# Patient Record
Sex: Female | Born: 1945 | Race: Black or African American | Hispanic: No | Marital: Married | Smoking: Never smoker
Health system: Southern US, Community
[De-identification: ages and names within clinical notes are randomized; demographics above are authoritative.]

## PROBLEM LIST (undated history)

## (undated) DIAGNOSIS — E119 Type 2 diabetes mellitus without complications: Secondary | ICD-10-CM

## (undated) DIAGNOSIS — E079 Disorder of thyroid, unspecified: Secondary | ICD-10-CM

## (undated) DIAGNOSIS — I10 Essential (primary) hypertension: Secondary | ICD-10-CM

## (undated) HISTORY — PX: CHOLECYSTECTOMY: SHX55

---

## 1999-08-18 ENCOUNTER — Encounter: Admission: RE | Admit: 1999-08-18 | Discharge: 1999-11-16 | Payer: Self-pay | Admitting: *Deleted

## 2001-02-07 ENCOUNTER — Other Ambulatory Visit: Admission: RE | Admit: 2001-02-07 | Discharge: 2001-02-07 | Payer: Self-pay | Admitting: Internal Medicine

## 2002-02-06 ENCOUNTER — Other Ambulatory Visit: Admission: RE | Admit: 2002-02-06 | Discharge: 2002-02-06 | Payer: Self-pay | Admitting: Internal Medicine

## 2003-06-13 ENCOUNTER — Ambulatory Visit (HOSPITAL_COMMUNITY): Admission: RE | Admit: 2003-06-13 | Discharge: 2003-06-13 | Payer: Self-pay | Admitting: Gastroenterology

## 2006-02-10 ENCOUNTER — Encounter: Admission: RE | Admit: 2006-02-10 | Discharge: 2006-02-10 | Payer: Self-pay | Admitting: Gastroenterology

## 2006-02-26 ENCOUNTER — Ambulatory Visit (HOSPITAL_COMMUNITY): Admission: RE | Admit: 2006-02-26 | Discharge: 2006-02-27 | Payer: Self-pay | Admitting: General Surgery

## 2006-02-26 ENCOUNTER — Encounter (INDEPENDENT_AMBULATORY_CARE_PROVIDER_SITE_OTHER): Payer: Self-pay | Admitting: *Deleted

## 2008-07-03 ENCOUNTER — Encounter (HOSPITAL_COMMUNITY): Admission: RE | Admit: 2008-07-03 | Discharge: 2008-08-02 | Payer: Self-pay | Admitting: Endocrinology

## 2008-07-10 ENCOUNTER — Ambulatory Visit (HOSPITAL_COMMUNITY): Admission: RE | Admit: 2008-07-10 | Discharge: 2008-07-10 | Payer: Self-pay | Admitting: Endocrinology

## 2011-04-03 NOTE — Op Note (Signed)
Catherine [name], [name]             ACCOUNT NO.:  [id number]   MEDICAL RECORD NO.:  [id number]          PATIENT TYPE:  OIB   LOCATION:  5731                         FACILITY:  [facility]   PHYSICIAN:  Ollen Gross. Vernell Morgans, M.D. DATE OF BIRTH:  Jun 13, 1946   DATE OF PROCEDURE:  02/26/2006  DATE OF DISCHARGE:  02/27/2006                                 OPERATIVE REPORT   PREOPERATIVE DIAGNOSIS:  Gallstones.   POSTOPERATIVE DIAGNOSIS:  Gallstones.   PROCEDURE:  Laparoscopic cholecystectomy with intraoperative cholangiogram.   SURGEON:  Ollen Gross. Carolynne Edouard, M.D.   ASSISTANT:  Leonie Man, M.D.   ANESTHESIA:  General endotracheal.   PROCEDURE:  After informed consent was obtained, the patient was brought to  the operating room and placed in a supine position on the operating table.  After adequate induction of general anesthesia, the patient's abdomen was  prepped with Betadine and draped in the usual sterile manner.  The area  below the umbilicus was infiltrated with 0.25% Marcaine with epinephrine.  A  small incision was made just below the umbilicus.  This incision was carried  down through the subcutaneous tissue bluntly with a hemostat and [employer]  retractors until the linea alba was identified.  The linea alba was incised  with a 15 blade knife and each side was grasped with Kocher clamps and  elevated anteriorly.  The preperitoneal space was then probed bluntly with a  hemostat until the peritoneum was opened and access was gained to the  abdominal cavity.  A 0 Vicryl pursestring stitch was placed in the fascia  around the opening.  The Hasson cannula was placed through the opening and  anchored in place with the previously placed Vicryl pursestring stitch.  The  abdomen was then insufflated with carbon dioxide without difficulty.  A  laparoscope was inserted through the Hasson cannula and the right upper  quadrant was inspected.  The dome of gallbladder and liver were readily  identified.  The patient was then placed in the head-up position and rotated  slightly with right side up.  Next, the epigastric region was infiltrated  with 0.25% Marcaine with epinephrine and a small incision was made with a 15  blade knife.  A 10-mm port was then placed bluntly through this incision  into the abdominal cavity under direct vision.  Sites were then chosen  lateral on the right side of the abdomen for replacement of 5-mm ports.  Each of these areas was infiltrated with 0.25% Marcaine.  Small stab  incisions were made with a 15 blade knife.  Five-millimeter ports were then  placed bluntly through these incisions into the abdominal cavity under  direct vision.  A blunt grasper was placed through the lateral-most 5-mm  port and used to grasp the dome of gallbladder and elevate it anteriorly and  superiorly.  Another blunt grasper was placed through the other 5-mm port  and used to retract on the body and neck of the gallbladder.  A dissector  was placed through the epigastric port, then using the electrocautery, the  peritoneal reflection was opened.  Blunt dissection was then  carried out in  this area until the gallbladder neck-cystic duct junction was readily  identified.  A good window was created.  A single clip was placed on the  gallbladder neck and a small ductotomy was made just below the clip.  A 14-  gauge Angiocath was then placed percutaneously through the anterior  abdominal wall under direct vision.  A Reddick cholangiogram catheter was  placed through the Angiocath and flushed.  The Reddick catheter was then  placed within the cystic duct and anchored in place with a clip.  A  cholangiogram was obtained that showed no filling defects, good emptying  into the duodenum and adequate length on the cystic duct.  The anchoring  clip and catheters were then removed from the patient.  Three clips placed  proximally on the cystic duct and the duct was divided between the  2 sets of  clips.  Posterior to this, the cystic artery was identified and again  dissected bluntly in a circumferential manner until a good window was  created.  Two clips were placed proximally and 1 distally on the artery and  the artery was divided between the 2.  Next, a laparoscopic hook cautery  device was used to separate the gallbladder from the liver bed.  Prior to  completely detaching the gallbladder from liver bed, the liver bed was  inspected and several small bleeding points were coagulated with the  electrocautery until the area was completely hemostatic.  The gallbladder  was then detached the rest of the from the liver bed without difficulty.  A  laparoscopic bag was then inserted through the epigastric port and the  gallbladder placed within the bag and bag was sealed.  The abdomen was then  irrigated with copious amounts of saline until the effluent was clear.  The  laparoscope was then moved to the epigastric port and a gallbladder grasper  was placed through the Hasson cannula and used to grasp the open end of the  bag.  The bag with the gallbladder was then removed with the Hasson cannula  through the infraumbilical port without difficulty.  The fascial defect was  closed with the previously placed Vicryl pursestring stitch as well as with  another interrupted figure-of-eight 0 Vicryl stitch.  The rest of ports were  then removed under direct vision and were found to be hemostatic.  Gas was  allowed to escape.  The skin incisions were all closed with interrupted 4-0  Monocryl subcuticular stitches.  Benzoin,  Steri-Strips and sterile  dressings were applied.  The patient tolerated the procedure well.  At the  end of the case, all needle, sponge and instrument counts were correct.  The  patient was then awakened  and taken to the recovery room in stable  condition.      Ollen Gross. Vernell Morgans, M.D.  Electronically Signed    PST/MEDQ  D:  02/28/2006  T:  03/01/2006   Job:  [id number]

## 2011-04-03 NOTE — Op Note (Signed)
Catherine [name], Catherine [name]                         ACCOUNT NO.:  [id number]   MEDICAL RECORD NO.:  [id number]                   PATIENT TYPE:  AMB   LOCATION:  ENDO                                 FACILITY:  [facility]   PHYSICIAN:  Anselmo Rod, M.D.               DATE OF BIRTH:  July 27, 1946   DATE OF PROCEDURE:  06/13/2003  DATE OF DISCHARGE:                                 OPERATIVE REPORT   PROCEDURE PERFORMED:  Screening colonoscopy.   ENDOSCOPIST:  Charna Elizabeth, M.D.   INSTRUMENT USED:  Olympus video colonoscope.   INDICATIONS FOR PROCEDURE:  The patient is a 65 year old African-American  female with guaiac positive stools, longstanding history of constipation.  Rule out colonic polyps, masses, etc.   PREPROCEDURE PREPARATION:  Informed consent was procured from the patient.  The patient was fasted for eight hours prior to the procedure and prepped  with a bottle of magnesium citrate and a gallon of GoLYTELY the night prior  to the procedure.   PREPROCEDURE PHYSICAL:  The patient had stable vital signs.  Neck supple.  Chest clear to auscultation.  S1 and S2 regular.  Abdomen soft with normal  bowel sounds.   DESCRIPTION OF PROCEDURE:  The patient was placed in left lateral decubitus  position and sedated with 50 mg of Demerol and 5 mg of Versed intravenously.  Once the patient was adequately sedated and maintained on low flow oxygen  and continuous cardiac monitoring, the Olympus video colonoscope was  advanced from the rectum to the cecum and terminal ileum.  At the patient  had some residual stool in the colon, small lesions could have been missed.  Prominent internal hemorrhoid was seen on retroflexion in the rectum. These  were not bleeding at the time of examination.  The rest of the colonic  mucosa up to the terminal ileum appeared normal and without lesions.  The  appendicular orifice and ileocecal valve were clearly visualized and  photographed.  No masses or polyps  were seen.  There was no evidence of  diverticulosis.  The patient tolerated the procedure well without  complications.   IMPRESSION:  Normal colonoscopy up to the terminal ileum except for  prominent nonbleeding internal hemorrhoids.   RECOMMENDATIONS:  1. A high fiber diet with liberal fluid intake has been recommended.  2. Outpatient follow-up with repeat guaiac testing in the next two weeks.  3. Repeat colorectal cancer screening in the next five years unless the     patient develops any abnormal symptoms in the interim.                                                    Anselmo Rod, M.D.    JNM/MEDQ  D:  06/13/2003  T:  06/13/2003  Job:  [id number]   cc:   Merlene Laughter. Renae Gloss, M.D.  [address]  [address]  [place]  [place] [address]  Fax: [phone]

## 2011-06-30 ENCOUNTER — Other Ambulatory Visit: Payer: Self-pay | Admitting: Radiology

## 2012-09-22 DIAGNOSIS — E890 Postprocedural hypothyroidism: Secondary | ICD-10-CM | POA: Diagnosis not present

## 2012-09-22 DIAGNOSIS — I10 Essential (primary) hypertension: Secondary | ICD-10-CM | POA: Diagnosis not present

## 2012-10-25 DIAGNOSIS — E875 Hyperkalemia: Secondary | ICD-10-CM | POA: Diagnosis not present

## 2012-10-25 DIAGNOSIS — E890 Postprocedural hypothyroidism: Secondary | ICD-10-CM | POA: Diagnosis not present

## 2012-10-25 DIAGNOSIS — I10 Essential (primary) hypertension: Secondary | ICD-10-CM | POA: Diagnosis not present

## 2012-12-15 DIAGNOSIS — Z0000 Encounter for general adult medical examination without abnormal findings: Secondary | ICD-10-CM | POA: Diagnosis not present

## 2012-12-15 DIAGNOSIS — Z79899 Other long term (current) drug therapy: Secondary | ICD-10-CM | POA: Diagnosis not present

## 2012-12-15 DIAGNOSIS — E039 Hypothyroidism, unspecified: Secondary | ICD-10-CM | POA: Diagnosis not present

## 2012-12-15 DIAGNOSIS — E119 Type 2 diabetes mellitus without complications: Secondary | ICD-10-CM | POA: Diagnosis not present

## 2012-12-15 DIAGNOSIS — I10 Essential (primary) hypertension: Secondary | ICD-10-CM | POA: Diagnosis not present

## 2012-12-15 DIAGNOSIS — E785 Hyperlipidemia, unspecified: Secondary | ICD-10-CM | POA: Diagnosis not present

## 2013-05-02 DIAGNOSIS — E890 Postprocedural hypothyroidism: Secondary | ICD-10-CM | POA: Diagnosis not present

## 2013-05-02 DIAGNOSIS — I10 Essential (primary) hypertension: Secondary | ICD-10-CM | POA: Diagnosis not present

## 2013-07-10 DIAGNOSIS — I10 Essential (primary) hypertension: Secondary | ICD-10-CM | POA: Diagnosis not present

## 2013-07-10 DIAGNOSIS — E119 Type 2 diabetes mellitus without complications: Secondary | ICD-10-CM | POA: Diagnosis not present

## 2013-07-10 DIAGNOSIS — E785 Hyperlipidemia, unspecified: Secondary | ICD-10-CM | POA: Diagnosis not present

## 2013-07-10 DIAGNOSIS — E890 Postprocedural hypothyroidism: Secondary | ICD-10-CM | POA: Insufficient documentation

## 2013-07-10 DIAGNOSIS — Z0000 Encounter for general adult medical examination without abnormal findings: Secondary | ICD-10-CM | POA: Diagnosis not present

## 2013-07-10 DIAGNOSIS — E559 Vitamin D deficiency, unspecified: Secondary | ICD-10-CM | POA: Diagnosis not present

## 2013-07-10 DIAGNOSIS — E039 Hypothyroidism, unspecified: Secondary | ICD-10-CM | POA: Diagnosis not present

## 2013-07-10 DIAGNOSIS — Z79899 Other long term (current) drug therapy: Secondary | ICD-10-CM | POA: Diagnosis not present

## 2013-07-10 DIAGNOSIS — Z1212 Encounter for screening for malignant neoplasm of rectum: Secondary | ICD-10-CM | POA: Diagnosis not present

## 2013-07-25 DIAGNOSIS — R928 Other abnormal and inconclusive findings on diagnostic imaging of breast: Secondary | ICD-10-CM | POA: Diagnosis not present

## 2013-09-25 DIAGNOSIS — I10 Essential (primary) hypertension: Secondary | ICD-10-CM | POA: Diagnosis not present

## 2013-09-25 DIAGNOSIS — E890 Postprocedural hypothyroidism: Secondary | ICD-10-CM | POA: Diagnosis not present

## 2014-01-04 DIAGNOSIS — E11329 Type 2 diabetes mellitus with mild nonproliferative diabetic retinopathy without macular edema: Secondary | ICD-10-CM | POA: Diagnosis not present

## 2014-01-04 DIAGNOSIS — IMO0001 Reserved for inherently not codable concepts without codable children: Secondary | ICD-10-CM | POA: Diagnosis not present

## 2014-03-05 DIAGNOSIS — E669 Obesity, unspecified: Secondary | ICD-10-CM

## 2014-03-05 DIAGNOSIS — E785 Hyperlipidemia, unspecified: Secondary | ICD-10-CM | POA: Diagnosis not present

## 2014-03-05 DIAGNOSIS — E890 Postprocedural hypothyroidism: Secondary | ICD-10-CM | POA: Diagnosis not present

## 2014-03-05 DIAGNOSIS — Z79899 Other long term (current) drug therapy: Secondary | ICD-10-CM | POA: Diagnosis not present

## 2014-03-05 DIAGNOSIS — I10 Essential (primary) hypertension: Secondary | ICD-10-CM | POA: Diagnosis not present

## 2014-03-05 DIAGNOSIS — E119 Type 2 diabetes mellitus without complications: Secondary | ICD-10-CM | POA: Diagnosis not present

## 2014-03-05 HISTORY — DX: Obesity, unspecified: E66.9

## 2014-05-29 DIAGNOSIS — H2510 Age-related nuclear cataract, unspecified eye: Secondary | ICD-10-CM | POA: Diagnosis not present

## 2014-07-26 DIAGNOSIS — Z1231 Encounter for screening mammogram for malignant neoplasm of breast: Secondary | ICD-10-CM | POA: Diagnosis not present

## 2014-08-06 DIAGNOSIS — E890 Postprocedural hypothyroidism: Secondary | ICD-10-CM | POA: Diagnosis not present

## 2014-08-06 DIAGNOSIS — I10 Essential (primary) hypertension: Secondary | ICD-10-CM | POA: Diagnosis not present

## 2014-08-06 DIAGNOSIS — E782 Mixed hyperlipidemia: Secondary | ICD-10-CM | POA: Diagnosis not present

## 2014-08-06 DIAGNOSIS — N183 Chronic kidney disease, stage 3 unspecified: Secondary | ICD-10-CM | POA: Diagnosis not present

## 2014-08-06 DIAGNOSIS — Z0000 Encounter for general adult medical examination without abnormal findings: Secondary | ICD-10-CM | POA: Diagnosis not present

## 2014-08-06 DIAGNOSIS — E8881 Metabolic syndrome: Secondary | ICD-10-CM | POA: Diagnosis not present

## 2014-08-06 DIAGNOSIS — R7309 Other abnormal glucose: Secondary | ICD-10-CM | POA: Diagnosis not present

## 2014-08-06 DIAGNOSIS — E039 Hypothyroidism, unspecified: Secondary | ICD-10-CM | POA: Diagnosis not present

## 2014-08-06 DIAGNOSIS — E559 Vitamin D deficiency, unspecified: Secondary | ICD-10-CM | POA: Diagnosis not present

## 2014-08-06 DIAGNOSIS — E119 Type 2 diabetes mellitus without complications: Secondary | ICD-10-CM | POA: Diagnosis not present

## 2014-08-13 ENCOUNTER — Emergency Department (HOSPITAL_COMMUNITY): Payer: Medicare Other

## 2014-08-13 ENCOUNTER — Inpatient Hospital Stay (HOSPITAL_COMMUNITY): Payer: Medicare Other

## 2014-08-13 ENCOUNTER — Inpatient Hospital Stay (HOSPITAL_COMMUNITY)
Admission: EM | Admit: 2014-08-13 | Discharge: 2014-08-16 | DRG: 193 | Disposition: A | Discharge status: Rehabilitation Facility | Payer: Medicare Other | Attending: Internal Medicine | Admitting: Internal Medicine

## 2014-08-13 ENCOUNTER — Encounter (HOSPITAL_COMMUNITY): Payer: Self-pay | Admitting: Emergency Medicine

## 2014-08-13 DIAGNOSIS — E1110 Type 2 diabetes mellitus with ketoacidosis without coma: Secondary | ICD-10-CM | POA: Diagnosis present

## 2014-08-13 DIAGNOSIS — E1129 Type 2 diabetes mellitus with other diabetic kidney complication: Secondary | ICD-10-CM | POA: Diagnosis present

## 2014-08-13 DIAGNOSIS — I10 Essential (primary) hypertension: Secondary | ICD-10-CM | POA: Diagnosis present

## 2014-08-13 DIAGNOSIS — N179 Acute kidney failure, unspecified: Secondary | ICD-10-CM | POA: Diagnosis not present

## 2014-08-13 DIAGNOSIS — G9340 Encephalopathy, unspecified: Secondary | ICD-10-CM | POA: Diagnosis not present

## 2014-08-13 DIAGNOSIS — E870 Hyperosmolality and hypernatremia: Secondary | ICD-10-CM | POA: Diagnosis present

## 2014-08-13 DIAGNOSIS — D62 Acute posthemorrhagic anemia: Secondary | ICD-10-CM | POA: Diagnosis not present

## 2014-08-13 DIAGNOSIS — R7989 Other specified abnormal findings of blood chemistry: Secondary | ICD-10-CM | POA: Diagnosis present

## 2014-08-13 DIAGNOSIS — R945 Abnormal results of liver function studies: Secondary | ICD-10-CM

## 2014-08-13 DIAGNOSIS — K7200 Acute and subacute hepatic failure without coma: Secondary | ICD-10-CM | POA: Diagnosis present

## 2014-08-13 DIAGNOSIS — E1165 Type 2 diabetes mellitus with hyperglycemia: Secondary | ICD-10-CM

## 2014-08-13 DIAGNOSIS — R0609 Other forms of dyspnea: Secondary | ICD-10-CM | POA: Diagnosis not present

## 2014-08-13 DIAGNOSIS — E1101 Type 2 diabetes mellitus with hyperosmolarity with coma: Secondary | ICD-10-CM | POA: Diagnosis not present

## 2014-08-13 DIAGNOSIS — R7301 Impaired fasting glucose: Secondary | ICD-10-CM | POA: Diagnosis not present

## 2014-08-13 DIAGNOSIS — R7309 Other abnormal glucose: Secondary | ICD-10-CM | POA: Diagnosis not present

## 2014-08-13 DIAGNOSIS — IMO0002 Reserved for concepts with insufficient information to code with codable children: Secondary | ICD-10-CM

## 2014-08-13 DIAGNOSIS — R5381 Other malaise: Secondary | ICD-10-CM | POA: Diagnosis not present

## 2014-08-13 DIAGNOSIS — E039 Hypothyroidism, unspecified: Secondary | ICD-10-CM | POA: Diagnosis present

## 2014-08-13 DIAGNOSIS — I959 Hypotension, unspecified: Secondary | ICD-10-CM | POA: Diagnosis not present

## 2014-08-13 DIAGNOSIS — M6282 Rhabdomyolysis: Secondary | ICD-10-CM | POA: Diagnosis present

## 2014-08-13 DIAGNOSIS — R059 Cough, unspecified: Secondary | ICD-10-CM | POA: Diagnosis not present

## 2014-08-13 DIAGNOSIS — R05 Cough: Secondary | ICD-10-CM | POA: Diagnosis not present

## 2014-08-13 DIAGNOSIS — E1100 Type 2 diabetes mellitus with hyperosmolarity without nonketotic hyperglycemic-hyperosmolar coma (NKHHC): Secondary | ICD-10-CM | POA: Diagnosis present

## 2014-08-13 DIAGNOSIS — E1310 Other specified diabetes mellitus with ketoacidosis without coma: Secondary | ICD-10-CM | POA: Diagnosis present

## 2014-08-13 DIAGNOSIS — I319 Disease of pericardium, unspecified: Secondary | ICD-10-CM | POA: Diagnosis not present

## 2014-08-13 DIAGNOSIS — J189 Pneumonia, unspecified organism: Principal | ICD-10-CM | POA: Diagnosis present

## 2014-08-13 DIAGNOSIS — IMO0001 Reserved for inherently not codable concepts without codable children: Secondary | ICD-10-CM

## 2014-08-13 DIAGNOSIS — R531 Weakness: Secondary | ICD-10-CM | POA: Diagnosis not present

## 2014-08-13 DIAGNOSIS — R0902 Hypoxemia: Secondary | ICD-10-CM | POA: Diagnosis not present

## 2014-08-13 DIAGNOSIS — R918 Other nonspecific abnormal finding of lung field: Secondary | ICD-10-CM | POA: Diagnosis not present

## 2014-08-13 DIAGNOSIS — R4182 Altered mental status, unspecified: Secondary | ICD-10-CM

## 2014-08-13 DIAGNOSIS — R4789 Other speech disturbances: Secondary | ICD-10-CM | POA: Diagnosis not present

## 2014-08-13 DIAGNOSIS — R0989 Other specified symptoms and signs involving the circulatory and respiratory systems: Secondary | ICD-10-CM | POA: Diagnosis not present

## 2014-08-13 HISTORY — DX: Essential (primary) hypertension: I10

## 2014-08-13 HISTORY — DX: Type 2 diabetes mellitus without complications: E11.9

## 2014-08-13 HISTORY — DX: Type 2 diabetes mellitus with ketoacidosis without coma: E11.10

## 2014-08-13 HISTORY — DX: Disorder of thyroid, unspecified: E07.9

## 2014-08-13 HISTORY — DX: Pneumonia, unspecified organism: J18.9

## 2014-08-13 LAB — CBC WITH DIFFERENTIAL/PLATELET
BASOS ABS: 0 10*3/uL (ref 0.0–0.1)
BASOS PCT: 0 % (ref 0–1)
EOS ABS: 0 10*3/uL (ref 0.0–0.7)
Eosinophils Relative: 0 % (ref 0–5)
HCT: 31.4 % — ABNORMAL LOW (ref 36.0–46.0)
HEMOGLOBIN: 10.5 g/dL — AB (ref 12.0–15.0)
LYMPHS ABS: 0.3 10*3/uL — AB (ref 0.7–4.0)
LYMPHS PCT: 3 % — AB (ref 12–46)
MCH: 29.6 pg (ref 26.0–34.0)
MCHC: 33.4 g/dL (ref 30.0–36.0)
MCV: 88.5 fL (ref 78.0–100.0)
Monocytes Absolute: 0.3 10*3/uL (ref 0.1–1.0)
Monocytes Relative: 3 % (ref 3–12)
Neutro Abs: 10.5 10*3/uL — ABNORMAL HIGH (ref 1.7–7.7)
Neutrophils Relative %: 94 % — ABNORMAL HIGH (ref 43–77)
Platelets: 310 10*3/uL (ref 150–400)
RBC: 3.55 MIL/uL — AB (ref 3.87–5.11)
RDW: 14.5 % (ref 11.5–15.5)
WBC: 11.1 10*3/uL — ABNORMAL HIGH (ref 4.0–10.5)

## 2014-08-13 LAB — CBG MONITORING, ED: GLUCOSE-CAPILLARY: 568 mg/dL — AB (ref 70–99)

## 2014-08-13 LAB — BLOOD GAS, ARTERIAL
Acid-base deficit: 5.3 mmol/L — ABNORMAL HIGH (ref 0.0–2.0)
BICARBONATE: 18.8 meq/L — AB (ref 20.0–24.0)
Drawn by: 418751
FIO2: 0.36 %
O2 SAT: 93 %
PO2 ART: 75.6 mmHg — AB (ref 80.0–100.0)
Patient temperature: 98.9
TCO2: 19.8 mmol/L (ref 0–100)
pCO2 arterial: 32.6 mmHg — ABNORMAL LOW (ref 35.0–45.0)
pH, Arterial: 7.38 (ref 7.350–7.450)

## 2014-08-13 LAB — TROPONIN I: Troponin I: <0.3 ng/mL (ref <0.30)

## 2014-08-13 LAB — COMPREHENSIVE METABOLIC PANEL
ALBUMIN: 2.7 g/dL — AB (ref 3.5–5.2)
ALK PHOS: 104 U/L (ref 39–117)
ALT: 83 U/L — AB (ref 0–35)
ANION GAP: 24 — AB (ref 5–15)
AST: 90 U/L — AB (ref 0–37)
BUN: 87 mg/dL — AB (ref 6–23)
CHLORIDE: 109 meq/L (ref 96–112)
CO2: 19 mEq/L (ref 19–32)
CREATININE: 2.68 mg/dL — AB (ref 0.50–1.10)
Calcium: 9.6 mg/dL (ref 8.4–10.5)
GFR calc Af Amer: 20 mL/min — ABNORMAL LOW (ref >90)
GFR calc non Af Amer: 17 mL/min — ABNORMAL LOW (ref >90)
GLUCOSE: 616 mg/dL — AB (ref 70–99)
Potassium: 4.5 mEq/L (ref 3.7–5.3)
Sodium: 152 mEq/L — ABNORMAL HIGH (ref 137–147)
TOTAL PROTEIN: 8.3 g/dL (ref 6.0–8.3)
Total Bilirubin: 0.3 mg/dL (ref 0.3–1.2)

## 2014-08-13 LAB — CBC
HCT: 23.4 % — ABNORMAL LOW (ref 36.0–46.0)
Hemoglobin: 8 g/dL — ABNORMAL LOW (ref 12.0–15.0)
MCH: 29.1 pg (ref 26.0–34.0)
MCHC: 34.2 g/dL (ref 30.0–36.0)
MCV: 85.1 fL (ref 78.0–100.0)
Platelets: 281 10*3/uL (ref 150–400)
RBC: 2.75 MIL/uL — ABNORMAL LOW (ref 3.87–5.11)
RDW: 14.3 % (ref 11.5–15.5)
WBC: 10.5 10*3/uL (ref 4.0–10.5)

## 2014-08-13 LAB — I-STAT TROPONIN, ED: Troponin i, poc: 0.06 ng/mL (ref 0.00–0.08)

## 2014-08-13 LAB — GLUCOSE, CAPILLARY: GLUCOSE-CAPILLARY: 537 mg/dL — AB (ref 70–99)

## 2014-08-13 LAB — I-STAT CG4 LACTIC ACID, ED: Lactic Acid, Venous: 2.11 mmol/L (ref 0.5–2.2)

## 2014-08-13 LAB — PRO B NATRIURETIC PEPTIDE: Pro B Natriuretic peptide (BNP): 320.4 pg/mL — ABNORMAL HIGH (ref 0–125)

## 2014-08-13 LAB — MRSA PCR SCREENING: MRSA BY PCR: POSITIVE — AB

## 2014-08-13 MED ORDER — POTASSIUM CHLORIDE 10 MEQ/100ML IV SOLN
10.0000 meq | INTRAVENOUS | Status: AC
  Administered 2014-08-14: 10 meq via INTRAVENOUS
  Filled 2014-08-13: qty 100

## 2014-08-13 MED ORDER — VANCOMYCIN HCL IN DEXTROSE 1-5 GM/200ML-% IV SOLN: 1000.0000 mg | INTRAVENOUS | Status: DC

## 2014-08-13 MED ORDER — DEXTROSE-NACL 5-0.45 % IV SOLN
INTRAVENOUS | Status: AC
  Administered 2014-08-14 – 2014-08-15 (×4): via INTRAVENOUS

## 2014-08-13 MED ORDER — VANCOMYCIN HCL IN DEXTROSE 1-5 GM/200ML-% IV SOLN
1000.0000 mg | Freq: Once | INTRAVENOUS | Status: AC
  Administered 2014-08-13: 1000 mg via INTRAVENOUS
  Filled 2014-08-13: qty 200

## 2014-08-13 MED ORDER — DEXTROSE 5 % IV SOLN: 1.0000 g | INTRAVENOUS | Status: DC

## 2014-08-13 MED ORDER — DEXTROSE 50 % IV SOLN: 25.0000 mL | INTRAVENOUS | Status: DC | PRN

## 2014-08-13 MED ORDER — SODIUM CHLORIDE 0.45 % IV SOLN: INTRAVENOUS | Status: DC

## 2014-08-13 MED ORDER — PIPERACILLIN-TAZOBACTAM 3.375 G IVPB 30 MIN
3.3750 g | Freq: Once | INTRAVENOUS | Status: AC
  Administered 2014-08-13: 3.375 g via INTRAVENOUS
  Filled 2014-08-13: qty 50

## 2014-08-13 MED ORDER — ACETAMINOPHEN 325 MG PO TABS
650.0000 mg | ORAL_TABLET | Freq: Once | ORAL | Status: AC
  Administered 2014-08-13: 650 mg via ORAL
  Filled 2014-08-13: qty 2

## 2014-08-13 MED ORDER — LEVOTHYROXINE SODIUM 50 MCG PO TABS
50.0000 ug | ORAL_TABLET | Freq: Every day | ORAL | Status: DC
  Administered 2014-08-14 – 2014-08-16 (×3): 50 ug via ORAL
  Filled 2014-08-13 (×4): qty 1

## 2014-08-13 MED ORDER — SODIUM CHLORIDE 0.9 % IV SOLN
INTRAVENOUS | Status: AC
  Administered 2014-08-14: 14.5 [IU]/h via INTRAVENOUS
  Filled 2014-08-13 (×2): qty 2.5

## 2014-08-13 MED ORDER — SODIUM CHLORIDE 0.9 % IV SOLN
1000.0000 mL | INTRAVENOUS | Status: DC
  Administered 2014-08-13: 500 mL via INTRAVENOUS

## 2014-08-13 MED ORDER — ENOXAPARIN SODIUM 30 MG/0.3ML ~~LOC~~ SOLN
30.0000 mg | SUBCUTANEOUS | Status: DC
  Administered 2014-08-13 – 2014-08-14 (×2): 30 mg via SUBCUTANEOUS
  Filled 2014-08-13 (×3): qty 0.3

## 2014-08-13 MED ORDER — PIPERACILLIN-TAZOBACTAM IN DEX 2-0.25 GM/50ML IV SOLN
2.2500 g | Freq: Four times a day (QID) | INTRAVENOUS | Status: DC
  Administered 2014-08-14 (×3): 2.25 g via INTRAVENOUS
  Filled 2014-08-13 (×6): qty 50

## 2014-08-13 MED ORDER — SODIUM CHLORIDE 0.9 % IV SOLN: 1000.0000 mL | INTRAVENOUS | Status: DC

## 2014-08-13 MED ORDER — SODIUM CHLORIDE 0.9 % IV SOLN: 1000.0000 mL | Freq: Once | INTRAVENOUS | Status: DC

## 2014-08-13 MED ORDER — SODIUM CHLORIDE 0.9 % IV SOLN: INTRAVENOUS | Status: DC

## 2014-08-13 MED ORDER — DEXTROSE 5 % IV SOLN: 500.0000 mg | INTRAVENOUS | Status: DC

## 2014-08-13 MED ORDER — ASPIRIN 300 MG RE SUPP
300.0000 mg | Freq: Every day | RECTAL | Status: DC
  Filled 2014-08-13 (×3): qty 1

## 2014-08-13 MED ORDER — DEXTROSE-NACL 5-0.45 % IV SOLN: INTRAVENOUS | Status: DC

## 2014-08-13 MED ORDER — SODIUM CHLORIDE 0.9 % IV BOLUS (SEPSIS)
30.0000 mL/kg | Freq: Once | INTRAVENOUS | Status: AC
  Administered 2014-08-13: 2313 mL via INTRAVENOUS

## 2014-08-13 MED ORDER — ATORVASTATIN CALCIUM 10 MG PO TABS
10.0000 mg | ORAL_TABLET | Freq: Every day | ORAL | Status: DC
  Administered 2014-08-13 – 2014-08-14 (×2): 10 mg via ORAL
  Filled 2014-08-13 (×3): qty 1

## 2014-08-13 MED ORDER — SODIUM CHLORIDE 0.9 % IV SOLN
INTRAVENOUS | Status: DC
  Administered 2014-08-13: 5.1 [IU]/h via INTRAVENOUS
  Filled 2014-08-13: qty 2.5

## 2014-08-13 MED ORDER — ENOXAPARIN SODIUM 40 MG/0.4ML ~~LOC~~ SOLN
40.0000 mg | SUBCUTANEOUS | Status: DC
Start: 2014-08-13 — End: 2014-08-13

## 2014-08-13 MED ORDER — ASPIRIN 325 MG PO TABS
325.0000 mg | ORAL_TABLET | Freq: Every day | ORAL | Status: DC
  Administered 2014-08-13 – 2014-08-15 (×3): 325 mg via ORAL
  Filled 2014-08-13 (×3): qty 1

## 2014-08-13 NOTE — ED Provider Notes (Signed)
CSN: [id number]     Arrival date & time 08/13/14  1721 History   First MD Initiated Contact with Patient 08/13/14 1746     Chief Complaint  Patient presents with  . Fatigue     (Consider location/radiation/quality/duration/timing/severity/associated sxs/prior Treatment) HPI Comments: Husband reports that 8 days of progressive generalized weakness, malaise, anorexia and dry cough. She has been intermittently confused. She was seen by her PCP 7 days ago although husband does not know if she mentioned these acute symptoms. He denies any falls or recent injuries, nausea, vomiting, diarrhea. Patient denies any urinary symptoms, headache, abdominal pain. She reports feeling fatigued, and short of breath. Patient is a 68 y.o. female presenting with weakness. The history is provided by the patient and a relative. The history is limited by the condition of the patient. No language interpreter was used.  Weakness This is a new problem. The current episode started more than 1 week ago. The problem occurs constantly. The problem has been gradually worsening. Associated symptoms include shortness of breath. Pertinent negatives include no chest pain, no abdominal pain and no headaches. Associated symptoms comments: Cough, SOB, anorexia, confusion. Nothing aggravates the symptoms. Nothing relieves the symptoms. She has tried nothing for the symptoms. The treatment provided no relief.    Past Medical History  Diagnosis Date  . Diabetes mellitus without complication   . Hypertension   . Thyroid disease    History reviewed. No pertinent past surgical history. No family history on file. History  Substance Use Topics  . Smoking status: Never Smoker   . Smokeless tobacco: Not on file  . Alcohol Use: No   OB History   Grav Para Term Preterm Abortions TAB SAB Ect Mult Living                 Review of Systems  Constitutional: Positive for chills, activity change, appetite change and fatigue. Negative for  fever and diaphoresis.  HENT: Negative for congestion, facial swelling, rhinorrhea and sore throat.   Eyes: Negative for photophobia and discharge.  Respiratory: Positive for cough and shortness of breath. Negative for chest tightness.   Cardiovascular: Negative for chest pain, palpitations and leg swelling.  Gastrointestinal: Negative for nausea, vomiting, abdominal pain and diarrhea.  Endocrine: Negative for polydipsia and polyuria.  Genitourinary: Negative for dysuria, frequency, difficulty urinating and pelvic pain.  Musculoskeletal: Negative for arthralgias, back pain, neck pain and neck stiffness.  Skin: Negative for color change and wound.  Allergic/Immunologic: Negative for immunocompromised state.  Neurological: Negative for facial asymmetry, weakness, numbness and headaches.  Hematological: Does not bruise/bleed easily.  Psychiatric/Behavioral: Positive for confusion. Negative for agitation.      Allergies  Review of patient's allergies indicates no known allergies.  Home Medications   Prior to Admission medications   Medication Sig Start Date End Date Taking? Authorizing Provider  aspirin 81 MG tablet Take 81 mg by mouth daily.   Yes Historical Provider, MD  atorvastatin (LIPITOR) 10 MG tablet Take 10 mg by mouth daily.   Yes Historical Provider, MD  beta carotene w/minerals (OCUVITE) tablet Take 1 tablet by mouth daily.   Yes Historical Provider, MD  Cyanocobalamin (VITAMIN B 12 PO) Take 1,000 mg by mouth daily.   Yes Historical Provider, MD  levothyroxine (SYNTHROID, LEVOTHROID) 50 MCG tablet Take 50 mcg by mouth daily before breakfast.   Yes Historical Provider, MD  lisinopril-hydrochlorothiazide (PRINZIDE,ZESTORETIC) 20-12.5 MG per tablet Take 1 tablet by mouth daily.   Yes Historical Provider, MD  Saxagliptin-Metformin (KOMBIGLYZE XR) 2.03-999 MG TB24 Take 1 tablet by mouth daily.   Yes Historical Provider, MD   BP 141/52  Pulse 93  Temp(Src) 101.3 F (38.5 C)  (Rectal)  Resp 18  Ht  (1.626 m)  Wt 170 lb (77.111 kg)  BMI 29.17 kg/m2  SpO2 96% Physical Exam  Constitutional: She appears well-developed and well-nourished. She appears listless. No distress.  HENT:  Head: Normocephalic and atraumatic.  Mouth/Throat: No oropharyngeal exudate.  Eyes: Pupils are equal, round, and reactive to light.  Neck: Normal range of motion. Neck supple.  Cardiovascular: Regular rhythm and normal heart sounds.  Tachycardia present.  Exam reveals no gallop and no friction rub.   No murmur heard. Pulmonary/Chest: Effort normal. No respiratory distress. She has no wheezes. She has rales in the right lower field and the left lower field.  Abdominal: Soft. Bowel sounds are normal. She exhibits no distension and no mass. There is no tenderness. There is no rebound and no guarding.  Musculoskeletal: Normal range of motion. She exhibits no edema and no tenderness.  Neurological: She has normal strength. She appears listless. She displays no tremor. No cranial nerve deficit or sensory deficit. She exhibits normal muscle tone. Coordination normal. GCS eye subscore is 4. GCS motor subscore is 6.  Skin: Skin is warm and dry.  Psychiatric: She has a normal mood and affect.    ED Course  Procedures (including critical care time) Labs Review Labs Reviewed  CBC WITH DIFFERENTIAL - Abnormal; Notable for the following:    WBC 11.1 (*)    RBC 3.55 (*)    Hemoglobin 10.5 (*)    HCT 31.4 (*)    All other components within normal limits  COMPREHENSIVE METABOLIC PANEL - Abnormal; Notable for the following:    Sodium 152 (*)    Glucose, Bld 616 (*)    BUN 87 (*)    Creatinine, Ser 2.68 (*)    Albumin 2.7 (*)    AST 90 (*)    ALT 83 (*)    GFR calc non Af Amer 17 (*)    GFR calc Af Amer 20 (*)    Anion gap 24 (*)    All other components within normal limits  PRO B NATRIURETIC PEPTIDE - Abnormal; Notable for the following:    Pro B Natriuretic peptide (BNP) 320.4 (*)     All other components within normal limits  CULTURE, BLOOD (ROUTINE X 2)  CULTURE, BLOOD (ROUTINE X 2)  URINE CULTURE  URINALYSIS, ROUTINE W REFLEX MICROSCOPIC  I-STAT CG4 LACTIC ACID, ED  Rosezena Sensor, ED    Imaging Review Dg Chest Port 1 View  08/13/2014   CLINICAL DATA:  Fever and cough ; hypoxia  EXAM: PORTABLE CHEST - 1 VIEW  COMPARISON:  None.  FINDINGS: There is patchy alveolar opacity in both mid and lower lung zones. Heart is upper normal in size with pulmonary vascularity within normal limits. No effusions. No adenopathy. No bone lesions.  IMPRESSION: Bilateral mid and lower lung zone opacity. Suspect bilateral pneumonia given the appearance, although a slightly atypical appearance of congestive heart failure could present in this manner. Both entities may exist concurrently. No adenopathy apparent.   Electronically Signed   By: Bretta Bang M.D.   On: 08/13/2014 18:30     EKG Interpretation None     CRITICAL CARE Performed by: Toy Cookey, E Total critical care time: 40 mins Critical care time was exclusive of separately billable procedures and  treating other patients. Critical care was necessary to treat or prevent imminent or life-threatening deterioration. Critical care was time spent personally by me on the following activities: development of treatment plan with patient and/or surrogate as well as nursing, discussions with consultants, evaluation of patient's response to treatment, examination of patient, obtaining history from patient or surrogate, ordering and performing treatments and interventions, ordering and review of laboratory studies, ordering and review of radiographic studies, pulse oximetry and re-evaluation of patient's condition.   MDM   Final diagnoses:  Community acquired pneumonia  Altered mental status, unspecified altered mental status type  Diabetic ketoacidosis without coma associated with type 2 diabetes mellitus  Acute renal  failure, unspecified acute renal failure type  Multiorgan failure  Elevated LFTs    Pt is a 68 y.o. female with Pmhx as above who presents as code sepsis with about 8 days of malaise, generalized weakness, anorexia, and dry cough. Husband also reports some mild confusion.. On arrival patient is tachycardic, hypoxic 85% on room air, and febrile to 101.3 rectal, she is ill-appearing. Mouth and lips are dry. Abdominal exam is benign. She has mild crackles at bilateral bases. She's GCS 14, has no focal neuro findings. Broad spectrum thank and Zosyn initiated along with code sepsis protocol.   CXR w/ BL pna, Labs with multiple abnormalities including acute acute renal failure with a creatinine of 6.28, LFT abnormalities. Evidence of DKA with a glucose of 616 and an anion gap of 24. Leukocytosis of 11.1, hemoglobin of 10.5. 2L NS bolus given and pt started on glucostabilizer protocol. Suspect DKA due to acute infection and ARF due to dehydration. Spoke w/ Dr. Toniann Fail of [facility] who will admit (approx 19:30), requested CT head be added.      Toy Cookey, MD 08/13/14 [phone]

## 2014-08-13 NOTE — H&P (Signed)
[facility] History and Physical  Catherine [name]:[id number] DOB: 09-24-1946 DOA: 08/13/2014  Referring physician: ER physician. PCP: No primary provider on file. Dr. Dorothyann Peng.  Chief Complaint: Weakness.  HPI: Catherine [name] is a 68 y.o. female with history of diabetes mellitus, hypertension, hypothyroidism has been feeling increasingly weak to the point patient has been feeling difficult to walk over the last one week. Patient also has been having some nonproductive cough over the last 2 days. Denies any nausea vomiting abdominal pain diarrhea fever chills. Patient's husband who provided most of the history states that patient also was having some slurred speech last 2 days. Patient did not lose consciousness but has been quite lethargic. In the ER patient is sleepy but answers questions appropriately. Has not taken any new medications or narcotics and has not had any recent travel or sick contact as per the husband. In the ER labs reveal elevated blood sugar with anion gap with chest x-ray showing possible bilateral pneumonia. On exam patient is nonfocal except the patient is lethargic. Patient is mildly febrile. Given the patient's elevated blood sugar and the anion gap patient has been started on insulin infusion for possible DKA and antibiotics for pneumonia. Labs also reveal increased creatinine and baseline is not known. Patient is yet to give a urine sample.  Review of Systems: As presented in the history of presenting illness, rest negative.  Past Medical History  Diagnosis Date  . Diabetes mellitus without complication   . Hypertension   . Thyroid disease    Past Surgical History  Procedure Laterality Date  . Cholecystectomy     Social History:  reports that she has never smoked. She does not have any smokeless tobacco history on file. She reports that she does not drink alcohol or use illicit drugs. Where does patient live home. Can patient participate in  ADLs? Yes.  No Known Allergies  Family History:  Family History  Problem Relation Age of Onset  . Diabetes Mellitus II Mother   . Stroke Mother   . Diabetes Mellitus II Sister   . Hypertension Sister       Prior to Admission medications   Medication Sig Start Date End Date Taking? Authorizing Provider  aspirin 81 MG tablet Take 81 mg by mouth daily.   Yes Historical Provider, MD  atorvastatin (LIPITOR) 10 MG tablet Take 10 mg by mouth daily.   Yes Historical Provider, MD  beta carotene w/minerals (OCUVITE) tablet Take 1 tablet by mouth daily.   Yes Historical Provider, MD  Cyanocobalamin (VITAMIN B 12 PO) Take 1,000 mg by mouth daily.   Yes Historical Provider, MD  levothyroxine (SYNTHROID, LEVOTHROID) 50 MCG tablet Take 50 mcg by mouth daily before breakfast.   Yes Historical Provider, MD  lisinopril-hydrochlorothiazide (PRINZIDE,ZESTORETIC) 20-12.5 MG per tablet Take 1 tablet by mouth daily.   Yes Historical Provider, MD  Saxagliptin-Metformin (KOMBIGLYZE XR) 2.03-999 MG TB24 Take 1 tablet by mouth daily.   Yes Historical Provider, MD    Physical Exam: Filed Vitals:   08/13/14 1930 08/13/14 1945 08/13/14 2000 08/13/14 2025  BP: 132/53 130/49 124/46   Pulse: 93 91 87   Temp:    98.5 F (36.9 C)  TempSrc:      Resp: Height:      Weight:      SpO2: 95% 95% 94%      General:  Well-developed well-nourished.  Eyes: Anicteric no pallor.  ENT: No discharge from the  ears eyes nose mouth.  Neck: No neck rigidity. No mass felt.  Cardiovascular: S1-S2 heard.  Respiratory: No rhonchi or crepitations.  Abdomen: Soft nontender bowel sounds present.  Skin: No rash.  Musculoskeletal: No edema.  Psychiatric: Patient is lethargic.  Neurologic: Patient is lethargic but answers questions appropriately and is oriented to name and place. Moves all extremities 5 x 5. Perla positive. Tongue is midline. No facial asymmetry.  Labs on Admission:  Basic Metabolic  Panel:  Recent Labs Lab 08/13/14 1802  NA 152*  K 4.5  CL 109  CO2 19  GLUCOSE 616*  BUN 87*  CREATININE 2.68*  CALCIUM 9.6   Liver Function Tests:  Recent Labs Lab 08/13/14 1802  AST 90*  ALT 83*  ALKPHOS 104  BILITOT 0.3  PROT 8.3  ALBUMIN 2.7*   No results found for this basename: LIPASE, AMYLASE,  in the last 168 hours No results found for this basename: AMMONIA,  in the last 168 hours CBC:  Recent Labs Lab 08/13/14 1802  WBC 11.1*  NEUTROABS 10.5*  HGB 10.5*  HCT 31.4*  MCV 88.5  PLT 310   Cardiac Enzymes: No results found for this basename: CKTOTAL, CKMB, CKMBINDEX, TROPONINI,  in the last 168 hours  BNP (last 3 results)  Recent Labs  08/13/14 1802  PROBNP 320.4*   CBG:  Recent Labs Lab 08/13/14 1947  GLUCAP 568*    Radiological Exams on Admission: Ct Head Wo Contrast  08/13/2014   CLINICAL DATA:  Hypotensive, general malaise, tachycardia, hyperglycemic  EXAM: CT HEAD WITHOUT CONTRAST  TECHNIQUE: Contiguous axial images were obtained from the base of the skull through the vertex without intravenous contrast.  COMPARISON:  None.  FINDINGS: No evidence of parenchymal hemorrhage or extra-axial fluid collection. No mass lesion, mass effect, or midline shift.  No CT evidence of acute infarction.  Cerebral volume is within normal limits.  No ventriculomegaly.  Partial opacification of the right frontal and ethmoid sinuses. The mastoid air cells are unopacified.  No evidence of calvarial fracture.  IMPRESSION: No evidence of acute intracranial abnormality.   Electronically Signed   By: Charline Bills M.D.   On: 08/13/2014 20:29   Dg Chest Port 1 View  08/13/2014   CLINICAL DATA:  Fever and cough ; hypoxia  EXAM: PORTABLE CHEST - 1 VIEW  COMPARISON:  None.  FINDINGS: There is patchy alveolar opacity in both mid and lower lung zones. Heart is upper normal in size with pulmonary vascularity within normal limits. No effusions. No adenopathy. No bone  lesions.  IMPRESSION: Bilateral mid and lower lung zone opacity. Suspect bilateral pneumonia given the appearance, although a slightly atypical appearance of congestive heart failure could present in this manner. Both entities may exist concurrently. No adenopathy apparent.   Electronically Signed   By: Bretta Bang M.D.   On: 08/13/2014 18:30    EKG: Independently reviewed. Normal sinus rhythm.  Assessment/Plan Active Problems:   Pneumonia   DKA, type 2   Acute renal failure   Hypernatremia   Elevated LFTs   Unspecified hypothyroidism   DKA (diabetic ketoacidoses)   1. Uncontrolled diabetes mellitus type 2 with possible diabetic ketoacidosis - patient has been placed on insulin infusion with IV fluids. Closely follow metabolic panel and once patient's anion gap is corrected change to long-acting insulin. Check hemoglobin A1c. 2. Acute encephalopathy - could be from possible infectious process versus metabolic reasons including uncontrolled diabetes and renal failure.. Patient does not have any signs of  meningismus. At this time patient has been placed on him take antibiotics for possible pneumonia. Check urine drug screen and ammonia levels. Since patient's husband was complaining of slurred speech CT head was done which was negative we'll get MRI of the brain to rule out stroke. Patient has been placed on neurochecks and swallow evaluation. Closely observe in step down. Check ABG for any carbon dioxide retention. 3. Possible pneumonia - patient was mildly febrile on admission. Blood cultures have been obtained. Chest x-ray shows bilateral opacity. There is also concern for possible CHF. But at this time patient looks dry clinically and we will continue with antibiotics for now and check urine strep antigen and Legionella antigen and procalcitonin levels. 4. Renal failure probably acute - check urinalysis and FeNa. Hold ACE inhibitors and diuretics and gently hydrate. 5. Hypernatremia -  probably from dehydration. Continue gentle hydration and closely follow metabolic panel.  6. Hypothyroidism - check TSH and continue Synthroid. 7. Hypertension - we will hold off lisinopril and diuretics due to renal failure. 8. Anemia - check anemia panel and stool for occult blood and follow CBC. 9. Elevated LFTs - be secondary to infectious process. Check acute hepatitis panel and Tylenol levels and closely follow LFTs.    Code Status: Full code.  Family Communication: Patient's husband at the bedside.  Disposition Plan: Admit to inpatient.    Kalli Greenfield N. [facility] Pager [phone].  If 7PM-7AM, please contact night-coverage [email or web address] Password [facility] 08/13/2014, 9:01 PM

## 2014-08-13 NOTE — ED Notes (Signed)
Per EMS pt from home called out for general malaise. Upon arrival pt. Hypotensive, tachycardic & Hyperglycemic. Pt denies pain. Pt endorses decreased appetite over the past week. Pt has been progressively symptomatic. Seen at PCP last week

## 2014-08-13 NOTE — Progress Notes (Signed)
ANTIBIOTIC CONSULT NOTE - INITIAL  Pharmacy Consult for vancomycin and zosyn Indication: rule out sepsis  No Known Allergies  Patient Measurements: Height:  (162.6 cm) Weight: 170 lb (77.111 kg) IBW/kg (Calculated) : 54.7   Vital Signs: Temp: 101.3 F (38.5 C) (09/28 1750) Temp src: Rectal (09/28 1750) BP: 113/46 mmHg (09/28 1730) Pulse Rate: 104 (09/28 1730) Intake/Output from previous day:   Intake/Output from this shift:    Labs: No results found for this basename: WBC, HGB, PLT, LABCREA, CREATININE,  in the last 72 hours CrCl is unknown because no creatinine reading has been taken. No results found for this basename: VANCOTROUGH, VANCOPEAK, VANCORANDOM, GENTTROUGH, GENTPEAK, GENTRANDOM, TOBRATROUGH, TOBRAPEAK, TOBRARND, AMIKACINPEAK, AMIKACINTROU, AMIKACIN,  in the last 72 hours   Microbiology: No results found for this or any previous visit (from the past 720 hour(s)).  Medical History: Past Medical History  Diagnosis Date  . Diabetes mellitus without complication   . Hypertension   . Thyroid disease     Medications:  See med rec   Assessment: Patient is a 68 y.o F presented to the ED with c/o malaise and now hypotensive. To start broad abx for r/o sepsis. Scr 0.68 (crcl~20). Patient received vancomycin 1gm IV x1 at 1825 and zosyn 3.375 gm IV at 1836 in the ED.     Goal of Therapy:  Vancomycin trough level 15-20 mcg/ml  Plan:  1) zosyn 2.25gm IV q6h 2) vancomycin 1gm IV q48h 3) f/u renal function and adjust abx if/when appropriate  Lekisha Mcghee P 08/13/2014,6:10 PM

## 2014-08-14 ENCOUNTER — Inpatient Hospital Stay (HOSPITAL_COMMUNITY): Payer: Medicare Other

## 2014-08-14 DIAGNOSIS — R0989 Other specified symptoms and signs involving the circulatory and respiratory systems: Secondary | ICD-10-CM

## 2014-08-14 DIAGNOSIS — I319 Disease of pericardium, unspecified: Secondary | ICD-10-CM

## 2014-08-14 DIAGNOSIS — E1101 Type 2 diabetes mellitus with hyperosmolarity with coma: Secondary | ICD-10-CM

## 2014-08-14 DIAGNOSIS — G9340 Encephalopathy, unspecified: Secondary | ICD-10-CM

## 2014-08-14 DIAGNOSIS — E039 Hypothyroidism, unspecified: Secondary | ICD-10-CM

## 2014-08-14 DIAGNOSIS — IMO0001 Reserved for inherently not codable concepts without codable children: Secondary | ICD-10-CM

## 2014-08-14 DIAGNOSIS — E1165 Type 2 diabetes mellitus with hyperglycemia: Secondary | ICD-10-CM

## 2014-08-14 DIAGNOSIS — R0609 Other forms of dyspnea: Secondary | ICD-10-CM

## 2014-08-14 LAB — CBC WITH DIFFERENTIAL/PLATELET
Basophils Absolute: 0 10*3/uL (ref 0.0–0.1)
Basophils Relative: 0 % (ref 0–1)
EOS PCT: 0 % (ref 0–5)
Eosinophils Absolute: 0 10*3/uL (ref 0.0–0.7)
HCT: 27.1 % — ABNORMAL LOW (ref 36.0–46.0)
Hemoglobin: 9.1 g/dL — ABNORMAL LOW (ref 12.0–15.0)
LYMPHS PCT: 5 % — AB (ref 12–46)
Lymphs Abs: 0.6 10*3/uL — ABNORMAL LOW (ref 0.7–4.0)
MCH: 29.3 pg (ref 26.0–34.0)
MCHC: 33.6 g/dL (ref 30.0–36.0)
MCV: 87.1 fL (ref 78.0–100.0)
Monocytes Absolute: 0.2 10*3/uL (ref 0.1–1.0)
Monocytes Relative: 2 % — ABNORMAL LOW (ref 3–12)
NEUTROS PCT: 93 % — AB (ref 43–77)
Neutro Abs: 10.9 10*3/uL — ABNORMAL HIGH (ref 1.7–7.7)
PLATELETS: 283 10*3/uL (ref 150–400)
RBC: 3.11 MIL/uL — AB (ref 3.87–5.11)
RDW: 14.4 % (ref 11.5–15.5)
WBC: 11.7 10*3/uL — AB (ref 4.0–10.5)

## 2014-08-14 LAB — BASIC METABOLIC PANEL
ANION GAP: 16 — AB (ref 5–15)
ANION GAP: 16 — AB (ref 5–15)
ANION GAP: 19 — AB (ref 5–15)
Anion gap: 14 (ref 5–15)
BUN: 40 mg/dL — ABNORMAL HIGH (ref 6–23)
BUN: 77 mg/dL — ABNORMAL HIGH (ref 6–23)
BUN: 82 mg/dL — ABNORMAL HIGH (ref 6–23)
BUN: 83 mg/dL — ABNORMAL HIGH (ref 6–23)
CALCIUM: 8.7 mg/dL (ref 8.4–10.5)
CALCIUM: 8.9 mg/dL (ref 8.4–10.5)
CHLORIDE: 115 meq/L — AB (ref 96–112)
CHLORIDE: 115 meq/L — AB (ref 96–112)
CO2: 17 mEq/L — ABNORMAL LOW (ref 19–32)
CO2: 19 mEq/L (ref 19–32)
CO2: 20 mEq/L (ref 19–32)
CO2: 20 mEq/L (ref 19–32)
CREATININE: 2.12 mg/dL — AB (ref 0.50–1.10)
CREATININE: 2.28 mg/dL — AB (ref 0.50–1.10)
CREATININE: 2.3 mg/dL — AB (ref 0.50–1.10)
Calcium: 8.6 mg/dL (ref 8.4–10.5)
Calcium: 8.4 mg/dL (ref 8.4–10.5)
Chloride: 116 mEq/L — ABNORMAL HIGH (ref 96–112)
Chloride: 117 mEq/L — ABNORMAL HIGH (ref 96–112)
Creatinine, Ser: 1.22 mg/dL — ABNORMAL HIGH (ref 0.50–1.10)
GFR calc Af Amer: 52 mL/min — ABNORMAL LOW (ref >90)
GFR calc non Af Amer: 45 mL/min — ABNORMAL LOW (ref >90)
GFR, EST AFRICAN AMERICAN: 24 mL/min — AB (ref >90)
GFR, EST AFRICAN AMERICAN: 24 mL/min — AB (ref >90)
GFR, EST AFRICAN AMERICAN: 27 mL/min — AB (ref >90)
GFR, EST NON AFRICAN AMERICAN: 21 mL/min — AB (ref >90)
GFR, EST NON AFRICAN AMERICAN: 21 mL/min — AB (ref >90)
GFR, EST NON AFRICAN AMERICAN: 23 mL/min — AB (ref >90)
Glucose, Bld: 231 mg/dL — ABNORMAL HIGH (ref 70–99)
Glucose, Bld: 271 mg/dL — ABNORMAL HIGH (ref 70–99)
Glucose, Bld: 347 mg/dL — ABNORMAL HIGH (ref 70–99)
Glucose, Bld: 514 mg/dL — ABNORMAL HIGH (ref 70–99)
Potassium: 3.2 mEq/L — ABNORMAL LOW (ref 3.7–5.3)
Potassium: 3.5 mEq/L — ABNORMAL LOW (ref 3.7–5.3)
Potassium: 3.7 mEq/L (ref 3.7–5.3)
Potassium: 3.7 mEq/L (ref 3.7–5.3)
Sodium: 150 mEq/L — ABNORMAL HIGH (ref 137–147)
Sodium: 151 mEq/L — ABNORMAL HIGH (ref 137–147)
Sodium: 151 mEq/L — ABNORMAL HIGH (ref 137–147)
Sodium: 152 mEq/L — ABNORMAL HIGH (ref 137–147)

## 2014-08-14 LAB — URINALYSIS, ROUTINE W REFLEX MICROSCOPIC
Bilirubin Urine: NEGATIVE
Glucose, UA: 500 mg/dL — AB
Ketones, ur: NEGATIVE mg/dL
Nitrite: NEGATIVE
PH: 5 (ref 5.0–8.0)
Protein, ur: NEGATIVE mg/dL
SPECIFIC GRAVITY, URINE: 1.017 (ref 1.005–1.030)
Urobilinogen, UA: 0.2 mg/dL (ref 0.0–1.0)

## 2014-08-14 LAB — COMPREHENSIVE METABOLIC PANEL
ALBUMIN: 2.2 g/dL — AB (ref 3.5–5.2)
ALT: 73 U/L — ABNORMAL HIGH (ref 0–35)
ANION GAP: 15 (ref 5–15)
AST: 95 U/L — ABNORMAL HIGH (ref 0–37)
Alkaline Phosphatase: 86 U/L (ref 39–117)
BILIRUBIN TOTAL: 0.3 mg/dL (ref 0.3–1.2)
BUN: 55 mg/dL — AB (ref 6–23)
CALCIUM: 9 mg/dL (ref 8.4–10.5)
CHLORIDE: 115 meq/L — AB (ref 96–112)
CO2: 22 mEq/L (ref 19–32)
CREATININE: 1.5 mg/dL — AB (ref 0.50–1.10)
GFR calc Af Amer: 40 mL/min — ABNORMAL LOW (ref >90)
GFR, EST NON AFRICAN AMERICAN: 35 mL/min — AB (ref >90)
Glucose, Bld: 147 mg/dL — ABNORMAL HIGH (ref 70–99)
Potassium: 3.5 mEq/L — ABNORMAL LOW (ref 3.7–5.3)
Sodium: 152 mEq/L — ABNORMAL HIGH (ref 137–147)
Total Protein: 7.2 g/dL (ref 6.0–8.3)

## 2014-08-14 LAB — INFLUENZA PANEL BY PCR (TYPE A & B)
H1N1FLUPCR: NOT DETECTED
Influenza A By PCR: NEGATIVE
Influenza B By PCR: NEGATIVE

## 2014-08-14 LAB — GLUCOSE, CAPILLARY
GLUCOSE-CAPILLARY: 426 mg/dL — AB (ref 70–99)
GLUCOSE-CAPILLARY: 370 mg/dL — AB (ref 70–99)
GLUCOSE-CAPILLARY: 144 mg/dL — AB (ref 70–99)
GLUCOSE-CAPILLARY: 177 mg/dL — AB (ref 70–99)
GLUCOSE-CAPILLARY: 213 mg/dL — AB (ref 70–99)
GLUCOSE-CAPILLARY: 217 mg/dL — AB (ref 70–99)
GLUCOSE-CAPILLARY: 184 mg/dL — AB (ref 70–99)
Glucose-Capillary: 280 mg/dL — ABNORMAL HIGH (ref 70–99)
Glucose-Capillary: 339 mg/dL — ABNORMAL HIGH (ref 70–99)
Glucose-Capillary: 192 mg/dL — ABNORMAL HIGH (ref 70–99)
Glucose-Capillary: 218 mg/dL — ABNORMAL HIGH (ref 70–99)
Glucose-Capillary: 128 mg/dL — ABNORMAL HIGH (ref 70–99)
Glucose-Capillary: 138 mg/dL — ABNORMAL HIGH (ref 70–99)
Glucose-Capillary: 154 mg/dL — ABNORMAL HIGH (ref 70–99)
Glucose-Capillary: 211 mg/dL — ABNORMAL HIGH (ref 70–99)
Glucose-Capillary: 156 mg/dL — ABNORMAL HIGH (ref 70–99)
Glucose-Capillary: 170 mg/dL — ABNORMAL HIGH (ref 70–99)
Glucose-Capillary: 214 mg/dL — ABNORMAL HIGH (ref 70–99)
Glucose-Capillary: 116 mg/dL — ABNORMAL HIGH (ref 70–99)
Glucose-Capillary: 117 mg/dL — ABNORMAL HIGH (ref 70–99)
Glucose-Capillary: 143 mg/dL — ABNORMAL HIGH (ref 70–99)

## 2014-08-14 LAB — URINE MICROSCOPIC-ADD ON

## 2014-08-14 LAB — RAPID URINE DRUG SCREEN, HOSP PERFORMED
AMPHETAMINES: NOT DETECTED
Barbiturates: NOT DETECTED
Benzodiazepines: NOT DETECTED
COCAINE: NOT DETECTED
OPIATES: NOT DETECTED
Tetrahydrocannabinol: NOT DETECTED

## 2014-08-14 LAB — KETONES, QUALITATIVE: Acetone, Bld: NEGATIVE

## 2014-08-14 LAB — PROCALCITONIN: PROCALCITONIN: 0.26 ng/mL

## 2014-08-14 LAB — MAGNESIUM: MAGNESIUM: 2.6 mg/dL — AB (ref 1.5–2.5)

## 2014-08-14 LAB — CK: Total CK: 4435 U/L — ABNORMAL HIGH (ref 7–177)

## 2014-08-14 LAB — FOLATE: FOLATE: 19.6 ng/mL

## 2014-08-14 LAB — TROPONIN I
Troponin I: <0.3 ng/mL (ref <0.30)
Troponin I: <0.3 ng/mL (ref <0.30)

## 2014-08-14 LAB — HEMOGLOBIN A1C
Hgb A1c MFr Bld: 7.5 % — ABNORMAL HIGH (ref <5.7)
Mean Plasma Glucose: 169 mg/dL — ABNORMAL HIGH (ref <117)

## 2014-08-14 LAB — TSH: TSH: 1.09 u[IU]/mL (ref 0.350–4.500)

## 2014-08-14 LAB — RETICULOCYTES
RBC.: 2.86 MIL/uL — ABNORMAL LOW (ref 3.87–5.11)
RETIC COUNT ABSOLUTE: 28.6 10*3/uL (ref 19.0–186.0)
Retic Ct Pct: 1 % (ref 0.4–3.1)

## 2014-08-14 LAB — IRON AND TIBC: UIBC: 149 ug/dL (ref 125–400)

## 2014-08-14 LAB — LIPID PANEL
Cholesterol: 99 mg/dL (ref 0–200)
HDL: 7 mg/dL — ABNORMAL LOW (ref >39)
LDL CALC: 48 mg/dL (ref 0–99)
Total CHOL/HDL Ratio: 14.1 RATIO
Triglycerides: 219 mg/dL — ABNORMAL HIGH (ref <150)
VLDL: 44 mg/dL — AB (ref 0–40)

## 2014-08-14 LAB — HEPATITIS PANEL, ACUTE
HCV Ab: NEGATIVE
HEP B S AG: NEGATIVE
Hep A IgM: NONREACTIVE
Hep B C IgM: NONREACTIVE

## 2014-08-14 LAB — CREATININE, URINE, RANDOM: CREATININE, URINE: 74.62 mg/dL

## 2014-08-14 LAB — SODIUM, URINE, RANDOM: Sodium, Ur: 21 mEq/L

## 2014-08-14 LAB — VITAMIN B12: Vitamin B-12: >2000 pg/mL — ABNORMAL HIGH (ref 211–911)

## 2014-08-14 LAB — STREP PNEUMONIAE URINARY ANTIGEN: Strep Pneumo Urinary Antigen: NEGATIVE

## 2014-08-14 LAB — AMMONIA: Ammonia: 89 umol/L — ABNORMAL HIGH (ref 11–60)

## 2014-08-14 LAB — FERRITIN: Ferritin: 1217 ng/mL — ABNORMAL HIGH (ref 10–291)

## 2014-08-14 MED ORDER — VANCOMYCIN HCL IN DEXTROSE 750-5 MG/150ML-% IV SOLN
750.0000 mg | INTRAVENOUS | Status: DC
Start: 2014-08-14 — End: 2014-08-15
  Administered 2014-08-14: 750 mg via INTRAVENOUS
  Filled 2014-08-14: qty 150

## 2014-08-14 MED ORDER — INFLUENZA VAC SPLIT QUAD 0.5 ML IM SUSY
0.5000 mL | PREFILLED_SYRINGE | INTRAMUSCULAR | Status: AC
  Administered 2014-08-16: 0.5 mL via INTRAMUSCULAR
  Filled 2014-08-14 (×2): qty 0.5

## 2014-08-14 MED ORDER — METHYLPREDNISOLONE SODIUM SUCC 125 MG IJ SOLR
60.0000 mg | INTRAMUSCULAR | Status: DC
  Administered 2014-08-14: 60 mg via INTRAVENOUS
  Filled 2014-08-14: qty 0.96
  Filled 2014-08-14: qty 2

## 2014-08-14 MED ORDER — IPRATROPIUM-ALBUTEROL 0.5-2.5 (3) MG/3ML IN SOLN
3.0000 mL | Freq: Four times a day (QID) | RESPIRATORY_TRACT | Status: DC
  Administered 2014-08-14 – 2014-08-15 (×3): 3 mL via RESPIRATORY_TRACT
  Filled 2014-08-14 (×3): qty 3

## 2014-08-14 MED ORDER — DM-GUAIFENESIN ER 30-600 MG PO TB12
1.0000 | ORAL_TABLET | Freq: Two times a day (BID) | ORAL | Status: DC
  Administered 2014-08-14 – 2014-08-16 (×4): 1 via ORAL
  Filled 2014-08-14 (×6): qty 1

## 2014-08-14 MED ORDER — MUPIROCIN 2 % EX OINT
1.0000 "application " | TOPICAL_OINTMENT | Freq: Two times a day (BID) | CUTANEOUS | Status: DC
  Administered 2014-08-14 – 2014-08-16 (×6): 1 via NASAL
  Filled 2014-08-14 (×2): qty 22

## 2014-08-14 MED ORDER — DEXTROSE 5 % IV SOLN: INTRAVENOUS | Status: DC

## 2014-08-14 MED ORDER — CHLORHEXIDINE GLUCONATE CLOTH 2 % EX PADS
6.0000 | MEDICATED_PAD | Freq: Every day | CUTANEOUS | Status: DC
  Administered 2014-08-14 – 2014-08-15 (×2): 6 via TOPICAL

## 2014-08-14 MED ORDER — PIPERACILLIN-TAZOBACTAM 3.375 G IVPB
3.3750 g | Freq: Three times a day (TID) | INTRAVENOUS | Status: DC
  Administered 2014-08-14 – 2014-08-15 (×3): 3.375 g via INTRAVENOUS
  Filled 2014-08-14 (×5): qty 50

## 2014-08-14 NOTE — Progress Notes (Addendum)
Inpatient Diabetes Program Recommendations  AACE/ADA: New Consensus Statement on Inpatient Glycemic Control (2013)  Target Ranges:  Prepandial:   less than 140 mg/dL      Peak postprandial:   less than 180 mg/dL (1-2 hours)      Critically ill patients:  140 - 180 mg/dL   Inpatient Diabetes Program Recommendations HgbA1C: order to assess prehospital glucose control  Already ordered. Will follow. Thank you  Piedad ClimesGina Bailen Geffre BSN, RN,CDE Inpatient Diabetes Coordinator [phone] (team pager)

## 2014-08-14 NOTE — Evaluation (Signed)
Physical Therapy Evaluation Patient Details Name: Catherine [name] MRN: [id number] DOB: [birth year] Today's Date: 08/14/2014   History of Present Illness  Pt is a 68 y.o. female with history of DM, HTN, hypothyroidism has been feeling increasingly weak to the point patient has been feeling difficult to walk over the last one week PTA. Patient also has been having some nonproductive cough over the last 2 days PTA. Patient's husband who provided most of the history in ED states that patient also was having some slurred speech last 2 days PTA.   In the ER labs reveal elevated blood sugar with anion gap with chest x-ray showing possible bilateral pneumonia. Pt admitted with DKA and pneumonia.   Clinical Impression  Pt admitted with the above. Pt currently with functional limitations due to the deficits listed below (see PT Problem List). At the time of PT eval pt was very delayed in responding verbally to therapist, as well as very slow and lethargic with mobility. Pt unsafe to attempt gait training at this time due to difficulty maintaining standing balance and demonstrating buckling of knees even with RW for UE support. Pt will benefit from skilled PT to increase their independence and safety with mobility to allow discharge to the venue listed below.       Follow Up Recommendations CIR;Supervision/Assistance - 24 hour    Equipment Recommendations  Rolling walker with 5" wheels;3in1 (PT)    Recommendations for Other Services Rehab consult     Precautions / Restrictions Precautions Precautions: Fall Restrictions Weight Bearing Restrictions: No      Mobility  Bed Mobility Overal bed mobility: Needs Assistance Bed Mobility: Supine to Sit     Supine to sit: Mod assist     General bed mobility comments: Frequent cueing for technique to transition to EOB. Pt was moving very slowly due to lethargy, and required assist both for trunk elevation to full sitting, and for elevation of LE's for  return to supine at end of session.   Transfers Overall transfer level: Needs assistance Equipment used: Rolling walker (2 wheeled) Transfers: Sit to/from Stand Sit to Stand: Min assist         General transfer comment: Steadying assist as pt powers-up to full standing. Pt requires increased time to achieve full standing. With pre-gait activity of marching in place, pt demonstrated buckling of bilateral knees. Appears that pt was voluntarily bending her knees in that manner at times, however first time appeared spontaneous.   Ambulation/Gait             General Gait Details: Deferred due to pt fatigue and unsteadiness.  Stairs            Wheelchair Mobility    Modified Rankin (Stroke Patients Only)       Balance Overall balance assessment: Needs assistance Sitting-balance support: Feet supported;Bilateral upper extremity supported Sitting balance-Leahy Scale: Poor Sitting balance - Comments: Required assist to maintain upright sitting posture at EOB.  Postural control: Posterior lean;Right lateral lean Standing balance support: Bilateral upper extremity supported Standing balance-Leahy Scale: Poor Standing balance comment: Required steadying assist at times to maintain balance with RW.                              Pertinent Vitals/Pain Pain Assessment: No/denies pain    Home Living Family/patient expects to be discharged to:: Private residence Living Arrangements: Spouse/significant other Available Help at Discharge: Family;Available 24 hours/day Type of Home: House  Home Access: Stairs to enter   Entrance Stairs-Number of Steps: 1 Home Layout: One level Home Equipment: None Additional Comments: Pt very lethargic when answering history questions. May want to clarify at another time with pt and/or family.     Prior Function Level of Independence: Independent         Comments: Per pt, states that she was independent, however does not drive  or do any of the shopping.      Hand Dominance   Dominant Hand: Right    Extremity/Trunk Assessment   Upper Extremity Assessment: Defer to OT evaluation           Lower Extremity Assessment: Generalized weakness      Cervical / Trunk Assessment: Normal  Communication   Communication: No difficulties  Cognition Arousal/Alertness: Lethargic Behavior During Therapy: Flat affect Overall Cognitive Status: No family/caregiver present to determine baseline cognitive functioning                      General Comments      Exercises        Assessment/Plan    PT Assessment Patient needs continued PT services  PT Diagnosis Difficulty walking;Abnormality of gait   PT Problem List Decreased strength;Decreased range of motion;Decreased activity tolerance;Decreased balance;Decreased mobility;Decreased knowledge of use of DME;Decreased safety awareness;Decreased knowledge of precautions  PT Treatment Interventions DME instruction;Gait training;Stair training;Functional mobility training;Therapeutic activities;Therapeutic exercise;Neuromuscular re-education;Patient/family education   PT Goals (Current goals can be found in the Care Plan section) Acute Rehab PT Goals Patient Stated Goal: Pt did not state goals during session.  PT Goal Formulation: With patient Time For Goal Achievement: 08/21/14 Potential to Achieve Goals: Good    Frequency Min 3X/week   Barriers to discharge        Co-evaluation               End of Session Equipment Utilized During Treatment: Gait belt;Oxygen Activity Tolerance: Patient limited by lethargy Patient left: in bed;with call bell/phone within reach Nurse Communication: Mobility status         Time: 4098-1191 PT Time Calculation (min): 29 min   Charges:   PT Evaluation $Initial PT Evaluation Tier I: 1 Procedure PT Treatments $Gait Training: 8-22 mins $Therapeutic Activity: 8-22 mins   PT G Codes:           Ruthann Cancer 08/14/2014, 12:15 PM  Ruthann Cancer, PT, DPT Acute Rehabilitation Services Pager: [phone]

## 2014-08-14 NOTE — Progress Notes (Signed)
ANTIBIOTIC CONSULT NOTE - FOLLOW UP  Pharmacy Consult for zosyn, vancomycin Indication: PNA  No Known Allergies  Patient Measurements: Height: 5\' 5"  (165.1 cm) Weight: 165 lb 2 oz (74.9 kg) IBW/kg (Calculated) : 57   Vital Signs: Temp: 98.2 F (36.8 C) (09/29 1200) Temp src: Oral (09/29 1200) BP: 111/63 mmHg (09/29 1200) Pulse Rate: 74 (09/29 1300) Intake/Output from previous day: 09/28 0701 - 09/29 0700 In: 984.4 [I.V.:984.4] Out: 700 [Urine:700] Intake/Output from this shift: Total I/O In: 740.1 [I.V.:640.1; IV Piggyback:100] Out: 700 [Urine:700]  Labs:  Recent Labs  08/13/14 1802 08/13/14 2207 08/14/14 0056 08/14/14 0220 08/14/14 0632  WBC 11.1* 10.5  --  11.7*  --   HGB 10.5* 8.0*  --  9.1*  --   PLT 310 281  --  283  --   LABCREA  --   --   --   --  74.62  CREATININE 2.68* 2.30* 2.28* 2.12*  --    Estimated Creatinine Clearance: 26.1 ml/min (by C-G formula based on Cr of 2.12). No results found for this basename: VANCOTROUGH, Leodis Binet, VANCORANDOM, GENTTROUGH, GENTPEAK, GENTRANDOM, TOBRATROUGH, TOBRAPEAK, TOBRARND, AMIKACINPEAK, AMIKACINTROU, AMIKACIN,  in the last 72 hours   Microbiology: Recent Results (from the past 720 hour(s))  CULTURE, BLOOD (ROUTINE X 2)     Status: None   Collection Time    08/13/14  6:15 PM      Result Value Ref Range Status   Specimen Description BLOOD ARM RIGHT   Final   Special Requests BOTTLES DRAWN AEROBIC AND ANAEROBIC 10CC   Final   Culture  Setup Time     Final   Value: 08/13/2014 22:09     Performed at [facility]   Culture     Final   Value:        BLOOD CULTURE RECEIVED NO GROWTH TO DATE CULTURE WILL BE HELD FOR 5 DAYS BEFORE ISSUING A FINAL NEGATIVE REPORT     Performed at [facility]   Report Status PENDING   Incomplete  MRSA PCR SCREENING     Status: Abnormal   Collection Time    08/13/14  8:52 PM      Result Value Ref Range Status   MRSA by PCR POSITIVE (*) NEGATIVE Final   Comment:             The GeneXpert MRSA Assay (FDA     approved for NASAL specimens     only), is one component of a     comprehensive MRSA colonization     surveillance program. It is not     intended to diagnose MRSA     infection nor to guide or     monitor treatment for     MRSA infections.     RESULT CALLED TO, READ BACK BY AND VERIFIED WITHLoleta Rose RN 2233 08/13/14 A BROWNING    Anti-infectives   Start     Dose/Rate Route Frequency Ordered Stop   08/15/14 1800  vancomycin (VANCOCIN) IVPB 1000 mg/200 mL premix     1,000 mg 200 mL/hr over 60 Minutes Intravenous Every 48 hours 08/13/14 1914     08/14/14 0200  piperacillin-tazobactam (ZOSYN) IVPB 2.25 g     2.25 g 100 mL/hr over 30 Minutes Intravenous Every 6 hours 08/13/14 1914     08/13/14 2115  cefTRIAXone (ROCEPHIN) 1 g in dextrose 5 % 50 mL IVPB  Status:  Discontinued     1 g  100 mL/hr over 30 Minutes Intravenous Every 24 hours 08/13/14 2100 08/13/14 2103   08/13/14 2115  azithromycin (ZITHROMAX) 500 mg in dextrose 5 % 250 mL IVPB  Status:  Discontinued     500 mg 250 mL/hr over 60 Minutes Intravenous Every 24 hours 08/13/14 2100 08/13/14 2103   08/13/14 1815  piperacillin-tazobactam (ZOSYN) IVPB 3.375 g     3.375 g 100 mL/hr over 30 Minutes Intravenous  Once 08/13/14 1803 08/13/14 1908   08/13/14 1815  vancomycin (VANCOCIN) IVPB 1000 mg/200 mL premix     1,000 mg 200 mL/hr over 60 Minutes Intravenous  Once 08/13/14 1803 08/13/14 1925      Assessment: 68 yo female with acute encephalopathy with possible PNA/sepsis on vancomycin and zosyn. WBC= 11.7, tmax= 101.3, SCr= 2.12 and CrCl ~ 2.12 (baseline renal function is not clear).  9/28 vanc>> 9/28 zosyn>>  9/28 blood x2 9/29 urine  Goal of Therapy:  Vancomycin trough level 15-20 mcg/ml  Plan:  -Change zosyn to 3.375gm IV q8h -Change vancomycin to 750mh IV q24h -Will follow renal function, cultures and clinical progress  Harland Germanndrew Kebrina Friend, Pharm D 08/14/2014 2:50  PM

## 2014-08-14 NOTE — Progress Notes (Signed)
CRITICAL VALUE ALERT  Critical value received:  Blood culture (+) gram cocci in clusters  Date of notification:  08/14/14  Time of notification:  2100  Critical value read back:Yes.    Nurse who received alert: S. Jacinto ReapStowe RN  MD notified (1st page):  2045  Time of first page:  2045  MD notified (2nd page):  Time of second page:  Responding MD:  Celso SickleK. Kurby, NP  Time MD responded:  2100

## 2014-08-14 NOTE — Progress Notes (Signed)
  Echocardiogram 2D Echocardiogram has been performed.  Arvil ChacoFoster, Bronte Kropf 08/14/2014, 1:12 PM

## 2014-08-14 NOTE — Evaluation (Signed)
Speech Language Pathology Evaluation Patient Details Name: Catherine [name] MRN: [id number] DOB: [birth year] Today's Date: 08/14/2014 Time: [id number] SLP Time Calculation (min): 10 min  Problem List:  Patient Active Problem List   Diagnosis Date Noted  . Pneumonia 08/13/2014  . DKA, type 2 08/13/2014  . Acute renal failure 08/13/2014  . Hypernatremia 08/13/2014  . Elevated LFTs 08/13/2014  . Unspecified hypothyroidism 08/13/2014  . DKA (diabetic ketoacidoses) 08/13/2014   Past Medical History:  Past Medical History  Diagnosis Date  . Diabetes mellitus without complication   . Hypertension   . Thyroid disease    Past Surgical History:  Past Surgical History  Procedure Laterality Date  . Cholecystectomy     HPI:  Pt is a 68 y.o. female with history of DM, HTN, hypothyroidism has been feeling increasingly weak to the point patient has been feeling difficult to walk over the last one week PTA. Patient also has been having some nonproductive cough over the last 2 days PTA. Patient's husband who provided most of the history in ED states that patient also was having some slurred speech last 2 days PTA. In the ER labs reveal elevated blood sugar with anion gap with chest x-ray showing possible bilateral pneumonia. Pt admitted with DKA and pneumonia. CT/MRI negative for acute infarct.   Assessment / Plan / Recommendation Clinical Impression  Pt demonstrates slowed processing time and overall falt affect. She has difficulty with recall of new information as well as following one-step commands, requiring increased wait time and Min-Mod cues from SLP. She does report that her speech has returned to baseline, and is intelligible with verbal output primarily at the phrase level. Pt will benefit from SLP skilled intervention to maximize cognitive skills and functional independence.    SLP Assessment  Patient needs continued Speech Lanaguage Pathology Services    Follow Up Recommendations  Inpatient Rehab;24 hour supervision/assistance    Frequency and Duration min 2x/week  2 weeks   Pertinent Vitals/Pain Pain Assessment: No/denies pain   SLP Goals  Patient/Family Stated Goal: none stated Potential to Achieve Goals: Good Potential Considerations: Ability to learn/carryover information;Other (comment) (unclear PLOF)  SLP Evaluation Prior Functioning  Cognitive/Linguistic Baseline: Information not available Type of Home: House Available Help at Discharge: Family;Available 24 hours/day   Cognition  Overall Cognitive Status: Impaired/Different from baseline Arousal/Alertness: Awake/alert Orientation Level: Oriented to person;Oriented to place;Oriented to time;Disoriented to situation Attention: Sustained Sustained Attention: Impaired Sustained Attention Impairment: Verbal basic;Functional basic Memory: Impaired Memory Impairment: Decreased recall of new information Awareness: Impaired Awareness Impairment: Intellectual impairment Problem Solving: Impaired Problem Solving Impairment: Functional basic Safety/Judgment: Impaired    Comprehension  Auditory Comprehension Overall Auditory Comprehension: Impaired Commands: Impaired One Step Basic Commands: 75-100% accurate (slow to respond) Conversation: Simple Interfering Components: Attention;Processing speed;Working Theatre managermemory Visual Recognition/Discrimination Discrimination: Not tested Reading Comprehension Reading Status: Not tested    Expression Expression Primary Mode of Expression: Verbal Verbal Expression Overall Verbal Expression: Impaired Initiation: No impairment Level of Generative/Spontaneous Verbalization: Phrase Pragmatics: Impairment Impairments: Abnormal affect;Eye contact;Monotone Non-Verbal Means of Communication: Not applicable Written Expression Dominant Hand: Right Written Expression: Not tested   Oral / Motor Oral Motor/Sensory Function Overall Oral Motor/Sensory Function: Appears  within functional limits for tasks assessed Motor Speech Overall Motor Speech: Appears within functional limits for tasks assessed   GO      Maxcine HamLaura Paiewonsky, M.A. CCC-SLP [phone]  Maxcine Hamaiewonsky, Zelene Barga 08/14/2014, 2:03 PM

## 2014-08-14 NOTE — Progress Notes (Signed)
Utilization Review Completed.Saskia Simerson T9/29/2015  

## 2014-08-14 NOTE — Progress Notes (Signed)
SLP Cancellation Note  Patient Details Name: Governor SpeckingBarbara P Brickley MRN: [id number] DOB: August 15, 1946   Cancelled treatment:       Reason Eval/Treat Not Completed: Patient at procedure or test/unavailable. Will return as able for bedside swallow evaluation and cognitive-linguistic evaluation.    Maxcine HamLaura Paiewonsky, M.A. CCC-SLP [phone]  Maxcine Hamaiewonsky, Natahlia Hoggard 08/14/2014, 10:44 AM

## 2014-08-14 NOTE — Progress Notes (Signed)
OT Cancellation Note:  Pt currently at MRI.  Will continue to follow. 08/14/2014 Martie RoundJulie Aamna Mallozzi, OTR/L Pager: [phone]

## 2014-08-14 NOTE — Evaluation (Signed)
Clinical/Bedside Swallow Evaluation Patient Details  Name: Catherine [name] MRN: [id number] Date of Birth: 27-Jun-1946  Today's Date: 08/14/2014 Time: [id number] SLP Time Calculation (min): 10 min  Past Medical History:  Past Medical History  Diagnosis Date  . Diabetes mellitus without complication   . Hypertension   . Thyroid disease    Past Surgical History:  Past Surgical History  Procedure Laterality Date  . Cholecystectomy     HPI:  Pt is a 68 y.o. female with history of DM, HTN, hypothyroidism has been feeling increasingly weak to the point patient has been feeling difficult to walk over the last one week PTA. Patient also has been having some nonproductive cough over the last 2 days PTA. Patient's husband who provided most of the history in ED states that patient also was having some slurred speech last 2 days PTA. In the ER labs reveal elevated blood sugar with anion gap with chest x-ray showing possible bilateral pneumonia. Pt admitted with DKA and pneumonia. CT/MRI negative for acute infarct.   Assessment / Plan / Recommendation Clinical Impression  Pt presents with mild oral holding with intermittent multiple swallows (question piecemeal swallows?) across consistencies tested, although with no overt signs of airway compromise identified. Vocal quality remained clear throughout trials. Recommend MD initiate Dys 3 textures and thin liquids when medically appropriate to start PO intake.    Aspiration Risk  Mild    Diet Recommendation Dysphagia 3 (Mechanical Soft);Thin liquid   Liquid Administration via: Cup;Straw Medication Administration: Whole meds with puree Supervision: Patient able to self feed;Intermittent supervision to cue for compensatory strategies Compensations: Slow rate;Small sips/bites Postural Changes and/or Swallow Maneuvers: Seated upright 90 degrees    Other  Recommendations Oral Care Recommendations: Oral care BID   Follow Up Recommendations  Inpatient  Rehab;24 hour supervision/assistance    Frequency and Duration min 2x/week  1 week   Pertinent Vitals/Pain n/a    SLP Swallow Goals     Swallow Study Prior Functional Status  Type of Home: House Available Help at Discharge: Family;Available 24 hours/day    General Date of Onset: 08/13/14 HPI: Pt is a 68 y.o. female with history of DM, HTN, hypothyroidism has been feeling increasingly weak to the point patient has been feeling difficult to walk over the last one week PTA. Patient also has been having some nonproductive cough over the last 2 days PTA. Patient's husband who provided most of the history in ED states that patient also was having some slurred speech last 2 days PTA. In the ER labs reveal elevated blood sugar with anion gap with chest x-ray showing possible bilateral pneumonia. Pt admitted with DKA and pneumonia. CT/MRI negative for acute infarct. Type of Study: Bedside swallow evaluation Previous Swallow Assessment: none in chart Diet Prior to this Study: NPO Temperature Spikes Noted: Yes Respiratory Status: Nasal cannula History of Recent Intubation: No Behavior/Cognition: Alert;Cooperative;Confused;Requires cueing;Distractible Oral Cavity - Dentition: Adequate natural dentition Self-Feeding Abilities: Able to feed self Patient Positioning: Upright in bed Baseline Vocal Quality: Clear Volitional Cough: Strong Volitional Swallow: Able to elicit    Oral/Motor/Sensory Function Overall Oral Motor/Sensory Function: Appears within functional limits for tasks assessed   Ice Chips Ice chips: Not tested   Thin Liquid Thin Liquid: Impaired Presentation: Cup;Self Fed;Straw Oral Phase Functional Implications: Oral holding Pharyngeal  Phase Impairments: Multiple swallows    Nectar Thick Nectar Thick Liquid: Not tested   Honey Thick Honey Thick Liquid: Not tested   Puree Puree: Impaired Presentation: Self Fed;Spoon Oral Phase  Functional Implications: Oral holding Pharyngeal  Phase Impairments: Multiple swallows   Solid   GO    Solid: Impaired Oral Phase Functional Implications: Oral holding        Maxcine Ham, M.A. CCC-SLP [phone]  Maxcine Ham 08/14/2014,1:50 PM

## 2014-08-14 NOTE — Progress Notes (Signed)
Rehab Admissions Coordinator Note:  Patient was screened by Trish MageLogue, Tajanay Hurley M for appropriateness for an Inpatient Acute Rehab Consult.  At this time, we are recommending Inpatient Rehab consult.  Trish MageLogue, Maxxwell Edgett M 08/14/2014, 12:30 PM  I can be reached at [phone].

## 2014-08-14 NOTE — Progress Notes (Signed)
[place] TEAM 1 - Stepdown/ICU TEAM Progress Note  Catherine [name] ZOX:[id number] DOB: May 03, 1946 DOA: 08/13/2014 PCP: No primary provider on file.  Admit HPI / Brief Narrative: Catherine [name] is a 68 y.o. BF PMHx noncompliance, diabetes mellitus type II, hypertension, hypothyroidism has been feeling increasingly weak to the point patient has been feeling difficult to walk over the last two week. Patient also has been having some nonproductive cough over the last 2 days. Denies any nausea vomiting abdominal pain diarrhea fever chills. Patient's husband who provided most of the history states that patient also was having some slurred speech last 2 days. Patient did not lose consciousness but has been quite lethargic. In the ER patient is sleepy but answers questions appropriately. Has not taken any new medications or narcotics and has not had any recent travel or sick contact as per the husband. In the ER labs reveal elevated blood sugar with anion gap with chest x-ray showing possible bilateral pneumonia. On exam patient is nonfocal except the patient is lethargic. Patient is mildly febrile. Given the patient's elevated blood sugar and the anion gap patient has been started on insulin infusion for possible DKA and antibiotics for pneumonia. Labs also reveal increased creatinine and baseline is not known. Patient is yet to give a urine sample.   HPI/Subjective: 9/29 A./O. x4, but very lethargic follows all commands  Assessment/Plan: HHNS/Uncontrolled diabetes mellitus type 2 -9/29 hemoglobin A1c= 7.5  -Continue glucose stabilizer -Increase fluids D5W-0.45% NS to 250 ml/hr -AG = 16 most likely a combination of DKA + acute failure.   Acute encephalopathy  - Most likely multifactorial to include DKA, acute renal failure patient, hypernatremia has been placed on insulin infusion with IV fluids -Patient with slurred speech on admission; CT/MRI head negative, see results below  HCAP -Continue  antibiotics -Solu-Medrol 60 mg daily -DuoNeb QID -Mucinex DM BID -Flutter valve -Blood culture pending  Renal failure probably acute  -Urine culture pending -Hold ACE inhibitors and diuretics and gently hydrate.  Hypernatremia  - D5W -0.45% NS at  250/hr.   Hypothyroidism  - check TSH and continue Synthroid.  Hypertension  - we will hold off lisinopril and diuretics due to renal failure.  Anemia  - check anemia panel and stool for occult blood and follow CBC.  Elevated LFTs  - be secondary to infectious process. Check acute hepatitis panel and Tylenol levels and closely follow LFTs.      Code Status: FULL Family Communication: no family present at time of exam Disposition Plan: Resolution of    Consultants: NA  Procedure/Significant Events: 9/28 PCXR;Bilateral mid and lower lung zone opacity. bilateral PNA vs CHF 9/28 CT head without contrast;No evidence of acute intracranial abnormality. 9/29 MRI brain without contrast;No acute intracranial abnormality.  9/29 echocardiogram;- Left ventricle: mild LVH. LVEF=  60% to 65%. - (grade 1 diastolic dysfunction).     Culture 9/29 strep pneumo negative 9/29 Legionella antigen pending   Antibiotics: Zosyn 9/29>> Vancomycin 9/29>>  DVT prophylaxis: Lovenox   Devices    LINES / TUBES:      Continuous Infusions: . sodium chloride Stopped (08/14/14 0400)  . dextrose 5 % and 0.45% NaCl 100 mL/hr at 08/14/14 0800  . insulin (NOVOLIN-R) infusion 5.9 Units/hr (08/14/14 1000)    Objective: VITAL SIGNS: Temp: 98.1 F (36.7 C) (09/29 0800) Temp src: Oral (09/29 0800) BP: 122/79 mmHg (09/29 0800) Pulse Rate: 72 (09/29 1000) SPO2; 97% on 3 L O2 via [place] FIO2:   Intake/Output Summary (  Last 24 hours) at 08/14/14 1050 Last data filed at 08/14/14 1000  Gross per 24 hour  Intake 1398.8 ml  Output    700 ml  Net  698.8 ml     Exam: General: Lethargic, A./O. x4, No acute respiratory distress Lungs:  Clear to auscultation bilaterally without wheezes or crackles Cardiovascular: Regular rate and rhythm without murmur gallop or rub normal S1 and S2 Abdomen: Nontender, nondistended, soft, bowel sounds positive, no rebound, no ascites, no appreciable mass Extremities: No significant cyanosis, clubbing, or edema bilateral lower extremities  Data Reviewed: Basic Metabolic Panel:  Recent Labs Lab 08/13/14 1802 08/13/14 2207 08/14/14 0056 08/14/14 0220  NA 152* 151* 152* 150*  K 4.5 3.7 3.5* 3.7  CL 109 115* 116* 115*  CO2 19 17* 20 19  GLUCOSE 616* 514* 347* 271*  BUN 87* 83* 82* 77*  CREATININE 2.68* 2.30* 2.28* 2.12*  CALCIUM 9.6 8.6 8.7 8.9  MG  --  2.6*  --   --    Liver Function Tests:  Recent Labs Lab 08/13/14 1802  AST 90*  ALT 83*  ALKPHOS 104  BILITOT 0.3  PROT 8.3  ALBUMIN 2.7*   No results found for this basename: LIPASE, AMYLASE,  in the last 168 hours  Recent Labs Lab 08/14/14 0056  AMMONIA 89*   CBC:  Recent Labs Lab 08/13/14 1802 08/13/14 2207 08/14/14 0220  WBC 11.1* 10.5 11.7*  NEUTROABS 10.5*  --  10.9*  HGB 10.5* 8.0* 9.1*  HCT 31.4* 23.4* 27.1*  MCV 88.5 85.1 87.1  PLT 310 281 283   Cardiac Enzymes:  Recent Labs Lab 08/13/14 2207 08/14/14 0220 08/14/14 0905  CKTOTAL 4435*  --   --   TROPONINI <0.30 <0.30 <0.30   BNP (last 3 results)  Recent Labs  08/13/14 1802  PROBNP 320.4*   CBG:  Recent Labs Lab 08/14/14 0349 08/14/14 0457 08/14/14 0600 08/14/14 0655 08/14/14 0801  GLUCAP 192* 138* 128* 144* 154*    Recent Results (from the past 240 hour(s))  CULTURE, BLOOD (ROUTINE X 2)     Status: None   Collection Time    08/13/14  6:15 PM      Result Value Ref Range Status   Specimen Description BLOOD ARM RIGHT   Final   Special Requests BOTTLES DRAWN AEROBIC AND ANAEROBIC 10CC   Final   Culture  Setup Time     Final   Value: 08/13/2014 22:09     Performed at [facility]   Culture     Final   Value:         BLOOD CULTURE RECEIVED NO GROWTH TO DATE CULTURE WILL BE HELD FOR 5 DAYS BEFORE ISSUING A FINAL NEGATIVE REPORT     Performed at [facility]   Report Status PENDING   Incomplete  MRSA PCR SCREENING     Status: Abnormal   Collection Time    08/13/14  8:52 PM      Result Value Ref Range Status   MRSA by PCR POSITIVE (*) NEGATIVE Final   Comment:            The GeneXpert MRSA Assay (FDA     approved for NASAL specimens     only), is one component of a     comprehensive MRSA colonization     surveillance program. It is not     intended to diagnose MRSA     infection nor to guide or     monitor  treatment for     MRSA infections.     RESULT CALLED TO, READ BACK BY AND VERIFIED WITHLoleta Rose RN 2233 08/13/14 A BROWNING     Studies:  Recent x-ray studies have been reviewed in detail by the Attending Physician  Scheduled Meds:  Scheduled Meds: . sodium chloride   Intravenous STAT  . aspirin  300 mg Rectal Daily   Or  . aspirin  325 mg Oral Daily  . atorvastatin  10 mg Oral QHS  . Chlorhexidine Gluconate Cloth  6 each Topical Q0600  . enoxaparin (LOVENOX) injection  30 mg Subcutaneous Q24H  . levothyroxine  50 mcg Oral QAC breakfast  . mupirocin ointment  1 application Nasal BID  . piperacillin-tazobactam (ZOSYN)  IV  2.25 g Intravenous Q6H  . [START ON 08/15/2014] vancomycin  1,000 mg Intravenous Q48H    Time spent on care of this patient: 40 mins   Drema Dallas , MD   [facility] Office  [phone] Pager - [phone]  On-Call/Text Page:      [email or web address]      password [email or web address]  If 7PM-7AM, please contact night-coverage [email or web address] Password [email or web address] 08/14/2014, 10:50 AM   LOS: 1 day

## 2014-08-14 NOTE — Evaluation (Signed)
Occupational Therapy Evaluation Patient Details Name: LADELL BEY MRN: [id number] DOB: 13-Aug-1946 Today's Date: 08/14/2014    History of Present Illness Pt is a 68 y.o. female with history of DM, HTN, hypothyroidism has been feeling increasingly weak to the point patient has been feeling difficult to walk over the last one week PTA. Patient also has been having some nonproductive cough over the last 2 days PTA. Patient's husband who provided most of the history in ED states that patient also was having some slurred speech last 2 days PTA.   In the ER labs reveal elevated blood sugar with anion gap with chest x-ray showing possible bilateral pneumonia. Pt admitted with DKA and pneumonia.    Clinical Impression   Pt was independent in ADL, IADL and driving prior to admission.  She presents with lethargy, generalized weakness, and impaired balance interfering with ability to perform ADL and ADL transfers.  Pt moves slowly and keeps her eyes closed frequently.  She allowed her husband to respond to questions for her.  Will follow acutely to address self care and transfer goal so pt may eventually return home with her husband.    Follow Up Recommendations  CIR;Supervision/Assistance - 24 hour    Equipment Recommendations  Other (comment) (to be determined)    Recommendations for Other Services       Precautions / Restrictions Precautions Precautions: Fall Precaution Comments: knees buckle Restrictions Weight Bearing Restrictions: No      Mobility Bed Mobility Overal bed mobility: Needs Assistance Bed Mobility: Supine to Sit;Sit to Supine     Supine to sit: Mod assist Sit to supine: Mod assist   General bed mobility comments: moves slowly due to lethargy, assist for LEs and to guide trunk  Transfers Overall transfer level: Needs assistance Equipment used: 1 person hand held assist Transfers: Sit to/from Stand Sit to Stand: Min assist         General transfer comment:  assist to steady, pt able to power up, extra time    Balance     Sitting balance-Leahy Scale: Fair Sitting balance - Comments: pt keeping her eyes closed and wavering in sitting, did not attempt to steady herself with her UEs Postural control: Posterior lean   Standing balance-Leahy Scale: Poor                              ADL Overall ADL's : Needs assistance/impaired Eating/Feeding: NPO   Grooming: Wash/dry face;Set up;Sitting           Upper Body Dressing : Minimal assistance;Sitting (supported sitting)   Lower Body Dressing: Sit to/from stand;Moderate assistance Lower Body Dressing Details (indicate cue type and reason): pt with minimal attempt to donn socks, appearing fatigued Toilet Transfer: Minimal assistance;Stand-pivot;BSC   Toileting- Clothing Manipulation and Hygiene: Sitting/lateral lean;Minimal assistance         General ADL Comments: Pt with generalized weakness and lethargy.     Vision                     Perception     Praxis      Pertinent Vitals/Pain Pain Assessment: No/denies pain     Hand Dominance Right   Extremity/Trunk Assessment Upper Extremity Assessment Upper Extremity Assessment: Generalized weakness (full AROM)   Lower Extremity Assessment Lower Extremity Assessment: Defer to PT evaluation   Cervical / Trunk Assessment Cervical / Trunk Assessment: Normal   Communication Communication Communication: No difficulties (  minimally communicative)   Cognition Arousal/Alertness: Lethargic Behavior During Therapy: Flat affect Overall Cognitive Status: Difficult to assess                     General Comments       Exercises       Shoulder Instructions      Home Living Family/patient expects to be discharged to:: Private residence Living Arrangements: Spouse/significant other Available Help at Discharge: Family;Available 24 hours/day Type of Home: House Home Access: Stairs to enter [employer] IndustriesEntrance  Stairs-Number of Steps: 1   Home Layout: One level     Bathroom Shower/Tub: Chief Strategy OfficerTub/shower unit   Bathroom Toilet: Standard     Home Equipment: None   Additional Comments: husband answering questions for pt, although pt capable      Prior Functioning/Environment Level of Independence: Independent        Comments: pt reports she drives and does IADL    OT Diagnosis: Generalized weakness   OT Problem List: Decreased strength;Decreased activity tolerance;Impaired balance (sitting and/or standing);Decreased cognition;Decreased knowledge of use of DME or AE   OT Treatment/Interventions: Self-care/ADL training;Therapeutic activities;Cognitive remediation/compensation;Patient/family education;Balance training;DME and/or AE instruction    OT Goals(Current goals can be found in the care plan section) Acute Rehab OT Goals Patient Stated Goal: Pt did not state goals during session.  OT Goal Formulation: With patient Time For Goal Achievement: 08/28/14 Potential to Achieve Goals: Good ADL Goals Pt Will Perform Grooming: with supervision;standing Pt Will Perform Upper Body Bathing: sitting;with supervision Pt Will Perform Lower Body Bathing: with supervision;sit to/from stand Pt Will Perform Upper Body Dressing: with supervision;sitting Pt Will Perform Lower Body Dressing: with supervision;sit to/from stand Pt Will Transfer to Toilet: with supervision;ambulating;regular height toilet Pt Will Perform Toileting - Clothing Manipulation and hygiene: with supervision;sit to/from stand Pt Will Perform Tub/Shower Transfer: with min guard assist;ambulating;Tub transfer (determine need for DME) Additional ADL Goal #1: Pt will perform bed mobility with supervision.  OT Frequency: Min 2X/week   Barriers to D/C:            Co-evaluation              End of Session    Activity Tolerance: Patient limited by fatigue;Patient limited by lethargy Patient left: in bed;with call bell/phone  within reach;with family/visitor present   Time: 1610-1636 OT Time Calculation (min): 26 min Charges:  OT General Charges $OT Visit: 1 Procedure OT Evaluation $Initial OT Evaluation Tier I: 1 Procedure OT Treatments $Self Care/Home Management : 8-22 mins G-Codes:    Evern BioMayberry, Kenzly Rogoff Lynn 08/14/2014, 4:39 PM [phone]

## 2014-08-15 LAB — COMPREHENSIVE METABOLIC PANEL
ALK PHOS: 71 U/L (ref 39–117)
ALT: 63 U/L — AB (ref 0–35)
AST: 77 U/L — AB (ref 0–37)
Albumin: 1.9 g/dL — ABNORMAL LOW (ref 3.5–5.2)
Anion gap: 13 (ref 5–15)
BUN: 36 mg/dL — ABNORMAL HIGH (ref 6–23)
CALCIUM: 8.5 mg/dL (ref 8.4–10.5)
CHLORIDE: 117 meq/L — AB (ref 96–112)
CO2: 20 meq/L (ref 19–32)
Creatinine, Ser: 1.18 mg/dL — ABNORMAL HIGH (ref 0.50–1.10)
GFR calc Af Amer: 54 mL/min — ABNORMAL LOW (ref >90)
GFR, EST NON AFRICAN AMERICAN: 47 mL/min — AB (ref >90)
GLUCOSE: 195 mg/dL — AB (ref 70–99)
POTASSIUM: 3.3 meq/L — AB (ref 3.7–5.3)
SODIUM: 150 meq/L — AB (ref 137–147)
Total Bilirubin: 0.2 mg/dL — ABNORMAL LOW (ref 0.3–1.2)
Total Protein: 6.2 g/dL (ref 6.0–8.3)

## 2014-08-15 LAB — BASIC METABOLIC PANEL
Anion gap: 12 (ref 5–15)
Anion gap: 13 (ref 5–15)
BUN: 36 mg/dL — ABNORMAL HIGH (ref 6–23)
BUN: 32 mg/dL — ABNORMAL HIGH (ref 6–23)
CHLORIDE: 117 meq/L — AB (ref 96–112)
CO2: 21 mEq/L (ref 19–32)
CO2: 20 mEq/L (ref 19–32)
CREATININE: 1.19 mg/dL — AB (ref 0.50–1.10)
Calcium: 8.5 mg/dL (ref 8.4–10.5)
Calcium: 8.4 mg/dL (ref 8.4–10.5)
Chloride: 118 mEq/L — ABNORMAL HIGH (ref 96–112)
Creatinine, Ser: 1.11 mg/dL — ABNORMAL HIGH (ref 0.50–1.10)
GFR calc Af Amer: 54 mL/min — ABNORMAL LOW (ref >90)
GFR calc Af Amer: 58 mL/min — ABNORMAL LOW (ref >90)
GFR calc non Af Amer: 46 mL/min — ABNORMAL LOW (ref >90)
GFR calc non Af Amer: 50 mL/min — ABNORMAL LOW (ref >90)
GLUCOSE: 180 mg/dL — AB (ref 70–99)
Glucose, Bld: 198 mg/dL — ABNORMAL HIGH (ref 70–99)
POTASSIUM: 3.6 meq/L — AB (ref 3.7–5.3)
Potassium: 3.3 mEq/L — ABNORMAL LOW (ref 3.7–5.3)
SODIUM: 151 meq/L — AB (ref 137–147)
SODIUM: 150 meq/L — AB (ref 137–147)

## 2014-08-15 LAB — CBC WITH DIFFERENTIAL/PLATELET
Basophils Absolute: 0 10*3/uL (ref 0.0–0.1)
Basophils Relative: 0 % (ref 0–1)
EOS PCT: 0 % (ref 0–5)
Eosinophils Absolute: 0 10*3/uL (ref 0.0–0.7)
HEMATOCRIT: 22.9 % — AB (ref 36.0–46.0)
Hemoglobin: 7.8 g/dL — ABNORMAL LOW (ref 12.0–15.0)
LYMPHS ABS: 0.3 10*3/uL — AB (ref 0.7–4.0)
Lymphocytes Relative: 4 % — ABNORMAL LOW (ref 12–46)
MCH: 29.4 pg (ref 26.0–34.0)
MCHC: 34.1 g/dL (ref 30.0–36.0)
MCV: 86.4 fL (ref 78.0–100.0)
Monocytes Absolute: 0.1 10*3/uL (ref 0.1–1.0)
Monocytes Relative: 1 % — ABNORMAL LOW (ref 3–12)
NEUTROS ABS: 7.9 10*3/uL — AB (ref 1.7–7.7)
Neutrophils Relative %: 95 % — ABNORMAL HIGH (ref 43–77)
Platelets: 256 10*3/uL (ref 150–400)
RBC: 2.65 MIL/uL — AB (ref 3.87–5.11)
RDW: 14.5 % (ref 11.5–15.5)
WBC: 8.3 10*3/uL (ref 4.0–10.5)

## 2014-08-15 LAB — CK: CK TOTAL: 1937 U/L — AB (ref 7–177)

## 2014-08-15 LAB — GLUCOSE, CAPILLARY
GLUCOSE-CAPILLARY: 194 mg/dL — AB (ref 70–99)
GLUCOSE-CAPILLARY: 180 mg/dL — AB (ref 70–99)
Glucose-Capillary: 160 mg/dL — ABNORMAL HIGH (ref 70–99)
Glucose-Capillary: 184 mg/dL — ABNORMAL HIGH (ref 70–99)
Glucose-Capillary: 202 mg/dL — ABNORMAL HIGH (ref 70–99)
Glucose-Capillary: 221 mg/dL — ABNORMAL HIGH (ref 70–99)
Glucose-Capillary: 222 mg/dL — ABNORMAL HIGH (ref 70–99)
Glucose-Capillary: 231 mg/dL — ABNORMAL HIGH (ref 70–99)
Glucose-Capillary: 170 mg/dL — ABNORMAL HIGH (ref 70–99)
Glucose-Capillary: 175 mg/dL — ABNORMAL HIGH (ref 70–99)
Glucose-Capillary: 187 mg/dL — ABNORMAL HIGH (ref 70–99)
Glucose-Capillary: 154 mg/dL — ABNORMAL HIGH (ref 70–99)
Glucose-Capillary: 160 mg/dL — ABNORMAL HIGH (ref 70–99)
Glucose-Capillary: 164 mg/dL — ABNORMAL HIGH (ref 70–99)
Glucose-Capillary: 180 mg/dL — ABNORMAL HIGH (ref 70–99)
Glucose-Capillary: 192 mg/dL — ABNORMAL HIGH (ref 70–99)
Glucose-Capillary: 302 mg/dL — ABNORMAL HIGH (ref 70–99)
Glucose-Capillary: 291 mg/dL — ABNORMAL HIGH (ref 70–99)

## 2014-08-15 LAB — URINE CULTURE
COLONY COUNT: NO GROWTH
CULTURE: NO GROWTH

## 2014-08-15 LAB — LACTIC ACID, PLASMA: Lactic Acid, Venous: 1.8 mmol/L (ref 0.5–2.2)

## 2014-08-15 LAB — CULTURE, BLOOD (ROUTINE X 2)

## 2014-08-15 LAB — LEGIONELLA ANTIGEN, URINE: Legionella Antigen, Urine: 3

## 2014-08-15 LAB — PROCALCITONIN: Procalcitonin: 0.19 ng/mL

## 2014-08-15 LAB — ACETAMINOPHEN LEVEL

## 2014-08-15 LAB — MAGNESIUM: MAGNESIUM: 2 mg/dL (ref 1.5–2.5)

## 2014-08-15 MED ORDER — ASPIRIN 81 MG PO CHEW
81.0000 mg | CHEWABLE_TABLET | Freq: Every day | ORAL | Status: DC
  Administered 2014-08-16: 81 mg via ORAL
  Filled 2014-08-15: qty 1

## 2014-08-15 MED ORDER — INSULIN GLARGINE 100 UNIT/ML ~~LOC~~ SOLN
12.0000 [IU] | Freq: Once | SUBCUTANEOUS | Status: AC
  Administered 2014-08-15: 12 [IU] via SUBCUTANEOUS
  Filled 2014-08-15: qty 0.12

## 2014-08-15 MED ORDER — INSULIN ASPART 100 UNIT/ML ~~LOC~~ SOLN
0.0000 [IU] | Freq: Three times a day (TID) | SUBCUTANEOUS | Status: DC
Start: 2014-08-15 — End: 2014-08-16
  Administered 2014-08-15: 11 [IU] via SUBCUTANEOUS
  Administered 2014-08-15 – 2014-08-16 (×2): 3 [IU] via SUBCUTANEOUS
  Administered 2014-08-16: 8 [IU] via SUBCUTANEOUS

## 2014-08-15 MED ORDER — SODIUM CHLORIDE 0.45 % IV SOLN
INTRAVENOUS | Status: DC
  Administered 2014-08-15 – 2014-08-16 (×3): via INTRAVENOUS

## 2014-08-15 MED ORDER — POTASSIUM CHLORIDE 10 MEQ/100ML IV SOLN
10.0000 meq | INTRAVENOUS | Status: AC
  Administered 2014-08-15 (×2): 10 meq via INTRAVENOUS
  Filled 2014-08-15 (×2): qty 100

## 2014-08-15 MED ORDER — LEVOFLOXACIN IN D5W 750 MG/150ML IV SOLN
750.0000 mg | INTRAVENOUS | Status: DC
  Administered 2014-08-15: 750 mg via INTRAVENOUS
  Filled 2014-08-15 (×2): qty 150

## 2014-08-15 MED ORDER — OCUVITE PO TABS
1.0000 | ORAL_TABLET | Freq: Every day | ORAL | Status: DC
  Administered 2014-08-15 – 2014-08-16 (×2): 1 via ORAL
  Filled 2014-08-15 (×2): qty 1

## 2014-08-15 MED ORDER — INSULIN ASPART 100 UNIT/ML ~~LOC~~ SOLN
0.0000 [IU] | Freq: Every day | SUBCUTANEOUS | Status: DC
  Administered 2014-08-15: 3 [IU] via SUBCUTANEOUS

## 2014-08-15 MED ORDER — INSULIN GLARGINE 100 UNIT/ML ~~LOC~~ SOLN
10.0000 [IU] | Freq: Every day | SUBCUTANEOUS | Status: DC
  Filled 2014-08-15: qty 0.1

## 2014-08-15 MED ORDER — IPRATROPIUM-ALBUTEROL 0.5-2.5 (3) MG/3ML IN SOLN: 3.0000 mL | RESPIRATORY_TRACT | Status: DC | PRN

## 2014-08-15 MED ORDER — VANCOMYCIN HCL IN DEXTROSE 750-5 MG/150ML-% IV SOLN
750.0000 mg | Freq: Two times a day (BID) | INTRAVENOUS | Status: DC
  Administered 2014-08-15: 750 mg via INTRAVENOUS
  Filled 2014-08-15 (×3): qty 150

## 2014-08-15 MED ORDER — ENOXAPARIN SODIUM 40 MG/0.4ML ~~LOC~~ SOLN
40.0000 mg | SUBCUTANEOUS | Status: DC
  Administered 2014-08-15: 40 mg via SUBCUTANEOUS
  Filled 2014-08-15 (×2): qty 0.4

## 2014-08-15 NOTE — Progress Notes (Addendum)
[place] TEAM 1 - Stepdown/ICU TEAM Progress Note  LEON GOODNOW ZOX:[id number] DOB: 07-15-1946 DOA: 08/13/2014 PCP: No primary provider on file.  Admit HPI / Brief Narrative: 68 yo F Hx noncompliance, diabetes mellitus type II, hypertension, and hypothyroidism who had been feeling increasingly weak to the point she found it difficult to walk. Patient also had some nonproductive cough for 2 days. Patient's husband stated she also had some slurred speech for 2 days. Patient had been quite lethargic.   In the ER patient was sleepy but answered questions appropriately. Labs revealed elevated blood sugar with anion gap with chest x-ray showing possible bilateral pneumonia. On exam patient was nonfocal. Patient was mildly febrile. Labs also reveal increased creatinine and baseline was not known.   HPI/Subjective: Pt is much more alert and interactive.  She denies specific complaints, but state she feels tired in general. She is hungry and anxious to be able to eat.  Denies cp, n/v, abdom pain, or ha.    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  A1c7.5 - on oral meds only at home - no evidence of true DKA w/ anion gap explained by acute renal failure - transition off insulin gtt - begin SSI - hold oral meds until intake established - ?if metformin was contributing to acidosis (though lactate was not high)  Acute encephalopathy  Most likely multifactorial to include HONK, acute renal failure, hypernatremia, hepatic encephalopathy - CT/MRI head negative - mental status improving - follow for now   1/2 Positive blood cx - c/w staph  Cont Vanc - await speciation/sensitivies - MRSA screen +  Acute renal failure   crt 2.7 at presentation - baseline unknown - hold ACE inhibitors and  diuretics and gently hydrate - rapidly improving   Normocytic anemia  baseline Hgb unknown - Fe <10 - B12 and folate normal - at very least will need screening colo (could be done as outpt) - Hgb steadily dropping w/ hydration - may actually prove to need transfusion while here - consider IV Fe load once clear if pt truly bacteremic   UTI UA not convincing - should be covered regardless by abx for PNA - f/u culture   Hypernatremia  Most c/w hypovolemia - cont w/ free water resuscitation   Hypothyroidism  TSH at goal of 1.0 - continue Synthroid  Hypertension  we will hold off lisinopril and diuretics due to renal failure - BP currently reasonably controlled  Elevated LFTs - Elevated ammonia - Suspected Shock Liver  Viral hepatitis panel negative - tylenol level pending - trial of lactulose if ammonia remains elevated in AM - image liver architecture if LFTs do not normalize - hold Lipitor until LFTs normalized   Moderate Rhabdomyolysis  CK at presentation 4435 - cont to hydrate and follow   MRSA screen +  Code Status: FULL Family Communication: spoke w/ niece at bedside  Disposition Plan: med bed   Consultants: none  Procedure/Significant Events: 9/28 PCXR Bilateral mid and lower lung zone opacity. bilateral PNA vs CHF 9/28 CT head without contrast No evidence of acute intracranial abnormality. 9/29 MRI brain without contrast No acute intracranial abnormality.  9/29 echocardiogram Left ventricle: mild LVH. LVEF=  60% to 65%.- (grade 1 diastolic dysfunction).   Antibiotics: Zosyn 9/28 > Vancomycin 9/28 >  DVT prophylaxis:  Lovenox  Objective: Blood pressure 133/57, pulse 65, temperature 97.7 F (36.5 C), temperature source Oral, resp. rate 18, height 5\' 5"  (1.651 m), weight 74.9 kg (165 lb 2 oz), SpO2 94.00%.  Intake/Output Summary (Last 24 hours) at 08/15/14 0905 Last data filed at 08/15/14 0800  Gross per 24 hour  Intake 5598.1 ml  Output   3100 ml  Net 2498.1 ml     Exam: General: alert and conversant - exam nonfocal - no acute resp distress  Lungs: bibasilar crackles - no wheeze  Cardiovascular: Regular rate and rhythm without murmur gallop or rub normal S1 and S2 Abdomen: Nontender, nondistended, soft, bowel sounds positive, no rebound, no ascites, no appreciable mass Extremities: No significant cyanosis, clubbing, or edema bilateral lower extremities  Data Reviewed: Basic Metabolic Panel:  Recent Labs Lab 08/13/14 1802 08/13/14 2207  08/14/14 0220 08/14/14 1630 08/14/14 2230 08/15/14 0137 08/15/14 0516  NA 152* 151*  < > 150* 152* 151* 150*  151* 150*  K 4.5 3.7  < > 3.7 3.5* 3.2* 3.3*  3.3* 3.6*  CL 109 115*  < > 115* 115* 117* 117*  118* 117*  CO2 19 17*  < > 19 22 20 20  21 20   GLUCOSE 616* 514*  < > 271* 147* 231* 195*  198* 180*  BUN 87* 83*  < > 77* 55* 40* 36*  36* 32*  CREATININE 2.68* 2.30*  < > 2.12* 1.50* 1.22* 1.18*  1.19* 1.11*  CALCIUM 9.6 8.6  < > 8.9 9.0 8.4 8.5  8.5 8.4  MG  --  2.6*  --   --   --   --  2.0  --   < > = values in this interval not displayed.  Liver Function Tests:  Recent Labs Lab 08/13/14 1802 08/14/14 1630 08/15/14 0137  AST 90* 95* 77*  ALT 83* 73* 63*  ALKPHOS 104 86 71  BILITOT 0.3 0.3 0.2*  PROT 8.3 7.2 6.2  ALBUMIN 2.7* 2.2* 1.9*    Recent Labs Lab 08/14/14 0056  AMMONIA 89*   CBC:  Recent Labs Lab 08/13/14 1802 08/13/14 2207 08/14/14 0220 08/15/14 0137  WBC 11.1* 10.5 11.7* 8.3  NEUTROABS 10.5*  --  10.9* 7.9*  HGB 10.5* 8.0* 9.1* 7.8*  HCT 31.4* 23.4* 27.1* 22.9*  MCV 88.5 85.1 87.1 86.4  PLT 310 281 283 256   Cardiac Enzymes:  Recent Labs Lab 08/13/14 2207 08/14/14 0220 08/14/14 0905  CKTOTAL 4435*  --   --   TROPONINI <0.30 <0.30 <0.30   CBG:  Recent Labs Lab 08/15/14 0305 08/15/14 0411 08/15/14 0503 08/15/14 0558 08/15/14 0754  GLUCAP 175* 180* 170* 187* 154*    Recent Results (from the past 240 hour(s))  CULTURE, BLOOD (ROUTINE X  2)     Status: None   Collection Time    08/13/14  6:15 PM      Result Value Ref Range Status   Specimen Description BLOOD ARM RIGHT   Final   Special Requests BOTTLES DRAWN AEROBIC AND ANAEROBIC 10CC   Final   Culture  Setup Time     Final   Value: 08/13/2014 22:09     Performed at [facility]   Culture     Final   Value: GRAM POSITIVE COCCI IN CLUSTERS     Note: Gram Stain Report Called to,Read Back By and Verified With: SHANNA STOWE ON 08/14/2014 AT 7:45P BY [employer]     Performed at [facility]  Report Status PENDING   Incomplete  MRSA PCR SCREENING     Status: Abnormal   Collection Time    08/13/14  8:52 PM      Result Value Ref Range Status   MRSA by PCR POSITIVE (*) NEGATIVE Final   Comment:            The GeneXpert MRSA Assay (FDA     approved for NASAL specimens     only), is one component of a     comprehensive MRSA colonization     surveillance program. It is not     intended to diagnose MRSA     infection nor to guide or     monitor treatment for     MRSA infections.     RESULT CALLED TO, READ BACK BY AND VERIFIED WITH:     Loleta Rose RN 2233 08/13/14 A BROWNING  CULTURE, BLOOD (ROUTINE X 2)     Status: None   Collection Time    08/13/14 10:07 PM      Result Value Ref Range Status   Specimen Description BLOOD RIGHT HAND   Final   Special Requests BOTTLES DRAWN AEROBIC ONLY 5CC   Final   Culture  Setup Time     Final   Value: 08/14/2014 03:54     Performed at [facility]   Culture     Final   Value:        BLOOD CULTURE RECEIVED NO GROWTH TO DATE CULTURE WILL BE HELD FOR 5 DAYS BEFORE ISSUING A FINAL NEGATIVE REPORT     Performed at [facility]   Report Status PENDING   Incomplete     Studies:  Recent x-ray studies have been reviewed in detail by the Attending Physician  Scheduled Meds:  Scheduled Meds: . aspirin  300 mg Rectal Daily   Or  . aspirin  325 mg Oral Daily  . atorvastatin  10 mg Oral QHS  . Chlorhexidine  Gluconate Cloth  6 each Topical Q0600  . dextromethorphan-guaiFENesin  1 tablet Oral BID  . enoxaparin (LOVENOX) injection  30 mg Subcutaneous Q24H  . Influenza vac split quadrivalent PF  0.5 mL Intramuscular Tomorrow-1000  . ipratropium-albuterol  3 mL Nebulization Q6H  . levothyroxine  50 mcg Oral QAC breakfast  . methylPREDNISolone (SOLU-MEDROL) injection  60 mg Intravenous Q24H  . mupirocin ointment  1 application Nasal BID  . piperacillin-tazobactam (ZOSYN)  IV  3.375 g Intravenous 3 times per day  . vancomycin  750 mg Intravenous Q12H    Time spent on care of this patient: 35 mins  Lonia Blood, MD [facility] For Consults/Admissions - Flow Manager - [phone] Office  [phone] Pager [phone]  On-Call/Text Page:      [email or web address]      password [facility]  08/15/2014, 9:05 AM   LOS: 2 days

## 2014-08-15 NOTE — Progress Notes (Signed)
ANTIBIOTIC CONSULT NOTE - FOLLOW UP  Pharmacy Consult for zosyn, vancomycin Indication: PNA  No Known Allergies  Patient Measurements: Height: 5\' 5"  (165.1 cm) Weight: 165 lb 2 oz (74.9 kg) IBW/kg (Calculated) : 57   Vital Signs: Temp: 98.3 F (36.8 C) (09/30 0342) Temp src: Oral (09/30 0342) BP: 120/48 mmHg (09/30 0342) Pulse Rate: 70 (09/30 0342) Intake/Output from previous day: 09/29 0701 - 09/30 0700 In: 5369.9 [P.O.:60; I.V.:4509.9; IV Piggyback:800] Out: 2300 [Urine:2300] Intake/Output from this shift:    Labs:  Recent Labs  08/13/14 2207  08/14/14 0220 08/14/14 0632  08/14/14 2230 08/15/14 0137 08/15/14 0516  WBC 10.5  --  11.7*  --   --   --  8.3  --   HGB 8.0*  --  9.1*  --   --   --  7.8*  --   PLT 281  --  283  --   --   --  256  --   LABCREA  --   --   --  74.62  --   --   --   --   CREATININE 2.30*  < > 2.12*  --   < > 1.22* 1.18*  1.19* 1.11*  < > = values in this interval not displayed. Estimated Creatinine Clearance: 49.8 ml/min (by C-G formula based on Cr of 1.11). No results found for this basename: VANCOTROUGH, Leodis BinetVANCOPEAK, VANCORANDOM, GENTTROUGH, GENTPEAK, GENTRANDOM, TOBRATROUGH, TOBRAPEAK, TOBRARND, AMIKACINPEAK, AMIKACINTROU, AMIKACIN,  in the last 72 hours   Microbiology: Recent Results (from the past 720 hour(s))  CULTURE, BLOOD (ROUTINE X 2)     Status: None   Collection Time    08/13/14  6:15 PM      Result Value Ref Range Status   Specimen Description BLOOD ARM RIGHT   Final   Special Requests BOTTLES DRAWN AEROBIC AND ANAEROBIC 10CC   Final   Culture  Setup Time     Final   Value: 08/13/2014 22:09     Performed at [facility]   Culture     Final   Value: GRAM POSITIVE COCCI IN CLUSTERS     Note: Gram Stain Report Called to,Read Back By and Verified With: SHANNA STOWE ON 08/14/2014 AT 7:45P BY WILEJ     Performed at [facility]   Report Status PENDING   Incomplete  MRSA PCR SCREENING     Status: Abnormal   Collection Time    08/13/14  8:52 PM      Result Value Ref Range Status   MRSA by PCR POSITIVE (*) NEGATIVE Final   Comment:            The GeneXpert MRSA Assay (FDA     approved for NASAL specimens     only), is one component of a     comprehensive MRSA colonization     surveillance program. It is not     intended to diagnose MRSA     infection nor to guide or     monitor treatment for     MRSA infections.     RESULT CALLED TO, READ BACK BY AND VERIFIED WITHLoleta Rose:     R ZARSONA RN 2233 08/13/14 A BROWNING    Anti-infectives   Start     Dose/Rate Route Frequency Ordered Stop   08/15/14 1800  vancomycin (VANCOCIN) IVPB 1000 mg/200 mL premix  Status:  Discontinued     1,000 mg 200 mL/hr over 60 Minutes Intravenous Every 48 hours 08/13/14 1914 08/14/14  1451   08/15/14 0810  vancomycin (VANCOCIN) IVPB 750 mg/150 ml premix     750 mg 150 mL/hr over 60 Minutes Intravenous Every 12 hours 08/15/14 0719     08/14/14 2200  piperacillin-tazobactam (ZOSYN) IVPB 3.375 g     3.375 g 12.5 mL/hr over 240 Minutes Intravenous 3 times per day 08/14/14 1451     08/14/14 1900  vancomycin (VANCOCIN) IVPB 750 mg/150 ml premix  Status:  Discontinued     750 mg 150 mL/hr over 60 Minutes Intravenous Every 24 hours 08/14/14 1451 08/15/14 0719   08/14/14 0200  piperacillin-tazobactam (ZOSYN) IVPB 2.25 g  Status:  Discontinued     2.25 g 100 mL/hr over 30 Minutes Intravenous Every 6 hours 08/13/14 1914 08/14/14 1451   08/13/14 2115  cefTRIAXone (ROCEPHIN) 1 g in dextrose 5 % 50 mL IVPB  Status:  Discontinued     1 g 100 mL/hr over 30 Minutes Intravenous Every 24 hours 08/13/14 2100 08/13/14 2103   08/13/14 2115  azithromycin (ZITHROMAX) 500 mg in dextrose 5 % 250 mL IVPB  Status:  Discontinued     500 mg 250 mL/hr over 60 Minutes Intravenous Every 24 hours 08/13/14 2100 08/13/14 2103   08/13/14 1815  piperacillin-tazobactam (ZOSYN) IVPB 3.375 g     3.375 g 100 mL/hr over 30 Minutes Intravenous  Once 08/13/14  1803 08/13/14 1908   08/13/14 1815  vancomycin (VANCOCIN) IVPB 1000 mg/200 mL premix     1,000 mg 200 mL/hr over 60 Minutes Intravenous  Once 08/13/14 1803 08/13/14 1925      Assessment: 68 yo female with acute encephalopathy with possible PNA/sepsis on vancomycin and zosyn. WBC= 8.3, tmax= 100.8, SCr= 1.11 (trend down) and CrCl ~ 50 (baseline renal function is not clear).  9/28 vanc>> 9/28 zosyn>>  9/28 blood x2: GPC clusters in 1/2 9/29 urine 9/29 blood x2  Goal of Therapy:  Vancomycin trough level 15-20 mcg/ml  Plan:  -Change zosyn to 3.375gm IV q8h -Change vancomycin to IV q12hh -Will follow renal function, cultures and clinical progress  Harland German, Pharm D 08/15/2014 7:19 AM

## 2014-08-15 NOTE — Progress Notes (Signed)
[facility] 5w21

## 2014-08-15 NOTE — Progress Notes (Signed)
Speech Language Pathology Treatment: Dysphagia;Cognitive-Linquistic  Patient Details Name: Catherine [name] MRN: [id number] DOB: 10-07-1946 Today's Date: 08/15/2014 Time: [id number] SLP Time Calculation (min): 14 min  Assessment / Plan / Recommendation Clinical Impression  Skilled observation with regular texture (possible upgrade) and thin liquid yielded functional and safe consumption without evidence of aspiration.  Minimal verbal cues for small sips provided.  SLP recommends diet texture upgrade to regular and continue thin liquids; no further intervention needed for swallowing.  Interventions for cognition included min-mod prompts for working and anticipatory memory; pt. denies memory difficulty but unable to recall relevant specifics from rehab PA-C visit 5 minutes earlier or what she ordered for lunch; did accurately state she did not receive breakfast (information may not have been important to pt., however suspected she would have recalled more than demonstrated).  Educated and discussed memory strategies.  She followed 2 step commands with accuracy, no cues needed.  Sustained attention to SLP without difficulty; keeps eyes closed at times while she speaks/listens.  Will briefly follow for cognitive abilities.   HPI HPI: Pt is a [age] y.o. female with history of DM, HTN, hypothyroidism has been feeling increasingly weak to the point patient has been feeling difficult to walk over the last one week PTA. Patient also has been having some nonproductive cough over the last 2 days PTA. Patient's husband who provided most of the history in ED states that patient also was having some slurred speech last 2 days PTA. In the ER labs reveal elevated blood sugar with anion gap with chest x-ray showing possible bilateral pneumonia. Pt admitted with DKA and pneumonia. CT/MRI negative for acute infarct.   Pertinent Vitals Pain Assessment: No/denies pain  SLP Plan  Continue with current plan of care     Recommendations Diet recommendations: Regular;Thin liquid Liquids provided via: Cup;Straw Medication Administration: Whole meds with puree Supervision: Patient able to self feed;Intermittent supervision to cue for compensatory strategies Compensations: Slow rate;Small sips/bites Postural Changes and/or Swallow Maneuvers: Seated upright 90 degrees              Oral Care Recommendations: Oral care BID Follow up Recommendations: None Plan: Continue with current plan of care    GO     Catherine MacadamiaLitaker, Catherine [name] 08/15/2014, 11:53 AM  Catherine CoonsLisa [name] Catherine FaceLitaker M.Ed [employer]SLP Pager [phone]

## 2014-08-15 NOTE — Consult Note (Signed)
Physical Medicine and Rehabilitation Consult Reason for Consult: Debilitation/CAP/DKA Referring Physician: [facility]   HPI: Catherine [name] is a 68 y.o. right-handed female with history of diabetes mellitus as well as hypertension. Admitted 08/13/2014 with increasing weakness over the past week as well as nonproductive cough. Denied any chills or fever. Husband had noted some slurred speech over 2 days as well as increasing lethargy. Chest x-ray showed bilateral pneumonia. Elevated blood sugars in the ER 568. Placed on broad-spectrum antibiotics. Strep pneumo urine screen negative. MRI of the brain completed due to confusion showing  no acute abnormalities. Subcutaneous Lovenox for DVT prophylaxis. Hemoglobin A1c 7.5 with insulin therapy as directed. Occupational therapy evaluation completed noting confusion/ debilitation with request for physical medicine rehabilitation consult   Review of Systems  Respiratory: Positive for cough.   Neurological: Positive for weakness.  Psychiatric/Behavioral:       Bouts of confusion  All other systems reviewed and are negative.  Past Medical History  Diagnosis Date  . Diabetes mellitus without complication   . Hypertension   . Thyroid disease    Past Surgical History  Procedure Laterality Date  . Cholecystectomy     Family History  Problem Relation Age of Onset  . Diabetes Mellitus II Mother   . Stroke Mother   . Diabetes Mellitus II Sister   . Hypertension Sister    Social History:  reports that she has never smoked. She does not have any smokeless tobacco history on file. She reports that she does not drink alcohol or use illicit drugs. Allergies: No Known Allergies Medications Prior to Admission  Medication Sig Dispense Refill  . aspirin 81 MG tablet Take 81 mg by mouth daily.      [name] Kitchen atorvastatin (LIPITOR) 10 MG tablet Take 10 mg by mouth daily.      . beta carotene w/minerals (OCUVITE) tablet Take 1 tablet by mouth daily.      .  Cyanocobalamin (VITAMIN B 12 PO) Take 1,000 mg by mouth daily.      [name] Kitchen levothyroxine (SYNTHROID, LEVOTHROID) 50 MCG tablet Take 50 mcg by mouth daily before breakfast.      . lisinopril-hydrochlorothiazide (PRINZIDE,ZESTORETIC) 20-12.5 MG per tablet Take 1 tablet by mouth daily.      . Saxagliptin-Metformin (KOMBIGLYZE XR) 2.03-999 MG TB24 Take 1 tablet by mouth daily.        Home: Home Living Family/patient expects to be discharged to:: Private residence Living Arrangements: Spouse/significant other Available Help at Discharge: Family;Available 24 hours/day Type of Home: House Home Access: Stairs to enter [employer] of Steps: 1 Home Layout: One level Home Equipment: None Additional Comments: husband answering questions for pt, although pt capable  Functional History: Prior Function Level of Independence: Independent Comments: pt reports she drives and does IADL Functional Status:  Mobility: Bed Mobility Overal bed mobility: Needs Assistance Bed Mobility: Supine to Sit;Sit to Supine Supine to sit: Mod assist Sit to supine: Mod assist General bed mobility comments: moves slowly due to lethargy, assist for LEs and to guide trunk Transfers Overall transfer level: Needs assistance Equipment used: 1 person hand held assist Transfers: Sit to/from Stand Sit to Stand: Min assist General transfer comment: assist to steady, pt able to power up, extra time Ambulation/Gait General Gait Details: Deferred due to pt fatigue and unsteadiness.    ADL: ADL Overall ADL's : Needs assistance/impaired Eating/Feeding: NPO Grooming: Wash/dry face;Set up;Sitting Upper Body Dressing : Minimal assistance;Sitting (supported sitting) Lower Body Dressing: Sit to/from stand;Moderate  assistance Lower Body Dressing Details (indicate cue type and reason): pt with minimal attempt to donn socks, appearing fatigued Toilet Transfer: Minimal assistance;Stand-pivot;BSC Toileting- Clothing  Manipulation and Hygiene: Sitting/lateral lean;Minimal assistance General ADL Comments: Pt with generalized weakness and lethargy.  Cognition: Cognition Overall Cognitive Status: Difficult to assess Arousal/Alertness: Awake/alert Orientation Level: Oriented X4 Attention: Sustained Sustained Attention: Impaired Sustained Attention Impairment: Verbal basic;Functional basic Memory: Impaired Memory Impairment: Decreased recall of new information Awareness: Impaired Awareness Impairment: Intellectual impairment Problem Solving: Impaired Problem Solving Impairment: Functional basic Safety/Judgment: Impaired Cognition Arousal/Alertness: Lethargic Behavior During Therapy: Flat affect Overall Cognitive Status: Difficult to assess Difficult to assess due to: Level of arousal  Blood pressure 133/57, pulse 65, temperature 97.7 F (36.5 C), temperature source Oral, resp. rate 18, height 5\' 5"  (1.651 m), weight 74.9 kg (165 lb 2 oz), SpO2 96.00%. Physical Exam  Constitutional: She appears well-developed.  HENT:  Head: Normocephalic.  Eyes: EOM are normal.  Neck: Normal range of motion. Neck supple. No thyromegaly present.  Cardiovascular: Normal rate and regular rhythm.   Respiratory:  Decreased breath sounds at the bases but clear to auscultation  GI: Soft. Bowel sounds are normal. She exhibits no distension.  Neurological:  Mood is flat but appropriate. She mostly Her eyes closed during exam. She was able to provide her name, age and date of birth. Follows simple commands.  Skin: Skin is warm and dry.    Results for orders placed during the hospital encounter of 08/13/14 (from the past 24 hour(s))  GLUCOSE, CAPILLARY     Status: Abnormal   Collection Time    08/14/14 11:26 AM      Result Value Ref Range   Glucose-Capillary 213 (*) 70 - 99 mg/dL   Comment 1 Capillary Sample    GLUCOSE, CAPILLARY     Status: Abnormal   Collection Time    08/14/14 12:10 PM      Result Value Ref  Range   Glucose-Capillary 211 (*) 70 - 99 mg/dL  GLUCOSE, CAPILLARY     Status: Abnormal   Collection Time    08/14/14  1:13 PM      Result Value Ref Range   Glucose-Capillary 217 (*) 70 - 99 mg/dL   Comment 1 Capillary Sample    GLUCOSE, CAPILLARY     Status: Abnormal   Collection Time    08/14/14  2:03 PM      Result Value Ref Range   Glucose-Capillary 214 (*) 70 - 99 mg/dL   Comment 1 Capillary Sample    GLUCOSE, CAPILLARY     Status: Abnormal   Collection Time    08/14/14  2:59 PM      Result Value Ref Range   Glucose-Capillary 184 (*) 70 - 99 mg/dL   Comment 1 Capillary Sample    GLUCOSE, CAPILLARY     Status: Abnormal   Collection Time    08/14/14  3:49 PM      Result Value Ref Range   Glucose-Capillary 156 (*) 70 - 99 mg/dL   Comment 1 Capillary Sample    COMPREHENSIVE METABOLIC PANEL     Status: Abnormal   Collection Time    08/14/14  4:30 PM      Result Value Ref Range   Sodium 152 (*) 137 - 147 mEq/L   Potassium 3.5 (*) 3.7 - 5.3 mEq/L   Chloride 115 (*) 96 - 112 mEq/L   CO2 22  19 - 32 mEq/L   Glucose, Bld 147 (*) 70 -  99 mg/dL   BUN 55 (*) 6 - 23 mg/dL   Creatinine, Ser 4.09 (*) 0.50 - 1.10 mg/dL   Calcium 9.0  8.4 - 81.1 mg/dL   Total Protein 7.2  6.0 - 8.3 g/dL   Albumin 2.2 (*) 3.5 - 5.2 g/dL   AST 95 (*) 0 - 37 U/L   ALT 73 (*) 0 - 35 U/L   Alkaline Phosphatase 86  39 - 117 U/L   Total Bilirubin 0.3  0.3 - 1.2 mg/dL   GFR calc non Af Amer 35 (*) >90 mL/min   GFR calc Af Amer 40 (*) >90 mL/min   Anion gap 15  5 - 15  GLUCOSE, CAPILLARY     Status: Abnormal   Collection Time    08/14/14  4:46 PM      Result Value Ref Range   Glucose-Capillary 143 (*) 70 - 99 mg/dL  GLUCOSE, CAPILLARY     Status: Abnormal   Collection Time    08/14/14  5:42 PM      Result Value Ref Range   Glucose-Capillary 116 (*) 70 - 99 mg/dL  URINE RAPID DRUG SCREEN (HOSP PERFORMED)     Status: None   Collection Time    08/14/14  5:44 PM      Result Value Ref Range    Opiates NONE DETECTED  NONE DETECTED   Cocaine NONE DETECTED  NONE DETECTED   Benzodiazepines NONE DETECTED  NONE DETECTED   Amphetamines NONE DETECTED  NONE DETECTED   Tetrahydrocannabinol NONE DETECTED  NONE DETECTED   Barbiturates NONE DETECTED  NONE DETECTED  GLUCOSE, CAPILLARY     Status: Abnormal   Collection Time    08/14/14  6:39 PM      Result Value Ref Range   Glucose-Capillary 117 (*) 70 - 99 mg/dL  GLUCOSE, CAPILLARY     Status: Abnormal   Collection Time    08/14/14  8:04 PM      Result Value Ref Range   Glucose-Capillary 160 (*) 70 - 99 mg/dL  GLUCOSE, CAPILLARY     Status: Abnormal   Collection Time    08/14/14  9:02 PM      Result Value Ref Range   Glucose-Capillary 221 (*) 70 - 99 mg/dL  GLUCOSE, CAPILLARY     Status: Abnormal   Collection Time    08/14/14 10:03 PM      Result Value Ref Range   Glucose-Capillary 231 (*) 70 - 99 mg/dL  BASIC METABOLIC PANEL     Status: Abnormal   Collection Time    08/14/14 10:30 PM      Result Value Ref Range   Sodium 151 (*) 137 - 147 mEq/L   Potassium 3.2 (*) 3.7 - 5.3 mEq/L   Chloride 117 (*) 96 - 112 mEq/L   CO2 20  19 - 32 mEq/L   Glucose, Bld 231 (*) 70 - 99 mg/dL   BUN 40 (*) 6 - 23 mg/dL   Creatinine, Ser 9.14 (*) 0.50 - 1.10 mg/dL   Calcium 8.4  8.4 - 78.2 mg/dL   GFR calc non Af Amer 45 (*) >90 mL/min   GFR calc Af Amer 52 (*) >90 mL/min   Anion gap 14  5 - 15  GLUCOSE, CAPILLARY     Status: Abnormal   Collection Time    08/14/14 11:07 PM      Result Value Ref Range   Glucose-Capillary 222 (*) 70 - 99 mg/dL  GLUCOSE, CAPILLARY  Status: Abnormal   Collection Time    08/15/14 12:10 AM      Result Value Ref Range   Glucose-Capillary 202 (*) 70 - 99 mg/dL  GLUCOSE, CAPILLARY     Status: Abnormal   Collection Time    08/15/14  1:01 AM      Result Value Ref Range   Glucose-Capillary 184 (*) 70 - 99 mg/dL  CBC WITH DIFFERENTIAL     Status: Abnormal   Collection Time    08/15/14  1:37 AM      Result  Value Ref Range   WBC 8.3  4.0 - 10.5 K/uL   RBC 2.65 (*) 3.87 - 5.11 MIL/uL   Hemoglobin 7.8 (*) 12.0 - 15.0 g/dL   HCT 16.1 (*) 09.6 - 04.5 %   MCV 86.4  78.0 - 100.0 fL   MCH 29.4  26.0 - 34.0 pg   MCHC 34.1  30.0 - 36.0 g/dL   RDW 40.9  81.1 - 91.4 %   Platelets 256  150 - 400 K/uL   Neutrophils Relative % 95 (*) 43 - 77 %   Lymphocytes Relative 4 (*) 12 - 46 %   Monocytes Relative 1 (*) 3 - 12 %   Eosinophils Relative 0  0 - 5 %   Basophils Relative 0  0 - 1 %   Neutro Abs 7.9 (*) 1.7 - 7.7 K/uL   Lymphs Abs 0.3 (*) 0.7 - 4.0 K/uL   Monocytes Absolute 0.1  0.1 - 1.0 K/uL   Eosinophils Absolute 0.0  0.0 - 0.7 K/uL   Basophils Absolute 0.0  0.0 - 0.1 K/uL   WBC Morphology TOXIC GRANULATION    COMPREHENSIVE METABOLIC PANEL     Status: Abnormal   Collection Time    08/15/14  1:37 AM      Result Value Ref Range   Sodium 150 (*) 137 - 147 mEq/L   Potassium 3.3 (*) 3.7 - 5.3 mEq/L   Chloride 117 (*) 96 - 112 mEq/L   CO2 20  19 - 32 mEq/L   Glucose, Bld 195 (*) 70 - 99 mg/dL   BUN 36 (*) 6 - 23 mg/dL   Creatinine, Ser 7.82 (*) 0.50 - 1.10 mg/dL   Calcium 8.5  8.4 - 95.6 mg/dL   Total Protein 6.2  6.0 - 8.3 g/dL   Albumin 1.9 (*) 3.5 - 5.2 g/dL   AST 77 (*) 0 - 37 U/L   ALT 63 (*) 0 - 35 U/L   Alkaline Phosphatase 71  39 - 117 U/L   Total Bilirubin 0.2 (*) 0.3 - 1.2 mg/dL   GFR calc non Af Amer 47 (*) >90 mL/min   GFR calc Af Amer 54 (*) >90 mL/min   Anion gap 13  5 - 15  LACTIC ACID, PLASMA     Status: None   Collection Time    08/15/14  1:37 AM      Result Value Ref Range   Lactic Acid, Venous 1.8  0.5 - 2.2 mmol/L  PROCALCITONIN     Status: None   Collection Time    08/15/14  1:37 AM      Result Value Ref Range   Procalcitonin 0.19    MAGNESIUM     Status: None   Collection Time    08/15/14  1:37 AM      Result Value Ref Range   Magnesium 2.0  1.5 - 2.5 mg/dL  BASIC METABOLIC PANEL  Status: Abnormal   Collection Time    08/15/14  1:37 AM      Result Value  Ref Range   Sodium 151 (*) 137 - 147 mEq/L   Potassium 3.3 (*) 3.7 - 5.3 mEq/L   Chloride 118 (*) 96 - 112 mEq/L   CO2 21  19 - 32 mEq/L   Glucose, Bld 198 (*) 70 - 99 mg/dL   BUN 36 (*) 6 - 23 mg/dL   Creatinine, Ser 9.56 (*) 0.50 - 1.10 mg/dL   Calcium 8.5  8.4 - 21.3 mg/dL   GFR calc non Af Amer 46 (*) >90 mL/min   GFR calc Af Amer 54 (*) >90 mL/min   Anion gap 12  5 - 15  GLUCOSE, CAPILLARY     Status: Abnormal   Collection Time    08/15/14  2:00 AM      Result Value Ref Range   Glucose-Capillary 194 (*) 70 - 99 mg/dL   Comment 1 Capillary Sample    GLUCOSE, CAPILLARY     Status: Abnormal   Collection Time    08/15/14  3:05 AM      Result Value Ref Range   Glucose-Capillary 175 (*) 70 - 99 mg/dL  GLUCOSE, CAPILLARY     Status: Abnormal   Collection Time    08/15/14  4:11 AM      Result Value Ref Range   Glucose-Capillary 180 (*) 70 - 99 mg/dL  GLUCOSE, CAPILLARY     Status: Abnormal   Collection Time    08/15/14  5:03 AM      Result Value Ref Range   Glucose-Capillary 170 (*) 70 - 99 mg/dL   Comment 1 Capillary Sample    BASIC METABOLIC PANEL     Status: Abnormal   Collection Time    08/15/14  5:16 AM      Result Value Ref Range   Sodium 150 (*) 137 - 147 mEq/L   Potassium 3.6 (*) 3.7 - 5.3 mEq/L   Chloride 117 (*) 96 - 112 mEq/L   CO2 20  19 - 32 mEq/L   Glucose, Bld 180 (*) 70 - 99 mg/dL   BUN 32 (*) 6 - 23 mg/dL   Creatinine, Ser 0.86 (*) 0.50 - 1.10 mg/dL   Calcium 8.4  8.4 - 57.8 mg/dL   GFR calc non Af Amer 50 (*) >90 mL/min   GFR calc Af Amer 58 (*) >90 mL/min   Anion gap 13  5 - 15  GLUCOSE, CAPILLARY     Status: Abnormal   Collection Time    08/15/14  5:58 AM      Result Value Ref Range   Glucose-Capillary 187 (*) 70 - 99 mg/dL   Comment 1 Capillary Sample    GLUCOSE, CAPILLARY     Status: Abnormal   Collection Time    08/15/14  7:54 AM      Result Value Ref Range   Glucose-Capillary 154 (*) 70 - 99 mg/dL   Comment 1 Capillary Sample     Ct  Head Wo Contrast  08/13/2014   CLINICAL DATA:  Hypotensive, general malaise, tachycardia, hyperglycemic  EXAM: CT HEAD WITHOUT CONTRAST  TECHNIQUE: Contiguous axial images were obtained from the base of the skull through the vertex without intravenous contrast.  COMPARISON:  None.  FINDINGS: No evidence of parenchymal hemorrhage or extra-axial fluid collection. No mass lesion, mass effect, or midline shift.  No CT evidence of acute infarction.  Cerebral volume is within  normal limits.  No ventriculomegaly.  Partial opacification of the right frontal and ethmoid sinuses. The mastoid air cells are unopacified.  No evidence of calvarial fracture.  IMPRESSION: No evidence of acute intracranial abnormality.   Electronically Signed   By: Charline Bills M.D.   On: 08/13/2014 20:29   Mr Brain Wo Contrast  08/14/2014   CLINICAL DATA:  Slurred speech and weakness.  EXAM: MRI HEAD WITHOUT CONTRAST  TECHNIQUE: Multiplanar, multiecho pulse sequences of the brain and surrounding structures were obtained without intravenous contrast.  COMPARISON:  Head CT 08/13/2014  FINDINGS: There is no evidence of acute infarct, intracranial hemorrhage, mass, midline shift, or extra-axial fluid collection. Scattered, small foci of T2 hyperintensity in the subcortical and deep cerebral white matter and brainstem are nonspecific but compatible with mild chronic small vessel ischemic disease. There is mild generalized cerebral atrophy, within normal limits for age.  Orbits are unremarkable. Partial right sphenoid sinus opacification is noted as well as mild right inferior frontal sinus and right anterior ethmoid air cell mucosal thickening. Trace right mastoid fluid. Major intracranial vascular flow voids are preserved.  IMPRESSION: 1. No acute intracranial abnormality. 2. Mild chronic small vessel ischemic disease.   Electronically Signed   By: Sebastian Ache   On: 08/14/2014 12:12   Dg Chest Port 1 View  08/13/2014   CLINICAL DATA:   Fever and cough ; hypoxia  EXAM: PORTABLE CHEST - 1 VIEW  COMPARISON:  None.  FINDINGS: There is patchy alveolar opacity in both mid and lower lung zones. Heart is upper normal in size with pulmonary vascularity within normal limits. No effusions. No adenopathy. No bone lesions.  IMPRESSION: Bilateral mid and lower lung zone opacity. Suspect bilateral pneumonia given the appearance, although a slightly atypical appearance of congestive heart failure could present in this manner. Both entities may exist concurrently. No adenopathy apparent.   Electronically Signed   By: Bretta Bang M.D.   On: 08/13/2014 18:30    Assessment/Plan: Diagnosis: deconditioning related to pneumonia/dka 1. Does the need for close, 24 hr/day medical supervision in concert with the patient's rehab needs make it unreasonable for this patient to be served in a less intensive setting? Yes 2. Co-Morbidities requiring supervision/potential complications: acute renal failure 3. Due to bladder management, bowel management, safety, skin/wound care, disease management, medication administration, pain management and patient education, does the patient require 24 hr/day rehab nursing? Yes 4. Does the patient require coordinated care of a physician, rehab nurse, PT (1-2 hrs/day, 5 days/week), OT (1-2 hrs/day, 5 days/week) and SLP (1-2 hrs/day, 5 days/week) to address physical and functional deficits in the context of the above medical diagnosis(es)? Yes Addressing deficits in the following areas: balance, endurance, locomotion, strength, transferring, bowel/bladder control, bathing, dressing, feeding, grooming and toileting 5. Can the patient actively participate in an intensive therapy program of at least 3 hrs of therapy per day at least 5 days per week? Yes 6. The potential for patient to make measurable gains while on inpatient rehab is excellent 7. Anticipated functional outcomes upon discharge from inpatient rehab are modified  independent  with PT, modified independent and supervision with OT, n/a with SLP. 8. Estimated rehab length of stay to reach the above functional goals is: 10-14 days 9. Does the patient have adequate social supports to accommodate these discharge functional goals? Yes 10. Anticipated D/C setting: Home 11. Anticipated post D/C treatments: HH therapy 12. Overall Rehab/Functional Prognosis: excellent  RECOMMENDATIONS: This patient's condition is appropriate for continued rehabilitative  care in the following setting: CIR Patient has agreed to participate in recommended program. Yes Note that insurance prior authorization may be required for reimbursement for recommended care.  Comment: Rehab Admissions Coordinator to follow up.  Thanks,  Ranelle OysterZachary T. Herby Amick, MD, [place] DomFAAPMR     08/15/2014

## 2014-08-15 NOTE — Progress Notes (Signed)
NURSING PROGRESS NOTE  Catherine SpeckingBarbara P [name] [id number] Transfer Data: 08/15/2014 5:37 PM Attending Provider: Lonia BloodJeffrey T McClung, MD PCP:No primary provider on file. Code Status: FULL  Catherine SpeckingBarbara P [name] is a 68 y.o. female patient transferred from 2H -No acute distress noted.  -No complaints of shortness of breath.  -No complaints of chest pain.   Blood pressure 139/54, pulse 66, temperature 97.9 F (36.6 C), temperature source Oral, resp. rate 16, height 5\' 5"  (1.651 m), weight 74.9 kg (165 lb 2 oz), SpO2 97.00%.   IV Fluids:  Right Antecubital and left wrist peripheral IV intact with no phlebitis or infiltration noted.   Allergies:  Review of patient's allergies indicates no known allergies.  Past Medical History:   has a past medical history of Diabetes mellitus without complication; Hypertension; and Thyroid disease.  Past Surgical History:   has past surgical history that includes Cholecystectomy.  Skin: intact  Patient/Family orientated to room. Information packet given to patient/family. Admission inpatient armband information verified with patient/family to include name and date of birth and placed on patient arm. Side rails up x 2, fall assessment and education completed with patient/family. Patient/family able to verbalize understanding of risk associated with falls and verbalized understanding to call for assistance before getting out of bed. Call light within reach. Patient/family able to voice and demonstrate understanding of unit orientation instructions.    Will continue to evaluate and treat per MD orders.  Catherine Parsonsattha Jacquan Savas, RN

## 2014-08-16 ENCOUNTER — Inpatient Hospital Stay (HOSPITAL_COMMUNITY)
Admission: RE | Admit: 2014-08-16 | Discharge: 2014-08-23 | DRG: 947 | Disposition: A | Discharge status: Home / Self Care | Payer: Medicare Other | Source: Intra-hospital | Attending: Physical Medicine & Rehabilitation | Admitting: Physical Medicine & Rehabilitation

## 2014-08-16 ENCOUNTER — Inpatient Hospital Stay (HOSPITAL_COMMUNITY): Payer: Medicare Other

## 2014-08-16 DIAGNOSIS — R454 Irritability and anger: Secondary | ICD-10-CM | POA: Diagnosis present

## 2014-08-16 DIAGNOSIS — R5381 Other malaise: Secondary | ICD-10-CM | POA: Diagnosis not present

## 2014-08-16 DIAGNOSIS — J189 Pneumonia, unspecified organism: Secondary | ICD-10-CM | POA: Diagnosis not present

## 2014-08-16 DIAGNOSIS — E039 Hypothyroidism, unspecified: Secondary | ICD-10-CM | POA: Diagnosis present

## 2014-08-16 DIAGNOSIS — D62 Acute posthemorrhagic anemia: Secondary | ICD-10-CM

## 2014-08-16 DIAGNOSIS — K5900 Constipation, unspecified: Secondary | ICD-10-CM | POA: Diagnosis present

## 2014-08-16 DIAGNOSIS — E1101 Type 2 diabetes mellitus with hyperosmolarity with coma: Secondary | ICD-10-CM

## 2014-08-16 DIAGNOSIS — E870 Hyperosmolality and hypernatremia: Secondary | ICD-10-CM

## 2014-08-16 DIAGNOSIS — D649 Anemia, unspecified: Secondary | ICD-10-CM | POA: Diagnosis present

## 2014-08-16 DIAGNOSIS — K921 Melena: Secondary | ICD-10-CM | POA: Diagnosis present

## 2014-08-16 DIAGNOSIS — G9340 Encephalopathy, unspecified: Secondary | ICD-10-CM

## 2014-08-16 DIAGNOSIS — E1310 Other specified diabetes mellitus with ketoacidosis without coma: Secondary | ICD-10-CM

## 2014-08-16 DIAGNOSIS — Z7982 Long term (current) use of aspirin: Secondary | ICD-10-CM

## 2014-08-16 DIAGNOSIS — E876 Hypokalemia: Secondary | ICD-10-CM | POA: Diagnosis present

## 2014-08-16 DIAGNOSIS — R1310 Dysphagia, unspecified: Secondary | ICD-10-CM | POA: Diagnosis present

## 2014-08-16 DIAGNOSIS — N179 Acute kidney failure, unspecified: Secondary | ICD-10-CM | POA: Diagnosis present

## 2014-08-16 DIAGNOSIS — I10 Essential (primary) hypertension: Secondary | ICD-10-CM | POA: Diagnosis present

## 2014-08-16 DIAGNOSIS — N1831 Chronic kidney disease, stage 3a: Secondary | ICD-10-CM

## 2014-08-16 DIAGNOSIS — Z79899 Other long term (current) drug therapy: Secondary | ICD-10-CM | POA: Diagnosis not present

## 2014-08-16 DIAGNOSIS — E1110 Type 2 diabetes mellitus with ketoacidosis without coma: Secondary | ICD-10-CM

## 2014-08-16 DIAGNOSIS — E119 Type 2 diabetes mellitus without complications: Secondary | ICD-10-CM | POA: Diagnosis not present

## 2014-08-16 DIAGNOSIS — R7989 Other specified abnormal findings of blood chemistry: Secondary | ICD-10-CM

## 2014-08-16 LAB — COMPREHENSIVE METABOLIC PANEL
ALT: 66 U/L — ABNORMAL HIGH (ref 0–35)
AST: 63 U/L — ABNORMAL HIGH (ref 0–37)
Albumin: 2 g/dL — ABNORMAL LOW (ref 3.5–5.2)
Alkaline Phosphatase: 73 U/L (ref 39–117)
Anion gap: 13 (ref 5–15)
BILIRUBIN TOTAL: 0.5 mg/dL (ref 0.3–1.2)
BUN: 19 mg/dL (ref 6–23)
CALCIUM: 8.2 mg/dL — AB (ref 8.4–10.5)
CHLORIDE: 108 meq/L (ref 96–112)
CO2: 22 meq/L (ref 19–32)
Creatinine, Ser: 0.96 mg/dL (ref 0.50–1.10)
GFR calc Af Amer: 69 mL/min — ABNORMAL LOW (ref >90)
GFR, EST NON AFRICAN AMERICAN: 60 mL/min — AB (ref >90)
GLUCOSE: 261 mg/dL — AB (ref 70–99)
Potassium: 3.5 mEq/L — ABNORMAL LOW (ref 3.7–5.3)
Sodium: 143 mEq/L (ref 137–147)
Total Protein: 6.4 g/dL (ref 6.0–8.3)

## 2014-08-16 LAB — CBC
HCT: 26.1 % — ABNORMAL LOW (ref 36.0–46.0)
Hemoglobin: 9 g/dL — ABNORMAL LOW (ref 12.0–15.0)
MCH: 29.2 pg (ref 26.0–34.0)
MCHC: 34.5 g/dL (ref 30.0–36.0)
MCV: 84.7 fL (ref 78.0–100.0)
PLATELETS: 257 10*3/uL (ref 150–400)
RBC: 3.08 MIL/uL — AB (ref 3.87–5.11)
RDW: 14.1 % (ref 11.5–15.5)
WBC: 6.2 10*3/uL (ref 4.0–10.5)

## 2014-08-16 LAB — GLUCOSE, CAPILLARY
Glucose-Capillary: 255 mg/dL — ABNORMAL HIGH (ref 70–99)
Glucose-Capillary: 199 mg/dL — ABNORMAL HIGH (ref 70–99)
Glucose-Capillary: 244 mg/dL — ABNORMAL HIGH (ref 70–99)
Glucose-Capillary: 271 mg/dL — ABNORMAL HIGH (ref 70–99)

## 2014-08-16 LAB — AMMONIA: Ammonia: 19 umol/L (ref 11–60)

## 2014-08-16 LAB — CK: Total CK: 1245 U/L — ABNORMAL HIGH (ref 7–177)

## 2014-08-16 MED ORDER — LINAGLIPTIN 5 MG PO TABS
5.0000 mg | ORAL_TABLET | Freq: Every day | ORAL | Status: DC
  Administered 2014-08-16 – 2014-08-23 (×8): 5 mg via ORAL
  Filled 2014-08-16 (×9): qty 1

## 2014-08-16 MED ORDER — INSULIN GLARGINE 100 UNIT/ML ~~LOC~~ SOLN
10.0000 [IU] | Freq: Every day | SUBCUTANEOUS | Status: DC
  Filled 2014-08-16: qty 0.1

## 2014-08-16 MED ORDER — LEVOTHYROXINE SODIUM 50 MCG PO TABS
50.0000 ug | ORAL_TABLET | Freq: Every day | ORAL | Status: DC
  Administered 2014-08-17 – 2014-08-23 (×7): 50 ug via ORAL
  Filled 2014-08-16 (×8): qty 1

## 2014-08-16 MED ORDER — LEVOFLOXACIN 750 MG PO TABS
750.0000 mg | ORAL_TABLET | Freq: Every day | ORAL | Status: AC
  Administered 2014-08-16 – 2014-08-20 (×5): 750 mg via ORAL
  Filled 2014-08-16 (×5): qty 1

## 2014-08-16 MED ORDER — LEVOFLOXACIN 750 MG PO TABS
750.0000 mg | ORAL_TABLET | Freq: Every evening | ORAL | Status: DC
  Filled 2014-08-16: qty 1

## 2014-08-16 MED ORDER — LISINOPRIL 10 MG PO TABS: 10.0000 mg | ORAL_TABLET | Freq: Every day | ORAL | Status: DC

## 2014-08-16 MED ORDER — METFORMIN HCL 500 MG PO TABS: 500.0000 mg | ORAL_TABLET | Freq: Two times a day (BID) | ORAL | Status: DC

## 2014-08-16 MED ORDER — ALUM & MAG HYDROXIDE-SIMETH 200-200-20 MG/5ML PO SUSP: 30.0000 mL | ORAL | Status: DC | PRN

## 2014-08-16 MED ORDER — SODIUM CHLORIDE 0.45 % IV SOLN: INTRAVENOUS | Status: DC

## 2014-08-16 MED ORDER — LEVOFLOXACIN 750 MG PO TABS: 750.0000 mg | ORAL_TABLET | Freq: Every day | ORAL | Status: DC

## 2014-08-16 MED ORDER — MUPIROCIN 2 % EX OINT
1.0000 "application " | TOPICAL_OINTMENT | Freq: Two times a day (BID) | CUTANEOUS | Status: AC
  Administered 2014-08-16 – 2014-08-21 (×10): 1 via NASAL
  Filled 2014-08-16: qty 22

## 2014-08-16 MED ORDER — BISACODYL 10 MG RE SUPP: 10.0000 mg | Freq: Every day | RECTAL | Status: DC | PRN

## 2014-08-16 MED ORDER — DIPHENHYDRAMINE HCL 12.5 MG/5ML PO ELIX: 12.5000 mg | ORAL_SOLUTION | Freq: Four times a day (QID) | ORAL | Status: DC | PRN

## 2014-08-16 MED ORDER — GLIMEPIRIDE 1 MG PO TABS
1.0000 mg | ORAL_TABLET | Freq: Every day | ORAL | Status: DC
  Filled 2014-08-16 (×2): qty 1

## 2014-08-16 MED ORDER — IPRATROPIUM-ALBUTEROL 0.5-2.5 (3) MG/3ML IN SOLN: 3.0000 mL | RESPIRATORY_TRACT | Status: DC | PRN

## 2014-08-16 MED ORDER — FREE WATER
300.0000 mL | Freq: Four times a day (QID) | Status: DC
  Administered 2014-08-16 – 2014-08-23 (×23): 300 mL via ORAL

## 2014-08-16 MED ORDER — INSULIN GLARGINE 100 UNIT/ML ~~LOC~~ SOLN: 20.0000 [IU] | Freq: Every day | SUBCUTANEOUS | Status: DC

## 2014-08-16 MED ORDER — TRAZODONE HCL 50 MG PO TABS: 25.0000 mg | ORAL_TABLET | Freq: Every evening | ORAL | Status: DC | PRN

## 2014-08-16 MED ORDER — INSULIN ASPART 100 UNIT/ML ~~LOC~~ SOLN
0.0000 [IU] | Freq: Three times a day (TID) | SUBCUTANEOUS | Status: DC
  Administered 2014-08-16: 5 [IU] via SUBCUTANEOUS
  Administered 2014-08-17: 8 [IU] via SUBCUTANEOUS
  Administered 2014-08-17 (×2): 3 [IU] via SUBCUTANEOUS
  Administered 2014-08-18 (×2): 5 [IU] via SUBCUTANEOUS
  Administered 2014-08-18: 3 [IU] via SUBCUTANEOUS
  Administered 2014-08-19: 5 [IU] via SUBCUTANEOUS
  Administered 2014-08-19 – 2014-08-20 (×3): 3 [IU] via SUBCUTANEOUS
  Administered 2014-08-20: 2 [IU] via SUBCUTANEOUS
  Administered 2014-08-20: 3 [IU] via SUBCUTANEOUS
  Administered 2014-08-21 (×2): 2 [IU] via SUBCUTANEOUS
  Administered 2014-08-21 – 2014-08-23 (×4): 3 [IU] via SUBCUTANEOUS

## 2014-08-16 MED ORDER — INSULIN ASPART 100 UNIT/ML ~~LOC~~ SOLN
0.0000 [IU] | Freq: Every day | SUBCUTANEOUS | Status: DC
  Administered 2014-08-16: 22:00:00 via SUBCUTANEOUS
  Administered 2014-08-19: 2 [IU] via SUBCUTANEOUS

## 2014-08-16 MED ORDER — FREE WATER
300.0000 mL | Freq: Four times a day (QID) | Status: DC
  Administered 2014-08-16 (×2): 300 mL via ORAL

## 2014-08-16 MED ORDER — OCUVITE PO TABS
1.0000 | ORAL_TABLET | Freq: Every day | ORAL | Status: DC
  Administered 2014-08-17 – 2014-08-23 (×7): 1 via ORAL
  Filled 2014-08-16 (×8): qty 1

## 2014-08-16 MED ORDER — ASPIRIN 81 MG PO CHEW
81.0000 mg | CHEWABLE_TABLET | Freq: Every day | ORAL | Status: DC
  Administered 2014-08-17 – 2014-08-23 (×7): 81 mg via ORAL
  Filled 2014-08-16 (×8): qty 1

## 2014-08-16 MED ORDER — POLYETHYLENE GLYCOL 3350 17 G PO PACK
17.0000 g | PACK | Freq: Every day | ORAL | Status: DC | PRN
  Filled 2014-08-16: qty 1

## 2014-08-16 MED ORDER — ACETAMINOPHEN 325 MG PO TABS: 325.0000 mg | ORAL_TABLET | ORAL | Status: DC | PRN

## 2014-08-16 MED ORDER — PROCHLORPERAZINE 25 MG RE SUPP
12.5000 mg | Freq: Four times a day (QID) | RECTAL | Status: DC | PRN
  Filled 2014-08-16: qty 1

## 2014-08-16 MED ORDER — GUAIFENESIN-DM 100-10 MG/5ML PO SYRP: 5.0000 mL | ORAL_SOLUTION | Freq: Four times a day (QID) | ORAL | Status: DC | PRN

## 2014-08-16 MED ORDER — FLEET ENEMA 7-19 GM/118ML RE ENEM: 1.0000 | ENEMA | Freq: Once | RECTAL | Status: AC | PRN

## 2014-08-16 MED ORDER — POTASSIUM CHLORIDE CRYS ER 20 MEQ PO TBCR
20.0000 meq | EXTENDED_RELEASE_TABLET | Freq: Two times a day (BID) | ORAL | Status: DC
  Administered 2014-08-16 – 2014-08-23 (×14): 20 meq via ORAL
  Filled 2014-08-16 (×17): qty 1

## 2014-08-16 MED ORDER — DEXTROSE 50 % IV SOLN: 25.0000 mL | INTRAVENOUS | Status: DC | PRN

## 2014-08-16 MED ORDER — POLYETHYLENE GLYCOL 3350 17 G PO PACK
17.0000 g | PACK | Freq: Every day | ORAL | Status: DC
  Administered 2014-08-17 – 2014-08-23 (×7): 17 g via ORAL
  Filled 2014-08-16 (×10): qty 1

## 2014-08-16 MED ORDER — HYDROCORTISONE ACETATE 25 MG RE SUPP
25.0000 mg | Freq: Two times a day (BID) | RECTAL | Status: AC
  Administered 2014-08-16 – 2014-08-19 (×6): 25 mg via RECTAL
  Filled 2014-08-16 (×6): qty 1

## 2014-08-16 MED ORDER — PROCHLORPERAZINE EDISYLATE 5 MG/ML IJ SOLN
5.0000 mg | Freq: Four times a day (QID) | INTRAMUSCULAR | Status: DC | PRN
  Filled 2014-08-16: qty 2

## 2014-08-16 MED ORDER — PROCHLORPERAZINE MALEATE 5 MG PO TABS
5.0000 mg | ORAL_TABLET | Freq: Four times a day (QID) | ORAL | Status: DC | PRN
  Filled 2014-08-16: qty 2

## 2014-08-16 MED ORDER — DM-GUAIFENESIN ER 30-600 MG PO TB12
1.0000 | ORAL_TABLET | Freq: Two times a day (BID) | ORAL | Status: DC
  Administered 2014-08-16 – 2014-08-23 (×14): 1 via ORAL
  Filled 2014-08-16 (×16): qty 1

## 2014-08-16 MED ORDER — INSULIN GLARGINE 100 UNIT/ML ~~LOC~~ SOLN
10.0000 [IU] | Freq: Every day | SUBCUTANEOUS | Status: AC
  Administered 2014-08-16 – 2014-08-18 (×3): 10 [IU] via SUBCUTANEOUS
  Filled 2014-08-16 (×3): qty 0.1

## 2014-08-16 MED ORDER — ENOXAPARIN SODIUM 40 MG/0.4ML ~~LOC~~ SOLN
40.0000 mg | SUBCUTANEOUS | Status: DC
  Administered 2014-08-16 – 2014-08-22 (×7): 40 mg via SUBCUTANEOUS
  Filled 2014-08-16 (×8): qty 0.4

## 2014-08-16 NOTE — Progress Notes (Signed)
Rehab admissions - Evaluated for possible admission.  I met with patient.  She is agreeable to inpatient rehab admission.  Bed available and will admit to acute inpatient rehab today.  Call me for questions.  #[phone]

## 2014-08-16 NOTE — Progress Notes (Signed)
08/16/14 patient going to in house rehab when ready, waiting on bed to transfer to 4000.

## 2014-08-16 NOTE — Progress Notes (Signed)
PT Cancellation Note  Patient Details Name: Catherine [name] MRN: [id number] DOB: 11-16-1946   Cancelled Treatment:    Reason Eval/Treat Not Completed: Medical issues which prohibited therapy; Pt with decr K+ (3.5). Will check back as schedule permits.   [facility] HospitalWILLIAMS,Terrye Dombrosky 08/16/2014, 1:30 PM

## 2014-08-16 NOTE — Care Management Note (Signed)
    Page 1 of 1   08/16/2014     2:13:33 PM CARE MANAGEMENT NOTE 08/16/2014  Patient:  Catherine [name],Catherine [name]   Account Number:  [id number]  Date Initiated:  08/16/2014  Documentation initiated by:  Letha CapeAYLOR,Carisha Kantor  Subjective/Objective Assessment:   dx hypotenstion  admit- lives with spouse.     Action/Plan:   pt eval- rec CIR.   Anticipated DC Date:  08/16/2014   Anticipated DC Plan:  IP REHAB FACILITY  In-house referral  Clinical Social Worker      DC Planning Services  CM consult      Choice offered to / List presented to:             Status of service:  Completed, signed off Medicare Important Message given?  YES (If response is "NO", the following Medicare IM given date fields will be blank) Date Medicare IM given:  08/16/2014 Medicare IM given by:  Letha CapeAYLOR,Diar Berkel Date Additional Medicare IM given:   Additional Medicare IM given by:    Discharge Disposition:  IP REHAB FACILITY  Per UR Regulation:  Reviewed for med. necessity/level of care/duration of stay  If discussed at Long Length of Stay Meetings, dates discussed:    Comments:  08/16/14 1412 Letha CapeDeborah Kaylana Fenstermacher RN,BSN [phone] patient is for dc to CIR today.

## 2014-08-16 NOTE — H&P (Signed)
Physical Medicine and Rehabilitation Admission H&P  Chief Complaint   Patient presents with   .  Debilitated in setting of bilateral pneumonia complicated by HONK with encephalopathy   HPI: Ms. Catherine [name] is a 68 year old female with history of DM type 2, HTN, hypothyroid who was admitted on 08/13/14 with one week history of weakness with difficulty walking as well as 2 day history of productive cough with lethargy and slurred speech. She was found to have bilateral CAP with acute renal failure with BUN/Cr- 83/2.0, BS - 514 and CK 4435. She was started on IV antibiotics, gentle hydration as well as as IV insulin. Lisinopril and Diuretics d/c. UDS negative. Lactate normal. Patient with abnormal LFTs and Ammonia levels elevated at 89. TSH-1.090. MRI brain without acute abnormality and mild chronic small vessel disease. Lethargy has resolved but patient continue to have delayed processing with difficulty following simple commands. Swallow evaluation with mild oral holding and patient placed on dysphagia 3, thin liquids. She continues ot have cough and follow up CXR revealed worsening aeration RUL with multifocal PNA. The patient was screened by the inpatient rehab team, and ultimately she was admitted to CIR.   Review of Systems  HENT: Negative for hearing loss.  Eyes: Negative for blurred vision and double vision.  Respiratory: Positive for cough (improving). Negative for shortness of breath and wheezing.  Cardiovascular: Negative for chest pain and palpitations.  Gastrointestinal: Negative for heartburn, nausea and constipation.  Genitourinary: Negative for urgency and frequency.  Musculoskeletal: Negative for joint pain and myalgias.  Neurological: Positive for weakness. Negative for headaches.  Psychiatric/Behavioral: Negative for depression. The patient is not nervous/anxious.   Past Medical History   Diagnosis  Date   .  Diabetes mellitus without complication    .  Hypertension    .   Thyroid disease     Past Surgical History   Procedure  Laterality  Date   .  Cholecystectomy      Family History   Problem  Relation  Age of Onset   .  Diabetes Mellitus II  Mother    .  Stroke  Mother    .  Diabetes Mellitus II  Sister    .  Hypertension  Sister     Social History: reports that she has never smoked. She does not have any smokeless tobacco history on file. She reports that she does not drink alcohol or use illicit drugs.  Allergies: No Known Allergies  Medications Prior to Admission   Medication  Sig  Dispense  Refill   .  aspirin 81 MG tablet  Take 81 mg by mouth daily.     [name] Kitchen  atorvastatin (LIPITOR) 10 MG tablet  Take 10 mg by mouth daily.     .  beta carotene w/minerals (OCUVITE) tablet  Take 1 tablet by mouth daily.     .  Cyanocobalamin (VITAMIN B 12 PO)  Take 1,000 mg by mouth daily.     [name] Kitchen  levothyroxine (SYNTHROID, LEVOTHROID) 50 MCG tablet  Take 50 mcg by mouth daily before breakfast.     .  lisinopril-hydrochlorothiazide (PRINZIDE,ZESTORETIC) 20-12.5 MG per tablet  Take 1 tablet by mouth daily.     .  Saxagliptin-Metformin (KOMBIGLYZE XR) 2.03-999 MG TB24  Take 1 tablet by mouth daily.      Home:  Home Living  Family/patient expects to be discharged to:: Private residence  Living Arrangements: Spouse/significant other  Available Help at Discharge: Family;Available 24 hours/day  Type  of Home: House  Home Access: Stairs to enter  [employer] of Steps: 1  Home Layout: One level  Home Equipment: None  Additional Comments: husband answering questions for pt, although pt capable  Functional History:  Prior Function  Level of Independence: Independent  Comments: pt reports she drives and does IADL  Functional Status:  Mobility:  Bed Mobility  Overal bed mobility: Needs Assistance  Bed Mobility: Supine to Sit;Sit to Supine  Supine to sit: Mod assist  Sit to supine: Mod assist  General bed mobility comments: moves slowly due to lethargy,  assist for LEs and to guide trunk  Transfers  Overall transfer level: Needs assistance  Equipment used: 1 person hand held assist  Transfers: Sit to/from Stand  Sit to Stand: Min assist  General transfer comment: assist to steady, pt able to power up, extra time  Ambulation/Gait  General Gait Details: Deferred due to pt fatigue and unsteadiness.   ADL:  ADL  Overall ADL's : Needs assistance/impaired  Eating/Feeding: NPO  Grooming: Wash/dry face;Set up;Sitting  Upper Body Dressing : Minimal assistance;Sitting (supported sitting)  Lower Body Dressing: Sit to/from stand;Moderate assistance  Lower Body Dressing Details (indicate cue type and reason): pt with minimal attempt to donn socks, appearing fatigued  Toilet Transfer: Minimal assistance;Stand-pivot;BSC  Toileting- Clothing Manipulation and Hygiene: Sitting/lateral lean;Minimal assistance  General ADL Comments: Pt with generalized weakness and lethargy.  Cognition:  Cognition  Overall Cognitive Status: Difficult to assess  Arousal/Alertness: Awake/alert  Orientation Level: Oriented X4  Attention: Sustained  Sustained Attention: Impaired  Sustained Attention Impairment: Verbal basic;Functional basic  Memory: Impaired  Memory Impairment: Decreased recall of new information  Awareness: Impaired  Awareness Impairment: Intellectual impairment  Problem Solving: Impaired  Problem Solving Impairment: Functional basic  Safety/Judgment: Impaired  Cognition  Arousal/Alertness: Lethargic  Behavior During Therapy: Flat affect  Overall Cognitive Status: Difficult to assess  Difficult to assess due to: Level of arousal   Physical Exam:    Physical Exam  Constitutional: She appears well-developed.  HENT: oral mucosa moist, pink Head: Normocephalic.  Eyes: EOM are normal.  Neck: Normal range of motion. Neck supple. No thyromegaly present.  Cardiovascular: Normal rate and regular rhythm. No murmurs, rubs, gallops Respiratory:    Decreased breath sounds at the bases but clear to auscultation. No wheezes, rales, rhonchi GI: Soft. Bowel sounds are normal. She exhibits no distension.  Neurological:  Pt is alert/oriented to name,hospital, month. Follows simple commands. A little irritable. Comprehends on very basic level. Strength 4/5 UE's prox to distal. LE's: 3+HF, 4/5 ke, adf/apf. No sensory loss. Good sitting balance, dtr's 1+.  Skin: Skin is warm and dry.  Psych: a little irritable, cooperative   Results for orders placed during the hospital encounter of 08/13/14 (from the past 48 hour(s))   GLUCOSE, CAPILLARY Status: Abnormal    Collection Time    08/15/14 12:10 AM   Result  Value  Ref Range    Glucose-Capillary  202 (*)  70 - 99 mg/dL   GLUCOSE, CAPILLARY Status: Abnormal    Collection Time    08/15/14 1:01 AM   Result  Value  Ref Range    Glucose-Capillary  184 (*)  70 - 99 mg/dL   CBC WITH DIFFERENTIAL Status: Abnormal    Collection Time    08/15/14 1:37 AM   Result  Value  Ref Range    WBC  8.3  4.0 - 10.5 K/uL    RBC  2.65 (*)  3.87 - 5.11  MIL/uL    Hemoglobin  7.8 (*)  12.0 - 15.0 g/dL    HCT  22.9 (*)  36.0 - 46.0 %    MCV  86.4  78.0 - 100.0 fL    MCH  29.4  26.0 - 34.0 pg    MCHC  34.1  30.0 - 36.0 g/dL    RDW  14.5  11.5 - 15.5 %    Platelets  256  150 - 400 K/uL    Neutrophils Relative %  95 (*)  43 - 77 %    Lymphocytes Relative  4 (*)  12 - 46 %    Monocytes Relative  1 (*)  3 - 12 %    Eosinophils Relative  0  0 - 5 %    Basophils Relative  0  0 - 1 %    Neutro Abs  7.9 (*)  1.7 - 7.7 K/uL    Lymphs Abs  0.3 (*)  0.7 - 4.0 K/uL    Monocytes Absolute  0.1  0.1 - 1.0 K/uL    Eosinophils Absolute  0.0  0.0 - 0.7 K/uL    Basophils Absolute  0.0  0.0 - 0.1 K/uL    WBC Morphology  TOXIC GRANULATION    COMPREHENSIVE METABOLIC PANEL Status: Abnormal    Collection Time    08/15/14 1:37 AM   Result  Value  Ref Range    Sodium  150 (*)  137 - 147 mEq/L    Potassium  3.3 (*)  3.7 - 5.3  mEq/L    Chloride  117 (*)  96 - 112 mEq/L    CO2  20  19 - 32 mEq/L    Glucose, Bld  195 (*)  70 - 99 mg/dL    BUN  36 (*)  6 - 23 mg/dL    Creatinine, Ser  1.18 (*)  0.50 - 1.10 mg/dL    Calcium  8.5  8.4 - 10.5 mg/dL    Total Protein  6.2  6.0 - 8.3 g/dL    Albumin  1.9 (*)  3.5 - 5.2 g/dL    AST  77 (*)  0 - 37 U/L    ALT  63 (*)  0 - 35 U/L    Alkaline Phosphatase  71  39 - 117 U/L    Total Bilirubin  0.2 (*)  0.3 - 1.2 mg/dL    GFR calc non Af Amer  47 (*)  >90 mL/min    GFR calc Af Amer  54 (*)  >90 mL/min    Comment:  (NOTE)     The eGFR has been calculated using the CKD EPI equation.     This calculation has not been validated in all clinical situations.     eGFR's persistently <90 mL/min signify possible Chronic Kidney     Disease.    Anion gap  13  5 - 15   LACTIC ACID, PLASMA Status: None    Collection Time    08/15/14 1:37 AM   Result  Value  Ref Range    Lactic Acid, Venous  1.8  0.5 - 2.2 mmol/L   PROCALCITONIN Status: None    Collection Time    08/15/14 1:37 AM   Result  Value  Ref Range    Procalcitonin  0.19     Comment:      Interpretation:     PCT (Procalcitonin) <= 0.5 ng/mL:     Systemic infection (sepsis) is not likely.  Local bacterial infection is possible.     (NOTE)     ICU PCT Algorithm Non ICU PCT Algorithm     ---------------------------- ------------------------------     PCT < 0.25 ng/mL PCT < 0.1 ng/mL     Stopping of antibiotics Stopping of antibiotics     strongly encouraged. strongly encouraged.     ---------------------------- ------------------------------     PCT level decrease by PCT < 0.25 ng/mL     >= 80% from peak PCT     OR PCT 0.25 - 0.5 ng/mL Stopping of antibiotics     encouraged.     Stopping of antibiotics     encouraged.     ---------------------------- ------------------------------     PCT level decrease by PCT >= 0.25 ng/mL     < 80% from peak PCT     AND PCT >= 0.5 ng/mL Continuing antibiotics      encouraged.     Continuing antibiotics     encouraged.     ---------------------------- ------------------------------     PCT level increase compared PCT > 0.5 ng/mL     with peak PCT AND     PCT >= 0.5 ng/mL Escalation of antibiotics     strongly encouraged.     Escalation of antibiotics     strongly encouraged.   MAGNESIUM Status: None    Collection Time    08/15/14 1:37 AM   Result  Value  Ref Range    Magnesium  2.0  1.5 - 2.5 mg/dL   BASIC METABOLIC PANEL Status: Abnormal    Collection Time    08/15/14 1:37 AM   Result  Value  Ref Range    Sodium  151 (*)  137 - 147 mEq/L    Potassium  3.3 (*)  3.7 - 5.3 mEq/L    Chloride  118 (*)  96 - 112 mEq/L    CO2  21  19 - 32 mEq/L    Glucose, Bld  198 (*)  70 - 99 mg/dL    BUN  36 (*)  6 - 23 mg/dL    Creatinine, Ser  1.19 (*)  0.50 - 1.10 mg/dL    Calcium  8.5  8.4 - 10.5 mg/dL    GFR calc non Af Amer  46 (*)  >90 mL/min    GFR calc Af Amer  54 (*)  >90 mL/min    Comment:  (NOTE)     The eGFR has been calculated using the CKD EPI equation.     This calculation has not been validated in all clinical situations.     eGFR's persistently <90 mL/min signify possible Chronic Kidney     Disease.    Anion gap  12  5 - 15   GLUCOSE, CAPILLARY Status: Abnormal    Collection Time    08/15/14 2:00 AM   Result  Value  Ref Range    Glucose-Capillary  194 (*)  70 - 99 mg/dL    Comment 1  Capillary Sample    GLUCOSE, CAPILLARY Status: Abnormal    Collection Time    08/15/14 3:05 AM   Result  Value  Ref Range    Glucose-Capillary  175 (*)  70 - 99 mg/dL   GLUCOSE, CAPILLARY Status: Abnormal    Collection Time    08/15/14 4:11 AM   Result  Value  Ref Range    Glucose-Capillary  180 (*)  70 - 99 mg/dL   GLUCOSE, CAPILLARY Status: Abnormal  Collection Time    08/15/14 5:03 AM   Result  Value  Ref Range    Glucose-Capillary  170 (*)  70 - 99 mg/dL    Comment 1  Capillary Sample    BASIC METABOLIC PANEL Status: Abnormal     Collection Time    08/15/14 5:16 AM   Result  Value  Ref Range    Sodium  150 (*)  137 - 147 mEq/L    Potassium  3.6 (*)  3.7 - 5.3 mEq/L    Chloride  117 (*)  96 - 112 mEq/L    CO2  20  19 - 32 mEq/L    Glucose, Bld  180 (*)  70 - 99 mg/dL    BUN  32 (*)  6 - 23 mg/dL    Creatinine, Ser  1.11 (*)  0.50 - 1.10 mg/dL    Calcium  8.4  8.4 - 10.5 mg/dL    GFR calc non Af Amer  50 (*)  >90 mL/min    GFR calc Af Amer  58 (*)  >90 mL/min    Comment:  (NOTE)     The eGFR has been calculated using the CKD EPI equation.     This calculation has not been validated in all clinical situations.     eGFR's persistently <90 mL/min signify possible Chronic Kidney     Disease.    Anion gap  13  5 - 15   GLUCOSE, CAPILLARY Status: Abnormal    Collection Time    08/15/14 5:58 AM   Result  Value  Ref Range    Glucose-Capillary  187 (*)  70 - 99 mg/dL    Comment 1  Capillary Sample    GLUCOSE, CAPILLARY Status: Abnormal    Collection Time    08/15/14 7:05 AM   Result  Value  Ref Range    Glucose-Capillary  180 (*)  70 - 99 mg/dL   GLUCOSE, CAPILLARY Status: Abnormal    Collection Time    08/15/14 7:54 AM   Result  Value  Ref Range    Glucose-Capillary  154 (*)  70 - 99 mg/dL    Comment 1  Capillary Sample    GLUCOSE, CAPILLARY Status: Abnormal    Collection Time    08/15/14 9:08 AM   Result  Value  Ref Range    Glucose-Capillary  164 (*)  70 - 99 mg/dL   GLUCOSE, CAPILLARY Status: Abnormal    Collection Time    08/15/14 9:43 AM   Result  Value  Ref Range    Glucose-Capillary  160 (*)  70 - 99 mg/dL   ACETAMINOPHEN LEVEL Status: None    Collection Time    08/15/14 10:55 AM   Result  Value  Ref Range    Acetaminophen (Tylenol), Serum  <15.0  10 - 30 ug/mL    Comment:      THERAPEUTIC CONCENTRATIONS VARY     SIGNIFICANTLY. A RANGE OF 10-30     ug/mL MAY BE AN EFFECTIVE     CONCENTRATION FOR MANY PATIENTS.     HOWEVER, SOME ARE BEST TREATED     AT CONCENTRATIONS OUTSIDE THIS      RANGE.     ACETAMINOPHEN CONCENTRATIONS     >150 ug/mL AT 4 HOURS AFTER     INGESTION AND >50 ug/mL AT 12     HOURS AFTER INGESTION ARE     OFTEN ASSOCIATED WITH TOXIC     REACTIONS.  CK Status: Abnormal    Collection Time    08/15/14 10:55 AM   Result  Value  Ref Range    Total CK  1937 (*)  7 - 177 U/L   GLUCOSE, CAPILLARY Status: Abnormal    Collection Time    08/15/14 11:38 AM   Result  Value  Ref Range    Glucose-Capillary  192 (*)  70 - 99 mg/dL   GLUCOSE, CAPILLARY Status: Abnormal    Collection Time    08/15/14 6:37 PM   Result  Value  Ref Range    Glucose-Capillary  302 (*)  70 - 99 mg/dL   GLUCOSE, CAPILLARY Status: Abnormal    Collection Time    08/15/14 9:29 PM   Result  Value  Ref Range    Glucose-Capillary  291 (*)  70 - 99 mg/dL   CBC Status: Abnormal    Collection Time    08/16/14 7:35 AM   Result  Value  Ref Range    WBC  6.2  4.0 - 10.5 K/uL    RBC  3.08 (*)  3.87 - 5.11 MIL/uL    Hemoglobin  9.0 (*)  12.0 - 15.0 g/dL    HCT  26.1 (*)  36.0 - 46.0 %    MCV  84.7  78.0 - 100.0 fL    MCH  29.2  26.0 - 34.0 pg    MCHC  34.5  30.0 - 36.0 g/dL    RDW  14.1  11.5 - 15.5 %    Platelets  257  150 - 400 K/uL   COMPREHENSIVE METABOLIC PANEL Status: Abnormal    Collection Time    08/16/14 7:35 AM   Result  Value  Ref Range    Sodium  143  137 - 147 mEq/L    Potassium  3.5 (*)  3.7 - 5.3 mEq/L    Chloride  108  96 - 112 mEq/L    CO2  22  19 - 32 mEq/L    Glucose, Bld  261 (*)  70 - 99 mg/dL    BUN  19  6 - 23 mg/dL    Creatinine, Ser  0.96  0.50 - 1.10 mg/dL    Calcium  8.2 (*)  8.4 - 10.5 mg/dL    Total Protein  6.4  6.0 - 8.3 g/dL    Albumin  2.0 (*)  3.5 - 5.2 g/dL    AST  63 (*)  0 - 37 U/L    ALT  66 (*)  0 - 35 U/L    Alkaline Phosphatase  73  39 - 117 U/L    Total Bilirubin  0.5  0.3 - 1.2 mg/dL    GFR calc non Af Amer  60 (*)  >90 mL/min    GFR calc Af Amer  69 (*)  >90 mL/min    Comment:  (NOTE)     The eGFR has been calculated using the  CKD EPI equation.     This calculation has not been validated in all clinical situations.     eGFR's persistently <90 mL/min signify possible Chronic Kidney     Disease.    Anion gap  13  5 - 15   CK Status: Abnormal    Collection Time    08/16/14 7:35 AM   Result  Value  Ref Range    Total CK  1245 (*)  7 - 177 U/L   AMMONIA Status: None    Collection Time  08/16/14 7:35 AM   Result  Value  Ref Range    Ammonia  19  11 - 60 umol/L   GLUCOSE, CAPILLARY Status: Abnormal    Collection Time    08/16/14 7:48 AM   Result  Value  Ref Range    Glucose-Capillary  255 (*)  70 - 99 mg/dL   GLUCOSE, CAPILLARY Status: Abnormal    Collection Time    08/16/14 11:54 AM   Result  Value  Ref Range    Glucose-Capillary  199 (*)  70 - 99 mg/dL   Dg Chest 2 View  08/16/2014 CLINICAL DATA: Increasing lethargy and cough. EXAM: CHEST 2 VIEW COMPARISON: 08/13/2014. FINDINGS: Heart size is mildly enlarged. No pleural effusion identified. Bilateral airspace opacities are identified. When compared with the previous exam there is worsening aeration to the right upper lobe. IMPRESSION: 1. Bilateral airspace opacities compatible with multifocal pneumonia. 2. Worsening aeration to the right upper lobe. Electronically Signed By: Kerby Moors M.D. On: 08/16/2014 10:42   Medical Problem List and Plan:  1. Functional deficits secondary to Deconditioning complicated by bilateral PNA and hyperosmotic non-ketotic coma 2. DVT Prophylaxis/Anticoagulation: Pharmaceutical: Lovenox  3. Pain Management: N/A  4. Mood: LCSW to follow for evaluation and support.  5. Neuropsych: This patient is capable of making decisions on her own behalf.  6. Skin/Wound Care: Routine pressure relief measures.  7. Fluids/Electrolytes/Nutrition:Monitor I/O. Encourage fluid intake. Follow up labs in am.  9. DM type 2: HGB A1c-7.5. Was on Kombiglyze XR at home. Was Started on Lantus with SSI for elevated BS. Will resume sitagliptin and amaryl  and wean off lantus. Avoid metformin  10. Anemia: Will recheck in am. Has low iron stores with elevated ferritin. Likely needs IV to help with absorption. Recheck in am. Reporting rectal bleeding due to hems. Has history of heme positive stools with long standing constipation--negative colonoscopy by Dr. Collene Mares 05/2003 and likely needs follow up.  11. Acute renal failure: Has resolved with IV fluids as well as medication adjustment. Continue to monitor.  12. Hypokalemia: Dilutional due to IVF. Will supplement today and recheck in am.  13. B-CAP: IV antibiotics d/c. Continue Levaquin D# 1/5 to complete antibiotic regimen.  14. HTN: will monitor every 8 hours. Continue to hold HCTZ. Monitor renal status with resumption of lisinopril 10 mg daily.  Post Admission Physician Evaluation:  1. Functional deficits secondary to Deconditioning complicated by pneumonia and HONK.  2. Patient is admitted to receive collaborative, interdisciplinary care between the physiatrist, rehab nursing staff, and therapy team. 3. Patient's level of medical complexity and substantial therapy needs in context of that medical necessity cannot be provided at a lesser intensity of care such as a SNF. 4. Patient has experienced substantial functional loss from his/her baseline which was documented above under the "Functional History" and "Functional Status" headings. Judging by the patient's diagnosis, physical exam, and functional history, the patient has potential for functional progress which will result in measurable gains while on inpatient rehab. These gains will be of substantial and practical use upon discharge in facilitating mobility and self-care at the household level. 5. Physiatrist will provide 24 hour management of medical needs as well as oversight of the therapy plan/treatment and provide guidance as appropriate regarding the interaction of the two. 6. 24 hour rehab nursing will assist with bladder management, bowel  management, safety, skin/wound care, disease management, medication administration, pain management and patient education and help integrate therapy concepts, techniques,education, etc. 7. PT will assess and  treat for/with: Lower extremity strength, range of motion, stamina, balance, functional mobility, safety, adaptive techniques and equipment, NMR, cognitive perceptual awareness, leisure awareness. Goals are: mod I to supervision. 8. OT will assess and treat for/with: ADL's, functional mobility, safety, upper extremity strength, adaptive techniques and equipment, NMR, cognitive perceptual awareness, community reintegration. Goals are: mod I to supervision. Therapy may proceed with showering this patient. 9. SLP will assess and treat for/with: cognition, communication. Goals are: mod I to supervision. 10. Case Management and Social Worker will assess and treat for psychological issues and discharge planning. 11. Team conference will be held weekly to assess progress toward goals and to determine barriers to discharge. 12. Patient will receive at least 3 hours of therapy per day at least 5 days per week. 13. ELOS: 10-13 days  14. Prognosis: excellent  Meredith Staggers, MD, [facility] 08/16/2014

## 2014-08-16 NOTE — Discharge Summary (Signed)
Physician Discharge Summary  Catherine [name]:[id number] DOB: 19-Apr-1946 DOA: 08/13/2014  PCP: Gwynneth Aliment, MD  Admit date: 08/13/2014 Discharge date: 08/16/2014  Time spent: 40 minutes  Recommendations for Outpatient Follow-up:  1. Followup with primary care physician in one week. Continue levofloxacin 750 mg for 5 more days. 2. Followup on CBGs and adjust Lantus insulin as appropriate. 3. Check CMP within one week, followup on elevated LFTs  Discharge Diagnoses:  Active Problems:   Pneumonia   DKA, type 2   Acute renal failure   Hypernatremia   Elevated LFTs   Unspecified hypothyroidism   DKA (diabetic ketoacidoses)   Discharge Condition: Stable  Diet recommendation: carbohydrate modified diet  Filed Weights   08/13/14 1730 08/13/14 2050 08/16/14 8119  Weight: 77.111 kg (170 lb) 74.9 kg (165 lb 2 oz) 75 kg (165 lb 5.5 oz)    History of present illness:  Catherine [name] is a 68 y.o. female with history of diabetes mellitus, hypertension, hypothyroidism has been feeling increasingly weak to the point patient has been feeling difficult to walk over the last one week. Patient also has been having some nonproductive cough over the last 2 days. Denies any nausea vomiting abdominal pain diarrhea fever chills. Patient's husband who provided most of the history states that patient also was having some slurred speech last 2 days. Patient did not lose consciousness but has been quite lethargic. In the ER patient is sleepy but answers questions appropriately. Has not taken any new medications or narcotics and has not had any recent travel or sick contact as per the husband. In the ER labs reveal elevated blood sugar with anion gap with chest x-ray showing possible bilateral pneumonia. On exam patient is nonfocal except the patient is lethargic. Patient is mildly febrile. Given the patient's elevated blood sugar and the anion gap patient has been started on insulin infusion for possible  DKA and antibiotics for pneumonia. Labs also reveal increased creatinine and baseline is not known. Patient is yet to give a urine sample.   Hospital Course:   Bibasilar CAP  Patient given to the hospital with some cough, and feeling weak, chest x-ray showed bibasilar pneumonia. Patient started on IV antibiotics, supportive management with oxygen, bronchodilators and mucolytics. Vital screen including pneumonia negative, Streptococcus and Legionella antigens negative in the urine. Initially started on steroids, discontinued because of no wheezing. At the time of discharge Levaquin 750 for 5 more days prescribed.  Sepsis At the time of the presentation patient has fever of 101.3, heart rate of 104 and respiratory of 20. With the presence of pneumonia disqualifies patient for sepsis. Sputum and blood cultures obtained at the time of admission, broad-spectrum antibiotics started.  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 No DKA, patient does have hyperosmolar hyperglycemic state. Hemoglobin A1c of 7.5 which correlate with mean plasma glucose of 169 indicates uncontrolled DM 2. Patient started on Lantus insulin, started on 12 units at daytime and increase to 20 units on discharge. Please follow up on CBGs and adjust Lantus insulin accordingly.  Acute encephalopathy  Most likely multifactorial to include HONK, acute renal failure, hypernatremia, hepatic encephalopathy - CT/MRI head negative - mental status improved  1/2 Positive blood cx - c/w staph  1/4 positive blood cultures for coagulase-negative staph, probably contaminant.  Acute renal failure  Crt 2.7 at presentation - baseline unknown - hold ACE inhibitors and diuretics  and gently hydrate. At the time of discharge creatinine is 0.96. Lisinopril decreased from 20 to10 mg and discontinued  hydrochlorothiazide.  Normocytic anemia  baseline Hgb unknown - Fe <10 - B12 and folate normal - at very least will need screening colo (could be done as outpt) - Hgb steadily dropping w/ hydration - may actually prove to need transfusion while here.  UTI  UA not convincing - should be covered regardless by abx for PNA - f/u culture   Hypernatremia  Most c/w hypovolemia - cont w/ free water resuscitation Presented with sodium of 152, patient was getting free water and sodium improved to 143 at the time of discharge.  Hypothyroidism  TSH at goal of 1.0 - continue Synthroid   Hypertension  Lisinopril and diuretics held due to renal failure - BP currently reasonably controlled. At the time of discharge lisinopril restarted at lower dose of 10 mg.  Elevated LFTs - Elevated ammonia - Suspected Shock Liver  Unclear etiology, suspect shocked liver. Hold Lipitor, viral hepatitis panel is negative, no history of excessive drinking. Follow as outpatient, recheck CMP within one week.   Moderate Rhabdomyolysis  CK at presentation 4435 - cont to hydrate and follow.     Procedures:  None  Consultations:  None  Discharge Exam: Filed Vitals:   08/16/14 1302  BP: 147/64  Pulse: 69  Temp: 98.8 F (37.1 C)  Resp: 16   General: Alert and awake, oriented x3, not in any acute distress. HEENT: anicteric sclera, pupils reactive to light and accommodation, EOMI CVS: S1-S2 clear, no murmur rubs or gallops Chest: clear to auscultation bilaterally, no wheezing, rales or rhonchi Abdomen: soft nontender, nondistended, normal bowel sounds, no organomegaly Extremities: no cyanosis, clubbing or edema noted bilaterally Neuro: Cranial nerves II-XII intact, no focal neurological deficits  Discharge Instructions You were cared for by a hospitalist during your hospital stay. If you have any questions about your discharge medications or the care you received while you were in the hospital after you  are discharged, you can call the unit and asked to speak with the hospitalist on call if the hospitalist that took care of you is not available. Once you are discharged, your primary care physician will handle any further medical issues. Please note that NO REFILLS for any discharge medications will be authorized once you are discharged, as it is imperative that you return to your primary care physician (or establish a relationship with a primary care physician if you do not have one) for your aftercare needs so that they can reassess your need for medications and monitor your lab values.  Discharge Instructions   Diet Carb Modified    Complete by:  As directed      Increase activity slowly    Complete by:  As directed           Current Discharge Medication List    START taking these medications   Details  insulin glargine (LANTUS) 100 UNIT/ML injection Inject 0.2 mLs (20 Units total) into the skin daily. Qty: 10 mL, Refills: 11    levofloxacin (LEVAQUIN) 750 MG tablet Take 1 tablet (750 mg total) by mouth daily. Qty: 5 tablet, Refills: 0    lisinopril (PRINIVIL) 10 MG tablet Take 1 tablet (10 mg total) by mouth daily.    metFORMIN (GLUCOPHAGE) 500 MG tablet Take 1 tablet (500 mg total) by mouth 2 (two) times daily with a meal.      CONTINUE these medications which have NOT CHANGED  Details  aspirin 81 MG tablet Take 81 mg by mouth daily.    beta carotene w/minerals (OCUVITE) tablet Take 1 tablet by mouth daily.    Cyanocobalamin (VITAMIN B 12 PO) Take 1,000 mg by mouth daily.    levothyroxine (SYNTHROID, LEVOTHROID) 50 MCG tablet Take 50 mcg by mouth daily before breakfast.      STOP taking these medications     atorvastatin (LIPITOR) 10 MG tablet      lisinopril-hydrochlorothiazide (PRINZIDE,ZESTORETIC) 20-12.5 MG per tablet      Saxagliptin-Metformin (KOMBIGLYZE XR) 2.03-999 MG TB24        No Known Allergies    The results of significant diagnostics from this  hospitalization (including imaging, microbiology, ancillary and laboratory) are listed below for reference.    Significant Diagnostic Studies: Dg Chest 2 View  08/16/2014   CLINICAL DATA:  Increasing lethargy and cough.  EXAM: CHEST  2 VIEW  COMPARISON:  08/13/2014.  FINDINGS: Heart size is mildly enlarged. No pleural effusion identified. Bilateral airspace opacities are identified. When compared with the previous exam there is worsening aeration to the right upper lobe.  IMPRESSION: 1. Bilateral airspace opacities compatible with multifocal pneumonia. 2. Worsening aeration to the right upper lobe.   Electronically Signed   By: Signa Kellaylor  Stroud M.D.   On: 08/16/2014 10:42   Ct Head Wo Contrast  08/13/2014   CLINICAL DATA:  Hypotensive, general malaise, tachycardia, hyperglycemic  EXAM: CT HEAD WITHOUT CONTRAST  TECHNIQUE: Contiguous axial images were obtained from the base of the skull through the vertex without intravenous contrast.  COMPARISON:  None.  FINDINGS: No evidence of parenchymal hemorrhage or extra-axial fluid collection. No mass lesion, mass effect, or midline shift.  No CT evidence of acute infarction.  Cerebral volume is within normal limits.  No ventriculomegaly.  Partial opacification of the right frontal and ethmoid sinuses. The mastoid air cells are unopacified.  No evidence of calvarial fracture.  IMPRESSION: No evidence of acute intracranial abnormality.   Electronically Signed   By: Charline BillsSriyesh  Krishnan M.D.   On: 08/13/2014 20:29   Mr Brain Wo Contrast  08/14/2014   CLINICAL DATA:  Slurred speech and weakness.  EXAM: MRI HEAD WITHOUT CONTRAST  TECHNIQUE: Multiplanar, multiecho pulse sequences of the brain and surrounding structures were obtained without intravenous contrast.  COMPARISON:  Head CT 08/13/2014  FINDINGS: There is no evidence of acute infarct, intracranial hemorrhage, mass, midline shift, or extra-axial fluid collection. Scattered, small foci of T2 hyperintensity in the  subcortical and deep cerebral white matter and brainstem are nonspecific but compatible with mild chronic small vessel ischemic disease. There is mild generalized cerebral atrophy, within normal limits for age.  Orbits are unremarkable. Partial right sphenoid sinus opacification is noted as well as mild right inferior frontal sinus and right anterior ethmoid air cell mucosal thickening. Trace right mastoid fluid. Major intracranial vascular flow voids are preserved.  IMPRESSION: 1. No acute intracranial abnormality. 2. Mild chronic small vessel ischemic disease.   Electronically Signed   By: Sebastian AcheAllen  Grady   On: 08/14/2014 12:12   Dg Chest Port 1 View  08/13/2014   CLINICAL DATA:  Fever and cough ; hypoxia  EXAM: PORTABLE CHEST - 1 VIEW  COMPARISON:  None.  FINDINGS: There is patchy alveolar opacity in both mid and lower lung zones. Heart is upper normal in size with pulmonary vascularity within normal limits. No effusions. No adenopathy. No bone lesions.  IMPRESSION: Bilateral mid and lower lung zone opacity. Suspect bilateral pneumonia  given the appearance, although a slightly atypical appearance of congestive heart failure could present in this manner. Both entities may exist concurrently. No adenopathy apparent.   Electronically Signed   By: Bretta Bang M.D.   On: 08/13/2014 18:30    Microbiology: Recent Results (from the past 240 hour(s))  CULTURE, BLOOD (ROUTINE X 2)     Status: None   Collection Time    08/13/14  6:15 PM      Result Value Ref Range Status   Specimen Description BLOOD ARM RIGHT   Final   Special Requests BOTTLES DRAWN AEROBIC AND ANAEROBIC 10CC   Final   Culture  Setup Time     Final   Value: 08/13/2014 22:09     Performed at [facility]   Culture     Final   Value: STAPHYLOCOCCUS SPECIES (COAGULASE NEGATIVE)     Note: THE SIGNIFICANCE OF ISOLATING THIS ORGANISM FROM A SINGLE SET OF BLOOD CULTURES WHEN MULTIPLE SETS ARE DRAWN IS UNCERTAIN. PLEASE NOTIFY THE  MICROBIOLOGY DEPARTMENT WITHIN ONE WEEK IF SPECIATION AND SENSITIVITIES ARE REQUIRED.     Note: Gram Stain Report Called to,Read Back By and Verified With: SHANNA STOWE ON 08/14/2014 AT 7:45P BY WILEJ     Performed at [facility]   Report Status 08/15/2014 FINAL   Final  MRSA PCR SCREENING     Status: Abnormal   Collection Time    08/13/14  8:52 PM      Result Value Ref Range Status   MRSA by PCR POSITIVE (*) NEGATIVE Final   Comment:            The GeneXpert MRSA Assay (FDA     approved for NASAL specimens     only), is one component of a     comprehensive MRSA colonization     surveillance program. It is not     intended to diagnose MRSA     infection nor to guide or     monitor treatment for     MRSA infections.     RESULT CALLED TO, READ BACK BY AND VERIFIED WITH:     Loleta Rose RN [id number] 08/13/14 A BROWNING  CULTURE, BLOOD (ROUTINE X 2)     Status: None   Collection Time    08/13/14 10:07 PM      Result Value Ref Range Status   Specimen Description BLOOD RIGHT HAND   Final   Special Requests BOTTLES DRAWN AEROBIC ONLY 5CC   Final   Culture  Setup Time     Final   Value: 08/14/2014 03:54     Performed at [facility]   Culture     Final   Value:        BLOOD CULTURE RECEIVED NO GROWTH TO DATE CULTURE WILL BE HELD FOR 5 DAYS BEFORE ISSUING A FINAL NEGATIVE REPORT     Performed at [facility]   Report Status PENDING   Incomplete  URINE CULTURE     Status: None   Collection Time    08/14/14  6:31 AM      Result Value Ref Range Status   Specimen Description URINE, CLEAN CATCH   Final   Special Requests NONE   Final   Culture  Setup Time     Final   Value: 08/14/2014 15:24     Performed at [facility] Count     Final   Value: NO GROWTH  Performed at [facility]     Final   Value: NO GROWTH     Performed at [facility]   Report Status 08/15/2014 FINAL   Final  CULTURE, BLOOD (ROUTINE X 2)     Status:  None   Collection Time    08/14/14  7:40 PM      Result Value Ref Range Status   Specimen Description BLOOD LEFT HAND   Final   Special Requests BOTTLES DRAWN AEROBIC ONLY 3CC   Final   Culture  Setup Time     Final   Value: 08/15/2014 03:29     Performed at [facility]   Culture     Final   Value:        BLOOD CULTURE RECEIVED NO GROWTH TO DATE CULTURE WILL BE HELD FOR 5 DAYS BEFORE ISSUING A FINAL NEGATIVE REPORT     Performed at [facility]   Report Status PENDING   Incomplete  CULTURE, BLOOD (ROUTINE X 2)     Status: None   Collection Time    08/14/14  7:50 PM      Result Value Ref Range Status   Specimen Description BLOOD LEFT ARM   Final   Special Requests BOTTLES DRAWN AEROBIC ONLY 3CC   Final   Culture  Setup Time     Final   Value: 08/15/2014 03:29     Performed at [facility]   Culture     Final   Value:        BLOOD CULTURE RECEIVED NO GROWTH TO DATE CULTURE WILL BE HELD FOR 5 DAYS BEFORE ISSUING A FINAL NEGATIVE REPORT     Performed at [facility]   Report Status PENDING   Incomplete     Labs: Basic Metabolic Panel:  Recent Labs Lab 08/13/14 1802 08/13/14 2207  08/14/14 1630 08/14/14 2230 08/15/14 0137 08/15/14 0516 08/16/14 0735  NA 152* 151*  < > 152* 151* 150*  151* 150* 143  K 4.5 3.7  < > 3.5* 3.2* 3.3*  3.3* 3.6* 3.5*  CL 109 115*  < > 115* 117* 117*  118* 117* 108  CO2 19 17*  < > 22 20 20  21 20 22   GLUCOSE 616* 514*  < > 147* 231* 195*  198* 180* 261*  BUN 87* 83*  < > 55* 40* 36*  36* 32* 19  CREATININE 2.68* 2.30*  < > 1.50* 1.22* 1.18*  1.19* 1.11* 0.96  CALCIUM 9.6 8.6  < > 9.0 8.4 8.5  8.5 8.4 8.2*  MG  --  2.6*  --   --   --  2.0  --   --   < > = values in this interval not displayed. Liver Function Tests:  Recent Labs Lab 08/13/14 1802 08/14/14 1630 08/15/14 0137 08/16/14 0735  AST 90* 95* 77* 63*  ALT 83* 73* 63* 66*  ALKPHOS 104 86 71 73  BILITOT 0.3 0.3 0.2* 0.5  PROT 8.3 7.2 6.2  6.4  ALBUMIN 2.7* 2.2* 1.9* 2.0*   No results found for this basename: LIPASE, AMYLASE,  in the last 168 hours  Recent Labs Lab 08/14/14 0056 08/16/14 0735  AMMONIA 89* 19   CBC:  Recent Labs Lab 08/13/14 1802 08/13/14 2207 08/14/14 0220 08/15/14 0137 08/16/14 0735  WBC 11.1* 10.5 11.7* 8.3 6.2  NEUTROABS 10.5*  --  10.9* 7.9*  --   HGB 10.5* 8.0* 9.1* 7.8* 9.0*  HCT 31.4* 23.4* 27.1* 22.9* 26.1*  MCV 88.5 85.1 87.1 86.4 84.7  PLT 310 281 283 256 257   Cardiac Enzymes:  Recent Labs Lab 08/13/14 2207 08/14/14 0220 08/14/14 0905 08/15/14 1055 08/16/14 0735  CKTOTAL 4435*  --   --  1937* 1245*  TROPONINI <0.30 <0.30 <0.30  --   --    BNP: BNP (last 3 results)  Recent Labs  08/13/14 1802  PROBNP 320.4*   CBG:  Recent Labs Lab 08/15/14 1138 08/15/14 1837 08/15/14 2129 08/16/14 0748 08/16/14 1154  GLUCAP 192* 302* 291* 255* 199*       Signed:  Seven Dollens A  [facility] 08/16/2014, 2:22 PM

## 2014-08-16 NOTE — PMR Pre-admission (Signed)
PMR Admission Coordinator Pre-Admission Assessment  Patient: Catherine [name] is an 68 y.o., female MRN: [id number] DOB: 09/25/1946 Height: 5\' 5"  (165.1 cm) Weight: 75 kg (165 lb 5.5 oz)              Insurance Information HMO:  No    PPO:       PCP:       IPA:       80/20:       OTHER:   PRIMARY: Medicare A/B      Policy#: [id number]      Subscriber: Burnard HawthorneBarbara Dawe CM Name:        Phone#:       Fax#:   Pre-Cert#:        Employer: Worked PT Benefits:  Phone #:       Name: Checked in [place] Date: 06/16/12     Deduct: $1260      Out of Pocket Max: none      Life Max: unlimited CIR: 100%      SNF: 100 days Outpatient: 80%     Co-Pay: 20% Home Health: 100%      Co-Pay: none DME: 80%     Co-Pay: 20% Providers: patient's choice  SECONDARY: ? BCBS Fed plan      Policy#:        Subscriber:   CM Name:        Phone#:       Fax#:   Pre-Cert#:        Employer:   Benefits:  Phone #:       Name:   Eff. Date:       Deduct:        Out of Pocket Max:        Life Max:   CIR:        SNF:   Outpatient:       Co-Pay:   Home Health:        Co-Pay:   DME:       Co-Pay:    Emergency Contact Information Contact Information   Name Relation Home Work Mobile   Baylor SurgicareMAYNARD,ISAAC  [phone]       Current Medical History  Patient Admitting Diagnosis:  Deconditioned after PNA and DKA  History of Present Illness: A 68 y.o. right-handed female with history of diabetes mellitus as well as hypertension. Admitted 08/13/2014 with increasing weakness over the past week as well as nonproductive cough. Denied any chills or fever. Husband had noted some slurred speech over 2 days as well as increasing lethargy. Chest x-ray showed bilateral pneumonia. Elevated blood sugars in the ER 568. Placed on broad-spectrum antibiotics. Strep pneumo urine screen negative. MRI of the brain completed due to confusion showing no acute abnormalities. Subcutaneous Lovenox for DVT prophylaxis. Hemoglobin A1c 7.5 with insulin therapy  as directed. Occupational therapy evaluation completed noting confusion/ debilitation with request for physical medicine rehabilitation consult.    Past Medical History  Past Medical History  Diagnosis Date  . Diabetes mellitus without complication   . Hypertension   . Thyroid disease     Family History  family history includes Diabetes Mellitus II in her mother and sister; Hypertension in her sister; Stroke in her mother.  Prior Rehab/Hospitalizations:  Had outpatient therapy after gallbladder surgery years agol   Current Medications  Current facility-administered medications:0.45 % sodium chloride infusion, , Intravenous, Continuous, Clydia LlanoMutaz Elmahi, MD, Last Rate: 75 mL/hr at 08/16/14 0743;  aspirin chewable tablet 81 mg, 81 mg, Oral,  Daily, Lonia Blood, MD, 81 mg at 08/16/14 1007;  beta carotene w/minerals (OCUVITE) tablet 1 tablet, 1 tablet, Oral, Daily, Lonia Blood, MD, 1 tablet at 08/16/14 1007 Chlorhexidine Gluconate Cloth 2 % PADS 6 each, 6 each, Topical, Q0600, Eduard Clos, MD, 6 each at 08/15/14 0600;  dextromethorphan-guaiFENesin (MUCINEX DM) 30-600 MG per 12 hr tablet 1 tablet, 1 tablet, Oral, BID, Drema Dallas, MD, 1 tablet at 08/16/14 1007;  dextrose 50 % solution 25 mL, 25 mL, Intravenous, PRN, Eduard Clos, MD enoxaparin (LOVENOX) injection 40 mg, 40 mg, Subcutaneous, Q24H, Lonia Blood, MD, 40 mg at 08/15/14 2159;  free water 300 mL, 300 mL, Oral, Q6H WA, Clydia Llano, MD, 300 mL at 08/16/14 0810;  insulin aspart (novoLOG) injection 0-15 Units, 0-15 Units, Subcutaneous, TID WC, Lonia Blood, MD, 3 Units at 08/16/14 1234;  insulin aspart (novoLOG) injection 0-5 Units, 0-5 Units, Subcutaneous, QHS, Lonia Blood, MD, 3 Units at 08/15/14 2159 insulin glargine (LANTUS) injection 10 Units, 10 Units, Subcutaneous, QHS, Lonia Blood, MD;  ipratropium-albuterol (DUONEB) 0.5-2.5 (3) MG/3ML nebulizer solution 3 mL, 3 mL, Nebulization, Q2H PRN,  Lonia Blood, MD;  levofloxacin [facility]) tablet 750 mg, 750 mg, Oral, QPM, [address] [place], RPH;  levothyroxine (SYNTHROID, LEVOTHROID) tablet 50 mcg, 50 mcg, Oral, QAC breakfast, Eduard Clos, MD, 50 mcg at 08/16/14 0757 mupirocin ointment (BACTROBAN) 2 % 1 application, 1 application, Nasal, BID, Eduard Clos, MD, 1 application at 08/16/14 1020  Patients Current Diet: Carb Control  Precautions / Restrictions Precautions Precautions: Fall Precaution Comments: knees buckle Restrictions Weight Bearing Restrictions: No   Prior Activity Level Community (5-7x/wk): Worked PT 5 days a week.  Went out daily.  Home Assistive Devices / Equipment Home Assistive Devices/Equipment: CBG Meter Home Equipment: None  Prior Functional Level Prior Function Level of Independence: Independent Comments: pt reports she drives and does IADL  Current Functional Level Cognition  Arousal/Alertness: Awake/alert Overall Cognitive Status: Difficult to assess Difficult to assess due to: Level of arousal Orientation Level: Oriented X4 Attention: Sustained Sustained Attention: Impaired Sustained Attention Impairment: Verbal basic;Functional basic Memory: Impaired Memory Impairment: Decreased recall of new information Awareness: Impaired Awareness Impairment: Intellectual impairment Problem Solving: Impaired Problem Solving Impairment: Functional basic Safety/Judgment: Impaired    Extremity Assessment (includes Sensation/Coordination)  Upper Extremity Assessment: Defer to OT evaluation  Lower Extremity Assessment: Generalized weakness  Cervical / Trunk Assessment: Normal    ADLs  Overall ADL's : Needs assistance/impaired Eating/Feeding: NPO Grooming: Wash/dry face;Set up;Sitting Upper Body Dressing : Minimal assistance;Sitting (supported sitting) Lower Body Dressing: Sit to/from stand;Moderate assistance Lower Body Dressing Details (indicate cue type and reason): pt  with minimal attempt to donn socks, appearing fatigued Toilet Transfer: Minimal assistance;Stand-pivot;BSC Toileting- Clothing Manipulation and Hygiene: Sitting/lateral lean;Minimal assistance General ADL Comments: Pt with generalized weakness and lethargy.    Mobility  Overal bed mobility: Needs Assistance Bed Mobility: Supine to Sit;Sit to Supine Supine to sit: Mod assist Sit to supine: Mod assist General bed mobility comments: moves slowly due to lethargy, assist for LEs and to guide trunk    Transfers  Overall transfer level: Needs assistance Equipment used: 1 person hand held assist Transfers: Sit to/from Stand Sit to Stand: Min assist General transfer comment: assist to steady, pt able to power up, extra time    Ambulation / Gait / Stairs / Wheelchair Mobility  Ambulation/Gait General Gait Details: Deferred due to pt fatigue and unsteadiness.    Posture / Balance  Dynamic Sitting Balance Sitting balance - Comments: pt keeping her eyes closed and wavering in sitting, did not attempt to steady herself with her UEs    Special needs/care consideration BiPAP/CPAP No CPM No Continuous Drip IV 0.45% NS 75 ml/hr Dialysis No        Life Vest No Oxygen No Special Bed No Trach Size No Wound Vac (area) No      Skin Has "inflamed hemorrhoids" per patient                             Bowel mgmt: Last BM 08/16/14 Bladder mgmt: Voiding in the bathroom with assistance Diabetic mgmt Yes, on oral medications. Orange contact isolation    Previous Home Environment Living Arrangements: Spouse/significant other Available Help at Discharge: Family;Available 24 hours/day Type of Home: House Home Layout: One level Home Access: Stairs to enter [employer] of Steps: 1 Bathroom Shower/Tub: Engineer, manufacturing systems: Standard Home Care Services: No Additional Comments: husband answering questions for pt, although pt capable  Discharge Living Setting Plans for Discharge  Living Setting: Patient's home;House;Lives with (comment) (Lives with husband and 78 yo son.) Type of Home at Discharge: House Discharge Home Layout: One level Discharge Home Access: Stairs to enter Entrance Stairs-Number of Steps: 1 step down and then 1 more step up at back entrance Does the patient have any problems obtaining your medications?: No  Social/Family/Support Systems Patient Roles: Spouse;Parent (Has a husband and a 31 yo son.) Contact Information: Arelia Longest, Sr - spouse Anticipated Caregiver: husband Anticipated Caregiver's Contact Information: Marcello Moores - husband [phone] Ability/Limitations of Caregiver: Husband is retired and can assist.  Son in home and works flexible hours Caregiver Availability: 24/7 Discharge Plan Discussed with Primary Caregiver: Yes Is Caregiver In Agreement with Plan?: Yes Does Caregiver/Family have Issues with Lodging/Transportation while Pt is in Rehab?: No  Goals/Additional Needs Patient/Family Goal for Rehab: PT mod I, OT mod I and supervision goals Expected length of stay: 10-14 days Cultural Considerations: [employer] Dietary Needs: Carb mod med cal, thin liquids Equipment Needs: TBD Pt/Family Agrees to Admission and willing to participate: Yes Program Orientation Provided & Reviewed with Pt/Caregiver Including Roles  & Responsibilities: Yes  Decrease burden of Care through IP rehab admission: N/A  Possible need for SNF placement upon discharge: Not planned  Patient Condition: This patient's condition remains as documented in the consult dated 08/15/14, in which the Rehabilitation Physician determined and documented that the patient's condition is appropriate for intensive rehabilitative care in an inpatient rehabilitation facility. Will admit to inpatient rehab today.  Preadmission Screen Completed By:  Trish Mage, 08/16/2014 2:02 PM ______________________________________________________________________   Discussed status with  Dr. Riley Kill on 08/16/14 at 1402 and received telephone approval for admission today.  Admission Coordinator:  Trish Mage, time1402/Date10/01/15

## 2014-08-16 NOTE — Progress Notes (Signed)
Results for Governor SpeckingMAYNARD, Paitynn P (MRN [id number]) as of 08/16/2014 11:04  Ref. Range 08/15/2014 09:43 08/15/2014 11:38 08/15/2014 18:37 08/15/2014 21:29 08/16/2014 07:48  Glucose-Capillary Latest Range: 70-99 mg/dL 409160 (H) 811192 (H) 914302 (H) 291 (H) 255 (H)   CBGs greater than 180 mg/dl.  Recommend increasing Lantus to 15-20 units daily, continuing Novolog correction scale, and adding Novolog 3 units TID as meal coverage if patient eats at least 50% of meals if CBGs continue to be greater tha 180 mg/dl.  Will follow while in hospital.  Smith MinceKendra Juvon Teater RN BSN CDE

## 2014-08-17 ENCOUNTER — Inpatient Hospital Stay (HOSPITAL_COMMUNITY): Payer: Medicare Other

## 2014-08-17 ENCOUNTER — Inpatient Hospital Stay (HOSPITAL_COMMUNITY): Payer: Medicare Other | Admitting: Physical Therapy

## 2014-08-17 DIAGNOSIS — D62 Acute posthemorrhagic anemia: Secondary | ICD-10-CM

## 2014-08-17 DIAGNOSIS — E119 Type 2 diabetes mellitus without complications: Secondary | ICD-10-CM

## 2014-08-17 DIAGNOSIS — E1310 Other specified diabetes mellitus with ketoacidosis without coma: Secondary | ICD-10-CM

## 2014-08-17 DIAGNOSIS — J189 Pneumonia, unspecified organism: Secondary | ICD-10-CM

## 2014-08-17 DIAGNOSIS — R5381 Other malaise: Principal | ICD-10-CM

## 2014-08-17 LAB — CBC WITH DIFFERENTIAL/PLATELET
BASOS PCT: 0 % (ref 0–1)
Basophils Absolute: 0 10*3/uL (ref 0.0–0.1)
Eosinophils Absolute: 0.1 10*3/uL (ref 0.0–0.7)
Eosinophils Relative: 1 % (ref 0–5)
HEMATOCRIT: 25.4 % — AB (ref 36.0–46.0)
Hemoglobin: 8.7 g/dL — ABNORMAL LOW (ref 12.0–15.0)
LYMPHS ABS: 0.5 10*3/uL — AB (ref 0.7–4.0)
Lymphocytes Relative: 8 % — ABNORMAL LOW (ref 12–46)
MCH: 29.5 pg (ref 26.0–34.0)
MCHC: 34.3 g/dL (ref 30.0–36.0)
MCV: 86.1 fL (ref 78.0–100.0)
MONO ABS: 0.2 10*3/uL (ref 0.1–1.0)
Monocytes Relative: 3 % (ref 3–12)
Neutro Abs: 5 10*3/uL (ref 1.7–7.7)
Neutrophils Relative %: 88 % — ABNORMAL HIGH (ref 43–77)
Platelets: 277 10*3/uL (ref 150–400)
RBC: 2.95 MIL/uL — AB (ref 3.87–5.11)
RDW: 13.9 % (ref 11.5–15.5)
WBC: 5.7 10*3/uL (ref 4.0–10.5)

## 2014-08-17 LAB — COMPREHENSIVE METABOLIC PANEL
ALBUMIN: 2 g/dL — AB (ref 3.5–5.2)
ALK PHOS: 70 U/L (ref 39–117)
ALT: 52 U/L — ABNORMAL HIGH (ref 0–35)
ANION GAP: 12 (ref 5–15)
AST: 40 U/L — ABNORMAL HIGH (ref 0–37)
BUN: 15 mg/dL (ref 6–23)
CHLORIDE: 106 meq/L (ref 96–112)
CO2: 22 mEq/L (ref 19–32)
CREATININE: 0.97 mg/dL (ref 0.50–1.10)
Calcium: 8.6 mg/dL (ref 8.4–10.5)
GFR calc Af Amer: 69 mL/min — ABNORMAL LOW (ref >90)
GFR, EST NON AFRICAN AMERICAN: 59 mL/min — AB (ref >90)
GLUCOSE: 238 mg/dL — AB (ref 70–99)
Potassium: 3.4 mEq/L — ABNORMAL LOW (ref 3.7–5.3)
Sodium: 140 mEq/L (ref 137–147)
Total Bilirubin: 0.4 mg/dL (ref 0.3–1.2)
Total Protein: 6.2 g/dL (ref 6.0–8.3)

## 2014-08-17 LAB — GLUCOSE, CAPILLARY
GLUCOSE-CAPILLARY: 194 mg/dL — AB (ref 70–99)
Glucose-Capillary: 258 mg/dL — ABNORMAL HIGH (ref 70–99)
Glucose-Capillary: 199 mg/dL — ABNORMAL HIGH (ref 70–99)
Glucose-Capillary: 196 mg/dL — ABNORMAL HIGH (ref 70–99)

## 2014-08-17 MED ORDER — FERROUS SULFATE 325 (65 FE) MG PO TABS
325.0000 mg | ORAL_TABLET | Freq: Two times a day (BID) | ORAL | Status: DC
  Administered 2014-08-17 – 2014-08-23 (×12): 325 mg via ORAL
  Filled 2014-08-17 (×17): qty 1

## 2014-08-17 MED ORDER — GLIMEPIRIDE 2 MG PO TABS
2.0000 mg | ORAL_TABLET | Freq: Every day | ORAL | Status: DC
  Administered 2014-08-17 – 2014-08-19 (×3): 2 mg via ORAL
  Filled 2014-08-17 (×6): qty 1

## 2014-08-17 NOTE — Progress Notes (Addendum)
Admission note: Receive report on patient from 1st shift nurse,Patient in bed resting, vitals within normal limits, call bell at bedside, not complaints of pain

## 2014-08-17 NOTE — Evaluation (Signed)
Physical Therapy Assessment and Plan  Patient Details  Name: Catherine [name] MRN: [id number] Date of Birth: [birth year]  PT Diagnosis: Abnormality of gait, Edema and Impaired cognition Rehab Potential: Good ELOS: 7-10 days   Today's Date: 08/17/2014 PT Individual Time: 8341-9622 PT Individual Time Calculation (min): 60 min    Problem List:  Patient Active Problem List   Diagnosis Date Noted  . Physical debility 08/16/2014  . Pneumonia 08/13/2014  . DKA, type 2 08/13/2014  . Acute renal failure 08/13/2014  . Hypernatremia 08/13/2014  . Elevated LFTs 08/13/2014  . Unspecified hypothyroidism 08/13/2014  . DKA (diabetic ketoacidoses) 08/13/2014    Past Medical History:  Past Medical History  Diagnosis Date  . Diabetes mellitus without complication   . Hypertension   . Thyroid disease    Past Surgical History:  Past Surgical History  Procedure Laterality Date  . Cholecystectomy      Assessment & Plan Clinical Impression: Ms. Catherine [name] is a 68 year old female with history of DM type 2, HTN, hypothyroid who was admitted on 08/13/14 with one week history of weakness with difficulty walking as well as 2 day history of productive cough with lethargy and slurred speech. She was found to have bilateral CAP with acute renal failure with BUN/Cr- 83/2.0, BS - 514 and CK 4435. She was started on IV antibiotics, gentle hydration as well as as IV insulin. Lisinopril and Diuretics d/c. UDS negative. Lactate normal. Patient with abnormal LFTs and Ammonia levels elevated at 89. TSH-1.090. MRI brain without acute abnormality and mild chronic small vessel disease. Lethargy has resolved but patient continue to have delayed processing with difficulty following simple commands. Swallow evaluation with mild oral holding and patient placed on dysphagia 3, thin liquids. She continues ot have cough and follow up CXR revealed worsening aeration RUL with multifocal PNA.Patient transferred to CIR on  08/16/2014 .   Patient currently requires min with mobility secondary to impaired timing and sequencing, unbalanced muscle activation, decreased coordination and decreased motor planning, decreased attention, decreased awareness, decreased problem solving and decreased memory and decreased standing balance, decreased postural control and decreased balance strategies.  Prior to hospitalization, patient was independent  with mobility and lived with Spouse in a House home.  Home access is one step then landing, then another stepStairs to enter.  Patient will benefit from skilled PT intervention to maximize safe functional mobility and minimize fall risk for planned discharge home with intermittent supervision.  Anticipate patient will benefit from follow up Solomon at discharge.  PT - End of Session Activity Tolerance: Tolerates 30+ min activity with multiple rests Endurance Deficit: Yes PT Assessment Rehab Potential: Good Barriers to Discharge: Other (comment) (none) PT Patient demonstrates impairments in the following area(s): Balance;Edema;Endurance;Pain;Safety;Sensory PT Transfers Functional Problem(s): Bed Mobility;Bed to Chair;Car;Furniture;Floor PT Locomotion Functional Problem(s): Ambulation;Stairs;Wheelchair Mobility PT Plan PT Intensity: Minimum of 1-2 x/day ,45 to 90 minutes PT Frequency: 5 out of 7 days PT Duration Estimated Length of Stay: 7-10 days PT Treatment/Interventions: Ambulation/gait training;Balance/vestibular training;Cognitive remediation/compensation;Discharge planning;Functional mobility training;DME/adaptive equipment instruction;Neuromuscular re-education;Patient/family education;Stair training;Therapeutic Activities;Therapeutic Exercise; UE/LE Strength taining/ROM;UE/LE Coordination activities;Wheelchair propulsion/positioning PT Transfers Anticipated Outcome(s): Supervision PT Locomotion Anticipated Outcome(s): Supervision PT Recommendation Recommendations for Other  Services: Speech consult Follow Up Recommendations: 24 hour supervision/assistance;Home health PT Patient destination: Home Equipment Recommended: To be determined  Skilled Therapeutic Intervention PT evaluation completed; see below for detailed findings. Treatment initiated; session focused on functional standing balance. Completed Berg Balance Scale with score of 32/56, suggesting significant fall  risk. Educated pt on evaluation findings, goals, and plan of care. Pt verbalized understanding and was in full agreement with plan of care. Session ended in pt room, where pt was left seated in w/c with all needs within reach.  PT Evaluation Precautions/Restrictions Precautions Precautions: Fall Restrictions Weight Bearing Restrictions: No General   Vital SignsTherapy Vitals Temp: 97.8 F (36.6 C) Temp Source: Oral Pulse Rate: 75 Resp: 20 BP: 121/57 mmHg Patient Position (if appropriate): Lying Oxygen Therapy SpO2: 99 % O2 Device: None (Room air) Pain  Denies pain. Home Living/Prior Functioning Home Living Available Help at Discharge: Family;Available 24 hours/day Type of Home: House Home Access: Stairs to enter [employer] of Steps: one step then landing, then another step Entrance Stairs-Rails: None Home Layout: One level  Lives With: Spouse Prior Function Level of Independence: Independent with basic ADLs;Independent with homemaking with ambulation;Independent with transfers  Able to Take Stairs?: Yes Driving: Yes Vocation: Full time employment Vocation Requirements: building maintenance  Cognition Overall Cognitive Status: Within Functional Limits for tasks assessed Arousal/Alertness: Awake/alert Orientation Level: Oriented X4 Attention: Selective Sustained Attention: Appears intact Memory: Impaired Memory Impairment: Decreased recall of new information Awareness: Appears intact Problem Solving: Impaired Problem Solving Impairment: Functional  basic Safety/Judgment: Impaired Sensation Sensation Light Touch: Appears Intact Hot/Cold: Appears Intact Proprioception: Impaired by gross assessment (bilat LE's) Coordination Gross Motor Movements are Fluid and Coordinated: Yes Fine Motor Movements are Fluid and Coordinated: Yes Heel Shin Test: Smoothness of movement limited on LLE as compared with RLE. Motor  Motor Motor: Within Functional Limits  Mobility Bed Mobility Bed Mobility: Supine to Sit;Sit to Supine Supine to Sit: 5: Supervision;HOB flat Sit to Supine: HOB flat;5: Supervision Transfers Transfers: Yes Sit to Stand: 4: Min assist Sit to Stand Details: Verbal cues for technique Stand to Sit: 4: Min assist Stand to Sit Details (indicate cue type and reason): Verbal cues for technique;Verbal cues for precautions/safety Stand Pivot Transfers: 4: Min assist;Other (comment) (HHA) Stand Pivot Transfer Details: Verbal cues for precautions/safety Locomotion  Ambulation Ambulation: Yes Ambulation/Gait Assistance: 4: Min assist Ambulation Distance (Feet): 80 Feet Assistive device: 1 person hand held assist Ambulation/Gait Assistance Details: Verbal cues for technique Ambulation/Gait Assistance Details: Pt ambulated x80', x130' in controlled environment with min A, R HHA. Gait Gait: Yes Gait Pattern: Impaired Gait Pattern: Step-through pattern;Wide base of support;Decreased trunk rotation (minimal arm swing bilaterally) Stairs / Additional Locomotion Stairs: Yes Stairs Assistance: 5: Supervision Stairs Assistance Details: Verbal cues for technique;Verbal cues for sequencing Stair Management Technique: Two rails;Step to pattern;Forwards Number of Stairs: 5 Architect: Yes Environmental health practitioner: Both upper extremities Wheelchair Parts Management: Needs assistance Distance: 75  Trunk/Postural Assessment  Cervical Assessment Cervical Assessment: Within Functional Limits Thoracic  Assessment Thoracic Assessment: Within Functional Limits Lumbar Assessment Lumbar Assessment: Within Functional Limits Cervical Assessment Cervical Assessment: Within Functional Limits Thoracic Assessment Thoracic Assessment: Within Functional Limits Lumbar Assessment Lumbar Assessment: Within Functional Limits Postural Control Postural Control: Deficits on evaluation Righting Reactions: Ankle and stepping strategy present. No hip strategy present with posterior LOB. Balance Balance Balance Assessed: Yes Static Standing Balance Static Standing - Balance Support: Right upper extremity supported (min assist) Dynamic Standing Balance Dynamic Standing - Balance Support: Right upper extremity supported (min assist) Balance Balance Assessed: Yes Standardized Balance Assessment Standardized Balance Assessment: Berg Balance Test Berg Balance Test Sit to Stand: Able to stand without using hands and stabilize independently Standing Unsupported: Able to stand safely 2 minutes Sitting with Back Unsupported but Feet Supported on  Floor or Stool: Able to sit safely and securely 2 minutes Stand to Sit: Sits safely with minimal use of hands Transfers: Able to transfer with verbal cueing and /or supervision Standing Unsupported with Eyes Closed: Able to stand 10 seconds with supervision Standing Ubsupported with Feet Together: Needs help to attain position but able to stand for 30 seconds with feet together From Standing, Reach Forward with Outstretched Arm: Can reach forward >12 cm safely (5") From Standing Position, Pick up Object from Floor: Unable to try/needs assist to keep balance From Standing Position, Turn to Look Behind Over each Shoulder: Turn sideways only but maintains balance Turn 360 Degrees: Needs close supervision or verbal cueing Standing Unsupported, Alternately Place Feet on Step/Stool: Able to complete >2 steps/needs minimal assist Standing Unsupported, One Foot in Front: Able  to take small step independently and hold 30 seconds Standing on One Leg: Tries to lift leg/unable to hold 3 seconds but remains standing independently Total Score: 32 Extremity Assessment  RUE Assessment RUE Assessment: Within Functional Limits (grossly 4/5 strength) LUE Assessment LUE Assessment: Within Functional Limits (grossly 4/5 strength) RLE Assessment RLE Assessment: Exceptions to [facility] RLE Strength RLE Overall Strength: Deficits RLE Overall Strength Comments: Grossly 4-/5 to 4/5 LLE Assessment LLE Assessment: Exceptions to [facility] LLE Strength LLE Overall Strength: Deficits LLE Overall Strength Comments: Grossly 4-/5 to 4/5  FIM:  FIM - Control and instrumentation engineer Devices:  (HHA) Bed/Chair Transfer: 5: Supine > Sit: Supervision (verbal cues/safety issues);5: Sit > Supine: Supervision (verbal cues/safety issues);4: Bed > Chair or W/C: Min A (steadying Pt. > 75%);4: Chair or W/C > Bed: Min A (steadying Pt. > 75%) FIM - Locomotion: Wheelchair Distance: 75 Locomotion: Wheelchair: 2: Travels 50 - 149 ft with supervision, cueing or coaxing FIM - Locomotion: Ambulation Locomotion: Ambulation Assistive Devices: Other (comment) (HHA) Ambulation/Gait Assistance: 4: Min assist Locomotion: Ambulation: 2: Travels 50 - 149 ft with minimal assistance (Pt.>75%) FIM - Locomotion: Stairs Locomotion: Scientist, physiological: Hand rail - 2 Locomotion: Stairs: 2: Up and Down 4 - 11 stairs with supervision/safety concerns/verbal cues   Refer to Care Plan for Long Term Goals  Recommendations for other services: Other: speech therapy evaluation  Discharge Criteria: Patient will be discharged from PT if patient refuses treatment 3 consecutive times without medical reason, if treatment goals not met, if there is a change in medical status, if patient makes no progress towards goals or if patient is discharged from hospital.  The above assessment, treatment plan, treatment  alternatives and goals were discussed and mutually agreed upon: by patient  Stefano Gaul 08/17/2014, 5:40 PM

## 2014-08-17 NOTE — Progress Notes (Signed)
Patient information reviewed and entered into eRehab system by Alorah Mcree, RN, CRRN, PPS Coordinator.  Information including medical coding and functional independence measure will be reviewed and updated through discharge.     Per nursing patient was given "Data Collection Information Summary for Patients in Inpatient Rehabilitation Facilities with attached "Privacy Act Statement-Health Care Records" upon admission.  

## 2014-08-17 NOTE — Evaluation (Signed)
Occupational Therapy Assessment and Plan and Treatment Notes  Patient Details  Name: Catherine [name] MRN: [id number] Date of Birth: 02-04-1946  OT Diagnosis: altered mental status, cognitive deficits and muscle weakness (generalized) Rehab Potential: Rehab Potential: Excellent ELOS: 7-10 days   Today's Date: 08/17/2014 OT Individual Time: 5188-4166 and 1100-1200 OT Individual Time Calculation (min): 60 min and 60 min      Problem List:  Patient Active Problem List   Diagnosis Date Noted  . Physical debility 08/16/2014  . Pneumonia 08/13/2014  . DKA, type 2 08/13/2014  . Acute renal failure 08/13/2014  . Hypernatremia 08/13/2014  . Elevated LFTs 08/13/2014  . Unspecified hypothyroidism 08/13/2014  . DKA (diabetic ketoacidoses) 08/13/2014    Past Medical History:  Past Medical History  Diagnosis Date  . Diabetes mellitus without complication   . Hypertension   . Thyroid disease    Past Surgical History:  Past Surgical History  Procedure Laterality Date  . Cholecystectomy      Assessment & Plan Clinical Impression: Catherine [name] is a 68 year old female with history of DM type 2, HTN, hypothyroid who was admitted on 08/13/14 with one week history of weakness with difficulty walking as well as 2 day history of productive cough with lethargy and slurred speech. She was found to have bilateral CAP with acute renal failure with BUN/Cr- 83/2.0, BS - 514 and CK 4435. She was started on IV antibiotics, gentle hydration as well as as IV insulin. Lisinopril and Diuretics d/c. UDS negative. Lactate normal. Patient with abnormal LFTs and Ammonia levels elevated at 89. TSH-1.090. MRI brain without acute abnormality and mild chronic small vessel disease. Lethargy has resolved but patient continue to have delayed processing with difficulty following simple commands. Swallow evaluation with mild oral holding and patient placed on dysphagia 3, thin liquids. She continues ot have cough and  follow up CXR revealed worsening aeration RUL with multifocal PNA. The patient was screened by the inpatient rehab team, and ultimately she was admitted to CIR. Patient transferred to CIR on 08/16/2014 .    Patient currently requires min-supervision with basic self-care skills and functional transfers secondary to muscle weakness, decreased cardiorespiratoy endurance and decreased standing balance, decreased postural control and decreased balance strategies.  Prior to hospitalization, patient could complete BADLs and IADLs with independent .  Patient will benefit from skilled intervention to increase independence with basic self-care skills prior to discharge home with care partner.  Anticipate patient will require intermittent supervision for ADLs and follow-up OT to be determined.  OT - End of Session Activity Tolerance: Decreased this session Endurance Deficit: Yes Endurance Deficit Description: rest breaks OT Assessment Rehab Potential: Excellent OT Patient demonstrates impairments in the following area(s): Balance;Cognition;Safety;Motor;Endurance OT Basic ADL's Functional Problem(s): Grooming;Bathing;Dressing;Toileting OT Advanced ADL's Functional Problem(s): Simple Meal Preparation OT Transfers Functional Problem(s): Toilet;Tub/Shower OT Additional Impairment(s): None OT Plan OT Treatment/Interventions: Cognitive remediation/compensation;Balance/vestibular training;Community reintegration;Discharge planning;DME/adaptive equipment instruction;Functional mobility training;Psychosocial support;Patient/family education;Self Care/advanced ADL retraining;Therapeutic Activities;Therapeutic Exercise;UE/LE Strength taining/ROM;UE/LE Coordination activities OT Self Feeding Anticipated Outcome(s): n/a OT Basic Self-Care Anticipated Outcome(s): Mod I  OT Toileting Anticipated Outcome(s): Mod I  OT Bathroom Transfers Anticipated Outcome(s): Mod I OT Recommendation Patient destination: Home Follow Up  Recommendations: None Equipment Recommended: To be determined   Skilled Therapeutic Intervention Session 1: OT eval completed. Discussed role of OT, goals of therapy, ELOS, follow-up, and safety plan. Pt completed ADLs this AM ambulating to/from bathroom with min HHA. Pt required cues during functional mobility for safety as she  was furniture walking and attempting to hold onto moveable objects. Pt completed sit<>stand during self-care tasks with min-SBA for balance. Pt following simple commands throughout session, with slight delayed response at times and mod cues for problem solving. Pt required rest breaks during session d/t fatigue. Pt left supine in bed with all needs in reach.   Session 2: Pt seen for 1:1 OT session with focus on functional transfers, dynamic standing balance, activity tolerance, and problem solving. Pt received supine in bed. Ambulated to bathroom with CGA and completed toilet task at supervision level. Completed tub transfer with TTB (pt reporting having one at home) with min cues and CGA assist. Engaged in plant watering task with pt ambulating to family room to fill bucket then water multiple plants while reaching out of BOS with CGA-SBA for balance. Pt required mod verbal and demonstration cues for problem solving with managing water bucket. Engaged in dynamic standing balance task of playing Wii bowling with SBA-CGA for sit<>stand standing balance. Pt required cues for basic problem solving during game to position ball correctly as she positioned self at gutter 2x. Engaged in horseshoe task with pt reaching out of BOS to retrieve and toss horseshoes at [facility] for balance. Pt fatiguing quickly throughout session, requiring multiple rest breaks. Pt left sitting in w/c with all needs in reach.   OT Evaluation Precautions/Restrictions  Precautions Precautions: Fall Restrictions Weight Bearing Restrictions: No General   Vital Signs Therapy Vitals Temp: 98.3 F (36.8 C) Temp  Source: Oral Pulse Rate: 68 Resp: 18 BP: 132/49 mmHg Patient Position (if appropriate): Lying Oxygen Therapy SpO2: 94 % O2 Device: None (Room air) Pain Pain Assessment Pain Assessment: No/denies pain Home Living/Prior Functioning Home Living Family/patient expects to be discharged to:: Private residence Living Arrangements: Spouse/significant other Available Help at Discharge: Family;Available 24 hours/day Type of Home: House Home Access: Stairs to enter [employer] of Steps: 1 Home Layout: One level  Lives With: Spouse IADL History Homemaking Responsibilities: Yes Homemaking Comments: husband plans to assist with IADLs Current License: Yes Prior Function Level of Independence: Independent with basic ADLs;Independent with homemaking with ambulation;Independent with transfers Driving: Yes ADL   Vision/Perception  Vision- History Baseline Vision/History: Wears glasses Wears Glasses: Reading only;Distance only (reading and driving) Patient Visual Report: No change from baseline Vision- Assessment Vision Assessment?: No apparent visual deficits  Cognition Overall Cognitive Status: Within Functional Limits for tasks assessed Arousal/Alertness: Awake/alert Orientation Level: Oriented X4 Attention: Selective Sustained Attention: Appears intact Memory: Impaired Memory Impairment: Decreased recall of new information Awareness: Appears intact Problem Solving: Impaired Problem Solving Impairment: Functional basic Safety/Judgment: Impaired Sensation Sensation Light Touch: Appears Intact Hot/Cold: Appears Intact Proprioception: Appears Intact Coordination Gross Motor Movements are Fluid and Coordinated: Yes Fine Motor Movements are Fluid and Coordinated: Yes Motor  Motor Motor: Within Functional Limits Mobility  Transfers Transfers: Sit to Stand;Stand to Sit Sit to Stand: 4: Min assist Sit to Stand Details: Verbal cues for technique Stand to Sit: 4: Min  assist Stand to Sit Details (indicate cue type and reason): Verbal cues for technique  Trunk/Postural Assessment  Cervical Assessment Cervical Assessment: Within Functional Limits Thoracic Assessment Thoracic Assessment: Within Functional Limits Lumbar Assessment Lumbar Assessment: Within Functional Limits Postural Control Postural Control: Within Functional Limits  Balance Balance Balance Assessed: Yes Static Standing Balance Static Standing - Balance Support: Right upper extremity supported (min assist) Dynamic Standing Balance Dynamic Standing - Balance Support: Right upper extremity supported (min assist) Extremity/Trunk Assessment RUE Assessment RUE Assessment: Within Functional Limits (  grossly 4/5 strength) LUE Assessment LUE Assessment: Within Functional Limits (grossly 4/5 strength)  FIM:  FIM - Grooming Grooming Steps: Wash, rinse, dry face;Wash, rinse, dry hands;Oral care, brush teeth, clean dentures Grooming: 5: Set-up assist to obtain items FIM - Bathing Bathing Steps Patient Completed: Chest;Right Arm;Left Arm;Abdomen;Left upper leg;Right upper leg;Buttocks;Front perineal area;Right lower leg (including foot);Left lower leg (including foot) Bathing: 5: Supervision: Safety issues/verbal cues FIM - Upper Body Dressing/Undressing Upper body dressing/undressing steps patient completed: Thread/unthread right sleeve of pullover shirt/dresss;Thread/unthread left sleeve of pullover shirt/dress;Put head through opening of pull over shirt/dress Upper body dressing/undressing: 4: Min-Patient completed 75 plus % of tasks FIM - Lower Body Dressing/Undressing Lower body dressing/undressing steps patient completed: Thread/unthread right underwear leg;Thread/unthread left underwear leg;Pull underwear up/down;Thread/unthread right pants leg;Thread/unthread left pants leg;Pull pants up/down;Don/Doff right sock;Don/Doff left sock Lower body dressing/undressing: 4: Steadying Assist FIM  - Toileting Toileting steps completed by patient: Adjust clothing prior to toileting;Performs perineal hygiene;Adjust clothing after toileting Toileting Assistive Devices: Grab bar or rail for support Toileting: 4: Steadying assist FIM - IT sales professional Transfer: 5: Supine > Sit: Supervision (verbal cues/safety issues);5: Sit > Supine: Supervision (verbal cues/safety issues);4: Bed > Chair or W/C: Min A (steadying Pt. > 75%);4: Chair or W/C > Bed: Min A (steadying Pt. > 75%) FIM - Toilet Transfers Toilet Transfers: 4-To toilet/BSC: Min A (steadying Pt. > 75%);4-From toilet/BSC: Min A (steadying Pt. > 75%) FIM - Tub/Shower Transfers Tub/Shower Assistive Devices: Walk in shower;Tub transfer bench;Grab bars Tub/shower Transfers: 4-Into Tub/Shower: Min A (steadying Pt. > 75%/lift 1 leg);4-Out of Tub/Shower: Min A (steadying Pt. > 75%/lift 1 leg)   Refer to Care Plan for Long Term Goals  Recommendations for other services: None  Discharge Criteria: Patient will be discharged from OT if patient refuses treatment 3 consecutive times without medical reason, if treatment goals not met, if there is a change in medical status, if patient makes no progress towards goals or if patient is discharged from hospital.  The above assessment, treatment plan, treatment alternatives and goals were discussed and mutually agreed upon: by patient  Duayne Cal 08/17/2014, 9:49 AM

## 2014-08-17 NOTE — Progress Notes (Signed)
[facility] PHYSICAL MEDICINE & REHABILITATION     PROGRESS NOTE    Subjective/Complaints: No specific new issues. Remains a little irritable. Slept ok  Objective: Vital Signs: Blood pressure 132/49, pulse 68, temperature 98.3 F (36.8 C), temperature source Oral, resp. rate 18, height 5\' 6"  (1.676 m), weight 79.8 kg (175 lb 14.8 oz), SpO2 94.00%. Dg Chest 2 View  08/16/2014   CLINICAL DATA:  Increasing lethargy and cough.  EXAM: CHEST  2 VIEW  COMPARISON:  08/13/2014.  FINDINGS: Heart size is mildly enlarged. No pleural effusion identified. Bilateral airspace opacities are identified. When compared with the previous exam there is worsening aeration to the right upper lobe.  IMPRESSION: 1. Bilateral airspace opacities compatible with multifocal pneumonia. 2. Worsening aeration to the right upper lobe.   Electronically Signed   By: Signa Kellaylor  Stroud M.D.   On: 08/16/2014 10:42    Recent Labs  08/16/14 0735 08/17/14 0615  WBC 6.2 5.7  HGB 9.0* 8.7*  HCT 26.1* 25.4*  PLT 257 277    Recent Labs  08/16/14 0735 08/17/14 0615  NA 143 140  K 3.5* 3.4*  CL 108 106  GLUCOSE 261* 238*  BUN 19 15  CREATININE 0.96 0.97  CALCIUM 8.2* 8.6   CBG (last 3)   Recent Labs  08/16/14 1154 08/16/14 1634 08/16/14 2131  GLUCAP 199* 244* 271*    Wt Readings from Last 3 Encounters:  08/17/14 79.8 kg (175 lb 14.8 oz)  08/16/14 75 kg (165 lb 5.5 oz)    Physical Exam:  Constitutional: She appears well-developed.  HENT: oral mucosa moist, pink  Head: Normocephalic.  Eyes: EOM are normal.  Neck: Normal range of motion. Neck supple. No thyromegaly present.  Cardiovascular: Normal rate and regular rhythm. No murmurs, rubs, gallops  Respiratory:  Decreased breath sounds at the bases but clear to auscultation. No wheezes, rales, rhonchi  GI: Soft. Bowel sounds are normal. She exhibits no distension.  Neurological:  Pt is alert/oriented to name,hospital. Follows simple commands. slightly  irritable. Comprehends  basic info. Strength 4/5 UE's prox to distal. LE's: 3+HF, 4/5 ke, adf/apf. No sensory loss. Good sitting balance, dtr's 1+.  Skin: Skin is warm and dry.  Psych: a little irritable, cooperative   Assessment/Plan: 1. Functional deficits secondary to deconditioning after bilateral pneumonia---complicated by HONK which require 3+ hours per day of interdisciplinary therapy in a comprehensive inpatient rehab setting. Physiatrist is providing close team supervision and 24 hour management of active medical problems listed below. Physiatrist and rehab team continue to assess barriers to discharge/monitor patient progress toward functional and medical goals. FIM:                   Comprehension Comprehension Mode: Auditory Comprehension: 5-Follows basic conversation/direction: With extra time/assistive device  Expression Expression: 5-Expresses basic needs/ideas: With no assist  Social Interaction Social Interaction: 6-Interacts appropriately with others with medication or extra time (anti-anxiety, antidepressant).       Medical Problem List and Plan:  1. Functional deficits secondary to Deconditioning complicated by bilateral PNA and hyperosmotic non-ketotic coma  2. DVT Prophylaxis/Anticoagulation: Pharmaceutical: Lovenox  3. Pain Management: N/A  4. Mood: LCSW to follow for evaluation and support.  5. Neuropsych: This patient is capable of making decisions on her own behalf.  6. Skin/Wound Care: Routine pressure relief measures.  7. Fluids/Electrolytes/Nutrition:Monitor I/O. Encourage fluid intake.   9. DM type 2: HGB A1c-7.5. Was on Kombiglyze XR at home. Was Started on Lantus with SSI for elevated  BS.  resumed sitagliptin and amaryl.  wean off lantus. Avoid metformin   -observe for pattern today 10. Anemia: likely of chronic disease/stress state/with blood loss: Has low iron stores with elevated ferritin. Reporting rectal bleeding due to hems. Has  history of heme positive stools with long standing constipation--negative colonoscopy by Dr. Loreta Ave 05/2003   -check stools for ob  -monitor hgb daily  -consider GI follow up 11. Acute renal failure: Has resolved with IV fluids as well as medication adjustment. Continue to monitor.  12. Hypokalemia:continue to supplement  13. B-CAP: IV antibiotics d/c. Continue Levaquin D# 1/5 to complete antibiotic regimen.  14. HTN: Continue to hold HCTZ for now. lisinopriil resumed. Renal status improving   LOS (Days) 1 A FACE TO FACE EVALUATION WAS PERFORMED  Ivie Savitt T 08/17/2014 8:14 AM

## 2014-08-17 NOTE — Progress Notes (Signed)
Physical Medicine and Rehabilitation Consult  Reason for Consult: Debilitation/CAP/DKA  Referring Physician: [facility]  HPI: Catherine [name] is a 68 y.o. right-handed female with history of diabetes mellitus as well as hypertension. Admitted 08/13/2014 with increasing weakness over the past week as well as nonproductive cough. Denied any chills or fever. Husband had noted some slurred speech over 2 days as well as increasing lethargy. Chest x-ray showed bilateral pneumonia. Elevated blood sugars in the ER 568. Placed on broad-spectrum antibiotics. Strep pneumo urine screen negative. MRI of the brain completed due to confusion showing no acute abnormalities. Subcutaneous Lovenox for DVT prophylaxis. Hemoglobin A1c 7.5 with insulin therapy as directed. Occupational therapy evaluation completed noting confusion/ debilitation with request for physical medicine rehabilitation consult  Review of Systems  Respiratory: Positive for cough.  Neurological: Positive for weakness.  Psychiatric/Behavioral:  Bouts of confusion  All other systems reviewed and are negative.   Past Medical History   Diagnosis  Date   .  Diabetes mellitus without complication    .  Hypertension    .  Thyroid disease     Past Surgical History   Procedure  Laterality  Date   .  Cholecystectomy      Family History   Problem  Relation  Age of Onset   .  Diabetes Mellitus II  Mother    .  Stroke  Mother    .  Diabetes Mellitus II  Sister    .  Hypertension  Sister     Social History: reports that she has never smoked. She does not have any smokeless tobacco history on file. She reports that she does not drink alcohol or use illicit drugs.  Allergies: No Known Allergies  Medications Prior to Admission   Medication  Sig  Dispense  Refill   .  aspirin 81 MG tablet  Take 81 mg by mouth daily.     [name] Kitchen.  atorvastatin (LIPITOR) 10 MG tablet  Take 10 mg by mouth daily.     .  beta carotene w/minerals (OCUVITE) tablet  Take 1 tablet by  mouth daily.     .  Cyanocobalamin (VITAMIN B 12 PO)  Take 1,000 mg by mouth daily.     [name] Kitchen.  levothyroxine (SYNTHROID, LEVOTHROID) 50 MCG tablet  Take 50 mcg by mouth daily before breakfast.     .  lisinopril-hydrochlorothiazide (PRINZIDE,ZESTORETIC) 20-12.5 MG per tablet  Take 1 tablet by mouth daily.     .  Saxagliptin-Metformin (KOMBIGLYZE XR) 2.03-999 MG TB24  Take 1 tablet by mouth daily.      Home:  Home Living  Family/patient expects to be discharged to:: Private residence  Living Arrangements: Spouse/significant other  Available Help at Discharge: Family;Available 24 hours/day  Type of Home: House  Home Access: Stairs to enter  Entergy CorporationEntrance Stairs-Number of Steps: 1  Home Layout: One level  Home Equipment: None  Additional Comments: husband answering questions for pt, although pt capable  Functional History:  Prior Function  Level of Independence: Independent  Comments: pt reports she drives and does IADL  Functional Status:  Mobility:  Bed Mobility  Overal bed mobility: Needs Assistance  Bed Mobility: Supine to Sit;Sit to Supine  Supine to sit: Mod assist  Sit to supine: Mod assist  General bed mobility comments: moves slowly due to lethargy, assist for LEs and to guide trunk  Transfers  Overall transfer level: Needs assistance  Equipment used: 1 person hand held assist  Transfers: Sit to/from Stand  Sit  to Stand: Min assist  General transfer comment: assist to steady, pt able to power up, extra time  Ambulation/Gait  General Gait Details: Deferred due to pt fatigue and unsteadiness.   ADL:  ADL  Overall ADL's : Needs assistance/impaired  Eating/Feeding: NPO  Grooming: Wash/dry face;Set up;Sitting  Upper Body Dressing : Minimal assistance;Sitting (supported sitting)  Lower Body Dressing: Sit to/from stand;Moderate assistance  Lower Body Dressing Details (indicate cue type and reason): pt with minimal attempt to donn socks, appearing fatigued  Toilet Transfer: Minimal  assistance;Stand-pivot;BSC  Toileting- Clothing Manipulation and Hygiene: Sitting/lateral lean;Minimal assistance  General ADL Comments: Pt with generalized weakness and lethargy.  Cognition:  Cognition  Overall Cognitive Status: Difficult to assess  Arousal/Alertness: Awake/alert  Orientation Level: Oriented X4  Attention: Sustained  Sustained Attention: Impaired  Sustained Attention Impairment: Verbal basic;Functional basic  Memory: Impaired  Memory Impairment: Decreased recall of new information  Awareness: Impaired  Awareness Impairment: Intellectual impairment  Problem Solving: Impaired  Problem Solving Impairment: Functional basic  Safety/Judgment: Impaired  Cognition  Arousal/Alertness: Lethargic  Behavior During Therapy: Flat affect  Overall Cognitive Status: Difficult to assess  Difficult to assess due to: Level of arousal  Blood pressure 133/57, pulse 65, temperature 97.7 F (36.5 C), temperature source Oral, resp. rate 18, height 5\' 5"  (1.651 m), weight 74.9 kg (165 lb 2 oz), SpO2 96.00%.  Physical Exam  Constitutional: She appears well-developed.  HENT:  Head: Normocephalic.  Eyes: EOM are normal.  Neck: Normal range of motion. Neck supple. No thyromegaly present.  Cardiovascular: Normal rate and regular rhythm.  Respiratory:  Decreased breath sounds at the bases but clear to auscultation  GI: Soft. Bowel sounds are normal. She exhibits no distension.  Neurological:  Mood is flat but appropriate. She mostly Her eyes closed during exam. She was able to provide her name, age and date of birth. Follows simple commands.  Skin: Skin is warm and dry.   Results for orders placed during the hospital encounter of 08/13/14 (from the past 24 hour(s))   GLUCOSE, CAPILLARY Status: Abnormal    Collection Time    08/14/14 11:26 AM   Result  Value  Ref Range    Glucose-Capillary  213 (*)  70 - 99 mg/dL    Comment 1  Capillary Sample    GLUCOSE, CAPILLARY Status: Abnormal     Collection Time    08/14/14 12:10 PM   Result  Value  Ref Range    Glucose-Capillary  211 (*)  70 - 99 mg/dL   GLUCOSE, CAPILLARY Status: Abnormal    Collection Time    08/14/14 1:13 PM   Result  Value  Ref Range    Glucose-Capillary  217 (*)  70 - 99 mg/dL    Comment 1  Capillary Sample    GLUCOSE, CAPILLARY Status: Abnormal    Collection Time    08/14/14 2:03 PM   Result  Value  Ref Range    Glucose-Capillary  214 (*)  70 - 99 mg/dL    Comment 1  Capillary Sample    GLUCOSE, CAPILLARY Status: Abnormal    Collection Time    08/14/14 2:59 PM   Result  Value  Ref Range    Glucose-Capillary  184 (*)  70 - 99 mg/dL    Comment 1  Capillary Sample    GLUCOSE, CAPILLARY Status: Abnormal    Collection Time    08/14/14 3:49 PM   Result  Value  Ref Range    Glucose-Capillary  156 (*)  70 - 99 mg/dL    Comment 1  Capillary Sample    COMPREHENSIVE METABOLIC PANEL Status: Abnormal    Collection Time    08/14/14 4:30 PM   Result  Value  Ref Range    Sodium  152 (*)  137 - 147 mEq/L    Potassium  3.5 (*)  3.7 - 5.3 mEq/L    Chloride  115 (*)  96 - 112 mEq/L    CO2  22  19 - 32 mEq/L    Glucose, Bld  147 (*)  70 - 99 mg/dL    BUN  55 (*)  6 - 23 mg/dL    Creatinine, Ser  6.29 (*)  0.50 - 1.10 mg/dL    Calcium  9.0  8.4 - 10.5 mg/dL    Total Protein  7.2  6.0 - 8.3 g/dL    Albumin  2.2 (*)  3.5 - 5.2 g/dL    AST  95 (*)  0 - 37 U/L    ALT  73 (*)  0 - 35 U/L    Alkaline Phosphatase  86  39 - 117 U/L    Total Bilirubin  0.3  0.3 - 1.2 mg/dL    GFR calc non Af Amer  35 (*)  >90 mL/min    GFR calc Af Amer  40 (*)  >90 mL/min    Anion gap  15  5 - 15   GLUCOSE, CAPILLARY Status: Abnormal    Collection Time    08/14/14 4:46 PM   Result  Value  Ref Range    Glucose-Capillary  143 (*)  70 - 99 mg/dL   GLUCOSE, CAPILLARY Status: Abnormal    Collection Time    08/14/14 5:42 PM   Result  Value  Ref Range    Glucose-Capillary  116 (*)  70 - 99 mg/dL   URINE RAPID DRUG SCREEN (HOSP  PERFORMED) Status: None    Collection Time    08/14/14 5:44 PM   Result  Value  Ref Range    Opiates  NONE DETECTED  NONE DETECTED    Cocaine  NONE DETECTED  NONE DETECTED    Benzodiazepines  NONE DETECTED  NONE DETECTED    Amphetamines  NONE DETECTED  NONE DETECTED    Tetrahydrocannabinol  NONE DETECTED  NONE DETECTED    Barbiturates  NONE DETECTED  NONE DETECTED   GLUCOSE, CAPILLARY Status: Abnormal    Collection Time    08/14/14 6:39 PM   Result  Value  Ref Range    Glucose-Capillary  117 (*)  70 - 99 mg/dL   GLUCOSE, CAPILLARY Status: Abnormal    Collection Time    08/14/14 8:04 PM   Result  Value  Ref Range    Glucose-Capillary  160 (*)  70 - 99 mg/dL   GLUCOSE, CAPILLARY Status: Abnormal    Collection Time    08/14/14 9:02 PM   Result  Value  Ref Range    Glucose-Capillary  221 (*)  70 - 99 mg/dL   GLUCOSE, CAPILLARY Status: Abnormal    Collection Time    08/14/14 10:03 PM   Result  Value  Ref Range    Glucose-Capillary  231 (*)  70 - 99 mg/dL   BASIC METABOLIC PANEL Status: Abnormal    Collection Time    08/14/14 10:30 PM   Result  Value  Ref Range    Sodium  151 (*)  137 - 147 mEq/L  Potassium  3.2 (*)  3.7 - 5.3 mEq/L    Chloride  117 (*)  96 - 112 mEq/L    CO2  20  19 - 32 mEq/L    Glucose, Bld  231 (*)  70 - 99 mg/dL    BUN  40 (*)  6 - 23 mg/dL    Creatinine, Ser  1.61 (*)  0.50 - 1.10 mg/dL    Calcium  8.4  8.4 - 10.5 mg/dL    GFR calc non Af Amer  45 (*)  >90 mL/min    GFR calc Af Amer  52 (*)  >90 mL/min    Anion gap  14  5 - 15   GLUCOSE, CAPILLARY Status: Abnormal    Collection Time    08/14/14 11:07 PM   Result  Value  Ref Range    Glucose-Capillary  222 (*)  70 - 99 mg/dL   GLUCOSE, CAPILLARY Status: Abnormal    Collection Time    08/15/14 12:10 AM   Result  Value  Ref Range    Glucose-Capillary  202 (*)  70 - 99 mg/dL   GLUCOSE, CAPILLARY Status: Abnormal    Collection Time    08/15/14 1:01 AM   Result  Value  Ref Range     Glucose-Capillary  184 (*)  70 - 99 mg/dL   CBC WITH DIFFERENTIAL Status: Abnormal    Collection Time    08/15/14 1:37 AM   Result  Value  Ref Range    WBC  8.3  4.0 - 10.5 K/uL    RBC  2.65 (*)  3.87 - 5.11 MIL/uL    Hemoglobin  7.8 (*)  12.0 - 15.0 g/dL    HCT  09.6 (*)  04.5 - 46.0 %    MCV  86.4  78.0 - 100.0 fL    MCH  29.4  26.0 - 34.0 pg    MCHC  34.1  30.0 - 36.0 g/dL    RDW  40.9  81.1 - 91.4 %    Platelets  256  150 - 400 K/uL    Neutrophils Relative %  95 (*)  43 - 77 %    Lymphocytes Relative  4 (*)  12 - 46 %    Monocytes Relative  1 (*)  3 - 12 %    Eosinophils Relative  0  0 - 5 %    Basophils Relative  0  0 - 1 %    Neutro Abs  7.9 (*)  1.7 - 7.7 K/uL    Lymphs Abs  0.3 (*)  0.7 - 4.0 K/uL    Monocytes Absolute  0.1  0.1 - 1.0 K/uL    Eosinophils Absolute  0.0  0.0 - 0.7 K/uL    Basophils Absolute  0.0  0.0 - 0.1 K/uL    WBC Morphology  TOXIC GRANULATION    COMPREHENSIVE METABOLIC PANEL Status: Abnormal    Collection Time    08/15/14 1:37 AM   Result  Value  Ref Range    Sodium  150 (*)  137 - 147 mEq/L    Potassium  3.3 (*)  3.7 - 5.3 mEq/L    Chloride  117 (*)  96 - 112 mEq/L    CO2  20  19 - 32 mEq/L    Glucose, Bld  195 (*)  70 - 99 mg/dL    BUN  36 (*)  6 - 23 mg/dL    Creatinine, Ser  7.82 (*)  0.50 - 1.10 mg/dL    Calcium  8.5  8.4 - 10.5 mg/dL    Total Protein  6.2  6.0 - 8.3 g/dL    Albumin  1.9 (*)  3.5 - 5.2 g/dL    AST  77 (*)  0 - 37 U/L    ALT  63 (*)  0 - 35 U/L    Alkaline Phosphatase  71  39 - 117 U/L    Total Bilirubin  0.2 (*)  0.3 - 1.2 mg/dL    GFR calc non Af Amer  47 (*)  >90 mL/min    GFR calc Af Amer  54 (*)  >90 mL/min    Anion gap  13  5 - 15   LACTIC ACID, PLASMA Status: None    Collection Time    08/15/14 1:37 AM   Result  Value  Ref Range    Lactic Acid, Venous  1.8  0.5 - 2.2 mmol/L   PROCALCITONIN Status: None    Collection Time    08/15/14 1:37 AM   Result  Value  Ref Range    Procalcitonin  0.19    MAGNESIUM  Status: None    Collection Time    08/15/14 1:37 AM   Result  Value  Ref Range    Magnesium  2.0  1.5 - 2.5 mg/dL   BASIC METABOLIC PANEL Status: Abnormal    Collection Time    08/15/14 1:37 AM   Result  Value  Ref Range    Sodium  151 (*)  137 - 147 mEq/L    Potassium  3.3 (*)  3.7 - 5.3 mEq/L    Chloride  118 (*)  96 - 112 mEq/L    CO2  21  19 - 32 mEq/L    Glucose, Bld  198 (*)  70 - 99 mg/dL    BUN  36 (*)  6 - 23 mg/dL    Creatinine, Ser  1.61 (*)  0.50 - 1.10 mg/dL    Calcium  8.5  8.4 - 10.5 mg/dL    GFR calc non Af Amer  46 (*)  >90 mL/min    GFR calc Af Amer  54 (*)  >90 mL/min    Anion gap  12  5 - 15   GLUCOSE, CAPILLARY Status: Abnormal    Collection Time    08/15/14 2:00 AM   Result  Value  Ref Range    Glucose-Capillary  194 (*)  70 - 99 mg/dL    Comment 1  Capillary Sample    GLUCOSE, CAPILLARY Status: Abnormal    Collection Time    08/15/14 3:05 AM   Result  Value  Ref Range    Glucose-Capillary  175 (*)  70 - 99 mg/dL   GLUCOSE, CAPILLARY Status: Abnormal    Collection Time    08/15/14 4:11 AM   Result  Value  Ref Range    Glucose-Capillary  180 (*)  70 - 99 mg/dL   GLUCOSE, CAPILLARY Status: Abnormal    Collection Time    08/15/14 5:03 AM   Result  Value  Ref Range    Glucose-Capillary  170 (*)  70 - 99 mg/dL    Comment 1  Capillary Sample    BASIC METABOLIC PANEL Status: Abnormal    Collection Time    08/15/14 5:16 AM   Result  Value  Ref Range    Sodium  150 (*)  137 - 147 mEq/L    Potassium  3.6 (*)  3.7 - 5.3 mEq/L    Chloride  117 (*)  96 - 112 mEq/L    CO2  20  19 - 32 mEq/L    Glucose, Bld  180 (*)  70 - 99 mg/dL    BUN  32 (*)  6 - 23 mg/dL    Creatinine, Ser  1.61 (*)  0.50 - 1.10 mg/dL    Calcium  8.4  8.4 - 10.5 mg/dL    GFR calc non Af Amer  50 (*)  >90 mL/min    GFR calc Af Amer  58 (*)  >90 mL/min    Anion gap  13  5 - 15   GLUCOSE, CAPILLARY Status: Abnormal    Collection Time    08/15/14 5:58 AM   Result  Value  Ref Range     Glucose-Capillary  187 (*)  70 - 99 mg/dL    Comment 1  Capillary Sample    GLUCOSE, CAPILLARY Status: Abnormal    Collection Time    08/15/14 7:54 AM   Result  Value  Ref Range    Glucose-Capillary  154 (*)  70 - 99 mg/dL    Comment 1  Capillary Sample     Ct Head Wo Contrast  08/13/2014 CLINICAL DATA: Hypotensive, general malaise, tachycardia, hyperglycemic EXAM: CT HEAD WITHOUT CONTRAST TECHNIQUE: Contiguous axial images were obtained from the base of the skull through the vertex without intravenous contrast. COMPARISON: None. FINDINGS: No evidence of parenchymal hemorrhage or extra-axial fluid collection. No mass lesion, mass effect, or midline shift. No CT evidence of acute infarction. Cerebral volume is within normal limits. No ventriculomegaly. Partial opacification of the right frontal and ethmoid sinuses. The mastoid air cells are unopacified. No evidence of calvarial fracture. IMPRESSION: No evidence of acute intracranial abnormality. Electronically Signed By: Charline Bills M.D. On: 08/13/2014 20:29  Mr Brain Wo Contrast  08/14/2014 CLINICAL DATA: Slurred speech and weakness. EXAM: MRI HEAD WITHOUT CONTRAST TECHNIQUE: Multiplanar, multiecho pulse sequences of the brain and surrounding structures were obtained without intravenous contrast. COMPARISON: Head CT 08/13/2014 FINDINGS: There is no evidence of acute infarct, intracranial hemorrhage, mass, midline shift, or extra-axial fluid collection. Scattered, small foci of T2 hyperintensity in the subcortical and deep cerebral white matter and brainstem are nonspecific but compatible with mild chronic small vessel ischemic disease. There is mild generalized cerebral atrophy, within normal limits for age. Orbits are unremarkable. Partial right sphenoid sinus opacification is noted as well as mild right inferior frontal sinus and right anterior ethmoid air cell mucosal thickening. Trace right mastoid fluid. Major intracranial vascular flow voids  are preserved. IMPRESSION: 1. No acute intracranial abnormality. 2. Mild chronic small vessel ischemic disease. Electronically Signed By: Sebastian Ache On: 08/14/2014 12:12  Dg Chest Port 1 View  08/13/2014 CLINICAL DATA: Fever and cough ; hypoxia EXAM: PORTABLE CHEST - 1 VIEW COMPARISON: None. FINDINGS: There is patchy alveolar opacity in both mid and lower lung zones. Heart is upper normal in size with pulmonary vascularity within normal limits. No effusions. No adenopathy. No bone lesions. IMPRESSION: Bilateral mid and lower lung zone opacity. Suspect bilateral pneumonia given the appearance, although a slightly atypical appearance of congestive heart failure could present in this manner. Both entities may exist concurrently. No adenopathy apparent. Electronically Signed By: Bretta Bang M.D. On: 08/13/2014 18:30   Assessment/Plan:  Diagnosis: deconditioning related to pneumonia/dka  1. Does the need for close, 24 hr/day medical supervision in concert with the patient's rehab needs make it  unreasonable for this patient to be served in a less intensive setting? Yes 2. Co-Morbidities requiring supervision/potential complications: acute renal failure 3. Due to bladder management, bowel management, safety, skin/wound care, disease management, medication administration, pain management and patient education, does the patient require 24 hr/day rehab nursing? Yes 4. Does the patient require coordinated care of a physician, rehab nurse, PT (1-2 hrs/day, 5 days/week), OT (1-2 hrs/day, 5 days/week) and SLP (1-2 hrs/day, 5 days/week) to address physical and functional deficits in the context of the above medical diagnosis(es)? Yes Addressing deficits in the following areas: balance, endurance, locomotion, strength, transferring, bowel/bladder control, bathing, dressing, feeding, grooming and toileting 5. Can the patient actively participate in an intensive therapy program of at least 3 hrs of therapy per day at  least 5 days per week? Yes 6. The potential for patient to make measurable gains while on inpatient rehab is excellent 7. Anticipated functional outcomes upon discharge from inpatient rehab are modified independent with PT, modified independent and supervision with OT, n/a with SLP. 8. Estimated rehab length of stay to reach the above functional goals is: 10-14 days 9. Does the patient have adequate social supports to accommodate these discharge functional goals? Yes 10. Anticipated D/C setting: Home 11. Anticipated post D/C treatments: HH therapy 12. Overall Rehab/Functional Prognosis: excellent RECOMMENDATIONS:  This patient's condition is appropriate for continued rehabilitative care in the following setting: CIR  Patient has agreed to participate in recommended program. Yes  Note that insurance prior authorization may be required for reimbursement for recommended care.  Comment: Rehab Admissions Coordinator to follow up.  Thanks,  Ranelle Oyster, MD, [place] [facility]  08/15/2014  Revision History...      Date/Time User Action    08/15/2014 5:40 PM Ranelle Oyster, MD Sign    08/15/2014 10:40 AM Charlton Amor, PA-C [place]   View Details Report    Routing History.[name] KitchenMarland Kitchen

## 2014-08-18 ENCOUNTER — Encounter (HOSPITAL_COMMUNITY): Payer: Self-pay | Admitting: *Deleted

## 2014-08-18 ENCOUNTER — Inpatient Hospital Stay (HOSPITAL_COMMUNITY): Payer: Medicare Other | Admitting: Physical Therapy

## 2014-08-18 ENCOUNTER — Inpatient Hospital Stay (HOSPITAL_COMMUNITY): Payer: Medicare Other | Admitting: Occupational Therapy

## 2014-08-18 DIAGNOSIS — N179 Acute kidney failure, unspecified: Secondary | ICD-10-CM

## 2014-08-18 LAB — GLUCOSE, CAPILLARY
GLUCOSE-CAPILLARY: 237 mg/dL — AB (ref 70–99)
GLUCOSE-CAPILLARY: 200 mg/dL — AB (ref 70–99)
GLUCOSE-CAPILLARY: 181 mg/dL — AB (ref 70–99)
Glucose-Capillary: 228 mg/dL — ABNORMAL HIGH (ref 70–99)

## 2014-08-18 LAB — HEMOGLOBIN AND HEMATOCRIT, BLOOD
HEMATOCRIT: 22.8 % — AB (ref 36.0–46.0)
Hemoglobin: 7.9 g/dL — ABNORMAL LOW (ref 12.0–15.0)

## 2014-08-18 NOTE — IPOC Note (Addendum)
Overall Plan of Care [facility](IPOC) Patient Details Name: Governor SpeckingBarbara P Deman MRN: [id number] DOB: Jul 16, 1946  Admitting Diagnosis: Deconditioned after PNA  Hospital Problems: Active Problems:   Physical debility     Functional Problem List: Nursing Bladder;Endurance;Motor;Safety;Skin Integrity  PT Balance;Edema;Endurance;Pain;Safety;Sensory  OT Balance;Cognition;Safety;Motor;Endurance  SLP    TR Activity tolerance, functional mobility, balance, cognition, safety, pain       Basic ADL's: OT Grooming;Bathing;Dressing;Toileting     Advanced  ADL's: OT Simple Meal Preparation     Transfers: PT Bed Mobility;Bed to Chair;Car;Furniture;Floor  OT Toilet;Tub/Shower     Locomotion: PT Ambulation;Stairs;Wheelchair Mobility     Additional Impairments: OT None  SLP        TR      Anticipated Outcomes Item Anticipated Outcome  Self Feeding n/a  Swallowing      Basic self-care  Mod I-supervision  Toileting  Mod I    Bathroom Transfers Mod I toilet transfer and supervision bathing   Bowel/Bladder  Continent of bowel and bladder  Transfers  Supervision-Mod I  Locomotion  Supervision-Mod I  Communication     Cognition     Pain  No complaint of pain  Safety/Judgment  No falls at discharge   Therapy Plan: PT Intensity: Minimum of 1-2 x/day ,45 to 90 minutes PT Frequency: 5 out of 7 days PT Duration Estimated Length of Stay: 7-10 days OT Intensity: Minimum of 1-2 x/day, 45 to 90 minutes OT Frequency: 5 out of 7 days OT Duration/Estimated Length of Stay: 7-10 days   TR Duration/ELOS:  7Days TR Frequency:  Min 1 time per week >20 minutes        Team Interventions: Nursing Interventions Bladder Management;Disease Management/Prevention;Medication Management;Psychosocial Support  PT interventions Ambulation/gait training;Balance/vestibular training;Cognitive remediation/compensation;Discharge planning;Functional mobility training;DME/adaptive equipment  instruction;Neuromuscular re-education;Patient/family education;Stair training;Therapeutic Activities;Therapeutic Exercise;UE/LE Strength taining/ROM;UE/LE Coordination activities;Wheelchair propulsion/positioning  OT Interventions Cognitive remediation/compensation;Balance/vestibular training;Community reintegration;Discharge planning;DME/adaptive equipment instruction;Functional mobility training;Psychosocial support;Patient/family education;Self Care/advanced ADL retraining;Therapeutic Activities;Therapeutic Exercise;UE/LE Strength taining/ROM;UE/LE Coordination activities  SLP Interventions    TR Interventions Recreation/leisure participation, Balance/Vestibular training, functional mobility, therapeutic activities, UE/LE strength/coordination, w/c mobility, community reintegration, pt/family education, adaptive equipment instruction/use, discharge planning, psychosocial support  SW/CM Interventions Discharge Planning;Psychosocial Support;Patient/Family Education    Team Discharge Planning: Destination: PT-Home ,OT- Home , SLP-  Projected Follow-up: PT-24 hour supervision/assistance;Home health PT, OT-  None, SLP-  Projected Equipment Needs: PT-To be determined, OT- To be determined, SLP-  Equipment Details: PT- , OT-  Patient/family involved in discharge planning: PT- Patient,  OT-Patient, SLP-   MD ELOS: 7-10 days Medical Rehab Prognosis:  Excellent Assessment: The patient has been admitted for CIR therapies with the diagnosis of deconditioning. The team will be addressing functional mobility, strength, stamina, balance, safety, adaptive techniques and equipment, self-care, bowel and bladder mgt, patient and caregiver education, safety, cognitive perceptual awareness. Goals have been set at mod I to supervision. Ranelle Oyster.    Zachary T. Swartz, MD, FAAPMR      See Team Conference Notes for weekly updates to the plan of care

## 2014-08-18 NOTE — Progress Notes (Signed)
Occupational Therapy Session Note  Patient Details  Name: Catherine SpeckingBarbara P [name] MRN: [id number] Date of Birth: 1946-06-16  Today's Date: 08/18/2014 OT Individual Time: 1040-1140 OT Individual Time Calculation (min): 60 min    Skilled Therapeutic Interventions/Progress Updates: ADL in room shower with focus on functional mobility w/ RW, toileting clothing mgmt, and standing tolerance.   Patient overall CGA.   She held to grab bar to raise bottom off toilet seat to complete pericleansing.     Therapy Documentation Precautions:  Precautions Precautions: Fall Restrictions Weight Bearing Restrictions: No  Pain: denied    See FIM for current functional status  Therapy/Group: Individual Therapy  Bud Faceickett, Andee Chivers [facility], LLCYeary 08/18/2014, 3:11 PM

## 2014-08-18 NOTE — Progress Notes (Signed)
Physical Therapy Session Note  Patient Details  Name: Catherine [name] MRN: [id number] Date of Birth: 15-Dec-1945  Today's Date: 08/18/2014 PT Individual Time: 0901-1000 PT Individual Time Calculation (min): 59 min   Short Term Goals: Week 1:  PT Short Term Goal 1 (Week 1): STG's=LTG's secondary to anticipated LOS.  Skilled Therapeutic Interventions/Progress Updates:  Pt was seen bedside in the am. Pt performed multiple sit to stand transfers with min guard. Pt ambulated 125 feet x 2 with hand hold assist and min guard. No LOB with step through gait pattern and decrease cadence. Performed 3 sets x 10 reps each for step ups and alternating step ups. Also performed side stepping R and L 20 feet x 3 and walking backwards 30 feet x 1.   Therapy Documentation Precautions:  Precautions Precautions: Fall Restrictions Weight Bearing Restrictions: No General:   Pain: No c/o pain.    Locomotion : Ambulation Ambulation/Gait Assistance: 4: Min guard   See FIM for current functional status  Therapy/Group: Individual Therapy  Rayford HalstedMitchell, Sapir Lavey G 08/18/2014, 12:34 PM

## 2014-08-18 NOTE — Progress Notes (Signed)
[place] PHYSICAL MEDICINE & REHABILITATION     PROGRESS NOTE    Subjective/Complaints: Worked hard in therapy. Sore but feeling ok otherwise. No rectal bleeding reported. Good appetite  Objective: Vital Signs: Blood pressure 132/63, pulse 65, temperature 97.8 F (36.6 C), temperature source Oral, resp. rate 18, height 5\' 6"  (1.676 m), weight 81.9 kg (180 lb 8.9 oz), SpO2 94.00%. Dg Chest 2 View  08/16/2014   CLINICAL DATA:  Increasing lethargy and cough.  EXAM: CHEST  2 VIEW  COMPARISON:  08/13/2014.  FINDINGS: Heart size is mildly enlarged. No pleural effusion identified. Bilateral airspace opacities are identified. When compared with the previous exam there is worsening aeration to the right upper lobe.  IMPRESSION: 1. Bilateral airspace opacities compatible with multifocal pneumonia. 2. Worsening aeration to the right upper lobe.   Electronically Signed   By: Signa Kellaylor  Stroud M.D.   On: 08/16/2014 10:42    Recent Labs  08/16/14 0735 08/17/14 0615 08/18/14 0615  WBC 6.2 5.7  --   HGB 9.0* 8.7* 7.9*  HCT 26.1* 25.4* 22.8*  PLT 257 277  --     Recent Labs  08/16/14 0735 08/17/14 0615  NA 143 140  K 3.5* 3.4*  CL 108 106  GLUCOSE 261* 238*  BUN 19 15  CREATININE 0.96 0.97  CALCIUM 8.2* 8.6   CBG (last 3)   Recent Labs  08/17/14 1653 08/17/14 2132 08/18/14 0706  GLUCAP 194* 196* 237*    Wt Readings from Last 3 Encounters:  08/18/14 81.9 kg (180 lb 8.9 oz)  08/16/14 75 kg (165 lb 5.5 oz)    Physical Exam:  Constitutional: She appears well-developed.  HENT: oral mucosa moist, pink  Head: Normocephalic.  Eyes: EOM are normal.  Neck: Normal range of motion. Neck supple. No thyromegaly present.  Cardiovascular: Normal rate and regular rhythm. No murmurs, rubs, gallops  Respiratory:  Decreased breath sounds at the bases but clear to auscultation. No wheezes, rales, rhonchi  GI: Soft. Bowel sounds are normal. She exhibits no distension.  Neurological:  Pt is  alert/oriented to name,hospital. Follows simple commands. slightly irritable. Comprehends  basic info. Strength 4/5 UE's prox to distal. LE's: 3+HF, 4/5 ke, adf/apf. No sensory loss. Good sitting balance, dtr's 1+.  Skin: Skin is warm and dry.  Psych: a little irritable, cooperative   Assessment/Plan: 1. Functional deficits secondary to deconditioning after bilateral pneumonia---complicated by HONK which require 3+ hours per day of interdisciplinary therapy in a comprehensive inpatient rehab setting. Physiatrist is providing close team supervision and 24 hour management of active medical problems listed below. Physiatrist and rehab team continue to assess barriers to discharge/monitor patient progress toward functional and medical goals. FIM: FIM - Bathing Bathing Steps Patient Completed: Chest;Right Arm;Left Arm;Abdomen;Left upper leg;Right upper leg;Buttocks;Front perineal area;Right lower leg (including foot);Left lower leg (including foot) Bathing: 5: Supervision: Safety issues/verbal cues  FIM - Upper Body Dressing/Undressing Upper body dressing/undressing steps patient completed: Thread/unthread right sleeve of pullover shirt/dresss;Thread/unthread left sleeve of pullover shirt/dress;Put head through opening of pull over shirt/dress Upper body dressing/undressing: 4: Min-Patient completed 75 plus % of tasks FIM - Lower Body Dressing/Undressing Lower body dressing/undressing steps patient completed: Thread/unthread right underwear leg;Thread/unthread left underwear leg;Pull underwear up/down;Thread/unthread right pants leg;Thread/unthread left pants leg;Pull pants up/down;Don/Doff right sock;Don/Doff left sock Lower body dressing/undressing: 4: Steadying Assist  FIM - Toileting Toileting steps completed by patient: Adjust clothing prior to toileting;Performs perineal hygiene;Adjust clothing after toileting Toileting Assistive Devices: Grab bar or rail for support Toileting:  4: Steadying  assist  FIM - Archivist Transfers: 4-To toilet/BSC: Min A (steadying Pt. > 75%);4-From toilet/BSC: Min A (steadying Pt. > 75%)  FIM - Banker Devices:  (HHA) Bed/Chair Transfer: 5: Supine > Sit: Supervision (verbal cues/safety issues);5: Sit > Supine: Supervision (verbal cues/safety issues);4: Bed > Chair or W/C: Min A (steadying Pt. > 75%);4: Chair or W/C > Bed: Min A (steadying Pt. > 75%)  FIM - Locomotion: Wheelchair Distance: 75 Locomotion: Wheelchair: 2: Travels 50 - 149 ft with supervision, cueing or coaxing FIM - Locomotion: Ambulation Locomotion: Ambulation Assistive Devices: Other (comment) (HHA) Ambulation/Gait Assistance: 4: Min assist Locomotion: Ambulation: 2: Travels 50 - 149 ft with minimal assistance (Pt.>75%)  Comprehension Comprehension Mode: Auditory Comprehension: 5-Follows basic conversation/direction: With no assist  Expression Expression Mode: Verbal Expression: 6-Expresses complex ideas: With extra time/assistive device  Social Interaction Social Interaction: 6-Interacts appropriately with others with medication or extra time (anti-anxiety, antidepressant).  Problem Solving Problem Solving: 6-Solves complex problems: With extra time  Memory Memory: 6-More than reasonable amt of time Medical Problem List and Plan:  1. Functional deficits secondary to Deconditioning complicated by bilateral PNA and hyperosmotic non-ketotic coma  2. DVT Prophylaxis/Anticoagulation: Pharmaceutical: Lovenox  3. Pain Management: N/A  4. Mood: LCSW to follow for evaluation and support.  5. Neuropsych: This patient is capable of making decisions on her own behalf.  6. Skin/Wound Care: Routine pressure relief measures.  7. Fluids/Electrolytes/Nutrition:Monitor I/O. Encourage fluid intake.   9. DM type 2: HGB A1c-7.5. Was on Kombiglyze XR at home. Was Started on Lantus with SSI for elevated BS.  resumed sitagliptin and  amaryl.  wean off lantus. Avoid metformin   -observe for pattern today 10. Anemia: likely of chronic disease/stress state/with blood loss: Has low iron stores with elevated ferritin. Reporting rectal bleeding due to hems. Has history of heme positive stools with long standing constipation--negative colonoscopy by Dr. Loreta Ave 05/2003   -need to check stools for ob  -hgb has ranged from 7.8 to 9.1 during this stay. Some dilutional effect likely  -consider GI follow up if active bleeding seen  -daily hgb 11. Acute renal failure: Has resolved with IV fluids as well as medication adjustment. Continue to monitor.  12. Hypokalemia:continue to supplement  13. B-CAP: IV antibiotics d/c. Continue Levaquin D# 3/5 to complete antibiotic regimen.  14. HTN: Continue to hold HCTZ for now. lisinopriil resumed. Renal status improving   LOS (Days) 2 A FACE TO FACE EVALUATION WAS PERFORMED  Mildred Tuccillo T 08/18/2014 8:10 AM

## 2014-08-18 NOTE — Progress Notes (Signed)
Physical Therapy Session Note  Patient Details  Name: Governor SpeckingBarbara P Folkerts MRN: [id number] Date of Birth: August 06, 1946  Today's Date: 08/18/2014 PT Individual Time: 1330-1430 PT Individual Time Calculation (min): 60 min   Short Term Goals: Week 1:  PT Short Term Goal 1 (Week 1): STG's=LTG's secondary to anticipated LOS.  Skilled Therapeutic Interventions/Progress Updates:  Pt was seen bedside in the pm. Pt ambulated from room to rehab gym with min guard without assistive device. Pt states "she just didn't feel steady ambulating without hand hold assist or assistive device." Pt rode Nu-step 10 mins on level 2 for LE strengthening and flexibility. Pt ambulated 200 x 3 with st cane initially requiring min guard to S progressing to S by end of therapy session. Occasional verbal cues for sequencing. Pt ascended and descended 1 step x 4 with no rails and st cane, min A and verbal cues required. Pt returned to room with st cane and S.    Therapy Documentation Precautions:  Precautions Precautions: Fall Restrictions Weight Bearing Restrictions: No General:   Pain: No c/o pain.    Locomotion : Ambulation Ambulation/Gait Assistance: 5: Supervision   See FIM for current functional status  Therapy/Group: Individual Therapy  Rayford HalstedMitchell, Vianna Venezia G 08/18/2014, 3:03 PM

## 2014-08-19 ENCOUNTER — Inpatient Hospital Stay (HOSPITAL_COMMUNITY): Payer: Medicare Other | Admitting: Occupational Therapy

## 2014-08-19 LAB — GLUCOSE, CAPILLARY
GLUCOSE-CAPILLARY: 237 mg/dL — AB (ref 70–99)
Glucose-Capillary: 179 mg/dL — ABNORMAL HIGH (ref 70–99)
Glucose-Capillary: 176 mg/dL — ABNORMAL HIGH (ref 70–99)
Glucose-Capillary: 212 mg/dL — ABNORMAL HIGH (ref 70–99)

## 2014-08-19 LAB — IRON AND TIBC
IRON: 76 ug/dL (ref 42–135)
SATURATION RATIOS: 36 % (ref 20–55)
TIBC: 211 ug/dL — ABNORMAL LOW (ref 250–470)
UIBC: 135 ug/dL (ref 125–400)

## 2014-08-19 LAB — HEMOGLOBIN AND HEMATOCRIT, BLOOD
HCT: 21.9 % — ABNORMAL LOW (ref 36.0–46.0)
Hemoglobin: 7.5 g/dL — ABNORMAL LOW (ref 12.0–15.0)

## 2014-08-19 LAB — FERRITIN: FERRITIN: 312 ng/mL — AB (ref 10–291)

## 2014-08-19 NOTE — Progress Notes (Signed)
[place] PHYSICAL MEDICINE & REHABILITATION     PROGRESS NOTE    Subjective/Complaints: At EOB eating breakfast. No complaints. Denies pain, dizziness, sob, cp. Good appetite Good appetite  Objective: Vital Signs: Blood pressure 115/49, pulse 71, temperature 97.8 F (36.6 C), temperature source Oral, resp. rate 18, height 5\' 6"  (1.676 m), weight 82.2 kg (181 lb 3.5 oz), SpO2 96.00%. No results found.  Recent Labs  08/16/14 0735 08/17/14 0615 08/18/14 0615 08/19/14 0455  WBC 6.2 5.7  --   --   HGB 9.0* 8.7* 7.9* 7.5*  HCT 26.1* 25.4* 22.8* 21.9*  PLT 257 277  --   --     Recent Labs  08/16/14 0735 08/17/14 0615  NA 143 140  K 3.5* 3.4*  CL 108 106  GLUCOSE 261* 238*  BUN 19 15  CREATININE 0.96 0.97  CALCIUM 8.2* 8.6   CBG (last 3)   Recent Labs  08/18/14 1134 08/18/14 1637 08/18/14 2058  GLUCAP 200* 228* 181*    Wt Readings from Last 3 Encounters:  08/19/14 82.2 kg (181 lb 3.5 oz)  08/16/14 75 kg (165 lb 5.5 oz)    Physical Exam:  Constitutional: She appears well-developed.  HENT: oral mucosa moist, pink  Head: Normocephalic.  Eyes: EOM are normal.  Neck: Normal range of motion. Neck supple. No thyromegaly present.  Cardiovascular: Normal rate and regular rhythm. No murmurs, rubs, gallops  Respiratory:  Decreased breath sounds at the bases but clear to auscultation. No wheezes, rales, rhonchi  GI: Soft. Bowel sounds are normal. She exhibits no distension.  Neurological:  Pt is alert/oriented to name,hospital. Follows simple commands. slightly irritable. Comprehends  basic info. Strength 4/5 UE's prox to distal. LE's: 3+HF, 4/5 ke, adf/apf. No sensory loss. Good sitting balance, dtr's 1+.  Skin: Skin is warm and dry.  Psych:   cooperative   Assessment/Plan: 1. Functional deficits secondary to deconditioning after bilateral pneumonia---complicated by HONK which require 3+ hours per day of interdisciplinary therapy in a comprehensive inpatient  rehab setting. Physiatrist is providing close team supervision and 24 hour management of active medical problems listed below. Physiatrist and rehab team continue to assess barriers to discharge/monitor patient progress toward functional and medical goals. FIM: FIM - Bathing Bathing Steps Patient Completed: Chest;Right upper leg;Right Arm;Left upper leg;Left Arm;Right lower leg (including foot);Abdomen;Left lower leg (including foot);Front perineal area;Buttocks Bathing: 4: Steadying assist  FIM - Upper Body Dressing/Undressing Upper body dressing/undressing steps patient completed: Thread/unthread right sleeve of pullover shirt/dresss;Put head through opening of pull over shirt/dress;Thread/unthread left sleeve of pullover shirt/dress;Pull shirt over trunk Upper body dressing/undressing: 5: Set-up assist to: Obtain clothing/put away FIM - Lower Body Dressing/Undressing Lower body dressing/undressing steps patient completed: Thread/unthread left pants leg;Don/Doff left sock;Pull pants up/down;Fasten/unfasten pants;Don/Doff right sock;Thread/unthread right pants leg;Pull underwear up/down;Thread/unthread left underwear leg;Thread/unthread right underwear leg Lower body dressing/undressing: 5: Supervision: Safety issues/verbal cues  FIM - Toileting Toileting steps completed by patient: Adjust clothing prior to toileting;Performs perineal hygiene;Adjust clothing after toileting Toileting Assistive Devices: Grab bar or rail for support Toileting: 5: Supervision: Safety issues/verbal cues  FIM - Diplomatic Services operational officer Devices: Grab bars;Walker Toilet Transfers: 5-To toilet/BSC: Supervision (verbal cues/safety issues);5-From toilet/BSC: Supervision (verbal cues/safety issues)  FIM - Press photographer Assistive Devices: Arm rests Bed/Chair Transfer: 4: Chair or W/C > Bed: Min A (steadying Pt. > 75%)  FIM - Locomotion: Wheelchair Distance: 75 Locomotion:  Wheelchair: 2: Travels 50 - 149 ft with supervision, cueing or coaxing FIM -  Locomotion: Ambulation Locomotion: Ambulation Assistive Devices: Emergency planning/management officerCane - Straight Ambulation/Gait Assistance: 5: Supervision Locomotion: Ambulation: 5: Travels 150 ft or more with supervision/safety issues  Comprehension Comprehension Mode: Auditory Comprehension: 5-Understands complex 90% of the time/Cues < 10% of the time  Expression Expression Mode: Verbal Expression: 6-Expresses complex ideas: With extra time/assistive device  Social Interaction Social Interaction: 6-Interacts appropriately with others with medication or extra time (anti-anxiety, antidepressant).  Problem Solving Problem Solving: 6-Solves complex problems: With extra time  Memory Memory: 6-More than reasonable amt of time Medical Problem List and Plan:  1. Functional deficits secondary to Deconditioning complicated by bilateral PNA and hyperosmotic non-ketotic coma  2. DVT Prophylaxis/Anticoagulation: Pharmaceutical: Lovenox  3. Pain Management: N/A  4. Mood: LCSW to follow for evaluation and support.  5. Neuropsych: This patient is capable of making decisions on her own behalf.  6. Skin/Wound Care: Routine pressure relief measures.  7. Fluids/Electrolytes/Nutrition:Monitor I/O. Encourage fluid intake.   9. DM type 2: HGB A1c-7.5. Was on Kombiglyze XR at home. Was Started on Lantus with SSI for elevated BS.  resumed sitagliptin and amaryl.  wean off lantus. Avoid metformin   -observe for pattern today 10. Anemia: likely of chronic disease/stress state/with blood loss: Has low iron stores with elevated ferritin. Reporting rectal bleeding due to hems. Has history of heme positive stools with long standing constipation--negative colonoscopy by Dr. Loreta AveMann 05/2003   -pt denies any issues  -hgb has ranged from 7.8 to 9.1 during this stay. Now down to 7.5 today  -consider GI follow up if active bleeding seen  -daily hgb  -fe+ studies  tomorrow  -heme check stool 11. Acute renal failure: Has resolved with IV fluids as well as medication adjustment. Continue to monitor.  12. Hypokalemia:continue to supplement  13. B-CAP: IV antibiotics d/c. Continue Levaquin D# 4/5 to complete antibiotic regimen.  14. HTN: Continue to hold HCTZ for now. lisinopriil resumed. Renal status improving   LOS (Days) 3 A FACE TO FACE EVALUATION WAS PERFORMED  Ragena Fiola T 08/19/2014 7:23 AM

## 2014-08-19 NOTE — Progress Notes (Signed)
Occupational Therapy Session Note  Patient Details  Name: Governor SpeckingBarbara P Brundidge MRN: [id number] Date of Birth: June 03, 1946  Today's Date: 08/19/2014 OT Individual Time: 1120-1205 OT Individual Time Calculation (min): 45 min   Skilled Therapeutic Interventions/Progress Updates: Patient elected to bathe and dress sitting on toilet this morning.  She completed bathing & dressing with overall distant S.  Focus was on functional mobility, dynamic standing balance and lower dressing.    Therapy Documentation Precautions:  Precautions Precautions: Fall Restrictions Weight Bearing Restrictions: No  Pain: denied    See FIM for current functional status  Therapy/Group: Individual Therapy  Rozelle Loganickett, Doni Bacha Yeary 08/19/2014, 3:21 PM

## 2014-08-20 ENCOUNTER — Inpatient Hospital Stay (HOSPITAL_COMMUNITY): Payer: Medicare Other | Admitting: *Deleted

## 2014-08-20 ENCOUNTER — Inpatient Hospital Stay (HOSPITAL_COMMUNITY): Payer: Medicare Other

## 2014-08-20 LAB — GLUCOSE, CAPILLARY
GLUCOSE-CAPILLARY: 130 mg/dL — AB (ref 70–99)
GLUCOSE-CAPILLARY: 175 mg/dL — AB (ref 70–99)
Glucose-Capillary: 158 mg/dL — ABNORMAL HIGH (ref 70–99)
Glucose-Capillary: 157 mg/dL — ABNORMAL HIGH (ref 70–99)

## 2014-08-20 LAB — HEMOGLOBIN AND HEMATOCRIT, BLOOD
HEMATOCRIT: 21.9 % — AB (ref 36.0–46.0)
HEMOGLOBIN: 7.5 g/dL — AB (ref 12.0–15.0)

## 2014-08-20 LAB — CULTURE, BLOOD (ROUTINE X 2): Culture: NO GROWTH

## 2014-08-20 MED ORDER — GLIMEPIRIDE 4 MG PO TABS
4.0000 mg | ORAL_TABLET | Freq: Every day | ORAL | Status: DC
  Administered 2014-08-20 – 2014-08-23 (×4): 4 mg via ORAL
  Filled 2014-08-20 (×5): qty 1

## 2014-08-20 NOTE — Care Management Note (Signed)
[facility]patient Rehabilitation [facility] Individual Statement of Services  Patient Name:  Governor SpeckingBarbara P Morell  Date:  08/17/2014  Welcome to the Inpatient Rehabilitation [facility].  Our goal is to provide you with an individualized program based on your diagnosis and situation, designed to meet your specific needs.  With this comprehensive rehabilitation program, you will be expected to participate in at least 3 hours of rehabilitation therapies Monday-Friday, with modified therapy programming on the weekends.  Your rehabilitation program will include the following services:  Physical Therapy (PT), Occupational Therapy (OT), 24 hour per day rehabilitation nursing, Therapeutic Recreaction (TR), Case Management (Social Worker), Rehabilitation Medicine, Nutrition Services and Pharmacy Services  Weekly team conferences will be held on Tuesdays to discuss your progress.  Your Social Worker will talk with you frequently to get your input and to update you on team discussions.  Team conferences with you and your family in attendance may also be held.  Expected length of stay: 7-10 days  Overall anticipated outcome: modified independent  Depending on your progress and recovery, your program may change. Your Social Worker will coordinate services and will keep you informed of any changes. Your Social Worker's name and contact numbers are listed  below.  The following services may also be recommended but are not provided by the Inpatient Rehabilitation Center:   Driving Evaluations  Home Health Rehabiltiation Services  Outpatient Rehabilitation Services  Vocational Rehabilitation   Arrangements will be made to provide these services after discharge if needed.  Arrangements include referral to agencies that provide these services.  Your insurance has been verified to be:  Medicare Your primary doctor is:  Dorothyann PengRobyn Sanders  Pertinent information will be shared with your doctor and your insurance company.  Social  Worker:  [place] CityLucy Kyrianna Barletta, [place] [phone] or [phone]) [phone]   Information discussed with and copy given to patient by: Amada JupiterHOYLE, Johana Hopkinson, 08/20/2014, 2:09 PM

## 2014-08-20 NOTE — Evaluation (Signed)
Speech Language Pathology Assessment and Plan  Patient Details  Name: Catherine [name] MRN: [id number] Date of Birth: 04-Oct-1946  SLP Diagnosis:  n/a Rehab Potential:  defer to OT/PT ELOS:  defer to OT/PT   Today's Date: 08/20/2014 SLP Individual Time: 1305-1405 SLP Individual Time Calculation (min): 60 min   Problem List:  Patient Active Problem List   Diagnosis Date Noted  . Physical debility 08/16/2014  . Pneumonia 08/13/2014  . DKA, type 2 08/13/2014  . Acute renal failure 08/13/2014  . Hypernatremia 08/13/2014  . Elevated LFTs 08/13/2014  . Unspecified hypothyroidism 08/13/2014  . DKA (diabetic ketoacidoses) 08/13/2014   Past Medical History:  Past Medical History  Diagnosis Date  . Diabetes mellitus without complication   . Hypertension   . Thyroid disease    Past Surgical History:  Past Surgical History  Procedure Laterality Date  . Cholecystectomy      Assessment / Plan / Recommendation Clinical Impression Ms. Catherine [name] is a 68 year old female with history of DM type 2, HTN, hypothyroid who was admitted on 08/13/14 with one week history of weakness with difficulty walking as well as 2 day history of productive cough with lethargy and slurred speech. She was found to have bilateral CAP with acute renal failure with BUN/Cr- 83/2.0, BS - 514 and CK 4435. She was started on IV antibiotics, gentle hydration as well as IV insulin. Lisinopril and Diuretics d/c. UDS negative. Lactate normal. Patient with abnormal LFTs and Ammonia levels elevated at 89. TSH-1.090. MRI brain without acute abnormality and mild chronic small vessel disease. Lethargy has resolved but patient continue to have delayed processing with difficulty following simple commands. Swallow evaluation with mild oral holding and patient placed on dysphagia 3, thin liquids. She continues to have cough and follow up CXR revealed worsening aeration RUL with multifocal PNA. The patient was screened by the  inpatient rehab team, and ultimately she was admitted to [facility] 08/16/14.  Orders received for cognitive-linguistic evaluation which was completed. Patient demonstrates [facility] speech and language abilities.  She reported that cognitively she required more time to complete such things as calculations and visuospatial tasks; however, she was Mod I for self-monitoring and correcting errors.  She utilized external aids with Mod I to recall new information and her nurse was asked to provide her with a handout so that she may use it to recall her current medications.  As a result, patient does not require skilled SLP services at this time.  Defer length of stay to OT and PT.    Skilled Therapeutic Interventions          Cognitive-linguistic evaluation completed with results and recommendations reviewed with patient.  SLP also facilitated session with therapeutic discussion regarding the importance of reestablishing routine and daily activities gradually upon discharge as well as allowing family to assist as needed.    SLP Assessment  Patient does not need any further Speech Lanaguage Pathology Services    Recommendations   n/a              Pain Pain Assessment Pain Assessment: No/denies pain Prior Functioning Cognitive/Linguistic Baseline: Within functional limits Type of Home: House  Lives With: Spouse;Son Available Help at Discharge: Family;Available 24 hours/day Education: completed high school Vocation: Full time employment  See FIM for current functional status  Recommendations for other services: None  Discharge Criteria: Patient will be discharged from SLP if patient refuses treatment 3 consecutive times without medical reason, if treatment goals not met, if  there is a change in medical status, if patient makes no progress towards goals or if patient is discharged from hospital.  The above assessment, treatment plan, treatment alternatives and goals were discussed and mutually agreed upon: by  patient  Catherine [name], M.A., CCC-SLP Lane 08/20/2014, 2:25 PM

## 2014-08-20 NOTE — Progress Notes (Signed)
Physical Therapy Session Note  Patient Details  Name: Catherine SpeckingBarbara P Servantes MRN: [id number] Date of Birth: 01/28/1946  Today's Date: 08/20/2014 PT Individual Time: 1000-1100 PT Individual Time Calculation (min): 60 min   Short Term Goals: Week 1:  PT Short Term Goal 1 (Week 1): STG's=LTG's secondary to anticipated LOS.  Skilled Therapeutic Interventions/Progress Updates:    AM Session: Patient received sitting in recliner. Session focused on gait training, stair negotiation, and high level balance. See details below for outcome measures performed (TUG and DGI).  Patient performed functional ambulation around unit, several bouts >200' [facility] and close supervision, including furniture transfers in family room. Patient requires verbal cues to look ahead and for increased gait speed, demonstrates good sequencing with SPC. Stair negotiation of 3 steps, step to pattern, no rails with use of SPC and minA to simulate STE home, verbal and tactile cues for sequencing. Stair negotiation of 3 steps during administration of DGI with R handrail, step to pattern, and minA.  High level balance activities: obstacle course x2 requiring patient to step over various obstacles, step across foam, and pick objects up off floor; patient performed with SPC and min guard to close supervision overall. Ambulation with R HHA only while kicking yoga block to facilitate lateral weight shifts and increased SLS time for balance challenge, x150' while dual tasking with rec therapist asking questions about recreation/leisure activities PTA. Gait training without AD or HHA >150' back to room, noted decreased gait speed, but patient without LOB, continues to require cues for looking ahead. Patient returned to room and left sitting in recliner with rec therapist, all needs within reach.  Therapy Documentation Precautions:  Precautions Precautions: Fall Restrictions Weight Bearing Restrictions: No General:   Pain: Pain  Assessment Pain Assessment: No/denies pain Pain Score: 0-No pain Locomotion : Ambulation Ambulation/Gait Assistance: 5: Supervision  Balance: Balance Balance Assessed: Yes Standardized Balance Assessment Standardized Balance Assessment: Dynamic Gait Index;Timed Up and Go Test Dynamic Gait Index Level Surface: Mild Impairment Change in Gait Speed: Mild Impairment Gait with Horizontal Head Turns: Moderate Impairment Gait with Vertical Head Turns: Moderate Impairment Gait and Pivot Turn: Mild Impairment Step Over Obstacle: Mild Impairment Step Around Obstacles: Mild Impairment Steps: Moderate Impairment Total Score: 13/22, score <19 indicates patient is at risk for falls Timed Up and Go Test TUG: Normal TUG Normal TUG (seconds): 20.23 (average of three trials); Score >13" indicates patient is at risk for falls  See FIM for current functional status  Therapy/Group: Individual Therapy  Chipper HerbBridget S Donal Lynam S. Keelie Zemanek, PT, DPT 08/20/2014, 11:16 AM

## 2014-08-20 NOTE — Plan of Care (Signed)
Problem: RH SAFETY Goal: RH STG ADHERE TO SAFETY PRECAUTIONS W/ASSISTANCE/DEVICE STG Adhere to Safety Precautions With Assistance/Device. Supervision Outcome: Progressing No unsafe behavior noted this shift

## 2014-08-20 NOTE — Progress Notes (Signed)
[facility] PHYSICAL MEDICINE & REHABILITATION     PROGRESS NOTE    Subjective/Complaints: No complaints. Reports small amount of blood in stool.  Good appetite  Objective: Vital Signs: Blood pressure 121/48, pulse 68, temperature 98.1 F (36.7 C), temperature source Oral, resp. rate 18, height 5\' 6"  (1.676 m), weight 82.2 kg (181 lb 3.5 oz), SpO2 95.00%. No results found.  Recent Labs  08/18/14 0615 08/19/14 0455  HGB 7.9* 7.5*  HCT 22.8* 21.9*   No results found for this basename: NA, K, CL, CO, GLUCOSE, BUN, CREATININE, CALCIUM,  in the last 72 hours CBG (last 3)   Recent Labs  08/19/14 1622 08/19/14 2037 08/20/14 0638  GLUCAP 212* 237* 158*    Wt Readings from Last 3 Encounters:  08/19/14 82.2 kg (181 lb 3.5 oz)  08/16/14 75 kg (165 lb 5.5 oz)    Physical Exam:  Constitutional: She appears well-developed.  HENT: oral mucosa moist, pink  Head: Normocephalic.  Eyes: EOM are normal.  Neck: Normal range of motion. Neck supple. No thyromegaly present.  Cardiovascular: Normal rate and regular rhythm. No murmurs, rubs, gallops  Respiratory:  Decreased breath sounds at the bases but clear to auscultation. No wheezes, rales, rhonchi  GI: Soft. Bowel sounds are normal. She exhibits no distension.  Neurological:  Pt is alert/oriented to name,hospital. Follows simple commands. slightly irritable. Comprehends  basic info. Strength 4/5 UE's prox to distal. LE's: 3+HF, 4/5 ke, adf/apf. No sensory loss. Good sitting balance, dtr's 1+.  Skin: Skin is warm and dry.  Psych:   cooperative   Assessment/Plan: 1. Functional deficits secondary to deconditioning after bilateral pneumonia---complicated by HONK which require 3+ hours per day of interdisciplinary therapy in a comprehensive inpatient rehab setting. Physiatrist is providing close team supervision and 24 hour management of active medical problems listed below. Physiatrist and rehab team continue to assess barriers to  discharge/monitor patient progress toward functional and medical goals. FIM: FIM - Bathing Bathing Steps Patient Completed: Chest;Right Arm;Left Arm;Abdomen;Front perineal area;Buttocks Bathing: 5: Supervision: Safety issues/verbal cues  FIM - Upper Body Dressing/Undressing Upper body dressing/undressing steps patient completed: Thread/unthread right sleeve of pullover shirt/dresss;Thread/unthread left sleeve of pullover shirt/dress;Pull shirt over trunk;Put head through opening of pull over shirt/dress Upper body dressing/undressing: 5: Set-up assist to: Obtain clothing/put away FIM - Lower Body Dressing/Undressing Lower body dressing/undressing steps patient completed: Thread/unthread right underwear leg;Thread/unthread left pants leg;Don/Doff left sock;Thread/unthread left underwear leg;Pull pants up/down;Pull underwear up/down;Fasten/unfasten pants;Thread/unthread right pants leg;Don/Doff right sock Lower body dressing/undressing: 5: Set-up assist to: Obtain clothing  FIM - Toileting Toileting steps completed by patient: Adjust clothing prior to toileting;Performs perineal hygiene;Adjust clothing after toileting Toileting Assistive Devices: Grab bar or rail for support Toileting: 5: Supervision: Safety issues/verbal cues  FIM - Diplomatic Services operational officerToilet Transfers Toilet Transfers Assistive Devices: Grab bars Toilet Transfers: 6-More than reasonable amt of time;5-To toilet/BSC: Supervision (verbal cues/safety issues);5-From toilet/BSC: Supervision (verbal cues/safety issues)  FIM - Press photographerBed/Chair Transfer Bed/Chair Transfer Assistive Devices: Arm rests Bed/Chair Transfer: 4: Bed > Chair or W/C: Min A (steadying Pt. > 75%)  FIM - Locomotion: Wheelchair Distance: 75 Locomotion: Wheelchair: 2: Travels 50 - 149 ft with supervision, cueing or coaxing FIM - Locomotion: Ambulation Locomotion: Ambulation Assistive Devices: Emergency planning/management officerCane - Straight Ambulation/Gait Assistance: 5: Supervision Locomotion: Ambulation: 5:  Travels 150 ft or more with supervision/safety issues  Comprehension Comprehension Mode: Auditory Comprehension: 5-Understands complex 90% of the time/Cues < 10% of the time  Expression Expression Mode: Verbal Expression: 6-Expresses complex ideas: With extra time/assistive  device  Social Interaction Social Interaction: 6-Interacts appropriately with others with medication or extra time (anti-anxiety, antidepressant).  Problem Solving Problem Solving: 6-Solves complex problems: With extra time  Memory Memory: 6-More than reasonable amt of time Medical Problem List and Plan:  1. Functional deficits secondary to Deconditioning complicated by bilateral PNA and hyperosmotic non-ketotic coma  2. DVT Prophylaxis/Anticoagulation: Pharmaceutical: Lovenox  3. Pain Management: N/A  4. Mood: LCSW to follow for evaluation and support.  5. Neuropsych: This patient is capable of making decisions on her own behalf.  6. Skin/Wound Care: Routine pressure relief measures.  7. Fluids/Electrolytes/Nutrition:Monitor I/O. Encourage fluid intake.   9. DM type 2: HGB A1c-7.5. Was on Kombiglyze XR at home. Was Started on Lantus with SSI for elevated BS.  resumed sitagliptin and amaryl.   off lantus. Avoid metformin   -increase amaryl to 4mg  10. Anemia: likely of chronic disease/stress state/with blood loss: Has low iron stores with elevated ferritin. Reporting rectal bleeding due to hems. Has history of heme positive stools with long standing constipation--negative colonoscopy by Dr. Loreta Ave 05/2003   -pt denies any issues  -hgb has ranged from 7.8 to 9.1 during this stay.   down to 7.5 most recently---needs hgb today  -consider GI follow up if Hgb dips further  -daily hgb  -fe+ studies show increased ferritin, Fe++ nl, normocytic  -heme check stool  11. Acute renal failure: Has resolved with IV fluids as well as medication adjustment. Continue to monitor.  12. Hypokalemia:continue to supplement  13. B-CAP:   levaquin completed.  14. HTN: Continue to hold HCTZ for now. lisinopriil resumed. Renal status improving   LOS (Days) 4 A FACE TO FACE EVALUATION WAS PERFORMED  Wyeth Hoffer T 08/20/2014 7:20 AM

## 2014-08-20 NOTE — Progress Notes (Signed)
Occupational Therapy Session Note  Patient Details  Name: Governor SpeckingBarbara P Konz MRN: [id number] Date of Birth: 14-Apr-1946  Today's Date: 08/20/2014 OT Individual Time: 0830-0930 OT Individual Time Calculation (min): 60 min    Short Term Goals: Week 1:  OT Short Term Goal 1 (Week 1): STGs=LTGs d/t short ELOS  Skilled Therapeutic Interventions/Progress Updates:    Pt seen for ADL retraining with focus on functional mobility, activity tolerance, safety awareness, and standing balance. Pt received sitting EOB. Ambulated into bathroom at supervision level using [place] to complete toilet transfer and task and shower transfer. Completed bathing at supervision level for standing balance as she stood ~3 min with mod cues to not use grab bars to simulate home environment. Completed dressing sitting EOB with increased time d/t rest breaks. Ambulated to therapy gym with [place] at supervision level and cues for keeping head up to increase awareness of surroundings. Engaged in dynamic standing balance task of tossing ball at rebounder at supervision level and no LOB noted. Pt returned to room and left in recliner chair with all needs in reach.   Therapy Documentation Precautions:  Precautions Precautions: Fall Restrictions Weight Bearing Restrictions: No General:   Vital Signs:   Pain: Pain Assessment Pain Assessment: No/denies pain Pain Score: 0-No pain  See FIM for current functional status  Therapy/Group: Individual Therapy  Daneil Danerkinson, Jery Hollern N 08/20/2014, 10:44 AM

## 2014-08-20 NOTE — Progress Notes (Signed)
Recreational Therapy Assessment and Plan  Patient Details  Name: Catherine [name] MRN: [id number] Date of Birth: 1945/12/17 Today's Date: 08/20/2014  Rehab Potential: Good ELOS:  7 days   Assessment Clinical Impression: Problem List:  Patient Active Problem List    Diagnosis  Date Noted   .  Physical debility  08/16/2014   .  Pneumonia  08/13/2014   .  DKA, type 2  08/13/2014   .  Acute renal failure  08/13/2014   .  Hypernatremia  08/13/2014   .  Elevated LFTs  08/13/2014   .  Unspecified hypothyroidism  08/13/2014   .  DKA (diabetic ketoacidoses)  08/13/2014    Past Medical History:  Past Medical History   Diagnosis  Date   .  Diabetes mellitus without complication    .  Hypertension    .  Thyroid disease     Past Surgical History:  Past Surgical History   Procedure  Laterality  Date   .  Cholecystectomy      Assessment & Plan  Clinical Impression: Catherine [name] is a 68 year old female with history of DM type 2, HTN, hypothyroid who was admitted on 08/13/14 with one week history of weakness with difficulty walking as well as 2 day history of productive cough with lethargy and slurred speech. She was found to have bilateral CAP with acute renal failure with BUN/Cr- 83/2.0, BS - 514 and CK 4435. She was started on IV antibiotics, gentle hydration as well as as IV insulin. Lisinopril and Diuretics d/c. UDS negative. Lactate normal. Patient with abnormal LFTs and Ammonia levels elevated at 89. TSH-1.090. MRI brain without acute abnormality and mild chronic small vessel disease. Lethargy has resolved but patient continue to have delayed processing with difficulty following simple commands. Swallow evaluation with mild oral holding and patient placed on dysphagia 3, thin liquids. She continues ot have cough and follow up CXR revealed worsening aeration RUL with multifocal PNA.Patient transferred to CIR on 08/16/2014.   Pt presents with decreased activity tolerance, decreased  functional mobility, decreased balance, decreased safety Limiting pt's independence with leisure/community pursuits.   Leisure History/Participation Premorbid leisure interest/current participation: Medical laboratory scientific officer - Doctor, hospital - Production designer, theatre/television/film Expression Interests: Music (Comment) Other Leisure Interests: Cooking/Baking Leisure Participation Style: Alone;With Family/Friends Awareness of Community Resources: Good-identify 3 post discharge leisure resources Psychosocial / Spiritual Spiritual Interests: [place]: Cooperative Academic librarian Appropriate for Education?: Yes Patient Agreeable to [employer]?: Yes Recreational Therapy Orientation Orientation -Reviewed with patient: Available activity resources Strengths/Weaknesses Patient Strengths/Abilities: Willingness to participate Patient weaknesses: Physical limitations TR Patient demonstrates impairments in the following area(s): Endurance;Motor  Plan Rec Therapy Plan Is patient appropriate for Therapeutic Recreation?: Yes Rehab Potential: Good Treatment times per week: Min 1 time >20 minutes for community reintegration education/training Estimated Length of Stay:  7 days TR Treatment/Interventions: Adaptive equipment instruction;1:1 session;Balance/vestibular training;Functional mobility training;Community reintegration;Patient/family education;Group participation (Comment);Recreation/leisure participation;Therapeutic activities;Therapeutic exercise  Recommendations for other services: None  Discharge Criteria: Patient will be discharged from TR if patient refuses treatment 3 consecutive times without medical reason.  If treatment goals not met, if there is a change in medical status, if patient makes no progress towards goals or if patient is discharged from hospital.  The above assessment, treatment plan, treatment alternatives and goals were discussed and mutually agreed upon: by  patient  [place] 08/20/2014, 1:41 PM

## 2014-08-20 NOTE — Progress Notes (Signed)
Social Work  Social Work Assessment and Plan  Patient Details  Name: Catherine [name] MRN: [id number] Date of Birth: Feb 18, 1946  Today's Date: 08/17/2014  Problem List:  Patient Active Problem List   Diagnosis Date Noted  . Physical debility 08/16/2014  . Pneumonia 08/13/2014  . DKA, type 2 08/13/2014  . Acute renal failure 08/13/2014  . Hypernatremia 08/13/2014  . Elevated LFTs 08/13/2014  . Unspecified hypothyroidism 08/13/2014  . DKA (diabetic ketoacidoses) 08/13/2014   Past Medical History:  Past Medical History  Diagnosis Date  . Diabetes mellitus without complication   . Hypertension   . Thyroid disease    Past Surgical History:  Past Surgical History  Procedure Laterality Date  . Cholecystectomy     Social History:  reports that she has never smoked. She does not have any smokeless tobacco history on file. She reports that she does not drink alcohol or use illicit drugs.  Family / Support Systems Marital Status: Married Patient Roles: Spouse;Parent (Has a husband and a 67 yo son.) Spouse/Significant Other: husband, [name] Children: two adult sons with one living at home with pt/ husband (ages [phone] and [phone]) Anticipated Caregiver: husband Ability/Limitations of Caregiver: Husband is retired and can assist.  Son in home and works flexible hours Caregiver Availability: 24/7 Family Dynamics: good support from family - no concerns.  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  No s/s of depression or anxiety.  Husband at bedside and very supportive. Recent Psychosocial Issues: None Pyschiatric History: None Substance Abuse History: None  Patient / Family Perceptions, Expectations & Goals Pt/Family understanding of illness & functional limitations: pt and husband with good understanding of pt's medical issues and current state of deconditioning/ need for CIR Premorbid pt/family roles/activities: Pt was independent and active PTA - no limitations. Anticipated changes in roles/activities/participation: little long term change anticipated if able to reach mod i goals. Pt/family expectations/goals: "i just want to to get my strength back."  Community Resources Premorbid Home Care/DME Agencies: None Transportation available at discharge: yes  Discharge Planning Living Arrangements: Spouse/significant other;Children Support Systems: Children;Spouse/significant other Type of Residence: Private residence Insurance Resources: AdministratorMedicare;Private Insurance (specify) (BCBS [employer]) Architectinancial Resources: Social Security;Employment Living Expenses: Database administratorMotgage Money Management: Spouse Does the patient have any problems obtaining your medications?: No Home Management: shared with family Patient/Family Preliminary Plans: pt plans to return home with husband and adult son Social Work Anticipated Follow Up Needs: HH/OP Expected length of stay: 7 days days  Clinical Impression Very pleasant woman here following pneumonia and deconditioning.  Husband at bedside and very supportive.  Anticipate short LOS.  No s/s of depression/ anxiety.  Will follow for d/c planning needs.  [name] 08/17/2014, 2:29 PM

## 2014-08-21 ENCOUNTER — Inpatient Hospital Stay (HOSPITAL_COMMUNITY): Payer: Medicare Other

## 2014-08-21 ENCOUNTER — Inpatient Hospital Stay (HOSPITAL_COMMUNITY): Payer: Medicare Other | Admitting: *Deleted

## 2014-08-21 LAB — GLUCOSE, CAPILLARY
GLUCOSE-CAPILLARY: 170 mg/dL — AB (ref 70–99)
GLUCOSE-CAPILLARY: 129 mg/dL — AB (ref 70–99)
Glucose-Capillary: 161 mg/dL — ABNORMAL HIGH (ref 70–99)
Glucose-Capillary: 147 mg/dL — ABNORMAL HIGH (ref 70–99)

## 2014-08-21 LAB — CULTURE, BLOOD (ROUTINE X 2)
CULTURE: NO GROWTH
CULTURE: NO GROWTH

## 2014-08-21 LAB — HEMOGLOBIN AND HEMATOCRIT, BLOOD
HEMATOCRIT: 25.5 % — AB (ref 36.0–46.0)
Hemoglobin: 8.7 g/dL — ABNORMAL LOW (ref 12.0–15.0)

## 2014-08-21 NOTE — Progress Notes (Signed)
Occupational Therapy Session Note  Patient Details  Name: Catherine [name] MRN: [id number] Date of Birth: 1946/04/03  Today's Date: 08/21/2014 OT Individual Time: 8:30- 9:30 OT Individual Time Calculation (min): 60 min   Short Term Goals: Week 1:  OT Short Term Goal 1 (Week 1): STGs=LTGs d/t short ELOS  Skilled Therapeutic Interventions/Progress Updates:  Pt supine in bed and asleep when arriving, arousing easily. Pt w/ no reports of pain. Pt t/f to EOB, doffed socks, and ambulated to bathroom using cane. Pt t/f to toilet, doffed pants and shirt, and ambulated to TTB. Pt completed UB/LB bathing on TTB, performing sit<>stand t/f 2x. Pt ambulated to room, t/f to recliner, and completed UB/LB dressing and therapist applied ted hose. Pt stood and ambulated to sink, performed tooth brushing and hand washing while standing. Pt donned jacket and ambulated using cane to down hallway to elevator and then to gift shop on 1st floor. Pt educated on and demonstrated safe navigation between aisles, simulating community situations. Pt ambulated to elevator, down hallway on 4th floor, and to day room and watered plants while standing, performing side-stepping. Pt ambulated down hallway to room, taking medications at nurses station. Pt demonstrating difficulty w/ navigating to room and remembering room number. Pt seated in recliner w/ all needs nearby when leaving.   Therapy Documentation Precautions:  Precautions Precautions: Fall Restrictions Weight Bearing Restrictions: No  See FIM for current functional status  Therapy/Group: Individual Therapy  Berdene Askari Raynell 08/21/2014, 10:41 AM

## 2014-08-21 NOTE — Progress Notes (Signed)
Physical Therapy Note  Patient Details  Name: Catherine [name] MRN: [id number] Date of Birth: 07-21-1946 Today's Date: 08/21/2014  Attempted 15 min make up session, unable to complete; will follow up when able.   [name], PT, DPT 08/21/2014, 2:54 PM

## 2014-08-21 NOTE — Progress Notes (Signed)
Note reviewed and therapist agrees with the information provided.  

## 2014-08-21 NOTE — Progress Notes (Signed)
[place] PHYSICAL MEDICINE & REHABILITATION     PROGRESS NOTE    Subjective/Complaints: Had a good night. Anxious to get home. Denies pain. No bleeding per rectum Good appetite  Objective: Vital Signs: Blood pressure 111/59, pulse 72, temperature 98.1 F (36.7 C), temperature source Oral, resp. rate 18, height 5\' 6"  (1.676 m), weight 82.6 kg (182 lb 1.6 oz), SpO2 97.00%. No results found.  Recent Labs  08/19/14 0455 08/20/14 0628  HGB 7.5* 7.5*  HCT 21.9* 21.9*   No results found for this basename: NA, K, CL, CO, GLUCOSE, BUN, CREATININE, CALCIUM,  in the last 72 hours CBG (last 3)   Recent Labs  08/20/14 1639 08/20/14 2040 08/21/14 0639  GLUCAP 130* 175* 161*    Wt Readings from Last 3 Encounters:  08/21/14 82.6 kg (182 lb 1.6 oz)  08/16/14 75 kg (165 lb 5.5 oz)    Physical Exam:  Constitutional: She appears well-developed.  HENT: oral mucosa moist, pink  Head: Normocephalic.  Eyes: EOM are normal.  Neck: Normal range of motion. Neck supple. No thyromegaly present.  Cardiovascular: Normal rate and regular rhythm. No murmurs, rubs, gallops  Respiratory:  Decreased breath sounds at the bases but clear to auscultation. No wheezes, rales, rhonchi  GI: Soft. Bowel sounds are normal. She exhibits no distension.  Neurological:  Pt is alert/oriented to name,hospital. Follows simple commands. slightly irritable. Comprehends  basic info. Strength 4/5 UE's prox to distal. LE's: 3+HF, 4/5 ke, adf/apf. No sensory loss. Good sitting balance, dtr's 1+.  Skin: Skin is warm and dry.  Psych:   cooperative   Assessment/Plan: 1. Functional deficits secondary to deconditioning after bilateral pneumonia---complicated by HONK which require 3+ hours per day of interdisciplinary therapy in a comprehensive inpatient rehab setting. Physiatrist is providing close team supervision and 24 hour management of active medical problems listed below. Physiatrist and rehab team continue to  assess barriers to discharge/monitor patient progress toward functional and medical goals. FIM: FIM - Bathing Bathing Steps Patient Completed: Chest;Right Arm;Left Arm;Abdomen;Front perineal area;Buttocks;Right upper leg;Left lower leg (including foot);Right lower leg (including foot);Left upper leg Bathing: 5: Supervision: Safety issues/verbal cues  FIM - Upper Body Dressing/Undressing Upper body dressing/undressing steps patient completed: Thread/unthread right sleeve of pullover shirt/dresss;Thread/unthread left sleeve of pullover shirt/dress;Pull shirt over trunk;Put head through opening of pull over shirt/dress;Hook/unhook bra;Thread/unthread left bra strap;Thread/unthread right bra strap Upper body dressing/undressing: 5: Set-up assist to: Obtain clothing/put away FIM - Lower Body Dressing/Undressing Lower body dressing/undressing steps patient completed: Thread/unthread right underwear leg;Thread/unthread left pants leg;Thread/unthread left underwear leg;Pull pants up/down;Pull underwear up/down;Fasten/unfasten pants;Thread/unthread right pants leg;Don/Doff left shoe;Don/Doff right shoe;Fasten/unfasten right shoe;Fasten/unfasten left shoe Lower body dressing/undressing: 5: Set-up assist to: Don/Doff TED stocking  FIM - Toileting Toileting steps completed by patient: Performs perineal hygiene;Adjust clothing after toileting;Adjust clothing prior to toileting Toileting Assistive Devices: Grab bar or rail for support Toileting: 5: Supervision: Safety issues/verbal cues  FIM - Diplomatic Services operational officerToilet Transfers Toilet Transfers Assistive Devices: Grab bars Toilet Transfers: 5-From toilet/BSC: Supervision (verbal cues/safety issues);5-To toilet/BSC: Supervision (verbal cues/safety issues)  FIM - Press photographerBed/Chair Transfer Bed/Chair Transfer Assistive Devices: Arm rests;Cane Bed/Chair Transfer: 5: Bed > Chair or W/C: Supervision (verbal cues/safety issues);5: Chair or W/C > Bed: Supervision (verbal cues/safety  issues)  FIM - Locomotion: Wheelchair Distance: 75 Locomotion: Wheelchair: 0: Activity did not occur (patient ambulatory) FIM - Locomotion: Ambulation Locomotion: Ambulation Assistive Devices: Emergency planning/management officerCane - Straight Ambulation/Gait Assistance: 5: Supervision Locomotion: Ambulation: 5: Travels 150 ft or more with supervision/safety issues  Comprehension Comprehension  Mode: Auditory Comprehension: 5-Understands basic 90% of the time/requires cueing < 10% of the time  Expression Expression Mode: Verbal Expression: 6-Expresses complex ideas: With extra time/assistive device  Social Interaction Social Interaction: 5-Interacts appropriately 90% of the time - Needs monitoring or encouragement for participation or interaction.  Problem Solving Problem Solving: 6-Solves complex problems: With extra time  Memory Memory: 5-Requires cues to use assistive device Medical Problem List and Plan:  1. Functional deficits secondary to Deconditioning complicated by bilateral PNA and hyperosmotic non-ketotic coma  2. DVT Prophylaxis/Anticoagulation: Pharmaceutical: Lovenox  3. Pain Management: N/A  4. Mood: LCSW to follow for evaluation and support.  5. Neuropsych: This patient is capable of making decisions on her own behalf.  6. Skin/Wound Care: Routine pressure relief measures.  7. Fluids/Electrolytes/Nutrition:Monitor I/O. Encourage fluid intake.   9. DM type 2: HGB A1c-7.5. Was on Kombiglyze XR at home. Was Started on Lantus with SSI for elevated BS.  resumed sitagliptin and amaryl.   off lantus. Avoid metformin   -increased amaryl to 4mg ---improving control 10. Anemia: likely of chronic disease/stress state/with blood loss: Has low iron stores with elevated ferritin. Reporting rectal bleeding due to hems. Has history of heme positive stools with long standing constipation--negative colonoscopy by Dr. Loreta Ave 05/2003   -pt denies any issues  -hgb has ranged from 7.8 to 9.1 during this stay.   down to 7.5  x 2  -consider GI follow up if Hgb dips further  -daily hgb  -fe+ studies show increased ferritin, Fe++ nl, normocytic  -heme check stool  11. Acute renal failure: Has resolved with IV fluids as well as medication adjustment. Continue to monitor.  12. Hypokalemia:continue to supplement  13. B-CAP:  levaquin completed.  14. HTN: Continue to hold HCTZ for now. lisinopriil resumed. Renal status improving   LOS (Days) 5 A FACE TO FACE EVALUATION WAS PERFORMED  Tashayla Therien T 08/21/2014 7:23 AM

## 2014-08-21 NOTE — Progress Notes (Signed)
Physical Therapy Session Note  Patient Details  Name: Governor SpeckingBarbara P Mccutchen MRN: [id number] Date of Birth: 21-Feb-1946  Today's Date: 08/21/2014 PT Individual Time: 1000-1100 and 1400-1445 PT Individual Time Calculation (min): 60 min and 45 min  Short Term Goals: Week 1:  PT Short Term Goal 1 (Week 1): STG's=LTG's secondary to anticipated LOS.  Skilled Therapeutic Interventions/Progress Updates:    AM Session: Patient received sitting in recliner. Session focused on increasing activity tolerance with functional mobility, high level balance, and core NMR. See details below for NMR. Gait training in controlled environment >150' several bouts with [facility] and supervision, cues for looking ahead. Stair negotiation x6 steps without handrails and use of SPC with supervision.   Education/discussion about falls risk at home and indications vs. Contraindications for calling EMS vs. Attempting floor transfer. Patient performed transfer to floor with supervision and up from floor with minA. Patient returned to room and left sitting in recliner with all needs within reach.  PM Session: Patient received sitting in recliner. Session focused on functional mobility, high level balance, and core NMR. Several bouts of gait >150' in controlled and home environments (day room, family room, carpeted and tile surfaces) without AD and supervision. Noted improvements in upward gaze during gait and improved reciprocal arm swing. Backwards walking x100' with supervision; ambulation while bouncing ball x200' with supervision. Seated on physioball LE marches and contralateral kicks with arm raises to improve core stability. Standing external perturbations at hips and shoulders to elicit hip/ankle/stepping strategies. Patient returned to room and left sitting in recliner with all needs within reach.  Therapy Documentation Precautions:  Precautions Precautions: Fall Restrictions Weight Bearing Restrictions: No General: PT Amount  of Missed Time (min): 15 Minutes PT Missed Treatment Reason: Other (Comment) (therapist late from meeting) Pain:  No pain/denies pain Locomotion : Ambulation Ambulation/Gait Assistance: 5: Supervision  Other Treatments: Treatments Neuromuscular Facilitation: Right;Left;Lower Extremity;Activity to increase sustained activation;Activity to increase motor control;Forced use Weight Bearing Technique Weight Bearing Technique: Yes RUE Weight Bearing Technique: Other (comment) (half kneeling) LUE Weight Bearing Technique: Other (comment) (half kneeling) Response to Weight Bearing Technique: Tall kneeling with letter ball toss, naming foods; patient requires min cues for accuracy with rules of game and min questioning cues for guidance with naming foods; Quadruped with progression to bird dogs (contralateral UE/LE extensions). Half kneeling with ipsilateral UE support for balance, progressing to hip flexor stretching and reaching with UEs for balance challenge.  See FIM for current functional status  Therapy/Group: Individual Therapy  Chipper HerbBridget S Corri Delapaz S. Mairyn Lenahan, PT, DPT 08/21/2014, 2:49 PM

## 2014-08-21 NOTE — Patient Care Conference (Signed)
Inpatient RehabilitationTeam Conference and Plan of Care Update Date: 08/21/2014   Time: 3:10 PM    Patient Name: Catherine [name]      Medical Record Number: [id number]  Date of Birth: [birth year] Sex: Female         Room/Bed: 4M03C/4M03C-01 Payor Info: Payor: MEDICARE / Plan: MEDICARE PART A AND B / Product Type: *No Product type* /    Admitting Diagnosis: Deconditioned after PNA  Admit Date/Time:  08/16/2014  3:36 PM Admission Comments: No comment available   Primary Diagnosis:  <principal problem not specified> Principal Problem: <principal problem not specified>  Patient Active Problem List   Diagnosis Date Noted  . Physical debility 08/16/2014  . Pneumonia 08/13/2014  . DKA, type 2 08/13/2014  . Acute renal failure 08/13/2014  . Hypernatremia 08/13/2014  . Elevated LFTs 08/13/2014  . Unspecified hypothyroidism 08/13/2014  . DKA (diabetic ketoacidoses) 08/13/2014    Expected Discharge Date: Expected Discharge Date: 08/23/14  Team Members Present: Physician leading conference: Dr. Faith RogueZachary Swartz Social Worker Present: Amada JupiterLucy Nylene Inlow, LCSW Nurse Present: Keturah BarreEd Knisley, RN PT Present: Cyndia SkeetersBridgett Ripa, Scot JunPT;Caroline King, PT OT Present: Roney MansJennifer Smith, OT;Ardis Rowanom Lanier, Darolyn RuaOTA;Kayla Perkinson, OT SLP Present: Feliberto Gottronourtney Payne, SLP PPS Coordinator present : Tora DuckMarie Noel, RN, CRRN     Current Status/Progress Goal Weekly Team Focus  Medical   deconditioning after pneumonia and acute renal failure, anemia  improve activity tolerance  anemia, nutrition, diabetes mgt   Bowel/Bladder   Continent of bowel and bladder. LBM 08/20/14  Pt to remain continent of bowel and bladder  Monitor   Swallow/Nutrition/ Hydration             ADL's   supervision-CGA overall with self-care tasks  Mod I ADLs   safety awareness, dynamic standing balance, activity tolerance, strengthening, problem solving, functional transfers, functional mobility    Mobility   supervision overall  mod I overall  education,  safety, functional mobility, balance, activity tolerance, strengthening   Communication             Safety/Cognition/ Behavioral Observations            Pain   No c/o pain  <3  Monitor for nonverbal cues for pain   Skin   CDI  CDI  Assess q shift, Routine turn q 2hrs    Rehab Goals Patient on target to meet rehab goals: Yes *See Care Plan and progress notes for long and short-term goals.  Barriers to Discharge: safety    Possible Resolutions to Barriers:  safety ed, mod I goals    Discharge Planning/Teaching Needs:  home with husband to provide 24/7 assistance      Team Discussion:  Deconditioned overall but doing well.  Mod i goals.  Home with husband  Revisions to Treatment Plan:  None   Continued Need for Acute Rehabilitation Level of Care: The patient requires daily medical management by a physician with specialized training in physical medicine and rehabilitation for the following conditions: Daily direction of a multidisciplinary physical rehabilitation program to ensure safe treatment while eliciting the highest outcome that is of practical value to the patient.: Yes Daily medical management of patient stability for increased activity during participation in an intensive rehabilitation regime.: Yes Daily analysis of laboratory values and/or radiology reports with any subsequent need for medication adjustment of medical intervention for : Neurological problems;Pulmonary problems;Cardiac problems  Athen Riel 08/21/2014, 3:36 PM

## 2014-08-22 ENCOUNTER — Inpatient Hospital Stay (HOSPITAL_COMMUNITY): Payer: Medicare Other

## 2014-08-22 ENCOUNTER — Inpatient Hospital Stay (HOSPITAL_COMMUNITY): Payer: Medicare Other | Admitting: *Deleted

## 2014-08-22 LAB — GLUCOSE, CAPILLARY
GLUCOSE-CAPILLARY: 159 mg/dL — AB (ref 70–99)
GLUCOSE-CAPILLARY: 116 mg/dL — AB (ref 70–99)
GLUCOSE-CAPILLARY: 196 mg/dL — AB (ref 70–99)
Glucose-Capillary: 173 mg/dL — ABNORMAL HIGH (ref 70–99)

## 2014-08-22 LAB — HEMOGLOBIN AND HEMATOCRIT, BLOOD
HCT: 25 % — ABNORMAL LOW (ref 36.0–46.0)
HEMOGLOBIN: 8.2 g/dL — AB (ref 12.0–15.0)

## 2014-08-22 NOTE — Progress Notes (Signed)
[place] PHYSICAL MEDICINE & REHABILITATION     PROGRESS NOTE    Subjective/Complaints: No new complaints. Slept well. No sob, cp, cough  Good appetite  Objective: Vital Signs: Blood pressure 112/68, pulse 74, temperature 97.7 F (36.5 C), temperature source Oral, resp. rate 18, height 5\' 6"  (1.676 m), weight 81.761 kg (180 lb 4 oz), SpO2 100.00%. No results found.  Recent Labs  08/20/14 0628 08/21/14 0713  HGB 7.5* 8.7*  HCT 21.9* 25.5*   No results found for this basename: NA, K, CL, CO, GLUCOSE, BUN, CREATININE, CALCIUM,  in the last 72 hours CBG (last 3)   Recent Labs  08/21/14 1626 08/21/14 2026 08/22/14 0557  GLUCAP 129* 147* 159*    Wt Readings from Last 3 Encounters:  08/22/14 81.761 kg (180 lb 4 oz)  08/16/14 75 kg (165 lb 5.5 oz)    Physical Exam:  Constitutional: She appears well-developed.  HENT: oral mucosa moist, pink  Head: Normocephalic.  Eyes: EOM are normal.  Neck: Normal range of motion. Neck supple. No thyromegaly present.  Cardiovascular: Normal rate and regular rhythm. No murmurs, rubs, gallops  Respiratory:  Decreased breath sounds at the bases but clear to auscultation. No wheezes, rales, rhonchi  GI: Soft. Bowel sounds are normal. She exhibits no distension.  Neurological:  Pt is alert/oriented to name,hospital. Follows simple commands. slightly irritable. Comprehends  basic info. Strength 4/5 UE's prox to distal. LE's: 3+HF, 4/5 ke, adf/apf. No sensory loss. Good sitting balance, dtr's 1+.  Skin: Skin is warm and dry.  Psych:   cooperative   Assessment/Plan: 1. Functional deficits secondary to deconditioning after bilateral pneumonia---complicated by HONK which require 3+ hours per day of interdisciplinary therapy in a comprehensive inpatient rehab setting. Physiatrist is providing close team supervision and 24 hour management of active medical problems listed below. Physiatrist and rehab team continue to assess barriers to  discharge/monitor patient progress toward functional and medical goals. FIM: FIM - Bathing Bathing Steps Patient Completed: Chest;Right Arm;Left Arm;Abdomen;Front perineal area;Buttocks;Right upper leg;Left lower leg (including foot);Right lower leg (including foot);Left upper leg Bathing: 5: Supervision: Safety issues/verbal cues  FIM - Upper Body Dressing/Undressing Upper body dressing/undressing steps patient completed: Thread/unthread right sleeve of pullover shirt/dresss;Thread/unthread left sleeve of pullover shirt/dress;Pull shirt over trunk;Put head through opening of pull over shirt/dress;Hook/unhook bra;Thread/unthread left bra strap;Thread/unthread right bra strap Upper body dressing/undressing: 5: Supervision: Safety issues/verbal cues FIM - Lower Body Dressing/Undressing Lower body dressing/undressing steps patient completed: Thread/unthread right underwear leg;Thread/unthread left pants leg;Thread/unthread left underwear leg;Pull pants up/down;Pull underwear up/down;Fasten/unfasten pants;Thread/unthread right pants leg;Don/Doff left shoe;Don/Doff right shoe;Fasten/unfasten right shoe;Fasten/unfasten left shoe Lower body dressing/undressing: 5: Set-up assist to: Don/Doff TED stocking  FIM - Toileting Toileting steps completed by patient: Performs perineal hygiene;Adjust clothing after toileting;Adjust clothing prior to toileting Toileting Assistive Devices: Grab bar or rail for support Toileting: 5: Supervision: Safety issues/verbal cues  FIM - Diplomatic Services operational officerToilet Transfers Toilet Transfers Assistive Devices: Grab bars Toilet Transfers: 5-From toilet/BSC: Supervision (verbal cues/safety issues);5-To toilet/BSC: Supervision (verbal cues/safety issues)  FIM - BankerBed/Chair Transfer Bed/Chair Transfer Assistive Devices: Teacher, musicCane Bed/Chair Transfer: 5: Supine > Sit: Supervision (verbal cues/safety issues)  FIM - Locomotion: Wheelchair Distance: 75 Locomotion: Wheelchair: 0: Activity did not occur  (patient ambulatory) FIM - Locomotion: Ambulation Locomotion: Ambulation Assistive Devices: Other (comment) (none) Ambulation/Gait Assistance: 5: Supervision Locomotion: Ambulation: 5: Travels 150 ft or more with supervision/safety issues  Comprehension Comprehension Mode: Auditory Comprehension: 5-Follows basic conversation/direction: With no assist  Expression Expression Mode: Verbal Expression: 6-Expresses complex ideas:  With extra time/assistive device  Social Interaction Social Interaction: 5-Interacts appropriately 90% of the time - Needs monitoring or encouragement for participation or interaction.  Problem Solving Problem Solving: 5-Solves basic 90% of the time/requires cueing < 10% of the time  Memory Memory: 5-Recognizes or recalls 90% of the time/requires cueing < 10% of the time Medical Problem List and Plan:  1. Functional deficits secondary to Deconditioning complicated by bilateral PNA and hyperosmotic non-ketotic coma  2. DVT Prophylaxis/Anticoagulation: Pharmaceutical: Lovenox  3. Pain Management: N/A  4. Mood: LCSW to follow for evaluation and support.  5. Neuropsych: This patient is capable of making decisions on her own behalf.  6. Skin/Wound Care: Routine pressure relief measures.  7. Fluids/Electrolytes/Nutrition:Monitor I/O. Encourage fluid intake.   9. DM type 2: HGB A1c-7.5. Was on Kombiglyze XR at home. Was Started on Lantus with SSI for elevated BS.  resumed sitagliptin and amaryl.   off lantus. Avoid metformin   -increased amaryl to 4mg ---improving control 10. Anemia: likely of chronic disease/stress state/with blood loss: Has low iron stores with elevated ferritin. Reporting rectal bleeding due to hems. Has history of heme positive stools with long standing constipation--negative colonoscopy by Dr. Loreta Ave 05/2003   -pt denies any new blood  -hgb has ranged from 7.8 to 9.1 during this stay.   Up to 8.6 yesterday  -consider GI follow up if Hgb dips  further  -daily hgb  -fe+ studies show increased ferritin, Fe++ nl, normocytic     11. Acute renal failure: Has resolved with IV fluids as well as medication adjustment. Continue to monitor.  12. Hypokalemia:continue to supplement  13. B-CAP:  levaquin completed.  14. HTN: Continue to hold HCTZ for now. lisinopriil resumed. Renal status improved  -can resume hctz as outpt   LOS (Days) 6 A FACE TO FACE EVALUATION WAS PERFORMED  Nate Perri T 08/22/2014 7:21 AM

## 2014-08-22 NOTE — Progress Notes (Signed)
Occupational Therapy Discharge Summary and Treatment Note  Patient Details  Name: Catherine [name] MRN: [id number] Date of Birth: 1946-06-15  Today's Date: 08/22/2014 OT Individual Time: 1100-1200 OT Individual Time Calculation (min): 60 min    Patient has met 10 of 10 long term goals due to improved activity tolerance, improved balance, postural control, ability to compensate for deficits, improved attention, improved awareness and improved coordination.  Patient to discharge at overall Modified Independent level.  Patient's care partner is independent to provide the necessary physical and cognitive assistance at discharge.    Reasons goals not met: [name]/A. All LTGs met.   Recommendation:  No skilled occupational therapy recommended at this time.   Equipment: No equipment provided  Reasons for discharge: treatment goals met and discharge from hospital  Patient/family agrees with progress made and goals achieved: Yes  Skilled Therapeutic Intervention Pt seen for ADL retraining with focus on safety and emergent awareness, functional mobility, functional transfers, and dynamic standing balance. Pt received sitting in recliner chair. Pt retrieved all items then ambulated to ADL apartment to complete bathing in tub shower. Pt completed tub transfer, bathing, and dressing at Mod I level. Ambulated throughout rehab unit at Mod I level using [place] with pt demonstrating good emergent awareness. Completed simple meal prep and vacuuming task at mod I level. Pt returned to room and left with all needs in reach. No questions or concerns about discharge at this time.   OT Discharge Precautions/Restrictions  Precautions Precautions: Fall Restrictions Weight Bearing Restrictions: No General   Vital Signs Therapy Vitals Temp: 97.8 F (36.6 C) Temp Source: Oral Pulse Rate: 76 Resp: 18 BP: 118/48 mmHg Patient Position (if appropriate): Sitting Oxygen Therapy SpO2: 100 % O2 Device: None (Room  air) Pain  No report of pain.  ADL   Vision/Perception  Vision- History Baseline Vision/History: Wears glasses Wears Glasses: Reading only;Distance only (reading and driving) Patient Visual Report: No change from baseline Vision- Assessment Vision Assessment?: No apparent visual deficits  Cognition Overall Cognitive Status: Within Functional Limits for tasks assessed Arousal/Alertness: Awake/alert Orientation Level: Oriented X4 Attention: Selective Selective Attention: Appears intact Memory: Appears intact (Mod I with use of external aid) Awareness: Appears intact Problem Solving: Appears intact Safety/Judgment: Appears intact Sensation Sensation Light Touch: Appears Intact Hot/Cold: Appears Intact Coordination Gross Motor Movements are Fluid and Coordinated: Yes Motor  Motor Motor: Within Functional Limits Mobility  Bed Mobility Bed Mobility: Supine to Sit;Sit to Supine Supine to Sit: 6: Modified independent (Device/Increase time) Sit to Supine: 6: Modified independent (Device/Increase time) Transfers Transfers: Sit to Stand;Stand to Sit Sit to Stand: 6: Modified independent (Device/Increase time) Stand to Sit: 6: Modified independent (Device/Increase time)  Trunk/Postural Assessment  Cervical Assessment Cervical Assessment: Within Functional Limits Thoracic Assessment Thoracic Assessment: Within Functional Limits Lumbar Assessment Lumbar Assessment: Within Functional Limits Postural Control Postural Control: Within Functional Limits  Balance Balance Balance Assessed: Yes Static Standing Balance Static Standing - Balance Support: Right upper extremity supported (Mod I) Dynamic Standing Balance Dynamic Standing - Balance Support: Right upper extremity supported (Mod I) Extremity/Trunk Assessment RUE Assessment RUE Assessment: Within Functional Limits LUE Assessment LUE Assessment: Within Functional Limits  See FIM for current functional  status  Catherine [name] 08/22/2014, 3:02 PM

## 2014-08-22 NOTE — Progress Notes (Signed)
Physical Therapy Discharge Summary  Patient Details  Name: Catherine [name] MRN: [id number] Date of Birth: 02-23-1946  Today's Date: 08/22/2014  Patient has met 44 of 15 long term goals due to improved activity tolerance, improved balance, improved postural control, ability to compensate for deficits, improved attention, improved awareness and improved coordination.  Patient to discharge at an ambulatory level Modified Independent.   Patient's care partner not necessary secondary to patient discharging at mod I level to provide the necessary assistance at discharge.  Reasons goals not met: N/A, patient met all LTGs.  Recommendation:  Patient will benefit from ongoing skilled PT services in outpatient setting to continue to advance safe functional mobility, address ongoing impairments in gait, balance, activity tolerance, strength, coordination, overall functional mobility, and minimize fall risk.  Equipment: [facility]  Reasons for discharge: treatment goals met and discharge from hospital  Patient/family agrees with progress made and goals achieved: Yes  PT Discharge Precautions/Restrictions Precautions Precautions: Fall Precaution Comments: knees buckle Restrictions Weight Bearing Restrictions: No Vision/Perception    See OT discharge Cognition Overall Cognitive Status: Within Functional Limits for tasks assessed Arousal/Alertness: Awake/alert Orientation Level: Oriented X4 Attention: Selective Sustained Attention: Impaired Sustained Attention Impairment: Verbal basic;Functional basic Selective Attention: Appears intact Selective Attention Impairment: Verbal basic;Functional basic Memory: Appears intact (Mod I with use of external aid) Memory Impairment: Decreased recall of new information Decreased Short Term Memory: Verbal basic;Functional basic Awareness: Appears intact Awareness Impairment: Emergent impairment Problem Solving: Appears intact Problem Solving Impairment:  Functional basic Executive Function:  (Mod I ) Self Monitoring: Impaired Self Monitoring Impairment: Functional basic Safety/Judgment: Appears intact Sensation Sensation Light Touch: Appears Intact Hot/Cold: Appears Intact Proprioception: Impaired by gross assessment (bilat LE's) Coordination Gross Motor Movements are Fluid and Coordinated: Yes Fine Motor Movements are Fluid and Coordinated: Yes Heel Shin Test: Smoothness of movement limited on LLE as compared with RLE. Motor  Motor Motor: Within Functional Limits  Mobility Bed Mobility Bed Mobility: Supine to Sit;Sit to Supine Supine to Sit: 6: Modified independent (Device/Increase time) Sit to Supine: 6: Modified independent (Device/Increase time) Transfers Transfers: Yes Sit to Stand: 6: Modified independent (Device/Increase time) Sit to Stand Details: Verbal cues for technique Stand to Sit: 6: Modified independent (Device/Increase time) Stand to Sit Details (indicate cue type and reason): Verbal cues for technique;Verbal cues for precautions/safety Stand Pivot Transfers: 4: Min assist;Other (comment) (HHA) Stand Pivot Transfer Details: Verbal cues for precautions/safety Locomotion  Ambulation Ambulation: Yes Ambulation/Gait Assistance: 6: Modified independent (Device/Increase time) Assistive device: 1 person hand held assist Ambulation/Gait Assistance Details: Verbal cues for technique Gait Gait: Yes Gait Pattern: Impaired Gait Pattern: Step-through pattern;Wide base of support;Decreased trunk rotation (minimal arm swing bilaterally) Stairs / Additional Locomotion Stairs: Yes Stairs Assistance: 5: Supervision Stairs Assistance Details: Verbal cues for technique;Verbal cues for sequencing Stair Management Technique: Two rails;Step to pattern;Forwards Architect: Yes Environmental health practitioner: Both upper extremities Wheelchair Parts Management: Needs assistance  Trunk/Postural Assessment   Cervical Assessment Cervical Assessment: Within Scientist, physiological Assessment: Within Functional Limits Lumbar Assessment Lumbar Assessment: Within Functional Limits Postural Control Postural Control: Within Functional Limits Righting Reactions: Ankle and stepping strategy present. No hip strategy present with posterior LOB.  Balance Balance Balance Assessed: Yes Standardized Balance Assessment Standardized Balance Assessment: Berg Balance Test;Timed Up and Go Test;Dynamic Gait Index Berg Balance Test Sit to Stand: Able to stand without using hands and stabilize independently Standing Unsupported: Able to stand safely 2 minutes Sitting with Back Unsupported but Feet Supported  on Floor or Stool: Able to sit safely and securely 2 minutes Stand to Sit: Sits safely with minimal use of hands Transfers: Able to transfer safely, definite need of hands Standing Unsupported with Eyes Closed: Able to stand 10 seconds with supervision Standing Ubsupported with Feet Together: Able to place feet together independently and stand for 1 minute with supervision From Standing, Reach Forward with Outstretched Arm: Can reach forward >12 cm safely (5") From Standing Position, Pick up Object from Floor: Able to pick up shoe safely and easily From Standing Position, Turn to Look Behind Over each Shoulder: Looks behind from both sides and weight shifts well Turn 360 Degrees: Able to turn 360 degrees safely but slowly Standing Unsupported, Alternately Place Feet on Step/Stool: Able to stand independently and safely and complete 8 steps in 20 seconds Standing Unsupported, One Foot in Front: Able to take small step independently and hold 30 seconds Standing on One Leg: Able to lift leg independently and hold equal to or more than 3 seconds Total Score: 46/52, indicating patient is at moderate risk for falls (>50%). Dynamic Gait Index Level Surface: Normal Change in Gait Speed: Mild  Impairment Gait with Horizontal Head Turns: Mild Impairment Gait with Vertical Head Turns: Mild Impairment Gait and Pivot Turn: Normal Step Over Obstacle: Mild Impairment Step Around Obstacles: Mild Impairment Steps: Moderate Impairment Total Score: 17/22, indicating patient is at increased risk for falls Timed Up and Go Test TUG: Normal TUG;Cognitive TUG Normal TUG (seconds): 14.36, score >13.5" indicates patient is at increased risk for falls. Manual TUG (seconds): 16.25 Cognitive TUG (seconds): 17.06 Dynamic Sitting Balance Sitting balance - Comments: pt keeping her eyes closed and wavering in sitting, did not attempt to steady herself with her UEs Static Standing Balance Static Standing - Balance Support: Right upper extremity supported (Mod I) Dynamic Standing Balance Dynamic Standing - Balance Support: Right upper extremity supported (Mod I) Extremity Assessment  RLE Assessment RLE Assessment: Exceptions to [facility] RLE Strength RLE Overall Strength: Deficits RLE Overall Strength Comments: Grossly 4-/5 to 4/5 LLE Assessment LLE Assessment: Exceptions to [facility] LLE Strength LLE Overall Strength: Deficits LLE Overall Strength Comments: Grossly 4-/5 to 4/5  See FIM for current functional status  Micheil Klaus S Payslee Bateson S. Braydin Aloi, PT, DPT 08/22/2014, 8:11 PM

## 2014-08-22 NOTE — Progress Notes (Addendum)
Physical Therapy Session Note  Patient Details  Name: Catherine [name] MRN: [id number] Date of Birth: 04/02/1946  Today's Date: 08/22/2014 PT Individual Time: 0800-0900 PT Individual Time Calculation (min): 60 min  Session 2 Time: 1500-1600 Time Calculation (min): 60 min   Short Term Goals: Week 1:  PT Short Term Goal 1 (Week 1): STG's=LTG's secondary to anticipated LOS.  Skilled Therapeutic Interventions/Progress Updates:    Session 1: Pt received supine in bed, agreeable to participate in therapy after getting dressed. Pt able to dress UB and LB after therapist obtained clothing, in therapist's judgement pt would have been safe to obtain clothing herself. Pt ambulated w/ SPC throughout session w/ mod (I) Pt ambulated 100' to ortho gym, performed car transfer w/ mod (I). In ADL apartment pt performed furniture transfers w/ mod (I), put cases on pillows for dynamic standing balance w/ mod (I), moved supine <> sit on standard bed w/ mod (I). Re-evaluated balance w/ Berg Balance Test, TUG Normal, TUG Cognitive, see details below. Pt ambulated back to room, left seated in recliner w/ all needs within reach.   Session 2: Pt received seated in recliner, agreeable to participate in therapy. Pt completed TUG Manual w/ cup of water and Dynamic Gait Index, see below for details. Pt completed 10' on Nustep L4 for overall strengthening. Pt ambulated w/ SPC through simulated home environment 100' and controlled environment >300' w/ mod (I). Pt returned to room, left seated in recliner w/ all needs within reach.   Therapy Documentation Precautions:  Precautions Precautions: Fall Restrictions Weight Bearing Restrictions: No General:   Vital Signs: Therapy Vitals Temp: 97.7 F (36.5 C) Temp Source: Oral Pulse Rate: 74 Resp: 18 BP: 112/68 mmHg Patient Position (if appropriate): Lying Oxygen Therapy SpO2: 100 % O2 Device: None (Room air) Pain: Pain Assessment Pain Assessment: No/denies  pain Mobility:   Locomotion :    Trunk/Postural Assessment :    Balance: Balance Balance Assessed: Yes Standardized Balance Assessment Standardized Balance Assessment: Berg Balance Test;Timed Up and Go Test;Dynamic Gait Index Berg Balance Test Sit to Stand: Able to stand without using hands and stabilize independently Standing Unsupported: Able to stand safely 2 minutes Sitting with Back Unsupported but Feet Supported on Floor or Stool: Able to sit safely and securely 2 minutes Stand to Sit: Sits safely with minimal use of hands Transfers: Able to transfer safely, definite need of hands Standing Unsupported with Eyes Closed: Able to stand 10 seconds with supervision Standing Ubsupported with Feet Together: Able to place feet together independently and stand for 1 minute with supervision From Standing, Reach Forward with Outstretched Arm: Can reach forward >12 cm safely (5") From Standing Position, Pick up Object from Floor: Able to pick up shoe safely and easily From Standing Position, Turn to Look Behind Over each Shoulder: Looks behind from both sides and weight shifts well Turn 360 Degrees: Able to turn 360 degrees safely but slowly Standing Unsupported, Alternately Place Feet on Step/Stool: Able to stand independently and safely and complete 8 steps in 20 seconds Standing Unsupported, One Foot in Front: Able to take small step independently and hold 30 seconds Standing on One Leg: Able to lift leg independently and hold equal to or more than 3 seconds Total Score: 46 Dynamic Gait Index Level Surface: Normal Change in Gait Speed: Mild Impairment Gait with Horizontal Head Turns: Mild Impairment Gait with Vertical Head Turns: Mild Impairment Gait and Pivot Turn: Normal Step Over Obstacle: Mild Impairment Step Around Obstacles: Mild Impairment Steps:  Moderate Impairment Total Score: 17 Timed Up and Go Test TUG: Normal TUG;Cognitive TUG Normal TUG (seconds): 14.36 Manual TUG  (seconds): 16.25 Cognitive TUG (seconds): 17.06  See FIM for current functional status  Therapy/Group: Individual Therapy  Hosie Spangle Hosie Spangle, PT, DPT 08/22/2014, 7:36 AM

## 2014-08-22 NOTE — Progress Notes (Signed)
Social Work Patient ID: [name], female   DOB: 1946/05/17, 68 y.o.   MRN: [id number]  Amada Jupiter, LCSW Social Worker Signed  Patient Care Conference Service date: 08/21/2014 3:36 PM  Inpatient RehabilitationTeam Conference and Plan of Care Update Date: 08/21/2014   Time: 3:10 PM     Patient Name: Catherine [name]       Medical Record Number: [id number]   Date of Birth: [birth year] Sex: Female         Room/Bed: 4M03C/4M03C-01 Payor Info: Payor: MEDICARE / Plan: MEDICARE PART A AND B / Product Type: *No Product type* /   Admitting Diagnosis: Deconditioned after PNA   Admit Date/Time:  08/16/2014  3:36 PM Admission Comments: No comment available   Primary Diagnosis:  <principal problem not specified> Principal Problem: <principal problem not specified>    Patient Active Problem List     Diagnosis  Date Noted   .  Physical debility  08/16/2014   .  Pneumonia  08/13/2014   .  DKA, type 2  08/13/2014   .  Acute renal failure  08/13/2014   .  Hypernatremia  08/13/2014   .  Elevated LFTs  08/13/2014   .  Unspecified hypothyroidism  08/13/2014   .  DKA (diabetic ketoacidoses)  08/13/2014     Expected Discharge Date: Expected Discharge Date: 08/23/14  Team Members Present: Physician leading conference: Dr. Faith Rogue Social Worker Present: Amada Jupiter, LCSW Nurse Present: Keturah Barre, RN PT Present: Cyndia Skeeters, Scot Jun, PT OT Present: Roney Mans, OT;Ardis Rowan, Darolyn Rua, OT SLP Present: Feliberto Gottron, SLP PPS Coordinator present : Tora Duck, RN, CRRN        Current Status/Progress  Goal  Weekly Team Focus   Medical     deconditioning after pneumonia and acute renal failure, anemia  improve activity tolerance  anemia, nutrition, diabetes mgt   Bowel/Bladder     Continent of bowel and bladder. LBM 08/20/14  Pt to remain continent of bowel and bladder  Monitor   Swallow/Nutrition/ Hydration            ADL's     supervision-CGA overall  with self-care tasks  Mod I ADLs   safety awareness, dynamic standing balance, activity tolerance, strengthening, problem solving, functional transfers, functional mobility    Mobility     supervision overall  mod I overall  education, safety, functional mobility, balance, activity tolerance, strengthening   Communication            Safety/Cognition/ Behavioral Observations           Pain     No c/o pain  <3  Monitor for nonverbal cues for pain   Skin     CDI  CDI  Assess q shift, Routine turn q 2hrs    Rehab Goals Patient on target to meet rehab goals: Yes *See Care Plan and progress notes for long and short-term goals.    Barriers to Discharge:  safety     Possible Resolutions to Barriers:    safety ed, mod I goals      Discharge Planning/Teaching Needs:    home with husband to provide 24/7 assistance      Team Discussion:    Deconditioned overall but doing well.  Mod i goals.  Home with husband   Revisions to Treatment Plan:    None    Continued Need for Acute Rehabilitation Level of Care: The patient requires daily medical management by a physician with specialized training in  physical medicine and rehabilitation for the following conditions: Daily direction of a multidisciplinary physical rehabilitation program to ensure safe treatment while eliciting the highest outcome that is of practical value to the patient.: Yes Daily medical management of patient stability for increased activity during participation in an intensive rehabilitation regime.: Yes Daily analysis of laboratory values and/or radiology reports with any subsequent need for medication adjustment of medical intervention for : Neurological problems;Pulmonary problems;Cardiac problems  Korde Jeppsen 08/21/2014, 3:36 PM

## 2014-08-22 NOTE — Progress Notes (Signed)
Social Work Patient ID: Catherine [name], female   DOB: 07-28-1946, 68 y.o.   MRN: [id number]  Met with pt, husband and son yesterday afternoon to review team conference.  All aware and agreeable with targeted d/c 10/8 and aware that no f/u therapies are being recommended.  Possible need for [facility]?  Will monitor other d/c needs and assist.    Marque Rademaker, LCSW

## 2014-08-23 DIAGNOSIS — D649 Anemia, unspecified: Secondary | ICD-10-CM | POA: Diagnosis present

## 2014-08-23 DIAGNOSIS — E119 Type 2 diabetes mellitus without complications: Secondary | ICD-10-CM

## 2014-08-23 DIAGNOSIS — N1831 Chronic kidney disease, stage 3a: Secondary | ICD-10-CM

## 2014-08-23 DIAGNOSIS — E876 Hypokalemia: Secondary | ICD-10-CM | POA: Diagnosis present

## 2014-08-23 DIAGNOSIS — J189 Pneumonia, unspecified organism: Secondary | ICD-10-CM | POA: Diagnosis present

## 2014-08-23 HISTORY — DX: Pneumonia, unspecified organism: J18.9

## 2014-08-23 LAB — HEMOGLOBIN AND HEMATOCRIT, BLOOD
HEMATOCRIT: 23.7 % — AB (ref 36.0–46.0)
Hemoglobin: 7.9 g/dL — ABNORMAL LOW (ref 12.0–15.0)

## 2014-08-23 LAB — GLUCOSE, CAPILLARY: GLUCOSE-CAPILLARY: 161 mg/dL — AB (ref 70–99)

## 2014-08-23 MED ORDER — GLIMEPIRIDE 4 MG PO TABS: 4.0000 mg | ORAL_TABLET | Freq: Every day | ORAL | Status: DC

## 2014-08-23 MED ORDER — FERROUS SULFATE 325 (65 FE) MG PO TABS: 325.0000 mg | ORAL_TABLET | Freq: Two times a day (BID) | ORAL | Status: DC

## 2014-08-23 MED ORDER — LINAGLIPTIN 5 MG PO TABS: 5.0000 mg | ORAL_TABLET | Freq: Every day | ORAL | Status: DC

## 2014-08-23 MED ORDER — POLYETHYLENE GLYCOL 3350 17 G PO PACK: 17.0000 g | PACK | Freq: Every day | ORAL | Status: DC

## 2014-08-23 NOTE — Progress Notes (Signed)
[facility] PHYSICAL MEDICINE & REHABILITATION     PROGRESS NOTE    Subjective/Complaints: Feeling well. Happy to be going home. No rectal bleeding     Objective: Vital Signs: Blood pressure 105/62, pulse 76, temperature 98.4 F (36.9 C), temperature source Oral, resp. rate 19, height 5\' 6"  (1.676 m), weight 81.4 kg (179 lb 7.3 oz), SpO2 96.00%. No results found.  Recent Labs  08/22/14 0938 08/23/14 0555  HGB 8.2* 7.9*  HCT 25.0* 23.7*   No results found for this basename: NA, K, CL, CO, GLUCOSE, BUN, CREATININE, CALCIUM,  in the last 72 hours CBG (last 3)   Recent Labs  08/22/14 1638 08/22/14 2056 08/23/14 0649  GLUCAP 116* 196* 161*    Wt Readings from Last 3 Encounters:  08/23/14 81.4 kg (179 lb 7.3 oz)  08/16/14 75 kg (165 lb 5.5 oz)    Physical Exam:  Constitutional: She appears well-developed.  HENT: oral mucosa moist, pink  Head: Normocephalic.  Eyes: EOM are normal.  Neck: Normal range of motion. Neck supple. No thyromegaly present.  Cardiovascular: Normal rate and regular rhythm. No murmurs, rubs, gallops  Respiratory:  Decreased breath sounds at the bases but clear to auscultation. No wheezes, rales, rhonchi  GI: Soft. Bowel sounds are normal. She exhibits no distension.  Neurological:  Pt is alert/oriented to name,hospital. Follows simple commands. slightly irritable. Comprehends  basic info. Strength 4/5 UE's prox to distal. LE's: 4-HF, 4/5 ke, adf/apf.  No sensory loss. Good sitting balance, dtr's 1+.  Skin: Skin is warm and dry.  Psych:   cooperative   Assessment/Plan: 1. Functional deficits secondary to deconditioning after bilateral pneumonia---complicated by HONK which require 3+ hours per day of interdisciplinary therapy in a comprehensive inpatient rehab setting. Physiatrist is providing close team supervision and 24 hour management of active medical problems listed below. Physiatrist and rehab team continue to assess barriers to  discharge/monitor patient progress toward functional and medical goals.  FIM: FIM - Bathing Bathing Steps Patient Completed: Chest;Right Arm;Left Arm;Abdomen;Front perineal area;Buttocks;Right upper leg;Left lower leg (including foot);Right lower leg (including foot);Left upper leg Bathing: 6: Assistive device (Comment) (sitting on TTB)  FIM - Upper Body Dressing/Undressing Upper body dressing/undressing steps patient completed: Thread/unthread right sleeve of pullover shirt/dresss;Thread/unthread left sleeve of pullover shirt/dress;Pull shirt over trunk;Put head through opening of pull over shirt/dress;Hook/unhook bra;Thread/unthread left bra strap;Thread/unthread right bra strap Upper body dressing/undressing: 6: More than reasonable amount of time FIM - Lower Body Dressing/Undressing Lower body dressing/undressing steps patient completed: Thread/unthread right underwear leg;Thread/unthread left pants leg;Thread/unthread left underwear leg;Pull pants up/down;Pull underwear up/down;Fasten/unfasten pants;Thread/unthread right pants leg;Don/Doff left shoe;Don/Doff right shoe;Fasten/unfasten right shoe;Fasten/unfasten left shoe Lower body dressing/undressing: 6: More than reasonable amount of time  FIM - Toileting Toileting steps completed by patient: Performs perineal hygiene;Adjust clothing after toileting;Adjust clothing prior to toileting Toileting Assistive Devices: Grab bar or rail for support Toileting: 6: Assistive device: No helper  FIM - Diplomatic Services operational officer Devices: Occupational hygienist Transfers: 6-More than reasonable amt of time;6-Assistive device: No helper;6-To toilet/ BSC;6-From toilet/BSC  FIM - Banker Devices: Teacher, music: 6: Assistive device: no helper;6: More than reasonable amt of time  FIM - Locomotion: Wheelchair Distance: 75 Locomotion: Wheelchair: 0: Activity did not occur (Pt is ambulatory) FIM -  Locomotion: Ambulation Locomotion: Ambulation Assistive Devices: Emergency planning/management officer Ambulation/Gait Assistance: 6: Modified independent (Device/Increase time) Locomotion: Ambulation: 6: Travels 150 ft or more with assistive device/no helper  Comprehension Comprehension Mode: Auditory Comprehension:  6-Follows complex conversation/direction: With extra time/assistive device  Expression Expression Mode: Verbal Expression: 6-Expresses complex ideas: With extra time/assistive device  Social Interaction Social Interaction: 7-Interacts appropriately with others - No medications needed.  Problem Solving Problem Solving: 6-Solves complex problems: With extra time  Memory Memory: 6-More than reasonable amt of time  Medical Problem List and Plan:  1. Functional deficits secondary to Deconditioning complicated by bilateral PNA and hyperosmotic non-ketotic coma  2. DVT Prophylaxis/Anticoagulation: Pharmaceutical: Lovenox  3. Pain Management: N/A  4. Mood: LCSW to follow for evaluation and support.  5. Neuropsych: This patient is capable of making decisions on her own behalf.  6. Skin/Wound Care: Routine pressure relief measures.  7. Fluids/Electrolytes/Nutrition:Monitor I/O. Encourage fluid intake.   9. DM type 2: HGB A1c-7.5. Was on Kombiglyze XR at home. Was Started on Lantus with SSI for elevated BS.  resumed sitagliptin and amaryl.   off lantus. Avoid metformin   -increased amaryl to 4mg -- 10. Anemia: likely of chronic disease/stress state/with blood loss: Has low iron stores with elevated ferritin. Reporting rectal bleeding due to hems. Has history of heme positive stools with long standing constipation--negative colonoscopy by Dr. Loreta AveMann 05/2003   -pt denies any new blood  -hgb has ranged from 7.8 to 9.1 during this stay.  Baseline seems to be around 8.0  -consider outpt GI follow up    -fe+ studies show increased ferritin, Fe++ nl, normocytic     11. Acute renal failure: Has resolved with  IV fluids as well as medication adjustment. Continue to monitor.  12. Hypokalemia:continue to supplement  13. B-CAP:  levaquin completed.  14. HTN: Continue to hold HCTZ for now. lisinopriil resumed. Renal status improved  -can resume hctz as outpt per pcp   LOS (Days) 7 A FACE TO FACE EVALUATION WAS PERFORMED  SWARTZ,ZACHARY T 08/23/2014 7:26 AM

## 2014-08-23 NOTE — Progress Notes (Signed)
Social Work  Discharge Note  The overall goal for the admission was met for:   Discharge location: Yes - home with husband and son who can provide any needed assistance  Length of Stay: Yes - 7 days  Discharge activity level: Yes - independent  Home/community participation: Yes  Services provided included: MD, RD, PT, OT, RN, TR, Pharmacy and [place]: Medicare  Follow-up services arranged: DME: straight cane via [place] and Patient/Family has no preference for HH/DME agencies  Comments (or additional information): No follow up therapies or HHRN needs  Patient/Family verbalized understanding of follow-up arrangements: Yes  Individual responsible for coordination of the follow-up plan: patient  Confirmed correct DME delivered: Lauris Serviss 08/23/2014    [place], [place]

## 2014-08-23 NOTE — Discharge Summary (Signed)
swar Physician Discharge Summary  Patient ID: Catherine [name] MRN: [id number] DOB/AGE: 01/06/1946 68 y.o.  Admit date: 08/16/2014 Discharge date: 08/23/2014  Discharge Diagnoses:  Principal Problem:   Physical debility Active Problems:   Diabetes type 2, controlled   Anemia   Hypokalemia   CAP (community acquired pneumonia)--treated   Discharged Condition: Stable.    Labs:  Basic Metabolic Panel:    Component Value Date/Time   NA 140 08/17/2014 0615   K 3.4* 08/17/2014 0615   CL 106 08/17/2014 0615   CO2 22 08/17/2014 0615   GLUCOSE 238* 08/17/2014 0615   BUN 15 08/17/2014 0615   CREATININE 0.97 08/17/2014 0615   CALCIUM 8.6 08/17/2014 0615   GFRNONAA 59* 08/17/2014 0615   GFRAA 69* 08/17/2014 0615      CBC:  Recent Labs Lab 08/22/14 0938 08/23/14 0555  HGB 8.2* 7.9*  HCT 25.0* 23.7*    CBG:  Recent Labs Lab 08/22/14 0557 08/22/14 1156 08/22/14 1638 08/22/14 2056 08/23/14 0649  GLUCAP 159* 173* 116* 196* 161*    Brief HPI:   Catherine [name] is a 68 year old female with history of DM type 2, HTN, hypothyroid who was admitted on 08/13/14 with one week history of weakness with difficulty walking as well as 2 day history of productive cough with lethargy and slurred speech. She was found to have bilateral CAP with acute renal failure with BUN/Cr- 83/2.0, BS - 514 and CK 4435. She was started on IV antibiotics, gentle hydration as well as as IV insulin. Lisinopril and Diuretics d/c. UDS negative. Lactate normal. Patient with abnormal LFTs and Ammonia levels elevated at 89. TSH-1.090. MRI brain without acute abnormality and mild chronic small vessel disease. Lethargy has resolved but patient continue to have delayed processing with difficulty following simple commands. The patient was screened by the inpatient rehab team, and ultimately she was admitted to CIR.    Hospital Course: EDNAMAE SCHIANO was admitted to rehab 08/16/2014 for inpatient therapies to consist  of PT, ST and OT at least three hours five days a week. Past admission physiatrist, therapy team and rehab RN have worked together to provide customized collaborative inpatient rehab. Respiratory status has been stable and she has been afebrile. She completed 5 additional days po antibiotic past admission. She was started on Miralax to help with constipation as she reported some hemorrhoidal bleeding. H/H has been monitored and has ranged from 7.5 to 8.7 range. She was advised to follow up with GI for further work up of anemia and as well as abnormal LFTs. Po intake has been good and she is continent of bowel and bladder. Hypokalemia was supplemented during her rehab stay.  Patient's speech and language abilities were within normal range and she was able to use external aids to help recall new information therefore no speech therapy was needed during her stay. She has had improvement in endurance and activity level and was modified independent at discharge. No follow up therapies needed.    Rehab course: During patient's stay in rehab weekly team conferences were held to monitor patient's progress, set goals and discuss barriers to discharge. Patient has had improvement in activity tolerance, balance, postural control, as well as ability to compensate for deficits. Patient was modified independent for ADLs, transfers and ambulation. She was able to ambulate >300 feet with straight cane in controlled environment. She requires supervision with sequencing cues for navigating stairs. Family education was done with husband who can assist as needed past  discharge.      Disposition: 01-Home or Self Care   Diet: Regular.   Special Instructions: 1. DO NOT use Kombiglyze or Prinizide. 2. Check blood sugars twice a day before meals. 3. Drink plenty of fluids daily.    Medication List    STOP taking these medications       insulin glargine 100 UNIT/ML injection  Commonly known as:  LANTUS      levofloxacin 750 MG tablet  Commonly known as:  LEVAQUIN     lisinopril 10 MG tablet  Commonly known as:  PRINIVIL     metFORMIN 500 MG tablet  Commonly known as:  GLUCOPHAGE      TAKE these medications       aspirin 81 MG tablet  Take 81 mg by mouth daily.     beta carotene w/minerals tablet  Take 1 tablet by mouth daily.     ferrous sulfate 325 (65 FE) MG tablet  Take 1 tablet (325 mg total) by mouth 2 (two) times daily with a meal.     glimepiride 4 MG tablet  Commonly known as:  AMARYL  Take 1 tablet (4 mg total) by mouth daily with breakfast.     levothyroxine 50 MCG tablet  Commonly known as:  SYNTHROID, LEVOTHROID  Take 50 mcg by mouth daily before breakfast.     linagliptin 5 MG Tabs tablet  Commonly known as:  TRADJENTA  Take 1 tablet (5 mg total) by mouth daily. For diabetes     polyethylene glycol packet  Commonly known as:  MIRALAX / GLYCOLAX  Take 17 g by mouth daily. To prevent constipation     VITAMIN B 12 PO  Take 1,000 mg by mouth daily.       Follow-up Information   Call Ranelle OysterSWARTZ,ZACHARY T, MD. (As needed)    Specialty:  Physical Medicine and Rehabilitation   Contact information:   [address] [place] [address] [phone]       Call Charna ElizabethMANN,JYOTHI, MD. (for follow up on abnormal liver function/anemia)    Specialty:  Gastroenterology   Contact information:   [address], [address] [place] [address] [phone]       Follow up with Gwynneth AlimentSANDERS,ROBYN N, MD On 08/31/2014. ( appointment  @ 4:00 pm for post hospital follow up including labs (CBC and CMET.))    Specialty:  Internal Medicine   Contact information:   [address] [place] [address] [phone]       Signed: Jacquelynn CreeLove, Pamela S 08/28/2014, 4:01 PM

## 2014-08-23 NOTE — Progress Notes (Signed)
Patient is awake, provided her with 300cc of water to drink.  Diamantina MonksARPENTER,Catilyn Boggus, RN

## 2014-08-23 NOTE — Discharge Instructions (Signed)
Inpatient Rehab Discharge Instructions  Catherine SpeckingBarbara P [name] Discharge date and time: 08/23/14   Activities/Precautions/ Functional Status: Activity: activity as tolerated Diet: diabetic diet Wound Care: none needed  Functional status:  ___ No restrictions     ___ Walk up steps independently ___ 24/7 supervision/assistance   ___ Walk up steps with assistance _X__ Intermittent supervision/assistance  ___ Bathe/dress independently ___ Walk with walker     ___ Bathe/dress with assistance _X__ Walk Independently    _X__ Shower independently ___ Walk with assistance    ___ Shower with assistance _X__ No alcohol     ___ Return to work/school ________  Special Instructions: 1. DO NOT use Kombiglyze or Prinizide. 2. Check blood sugars twice a day before meals. 3. Drink plenty of fluids daily.   My questions have been answered and I understand these instructions. I will adhere to these goals and the provided educational materials after my discharge from the hospital.  Patient/Caregiver Signature _______________________________ Date __________  Clinician Signature _______________________________________ Date __________  Please bring this form and your medication list with you to all your follow-up doctor's appointments.

## 2014-08-23 NOTE — Progress Notes (Signed)
Patient and family received discharge instructions from Pam Love, PA-C with verbal understanding. Patient discharged to home with family and belongings. 

## 2014-08-23 NOTE — Progress Notes (Signed)
Patient is sleeping at this time will hold giving 0200 300cc of water.  Diamantina MonksARPENTER,Warrick Llera, RN

## 2014-08-31 DIAGNOSIS — Z09 Encounter for follow-up examination after completed treatment for conditions other than malignant neoplasm: Secondary | ICD-10-CM | POA: Diagnosis not present

## 2014-08-31 DIAGNOSIS — E1165 Type 2 diabetes mellitus with hyperglycemia: Secondary | ICD-10-CM | POA: Diagnosis not present

## 2014-08-31 DIAGNOSIS — I10 Essential (primary) hypertension: Secondary | ICD-10-CM | POA: Diagnosis not present

## 2014-09-18 ENCOUNTER — Other Ambulatory Visit: Payer: Self-pay | Admitting: Physical Medicine and Rehabilitation

## 2014-09-19 ENCOUNTER — Other Ambulatory Visit: Payer: Self-pay | Admitting: Physical Medicine and Rehabilitation

## 2014-09-20 ENCOUNTER — Other Ambulatory Visit: Payer: Self-pay | Admitting: Physical Medicine and Rehabilitation

## 2014-09-20 DIAGNOSIS — E119 Type 2 diabetes mellitus without complications: Secondary | ICD-10-CM | POA: Diagnosis not present

## 2014-09-20 DIAGNOSIS — I10 Essential (primary) hypertension: Secondary | ICD-10-CM | POA: Diagnosis not present

## 2014-09-21 ENCOUNTER — Other Ambulatory Visit: Payer: Self-pay | Admitting: Physical Medicine and Rehabilitation

## 2014-10-19 DIAGNOSIS — E890 Postprocedural hypothyroidism: Secondary | ICD-10-CM | POA: Diagnosis not present

## 2014-10-19 DIAGNOSIS — I10 Essential (primary) hypertension: Secondary | ICD-10-CM | POA: Diagnosis not present

## 2014-10-22 DIAGNOSIS — E890 Postprocedural hypothyroidism: Secondary | ICD-10-CM | POA: Diagnosis not present

## 2014-12-04 DIAGNOSIS — H2513 Age-related nuclear cataract, bilateral: Secondary | ICD-10-CM | POA: Diagnosis not present

## 2014-12-27 DIAGNOSIS — E890 Postprocedural hypothyroidism: Secondary | ICD-10-CM | POA: Diagnosis not present

## 2015-01-29 DIAGNOSIS — E1122 Type 2 diabetes mellitus with diabetic chronic kidney disease: Secondary | ICD-10-CM | POA: Diagnosis not present

## 2015-01-29 DIAGNOSIS — N183 Chronic kidney disease, stage 3 (moderate): Secondary | ICD-10-CM | POA: Diagnosis not present

## 2015-01-29 DIAGNOSIS — N959 Unspecified menopausal and perimenopausal disorder: Secondary | ICD-10-CM | POA: Diagnosis not present

## 2015-01-29 DIAGNOSIS — N08 Glomerular disorders in diseases classified elsewhere: Secondary | ICD-10-CM | POA: Diagnosis not present

## 2015-01-29 DIAGNOSIS — I129 Hypertensive chronic kidney disease with stage 1 through stage 4 chronic kidney disease, or unspecified chronic kidney disease: Secondary | ICD-10-CM | POA: Diagnosis not present

## 2015-05-10 IMAGING — CR DG CHEST 1V PORT
1 series · 1 of 1 positions shown · non-contrast
Comparison: None.

CLINICAL DATA: Fever and cough ; hypoxia

EXAM:
PORTABLE CHEST - 1 VIEW

[AP]
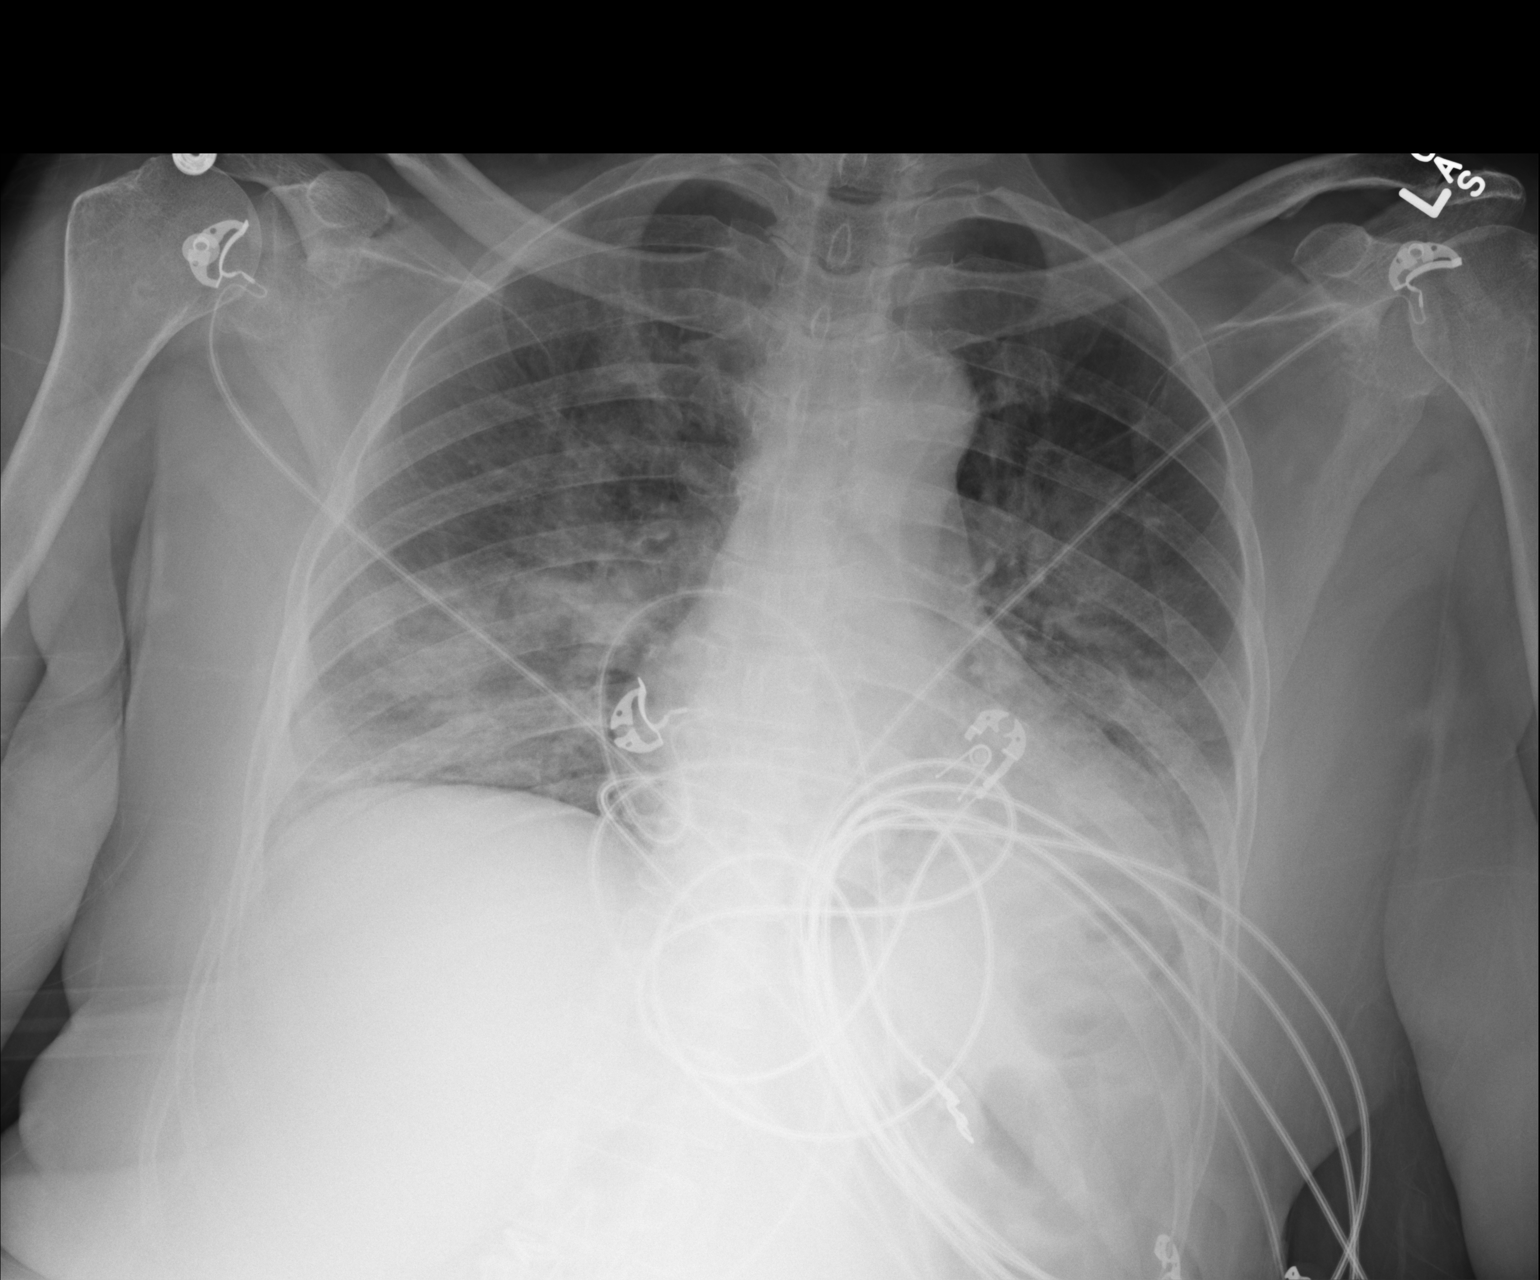

[1 of 1 positions shown; findings below may reference images not displayed]

FINDINGS: There is patchy alveolar opacity in both mid and lower lung zones.
Heart is upper normal in size with pulmonary vascularity within
normal limits. No effusions. No adenopathy. No bone lesions.
IMPRESSION: Bilateral mid and lower lung zone opacity. Suspect bilateral
pneumonia given the appearance, although a slightly atypical
appearance of congestive heart failure could present in this manner.
Both entities may exist concurrently. No adenopathy apparent.

## 2015-05-13 IMAGING — CR DG CHEST 2V
2 series · 2 of 2 positions shown · non-contrast
Comparison: 08/13/2014.

CLINICAL DATA: Increasing lethargy and cough.

EXAM:
CHEST  2 VIEW

[w chest pa]
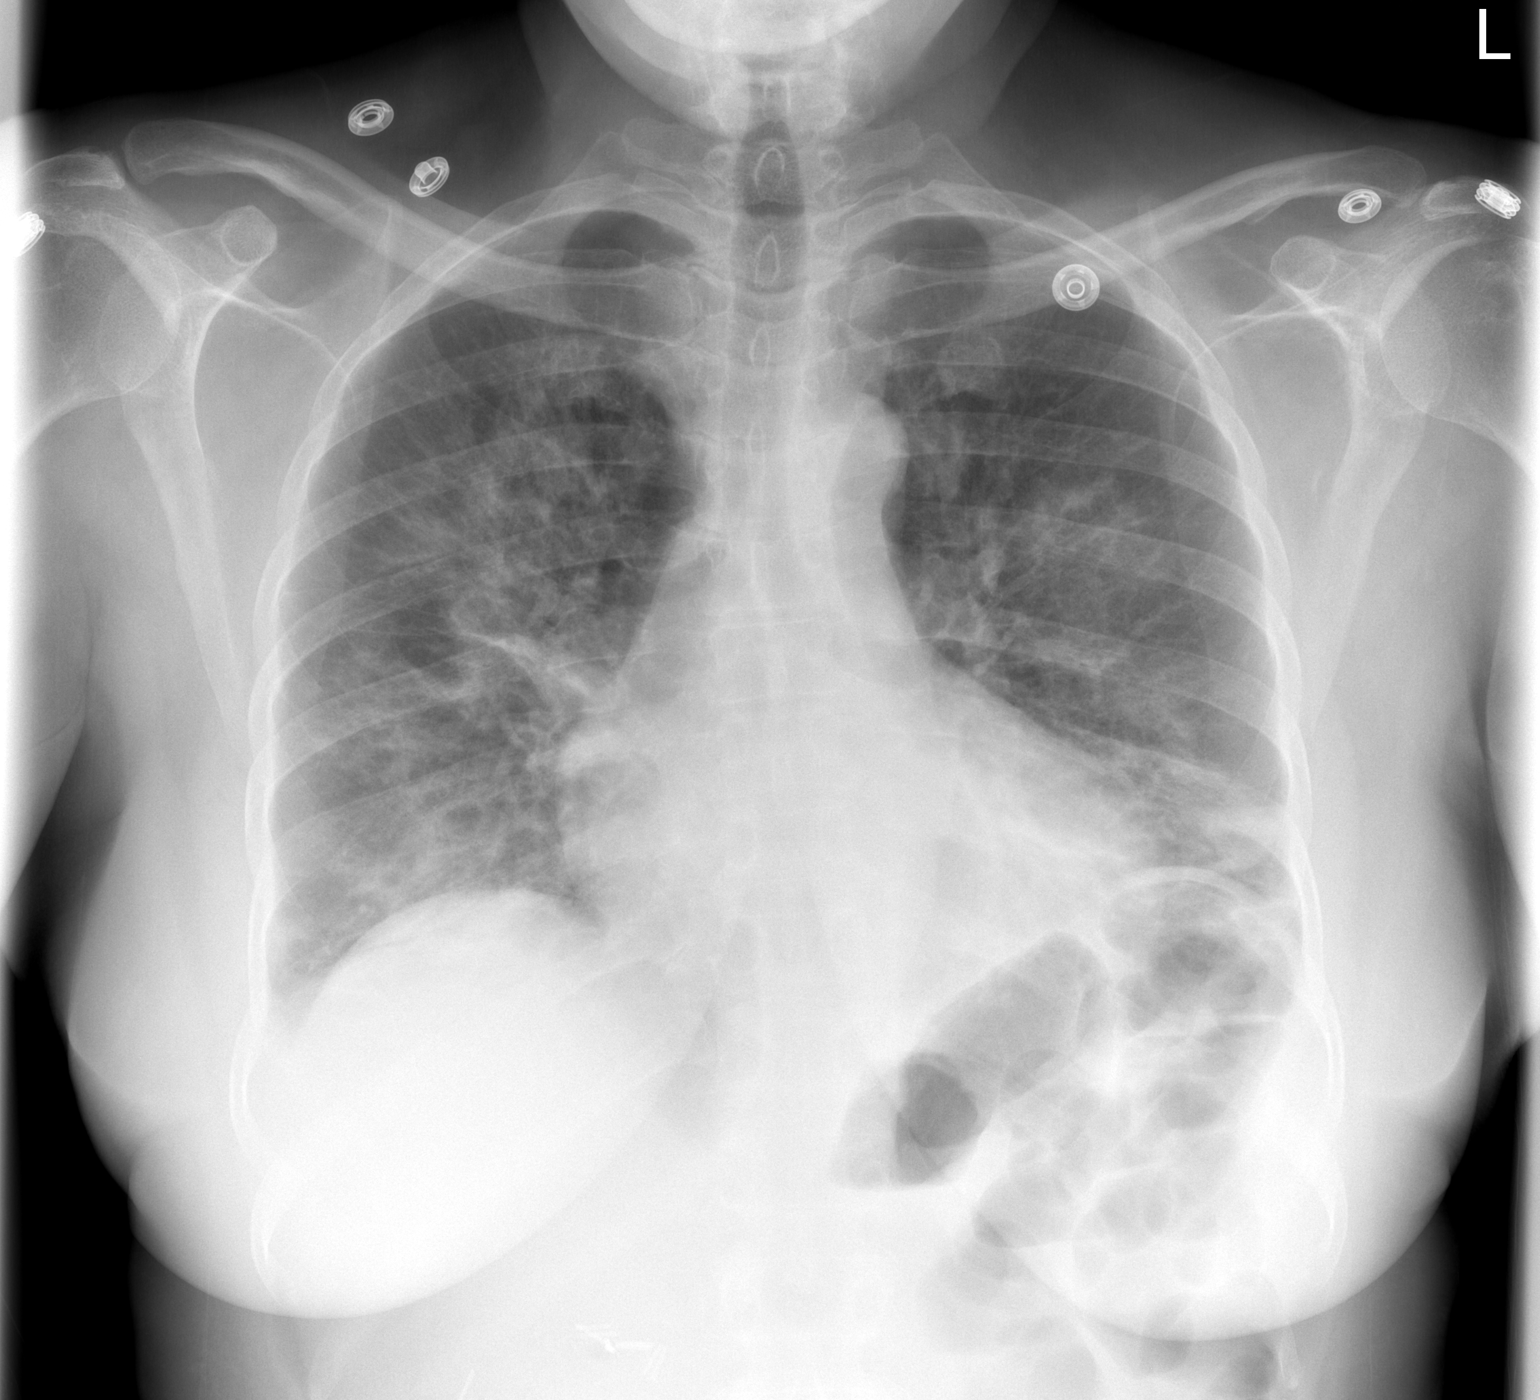

[w chest lat]
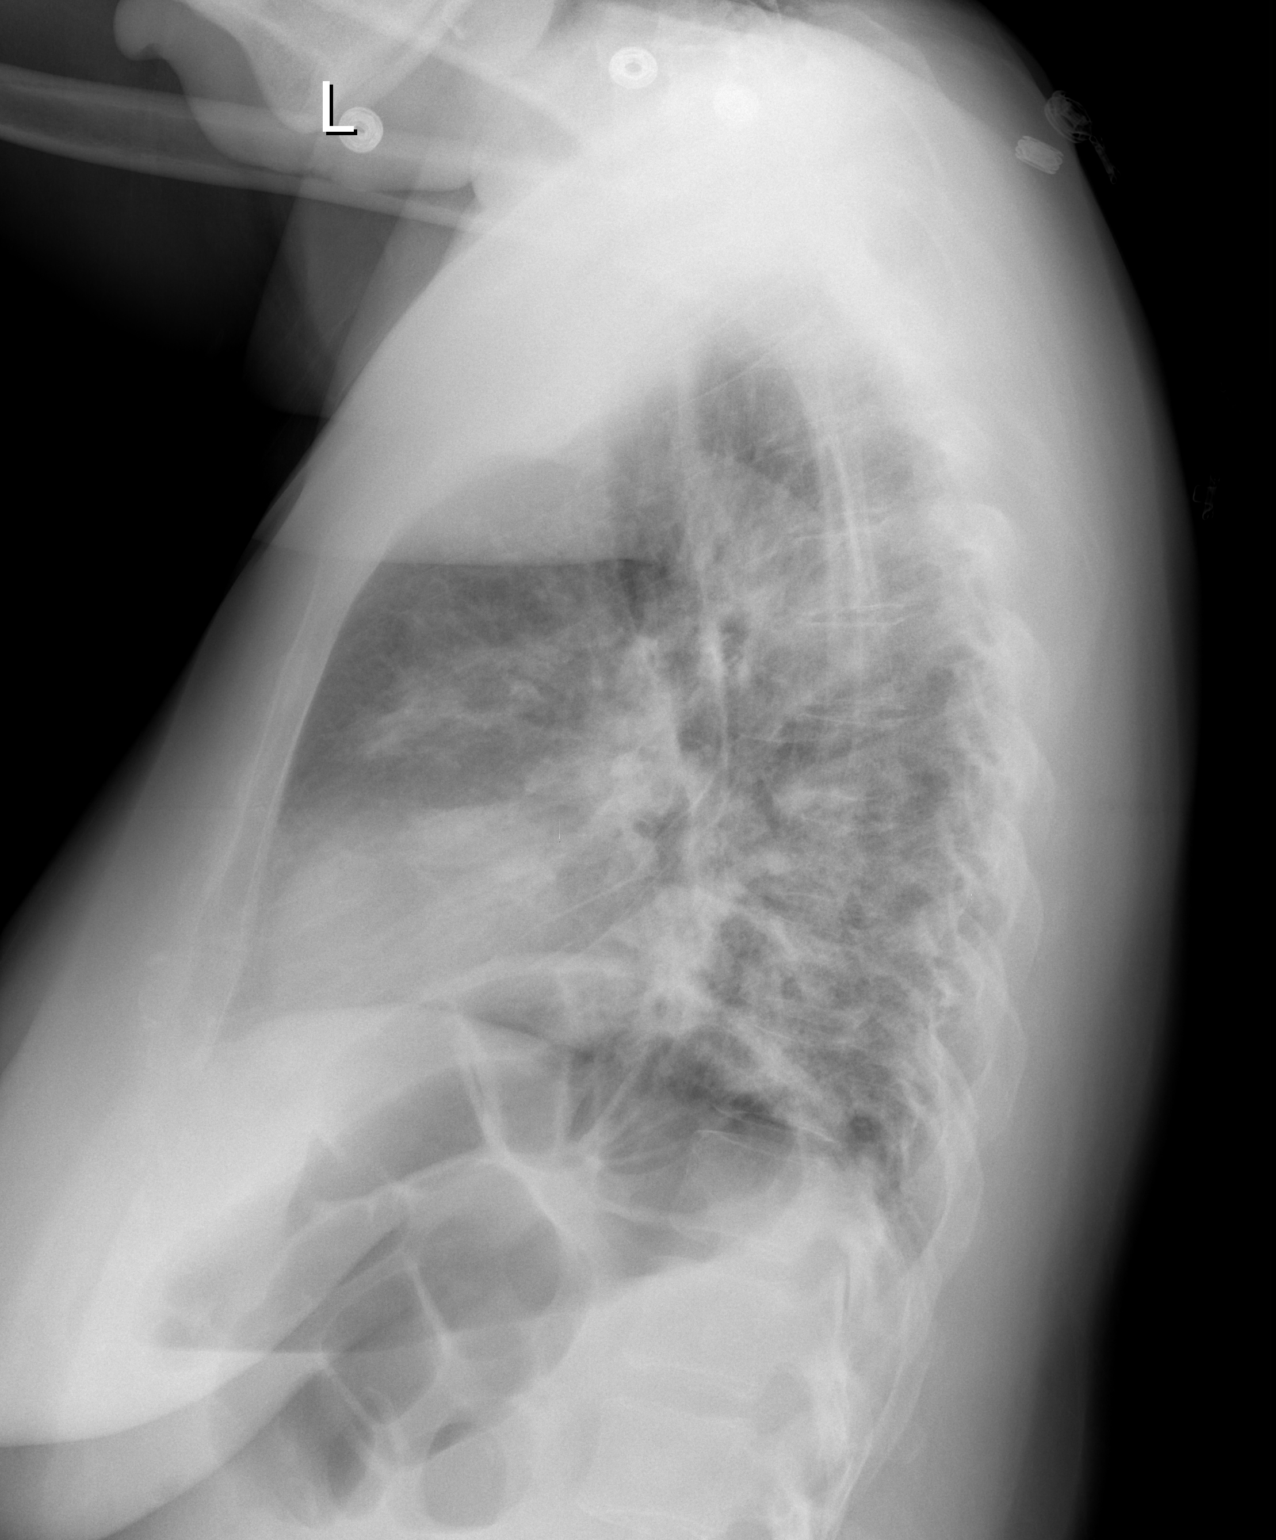

[2 of 2 positions shown; findings below may reference images not displayed]

FINDINGS: Heart size is mildly enlarged. No pleural effusion identified.
Bilateral airspace opacities are identified. When compared with the
previous exam there is worsening aeration to the right upper lobe.
IMPRESSION: 1. Bilateral airspace opacities compatible with multifocal
pneumonia.
2. Worsening aeration to the right upper lobe.

## 2015-06-04 DIAGNOSIS — E119 Type 2 diabetes mellitus without complications: Secondary | ICD-10-CM | POA: Diagnosis not present

## 2015-06-04 DIAGNOSIS — H2513 Age-related nuclear cataract, bilateral: Secondary | ICD-10-CM | POA: Diagnosis not present

## 2015-06-04 DIAGNOSIS — H35033 Hypertensive retinopathy, bilateral: Secondary | ICD-10-CM | POA: Diagnosis not present

## 2015-08-12 DIAGNOSIS — I10 Essential (primary) hypertension: Secondary | ICD-10-CM | POA: Diagnosis not present

## 2015-08-12 DIAGNOSIS — I129 Hypertensive chronic kidney disease with stage 1 through stage 4 chronic kidney disease, or unspecified chronic kidney disease: Secondary | ICD-10-CM | POA: Diagnosis not present

## 2015-08-12 DIAGNOSIS — E1165 Type 2 diabetes mellitus with hyperglycemia: Secondary | ICD-10-CM | POA: Diagnosis not present

## 2015-08-12 DIAGNOSIS — Z79899 Other long term (current) drug therapy: Secondary | ICD-10-CM | POA: Diagnosis not present

## 2015-08-12 DIAGNOSIS — E1122 Type 2 diabetes mellitus with diabetic chronic kidney disease: Secondary | ICD-10-CM | POA: Diagnosis not present

## 2015-08-12 DIAGNOSIS — Z13820 Encounter for screening for osteoporosis: Secondary | ICD-10-CM | POA: Diagnosis not present

## 2015-08-12 DIAGNOSIS — N08 Glomerular disorders in diseases classified elsewhere: Secondary | ICD-10-CM | POA: Diagnosis not present

## 2015-08-12 DIAGNOSIS — N183 Chronic kidney disease, stage 3 (moderate): Secondary | ICD-10-CM | POA: Diagnosis not present

## 2015-08-13 DIAGNOSIS — N183 Chronic kidney disease, stage 3 (moderate): Secondary | ICD-10-CM | POA: Diagnosis not present

## 2015-08-13 DIAGNOSIS — I129 Hypertensive chronic kidney disease with stage 1 through stage 4 chronic kidney disease, or unspecified chronic kidney disease: Secondary | ICD-10-CM | POA: Diagnosis not present

## 2015-08-13 DIAGNOSIS — E559 Vitamin D deficiency, unspecified: Secondary | ICD-10-CM | POA: Diagnosis not present

## 2015-08-13 DIAGNOSIS — E1122 Type 2 diabetes mellitus with diabetic chronic kidney disease: Secondary | ICD-10-CM | POA: Diagnosis not present

## 2015-08-13 DIAGNOSIS — Z0000 Encounter for general adult medical examination without abnormal findings: Secondary | ICD-10-CM | POA: Diagnosis not present

## 2015-08-13 DIAGNOSIS — Z23 Encounter for immunization: Secondary | ICD-10-CM | POA: Diagnosis not present

## 2015-08-29 DIAGNOSIS — R922 Inconclusive mammogram: Secondary | ICD-10-CM | POA: Diagnosis not present

## 2015-08-29 DIAGNOSIS — R928 Other abnormal and inconclusive findings on diagnostic imaging of breast: Secondary | ICD-10-CM | POA: Diagnosis not present

## 2015-08-29 DIAGNOSIS — Z1231 Encounter for screening mammogram for malignant neoplasm of breast: Secondary | ICD-10-CM | POA: Diagnosis not present

## 2015-09-04 DIAGNOSIS — R922 Inconclusive mammogram: Secondary | ICD-10-CM | POA: Diagnosis not present

## 2015-09-04 DIAGNOSIS — Z1231 Encounter for screening mammogram for malignant neoplasm of breast: Secondary | ICD-10-CM | POA: Diagnosis not present

## 2015-09-04 DIAGNOSIS — R928 Other abnormal and inconclusive findings on diagnostic imaging of breast: Secondary | ICD-10-CM | POA: Diagnosis not present

## 2015-10-14 DIAGNOSIS — E890 Postprocedural hypothyroidism: Secondary | ICD-10-CM | POA: Diagnosis not present

## 2015-10-21 DIAGNOSIS — E1165 Type 2 diabetes mellitus with hyperglycemia: Secondary | ICD-10-CM | POA: Diagnosis not present

## 2015-10-21 DIAGNOSIS — I10 Essential (primary) hypertension: Secondary | ICD-10-CM | POA: Diagnosis not present

## 2015-10-21 DIAGNOSIS — E890 Postprocedural hypothyroidism: Secondary | ICD-10-CM | POA: Diagnosis not present

## 2015-12-02 DIAGNOSIS — E1165 Type 2 diabetes mellitus with hyperglycemia: Secondary | ICD-10-CM | POA: Diagnosis not present

## 2015-12-02 DIAGNOSIS — E890 Postprocedural hypothyroidism: Secondary | ICD-10-CM | POA: Diagnosis not present

## 2015-12-03 DIAGNOSIS — I10 Essential (primary) hypertension: Secondary | ICD-10-CM | POA: Diagnosis not present

## 2015-12-03 DIAGNOSIS — E7800 Pure hypercholesterolemia, unspecified: Secondary | ICD-10-CM | POA: Diagnosis not present

## 2015-12-03 DIAGNOSIS — E890 Postprocedural hypothyroidism: Secondary | ICD-10-CM | POA: Diagnosis not present

## 2015-12-03 DIAGNOSIS — E1165 Type 2 diabetes mellitus with hyperglycemia: Secondary | ICD-10-CM | POA: Diagnosis not present

## 2016-01-16 DIAGNOSIS — K5900 Constipation, unspecified: Secondary | ICD-10-CM | POA: Diagnosis not present

## 2016-01-16 DIAGNOSIS — D509 Iron deficiency anemia, unspecified: Secondary | ICD-10-CM | POA: Diagnosis not present

## 2016-01-16 DIAGNOSIS — K625 Hemorrhage of anus and rectum: Secondary | ICD-10-CM | POA: Diagnosis not present

## 2016-01-16 DIAGNOSIS — Z1211 Encounter for screening for malignant neoplasm of colon: Secondary | ICD-10-CM | POA: Diagnosis not present

## 2016-02-10 DIAGNOSIS — I129 Hypertensive chronic kidney disease with stage 1 through stage 4 chronic kidney disease, or unspecified chronic kidney disease: Secondary | ICD-10-CM | POA: Diagnosis not present

## 2016-02-10 DIAGNOSIS — E1165 Type 2 diabetes mellitus with hyperglycemia: Secondary | ICD-10-CM | POA: Diagnosis not present

## 2016-02-10 DIAGNOSIS — E782 Mixed hyperlipidemia: Secondary | ICD-10-CM | POA: Diagnosis not present

## 2016-02-10 DIAGNOSIS — N183 Chronic kidney disease, stage 3 (moderate): Secondary | ICD-10-CM | POA: Diagnosis not present

## 2016-02-10 DIAGNOSIS — E039 Hypothyroidism, unspecified: Secondary | ICD-10-CM | POA: Diagnosis not present

## 2016-02-26 DIAGNOSIS — Z1211 Encounter for screening for malignant neoplasm of colon: Secondary | ICD-10-CM | POA: Diagnosis not present

## 2016-02-26 DIAGNOSIS — K621 Rectal polyp: Secondary | ICD-10-CM | POA: Diagnosis not present

## 2016-02-26 DIAGNOSIS — K6389 Other specified diseases of intestine: Secondary | ICD-10-CM | POA: Diagnosis not present

## 2016-02-26 DIAGNOSIS — D509 Iron deficiency anemia, unspecified: Secondary | ICD-10-CM | POA: Diagnosis not present

## 2016-03-05 DIAGNOSIS — Z09 Encounter for follow-up examination after completed treatment for conditions other than malignant neoplasm: Secondary | ICD-10-CM | POA: Diagnosis not present

## 2016-03-05 DIAGNOSIS — N6489 Other specified disorders of breast: Secondary | ICD-10-CM | POA: Diagnosis not present

## 2016-03-12 DIAGNOSIS — E669 Obesity, unspecified: Secondary | ICD-10-CM | POA: Diagnosis not present

## 2016-03-12 DIAGNOSIS — K5900 Constipation, unspecified: Secondary | ICD-10-CM | POA: Diagnosis not present

## 2016-03-12 DIAGNOSIS — D509 Iron deficiency anemia, unspecified: Secondary | ICD-10-CM | POA: Diagnosis not present

## 2016-04-02 DIAGNOSIS — E7800 Pure hypercholesterolemia, unspecified: Secondary | ICD-10-CM | POA: Diagnosis not present

## 2016-04-02 DIAGNOSIS — E1165 Type 2 diabetes mellitus with hyperglycemia: Secondary | ICD-10-CM | POA: Diagnosis not present

## 2016-04-02 DIAGNOSIS — E890 Postprocedural hypothyroidism: Secondary | ICD-10-CM | POA: Diagnosis not present

## 2016-04-09 DIAGNOSIS — E7800 Pure hypercholesterolemia, unspecified: Secondary | ICD-10-CM | POA: Diagnosis not present

## 2016-04-09 DIAGNOSIS — I10 Essential (primary) hypertension: Secondary | ICD-10-CM | POA: Diagnosis not present

## 2016-04-09 DIAGNOSIS — E1165 Type 2 diabetes mellitus with hyperglycemia: Secondary | ICD-10-CM | POA: Diagnosis not present

## 2016-04-09 DIAGNOSIS — E890 Postprocedural hypothyroidism: Secondary | ICD-10-CM | POA: Diagnosis not present

## 2016-06-09 DIAGNOSIS — E7800 Pure hypercholesterolemia, unspecified: Secondary | ICD-10-CM | POA: Diagnosis not present

## 2016-08-10 DIAGNOSIS — I129 Hypertensive chronic kidney disease with stage 1 through stage 4 chronic kidney disease, or unspecified chronic kidney disease: Secondary | ICD-10-CM | POA: Diagnosis not present

## 2016-08-10 DIAGNOSIS — E559 Vitamin D deficiency, unspecified: Secondary | ICD-10-CM | POA: Diagnosis not present

## 2016-08-10 DIAGNOSIS — E039 Hypothyroidism, unspecified: Secondary | ICD-10-CM | POA: Diagnosis not present

## 2016-08-10 DIAGNOSIS — Z0000 Encounter for general adult medical examination without abnormal findings: Secondary | ICD-10-CM | POA: Diagnosis not present

## 2016-08-10 DIAGNOSIS — E782 Mixed hyperlipidemia: Secondary | ICD-10-CM | POA: Diagnosis not present

## 2016-08-10 DIAGNOSIS — E1165 Type 2 diabetes mellitus with hyperglycemia: Secondary | ICD-10-CM | POA: Diagnosis not present

## 2016-08-10 DIAGNOSIS — Z23 Encounter for immunization: Secondary | ICD-10-CM | POA: Diagnosis not present

## 2016-08-18 DIAGNOSIS — H35033 Hypertensive retinopathy, bilateral: Secondary | ICD-10-CM | POA: Diagnosis not present

## 2016-09-08 DIAGNOSIS — N6489 Other specified disorders of breast: Secondary | ICD-10-CM | POA: Diagnosis not present

## 2016-09-29 DIAGNOSIS — E890 Postprocedural hypothyroidism: Secondary | ICD-10-CM | POA: Diagnosis not present

## 2016-09-29 DIAGNOSIS — E7800 Pure hypercholesterolemia, unspecified: Secondary | ICD-10-CM | POA: Diagnosis not present

## 2016-09-29 DIAGNOSIS — E1165 Type 2 diabetes mellitus with hyperglycemia: Secondary | ICD-10-CM | POA: Diagnosis not present

## 2016-10-06 DIAGNOSIS — E1165 Type 2 diabetes mellitus with hyperglycemia: Secondary | ICD-10-CM | POA: Diagnosis not present

## 2016-10-06 DIAGNOSIS — E890 Postprocedural hypothyroidism: Secondary | ICD-10-CM | POA: Diagnosis not present

## 2016-10-06 DIAGNOSIS — E7800 Pure hypercholesterolemia, unspecified: Secondary | ICD-10-CM | POA: Diagnosis not present

## 2016-10-06 DIAGNOSIS — I10 Essential (primary) hypertension: Secondary | ICD-10-CM | POA: Diagnosis not present

## 2017-02-02 DIAGNOSIS — E1165 Type 2 diabetes mellitus with hyperglycemia: Secondary | ICD-10-CM | POA: Diagnosis not present

## 2017-02-02 DIAGNOSIS — E890 Postprocedural hypothyroidism: Secondary | ICD-10-CM | POA: Diagnosis not present

## 2017-02-02 DIAGNOSIS — E7800 Pure hypercholesterolemia, unspecified: Secondary | ICD-10-CM | POA: Diagnosis not present

## 2017-02-02 DIAGNOSIS — I10 Essential (primary) hypertension: Secondary | ICD-10-CM | POA: Diagnosis not present

## 2017-02-08 DIAGNOSIS — N183 Chronic kidney disease, stage 3 (moderate): Secondary | ICD-10-CM | POA: Diagnosis not present

## 2017-02-08 DIAGNOSIS — E039 Hypothyroidism, unspecified: Secondary | ICD-10-CM | POA: Diagnosis not present

## 2017-02-08 DIAGNOSIS — E782 Mixed hyperlipidemia: Secondary | ICD-10-CM | POA: Diagnosis not present

## 2017-02-08 DIAGNOSIS — E1165 Type 2 diabetes mellitus with hyperglycemia: Secondary | ICD-10-CM | POA: Diagnosis not present

## 2017-02-08 DIAGNOSIS — I129 Hypertensive chronic kidney disease with stage 1 through stage 4 chronic kidney disease, or unspecified chronic kidney disease: Secondary | ICD-10-CM | POA: Diagnosis not present

## 2017-03-16 ENCOUNTER — Encounter: Payer: Self-pay | Admitting: Registered"

## 2017-03-16 ENCOUNTER — Encounter: Payer: Medicare Other | Attending: Internal Medicine | Admitting: Registered"

## 2017-03-16 DIAGNOSIS — E119 Type 2 diabetes mellitus without complications: Secondary | ICD-10-CM | POA: Insufficient documentation

## 2017-03-16 DIAGNOSIS — Z713 Dietary counseling and surveillance: Secondary | ICD-10-CM | POA: Diagnosis not present

## 2017-03-16 DIAGNOSIS — E118 Type 2 diabetes mellitus with unspecified complications: Secondary | ICD-10-CM

## 2017-03-16 NOTE — Patient Instructions (Addendum)
Strategies Dawn Phenomenon - high blood sugar in the morning. Avoid carbohydrates at bedtime. Talk to your doctor Penelope Galas, [place]) who may want to - adjust your dose of medication - MD may change the time when you take your medication from dinnertime to bedtime.  Stress management, Consider talking to a counselor about stressful situations such as TV in bedroom.  Make a reminder note - Things I need to do for [facility] Consider getting a gym membership increasing physical activity work up to 30 min day. Can start with small steps. Remember to distinguish between what things we can control and what we can't.  Plan:  Aim for 2-3 Carb Choices per meal (30-45 grams) +/- 1 either way  Aim for 0-1 Carbs per snack if hungry  Include protein in moderation with your meals and snacks Consider reading food labels for Total Carbohydrate  Continue checking BG at alternate times per day as directed by MD  Continue taking medication as directed by MD

## 2017-03-16 NOTE — Progress Notes (Signed)
Diabetes Self-Management Education  Visit Type: First/Initial  Appt. Start Time: 1035 Appt. End Time: 1150  03/16/2017  Ms. Catherine [name], identified by name and date of birth, is a 71 y.o. female with a diagnosis of Diabetes: Type 2.   ASSESSMENT Pt states she is frustrated when she sees a high fasting BG in the morning. Pt had started following a meal plan included with a weight loss exercise video she has which is basically a low carb and says no fruit after 3 pm.  Pt states both her husband and son have diabetes. Pt is frustrated that spouse complains about meal changes she has made in attempt to improve the family's health.  Height 5' 4.75" (1.645 m), weight 188 lb 11.2 oz (85.6 kg). Body mass index is 31.64 kg/m.      Diabetes Self-Management Education - 03/16/17 1053      Visit Information   Visit Type First/Initial     Initial Visit   Diabetes Type Type 2   Are you currently following a meal plan? Yes   What type of meal plan do you follow? 1300 calorie   Are you taking your medications as prescribed? Yes  metformin 1000, glimipiride   Date Diagnosed 2000     Health Coping   How would you rate your overall health? Good     Psychosocial Assessment   Patient Belief/Attitude about Diabetes Motivated to manage diabetes   How often do you need to have someone help you when you read instructions, pamphlets, or other written materials from your doctor or pharmacy? 3 - Sometimes   What is the last grade level you completed in school? 12     Complications   Last HgB A1C per patient/outside source 7.4 %   How often do you check your blood sugar? 1-2 times/day  fasting am and before bed   Fasting Blood glucose range (mg/dL) 130-179   Postprandial Blood glucose range (mg/dL) 130-179  4 hrs post prandial   Number of hypoglycemic episodes per month 0   Number of hyperglycemic episodes per week 0   Have you had a dilated eye exam in the past 12 months? Yes   Have you had  a dental exam in the past 12 months? Yes   Are you checking your feet? Yes   How many days per week are you checking your feet? 7     Dietary Intake   Breakfast oatmeal OR Tbs grits, eggs, bacon, coffee sugarfree creamer,   cut back on oatmeal d/t wt loss literature she found   Snack (morning) fruit  10:30 - 11 am   Lunch [employer] [place] [employer], no sides or drink  1 or 2 pm   Snack (afternoon) none OR if fruit sitting out    Dinner fried fish OR pork chops OR ground beef dishes, mac & cheese or potatoes, veggies   Snack (evening) none   Beverage(s) coffee, water, green tea, black tea     Exercise   Exercise Type Light (walking / raking leaves)   How many days per week to you exercise? 3   How many minutes per day do you exercise? 15   Total minutes per week of exercise 45     Patient Education   Previous Diabetes Education Yes (please comment)  2000 at [place]   Nutrition management  Role of diet in the treatment of diabetes and the relationship between the three main macronutrients and blood glucose level;Carbohydrate counting  Physical activity and exercise  Role of exercise on diabetes management, blood pressure control and cardiac health.   Monitoring Identified appropriate SMBG and/or A1C goals.   Psychosocial adjustment Role of stress on diabetes;Worked with patient to identify barriers to care and solutions;Helped patient identify a support system for diabetes management;Identified and addressed patients feelings and concerns about diabetes;Brainstormed with patient on coping mechanisms for social situations, getting support from significant others, dealing with feelings about diabetes     Individualized Goals (developed by patient)   Nutrition General guidelines for healthy choices and portions discussed   Physical Activity 30 minutes per day     Outcomes   Expected Outcomes Demonstrated interest in learning. Expect positive outcomes   Future DMSE PRN    Program Status Completed      Individualized Plan for Diabetes Self-Management Training:   Learning Objective:  Patient will have a greater understanding of diabetes self-management. Patient education plan is to attend individual and/or group sessions per assessed needs and concerns.   Patient Instructions  Strategies Dawn Phenomenon - high blood sugar in the morning. Avoid carbohydrates at bedtime. Talk to your doctor Darleen Crocker, [place]) who may want to - adjust your dose of medication - MD may change the time when you take your medication from dinnertime to bedtime.  Stress management, Consider talking to a counselor about stressful situations such as TV in bedroom.  Make a reminder note - Things I need to do for [facility] Consider getting a gym membership increasing physical activity work up to 30 min day. Can start with small steps. Remember to distinguish between what things we can control and what we can't.  Plan:  Aim for 2-3 Carb Choices per meal (30-45 grams) +/- 1 either way  Aim for 0-1 Carbs per snack if hungry  Include protein in moderation with your meals and snacks Consider reading food labels for Total Carbohydrate  Continue checking BG at alternate times per day as directed by MD  Continue taking medication as directed by MD   Expected Outcomes:  Demonstrated interest in learning. Expect positive outcomes  Education material provided: A1C conversion sheet, My Plate and Carbohydrate counting sheet  If problems or questions, patient to contact team via:  Phone, my chart  Future DSME appointment: PRN

## 2017-05-13 DIAGNOSIS — I129 Hypertensive chronic kidney disease with stage 1 through stage 4 chronic kidney disease, or unspecified chronic kidney disease: Secondary | ICD-10-CM | POA: Diagnosis not present

## 2017-05-13 DIAGNOSIS — E782 Mixed hyperlipidemia: Secondary | ICD-10-CM | POA: Diagnosis not present

## 2017-05-13 DIAGNOSIS — E1165 Type 2 diabetes mellitus with hyperglycemia: Secondary | ICD-10-CM | POA: Diagnosis not present

## 2017-05-13 DIAGNOSIS — E039 Hypothyroidism, unspecified: Secondary | ICD-10-CM | POA: Diagnosis not present

## 2017-06-22 DIAGNOSIS — E7800 Pure hypercholesterolemia, unspecified: Secondary | ICD-10-CM | POA: Diagnosis not present

## 2017-06-22 DIAGNOSIS — I10 Essential (primary) hypertension: Secondary | ICD-10-CM | POA: Diagnosis not present

## 2017-06-22 DIAGNOSIS — E890 Postprocedural hypothyroidism: Secondary | ICD-10-CM | POA: Diagnosis not present

## 2017-06-22 DIAGNOSIS — E1165 Type 2 diabetes mellitus with hyperglycemia: Secondary | ICD-10-CM | POA: Diagnosis not present

## 2017-08-13 DIAGNOSIS — Z13820 Encounter for screening for osteoporosis: Secondary | ICD-10-CM | POA: Diagnosis not present

## 2017-08-13 DIAGNOSIS — I129 Hypertensive chronic kidney disease with stage 1 through stage 4 chronic kidney disease, or unspecified chronic kidney disease: Secondary | ICD-10-CM | POA: Diagnosis not present

## 2017-08-13 DIAGNOSIS — E559 Vitamin D deficiency, unspecified: Secondary | ICD-10-CM | POA: Diagnosis not present

## 2017-08-13 DIAGNOSIS — I10 Essential (primary) hypertension: Secondary | ICD-10-CM | POA: Diagnosis not present

## 2017-08-13 DIAGNOSIS — E890 Postprocedural hypothyroidism: Secondary | ICD-10-CM | POA: Diagnosis not present

## 2017-08-13 DIAGNOSIS — N183 Chronic kidney disease, stage 3 (moderate): Secondary | ICD-10-CM | POA: Diagnosis not present

## 2017-08-13 DIAGNOSIS — E1165 Type 2 diabetes mellitus with hyperglycemia: Secondary | ICD-10-CM | POA: Diagnosis not present

## 2017-08-13 DIAGNOSIS — Z23 Encounter for immunization: Secondary | ICD-10-CM | POA: Diagnosis not present

## 2017-08-13 DIAGNOSIS — E2839 Other primary ovarian failure: Secondary | ICD-10-CM | POA: Diagnosis not present

## 2017-08-13 DIAGNOSIS — Z0000 Encounter for general adult medical examination without abnormal findings: Secondary | ICD-10-CM | POA: Diagnosis not present

## 2017-08-13 DIAGNOSIS — N08 Glomerular disorders in diseases classified elsewhere: Secondary | ICD-10-CM | POA: Diagnosis not present

## 2017-09-09 DIAGNOSIS — Z1231 Encounter for screening mammogram for malignant neoplasm of breast: Secondary | ICD-10-CM | POA: Diagnosis not present

## 2017-09-09 DIAGNOSIS — Z780 Asymptomatic menopausal state: Secondary | ICD-10-CM | POA: Diagnosis not present

## 2017-09-14 ENCOUNTER — Telehealth: Payer: Self-pay

## 2017-09-14 DIAGNOSIS — N6489 Other specified disorders of breast: Secondary | ICD-10-CM | POA: Diagnosis not present

## 2017-09-14 DIAGNOSIS — R922 Inconclusive mammogram: Secondary | ICD-10-CM | POA: Diagnosis not present

## 2017-09-14 NOTE — Telephone Encounter (Signed)
ERROR

## 2017-09-29 DIAGNOSIS — E039 Hypothyroidism, unspecified: Secondary | ICD-10-CM | POA: Diagnosis not present

## 2017-10-21 DIAGNOSIS — E890 Postprocedural hypothyroidism: Secondary | ICD-10-CM | POA: Diagnosis not present

## 2017-10-21 DIAGNOSIS — E1165 Type 2 diabetes mellitus with hyperglycemia: Secondary | ICD-10-CM | POA: Diagnosis not present

## 2017-10-21 DIAGNOSIS — E7800 Pure hypercholesterolemia, unspecified: Secondary | ICD-10-CM | POA: Diagnosis not present

## 2017-10-21 DIAGNOSIS — I10 Essential (primary) hypertension: Secondary | ICD-10-CM | POA: Diagnosis not present

## 2017-11-11 ENCOUNTER — Telehealth: Payer: Self-pay | Admitting: Hematology

## 2017-11-11 NOTE — Telephone Encounter (Signed)
Spoke with patient regarding appointment she is aware of D/T/Loc/Pho#

## 2017-11-12 DIAGNOSIS — E782 Mixed hyperlipidemia: Secondary | ICD-10-CM | POA: Diagnosis not present

## 2017-11-12 DIAGNOSIS — N183 Chronic kidney disease, stage 3 (moderate): Secondary | ICD-10-CM | POA: Diagnosis not present

## 2017-11-12 DIAGNOSIS — I129 Hypertensive chronic kidney disease with stage 1 through stage 4 chronic kidney disease, or unspecified chronic kidney disease: Secondary | ICD-10-CM | POA: Diagnosis not present

## 2017-11-12 DIAGNOSIS — E1165 Type 2 diabetes mellitus with hyperglycemia: Secondary | ICD-10-CM | POA: Diagnosis not present

## 2017-12-21 ENCOUNTER — Telehealth: Payer: Self-pay | Admitting: Hematology

## 2017-12-21 ENCOUNTER — Encounter: Payer: Self-pay | Admitting: Hematology

## 2017-12-21 ENCOUNTER — Inpatient Hospital Stay: Payer: Medicare Other | Attending: Hematology | Admitting: Hematology

## 2017-12-21 ENCOUNTER — Inpatient Hospital Stay: Payer: Medicare Other

## 2017-12-21 VITALS — BP 144/60 | HR 81 | Temp 98.7°F | Resp 18 | Ht 64.75 in | Wt 186.2 lb

## 2017-12-21 DIAGNOSIS — D72819 Decreased white blood cell count, unspecified: Secondary | ICD-10-CM | POA: Diagnosis not present

## 2017-12-21 DIAGNOSIS — E0590 Thyrotoxicosis, unspecified without thyrotoxic crisis or storm: Secondary | ICD-10-CM | POA: Diagnosis not present

## 2017-12-21 DIAGNOSIS — R600 Localized edema: Secondary | ICD-10-CM | POA: Diagnosis not present

## 2017-12-21 DIAGNOSIS — D649 Anemia, unspecified: Secondary | ICD-10-CM | POA: Insufficient documentation

## 2017-12-21 DIAGNOSIS — D573 Sickle-cell trait: Secondary | ICD-10-CM | POA: Diagnosis not present

## 2017-12-21 DIAGNOSIS — Z8701 Personal history of pneumonia (recurrent): Secondary | ICD-10-CM

## 2017-12-21 DIAGNOSIS — I129 Hypertensive chronic kidney disease with stage 1 through stage 4 chronic kidney disease, or unspecified chronic kidney disease: Secondary | ICD-10-CM | POA: Insufficient documentation

## 2017-12-21 DIAGNOSIS — Z7984 Long term (current) use of oral hypoglycemic drugs: Secondary | ICD-10-CM | POA: Diagnosis not present

## 2017-12-21 DIAGNOSIS — I10 Essential (primary) hypertension: Secondary | ICD-10-CM

## 2017-12-21 DIAGNOSIS — E1165 Type 2 diabetes mellitus with hyperglycemia: Secondary | ICD-10-CM

## 2017-12-21 DIAGNOSIS — Z79899 Other long term (current) drug therapy: Secondary | ICD-10-CM

## 2017-12-21 DIAGNOSIS — Z7982 Long term (current) use of aspirin: Secondary | ICD-10-CM | POA: Insufficient documentation

## 2017-12-21 DIAGNOSIS — N183 Chronic kidney disease, stage 3 (moderate): Secondary | ICD-10-CM | POA: Insufficient documentation

## 2017-12-21 LAB — LACTATE DEHYDROGENASE: LDH: 206 U/L (ref 125–245)

## 2017-12-21 LAB — CBC WITH DIFFERENTIAL (CANCER CENTER ONLY)
BASOS ABS: 0 10*3/uL (ref 0.0–0.1)
BASOS PCT: 1 %
EOS ABS: 0.2 10*3/uL (ref 0.0–0.5)
EOS PCT: 6 %
HCT: 31.5 % — ABNORMAL LOW (ref 34.8–46.6)
Hemoglobin: 10.9 g/dL — ABNORMAL LOW (ref 11.6–15.9)
Lymphocytes Relative: 25 %
Lymphs Abs: 0.9 10*3/uL (ref 0.9–3.3)
MCH: 29.6 pg (ref 25.1–34.0)
MCHC: 34.6 g/dL (ref 31.5–36.0)
MCV: 85.6 fL (ref 79.5–101.0)
Monocytes Absolute: 0.1 10*3/uL (ref 0.1–0.9)
Monocytes Relative: 3 %
Neutro Abs: 2.5 10*3/uL (ref 1.5–6.5)
Neutrophils Relative %: 65 %
Platelet Count: 210 10*3/uL (ref 145–400)
RBC: 3.68 MIL/uL — AB (ref 3.70–5.45)
RDW: 12.9 % (ref 11.2–14.5)
WBC Count: 3.8 10*3/uL — ABNORMAL LOW (ref 3.9–10.3)

## 2017-12-21 LAB — CMP (CANCER CENTER ONLY)
ALK PHOS: 78 U/L (ref 40–150)
ALT: 18 U/L (ref 0–55)
ANION GAP: 11 (ref 3–11)
AST: 18 U/L (ref 5–34)
Albumin: 4.4 g/dL (ref 3.5–5.0)
BUN: 37 mg/dL — ABNORMAL HIGH (ref 7–26)
CALCIUM: 9.9 mg/dL (ref 8.4–10.4)
CO2: 23 mmol/L (ref 22–29)
Chloride: 101 mmol/L (ref 98–109)
Creatinine: 1.79 mg/dL — ABNORMAL HIGH (ref 0.60–1.10)
GFR, EST AFRICAN AMERICAN: 32 mL/min — AB (ref >60)
GFR, Estimated: 28 mL/min — ABNORMAL LOW (ref >60)
Glucose, Bld: 322 mg/dL — ABNORMAL HIGH (ref 70–140)
Potassium: 4.9 mmol/L (ref 3.5–5.1)
SODIUM: 135 mmol/L — AB (ref 136–145)
Total Bilirubin: 0.5 mg/dL (ref 0.2–1.2)
Total Protein: 7.7 g/dL (ref 6.4–8.3)

## 2017-12-21 LAB — FERRITIN: Ferritin: 161 ng/mL (ref 9–269)

## 2017-12-21 LAB — RETICULOCYTES
RBC.: 3.68 MIL/uL — ABNORMAL LOW (ref 3.70–5.45)
RETIC COUNT ABSOLUTE: 92 10*3/uL — AB (ref 33.7–90.7)
RETIC CT PCT: 2.5 % — AB (ref 0.7–2.1)

## 2017-12-21 LAB — IRON AND TIBC
Iron: 73 ug/dL (ref 41–142)
SATURATION RATIOS: 24 % (ref 21–57)
TIBC: 309 ug/dL (ref 236–444)
UIBC: 236 ug/dL

## 2017-12-21 LAB — SAVE SMEAR

## 2017-12-21 LAB — VITAMIN B12: VITAMIN B 12: 3201 pg/mL — AB (ref 180–914)

## 2017-12-21 NOTE — Telephone Encounter (Signed)
Gave avs and calendar for february °

## 2017-12-21 NOTE — Patient Instructions (Signed)
Thank you for choosing [facility] to provide your oncology and hematology care.  To afford each patient quality time with our providers, please arrive 30 minutes before your scheduled appointment time.  If you arrive late for your appointment, you may be asked to reschedule.  We strive to give you quality time with our providers, and arriving late affects you and other patients whose appointments are after yours.   If you are a no show for multiple scheduled visits, you may be dismissed from the clinic at the providers discretion.    Again, thank you for choosing [facility], our hope is that these requests will decrease the amount of time that you wait before being seen by our physicians.  ______________________________________________________________________  Should you have questions after your visit to the  Cancer Center, please contact our office at [phone] between the hours of 8:30 and 4:30 p.m.    Voicemails left after 4:30p.m will not be returned until the following business day.    For prescription refill requests, please have your pharmacy contact us directly.  Please also try to allow 48 hours for prescription requests.    Please contact the scheduling department for questions regarding scheduling.  For scheduling of procedures such as PET scans, CT scans, MRI, Ultrasound, etc please contact central scheduling at [phone].    Resources For Cancer Patients and Caregivers:   Oncolink.org:  A wonderful resource for patients and healthcare providers for information regarding your disease, ways to tract your treatment, what to expect, etc.     American Cancer Society:  800-227-2345  Can help patients locate various types of support and financial assistance  Cancer Care: 1-800-813-HOPE (4673) Provides financial assistance, online support groups, medication/co-pay assistance.    [place] DSS:  336-641-3447 Where to apply for food  stamps, Medicaid, and utility assistance  Medicare Rights Center: 800-333-4114 Helps people with Medicare understand their rights and benefits, navigate the Medicare system, and secure the quality healthcare they deserve  SCAT: 336-333-6589 [employer] [employer]'s shared-ride transportation service for eligible riders who have a disability that prevents them from riding the fixed route bus.    For additional information on assistance programs please contact our social worker:   Grier Hock/Abigail Elmore:  [phone]            

## 2017-12-21 NOTE — Progress Notes (Signed)
HEMATOLOGY/ONCOLOGY CONSULTATION NOTE  Date of Service: 12/21/2017  Patient Care Team: Dorothyann Peng, MD as PCP - General (Internal Medicine)  CHIEF COMPLAINTS/PURPOSE OF CONSULTATION:  Leukocytopenia   HISTORY OF PRESENTING ILLNESS:   Catherine [name] is a wonderful 72 y.o. female who has been referred to [place] by PCP Dr. Allyne Gee, from [facility], for evaluation and management of leukocytopenia.   She presents to the clinic today accompanied by her husband. She was not aware of her low white count until late 2018. She denies frequent infections since her episode of pneumonia in 2015. She notes she had more recent blood work done but does not have the results.  She notes she was told she was anemic since she was young, but did not know the cause. She was only told 2 years ago that she carried the sickle cell trait. She has only been on oral iron, ferrous sulfate 1 tab once daily recently. She has been on B12 since her pneumonia in 2015. She was not told she was B12 deficient before.  She denies being on acid suppressants.   She has been on thyroid medication for her initial hyperthyroidism diagnosed 5 years ago. She was also treated with thyroid radiaiton in the past. She was then put on Synthroid which was increased in 2018. She is on tradjenta in 10/2017 for her DM, she is no longer taking insulin.   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 She does not smoke and does not drink.   On review of symptoms, pt notes having a chill most days, she notes mild weight loss. She notes having cramps when she does repetitious activity in her right shoulder and occasional right calf cramps at night. She notes her stool is black but she attributed that to her oral iron. No black stool prior to being on oral iron. She notes swelling in her hands. 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  She denies  abdominal pain, leg swelling.   MEDICAL HISTORY:  Past Medical History:  Diagnosis Date  . Diabetes mellitus without complication (HCC)   . Hypertension   . Thyroid disease     SURGICAL HISTORY: Past Surgical History:  Procedure Laterality Date  . CHOLECYSTECTOMY      SOCIAL HISTORY: Social History   Socioeconomic History  . Marital status: Married    Spouse name: Not on file  . Number of children: Not on file  . Years of education: Not on file  . Highest education level: Not on file  Social Needs  . Financial resource strain: Not on file  . Food insecurity - worry: Not on file  . Food insecurity - inability: Not on file  . Transportation needs - medical: Not on file  . Transportation needs - non-medical: Not on file  Occupational History  . Not on file  Tobacco Use  . Smoking status: Never Smoker  . Smokeless tobacco: Never Used  Substance and Sexual Activity  . Alcohol use: No  . Drug use: No  . Sexual activity: Not on file  Other Topics Concern  . Not on file  Social History Narrative  . Not on file    FAMILY HISTORY: Family History  Problem Relation Age of Onset  . Diabetes Mellitus II Mother   . Stroke Mother   . Diabetes Mellitus II Sister   . Hypertension Sister  ALLERGIES:  has No Known Allergies.  MEDICATIONS:  Current Outpatient Medications  Medication Sig Dispense Refill  . Ascorbic Acid (VITAMIN C) 1000 MG tablet Take 1,000 mg by mouth daily.    [name] Kitchen. aspirin 81 MG tablet Take 81 mg by mouth daily.    [name] Kitchen. atorvastatin (LIPITOR) 10 MG tablet Take 10 mg by mouth daily.    . beta carotene w/minerals (OCUVITE) tablet Take 1 tablet by mouth daily.    . calcium citrate-vitamin D (CITRACAL+D) 315-200 MG-UNIT tablet Take 1 tablet by mouth 2 (two) times daily.    . Cyanocobalamin (VITAMIN B 12 PO) Take 1,000 mg by mouth daily.    . ferrous sulfate 325 (65 FE) MG tablet Take 1 tablet (325 mg total) by mouth 2 (two) times daily with a meal. 60 tablet 0   . glimepiride (AMARYL) 4 MG tablet Take 1 tablet (4 mg total) by mouth daily with breakfast. 30 tablet 0  . levothyroxine (SYNTHROID, LEVOTHROID) 50 MCG tablet Take 50 mcg by mouth daily before breakfast.    . linagliptin (TRADJENTA) 5 MG TABS tablet Take 1 tablet (5 mg total) by mouth daily. For diabetes 30 tablet 0  . Multiple Vitamin (MULTIVITAMIN WITH MINERALS) TABS tablet Take 1 tablet by mouth daily.    . Omega-3 Fatty Acids (FISH OIL) 1000 MG CAPS Take by mouth.    . Wheat Dextrin (BENEFIBER DRINK MIX PO) Take by mouth.     No current facility-administered medications for this visit.     REVIEW OF SYSTEMS:    10 Point review of Systems was done is negative except as noted above.  PHYSICAL EXAMINATION: ECOG PERFORMANCE STATUS: 1 - Symptomatic but completely ambulatory  . Vitals:   12/21/17 1021  BP: (!) 144/60  Pulse: 81  Resp: 18  Temp: 98.7 F (37.1 C)  SpO2: 98%   Filed Weights   12/21/17 1021  Weight: 186 lb 3.2 oz (84.5 kg)   .Body mass index is 31.23 kg/m.  GENERAL:alert, in no acute distress and comfortable SKIN: no acute rashes, no significant lesions EYES: conjunctiva are pink and non-injected, sclera anicteric OROPHARYNX: MMM, no exudates, no oropharyngeal erythema or ulceration NECK: supple, no JVD LYMPH:  no palpable lymphadenopathy in the cervical, axillary or inguinal regions LUNGS: clear to auscultation b/l with normal respiratory effort HEART: regular rate & rhythm ABDOMEN:  normoactive bowel sounds , non tender, not distended. Extremity: no pedal edema PSYCH: alert & oriented x 3 with fluent speech NEURO: no focal motor/sensory deficits  LABORATORY DATA:  I have reviewed the data as listed  . CBC Latest Ref Rng & Units 12/21/2017 08/23/2014 08/22/2014  WBC 3.9 - 10.3 K/uL 3.8(L) - -  Hemoglobin 12.0 - 15.0 g/dL - 7.9(L) 8.2(L)  Hematocrit 34.8 - 46.6 % 31.5(L) 23.7(L) 25.0(L)  Platelets 145 - 400 K/uL 210 - -  hgb 10.9  . CMP Latest Ref  Rng & Units 12/21/2017 08/17/2014 08/16/2014  Glucose 70 - 140 mg/dL 161(W322(H) 960(A238(H) 540(J261(H)  BUN 7 - 26 mg/dL 81(X37(H) 15 19  Creatinine 0.60 - 1.10 mg/dL 9.14(N1.79(H) 8.290.97 5.620.96  Sodium 136 - 145 mmol/L 135(L) 140 143  Potassium 3.5 - 5.1 mmol/L 4.9 3.4(L) 3.5(L)  Chloride 98 - 109 mmol/L 101 106 108  CO2 22 - 29 mmol/L 23 22 22   Calcium 8.4 - 10.4 mg/dL 9.9 8.6 1.3(Y8.2(L)  Total Protein 6.4 - 8.3 g/dL 7.7 6.2 6.4  Total Bilirubin 0.2 - 1.2 mg/dL 0.5 0.4 0.5  Alkaline Phos 40 -  150 U/L 78 70 73  AST 5 - 34 U/L 18 40(H) 63(H)  ALT 0 - 55 U/L 18 52(H) 66(H)   Component     Latest Ref Rng & Units 12/21/2017  WBC     3.9 - 10.3 K/uL 3.8 (L)  RBC     3.70 - 5.45 MIL/uL 3.68 (L)  Hemoglobin     11.6 - 15.9 g/dL 95.6 (L)  HCT     21.3 - 46.6 % 31.5 (L)  MCV     79.5 - 101.0 fL 85.6  MCH     25.1 - 34.0 pg 29.6  MCHC     31.5 - 36.0 g/dL 08.6  RDW     57.8 - 46.9 % 12.9  Platelets     145 - 400 K/uL 210  Neutrophils     % 65  NEUT#     1.5 - 6.5 K/uL 2.5  Lymphocytes     % 25  Lymphocyte #     0.9 - 3.3 K/uL 0.9  Monocytes Relative     % 3  Monocyte #     0.1 - 0.9 K/uL 0.1  Eosinophil     % 6  Eosinophils Absolute     0.0 - 0.5 K/uL 0.2  Basophil     % 1  Basophils Absolute     0.0 - 0.1 K/uL 0.0  Sodium     136 - 145 mmol/L 135 (L)  Potassium     3.5 - 5.1 mmol/L 4.9  Chloride     98 - 109 mmol/L 101  CO2     22 - 29 mmol/L 23  Glucose     70 - 140 mg/dL 629 (H)  BUN     7 - 26 mg/dL 37 (H)  Creatinine     0.60 - 1.10 mg/dL 5.28 (H)  Calcium     8.4 - 10.4 mg/dL 9.9  Total Protein     6.4 - 8.3 g/dL 7.7  Albumin     3.5 - 5.0 g/dL 4.4  AST     5 - 34 U/L 18  ALT     0 - 55 U/L 18  Alkaline Phosphatase     40 - 150 U/L 78  Total Bilirubin     0.2 - 1.2 mg/dL 0.5  GFR, Est Non African American     >60 mL/min 28 (L)  GFR, Est African American     >60 mL/min 32 (L)  Anion gap     3 - 11 11  IgG (Immunoglobin G), Serum     700 - 1,600 mg/dL 4,132  IgA      87 - 440 mg/dL 102  IgM (Immunoglobulin M), Srm     26 - 217 mg/dL 43  Total Protein ELP     6.0 - 8.5 g/dL 6.9  Albumin SerPl Elph-Mcnc     2.9 - 4.4 g/dL 3.9  Alpha 1     0.0 - 0.4 g/dL 0.2  Alpha2 Glob SerPl Elph-Mcnc     0.4 - 1.0 g/dL 0.7  B-Globulin SerPl Elph-Mcnc     0.7 - 1.3 g/dL 1.0  Gamma Glob SerPl Elph-Mcnc     0.4 - 1.8 g/dL 1.0  M Protein SerPl Elph-Mcnc     Not Observed g/dL Not Observed  Globulin, Total     2.2 - 3.9 g/dL 3.0  Albumin/Glob SerPl     0.7 - 1.7 1.4  IFE 1  Comment  Please Note (HCV):      Comment  Hgb A2 Quant     1.8 - 3.2 % 4.4 (H)  Hgb F Quant     0.0 - 2.0 % 0.0  Hgb S Quant     0.0 % 33.8 (H)  HGB C     0.0 % 0.0  Hgb A     96.4 - 98.8 % 61.8 (L)  HGB VARIANT     0.0 % 0.0  Please Note:      Comment  Iron     41 - 142 ug/dL 73  TIBC     161 - 096 ug/dL 045  Saturation Ratios     21 - 57 % 24  UIBC     ug/dL 409  Kappa free light chain     3.3 - 19.4 mg/L 26.1 (H)  Lamda free light chains     5.7 - 26.3 mg/L 13.7  Kappa, lamda light chain ratio     0.26 - 1.65 1.91 (H)  Retic Ct Pct     0.7 - 2.1 % 2.5 (H)  RBC.     3.70 - 5.45 MIL/uL 3.68 (L)  Retic Count, Absolute     33.7 - 90.7 K/uL 92.0 (H)  LDH     125 - 245 U/L 206  Ferritin     9 - 269 ng/mL 161  Vitamin B12     180 - 914 pg/mL 3,201 (H)  Copper     72 - 166 ug/dL 811  Erythropoietin     2.6 - 18.5 mIU/mL 26.8 (H)    RADIOGRAPHIC STUDIES: I have personally reviewed the radiological images as listed and agreed with the findings in the report. No results found.  ASSESSMENT & PLAN:  Catherine [name] is a 72 y.o. African-American  female with   1) Chronic Leukocytopenia  Labs from 2017 show a more chronic Leukopenia Only one episode of infection with Pneumonia in 2015 WBC count is low normal at 3.8k with no neutropenia. Could be from her previous treatment with RAI for hyperthyroidism  2) Mild Normocytic Anemia  Known to be anemic for  several years. Currently, likely related to her CKD. Creatinine 1.79 Ferritin levels adequate 161 SPEP neg for M protein Copper WNL B12 -elevated LDH WNL - no overt evidence of hemolysis Metabolic stress from uncontrolled DM2 could also play a role.  3) Sickle cell trait Confirmed on hgb electrophoresis.  PLAN:  -labs reviewed  -no indication for BM Bx or additional w/u at this time. If blood counts/leukopenia worsening may be concern for MDS and more workup will be needed with BM Bx.  -reasonable to continue PO iron to maintain ferritin >100 in setting of CKD -if worsening anemia with hgb<9 might need to consider EPO therapy -sickle cell trait confirmed with hgb electrophoresis- should not cause anemia -I discussed her possible causes of leukocytopenia, and anemia.  -could reduce B12 replacement dose given elevated levels  Labs today RTC with Dr Candise Che in 2 weeks   All of the patients questions were answered with apparent satisfaction. The patient knows to call the clinic with any problems, questions or concerns.  I spent 40 minutes counseling the patient face to face. The total time spent in the appointment was 50 minutes and more than 50% was on counseling and direct patient cares.    Wyvonnia Lora MD MS AAHIVMS [facility] Hematology/Oncology Physician [facility]  (Office):  [phone] (Work cell):  [phone] (Fax):           [phone]  12/21/2017 11:09 AM   This document serves as a record of services personally performed by Wyvonnia Lora, MD. It was created on his behalf by Delphina Cahill, a trained medical scribe. The creation of this record is based on the scribe's personal observations and the provider's statements to them.    .I have reviewed the above documentation for accuracy and completeness, and I agree with the above. Johney Maine MD MS

## 2017-12-22 LAB — KAPPA/LAMBDA LIGHT CHAINS
Kappa free light chain: 26.1 mg/L — ABNORMAL HIGH (ref 3.3–19.4)
Kappa, lambda light chain ratio: 1.91 — ABNORMAL HIGH (ref 0.26–1.65)
Lambda free light chains: 13.7 mg/L (ref 5.7–26.3)

## 2017-12-22 LAB — ERYTHROPOIETIN: Erythropoietin: 26.8 m[IU]/mL — ABNORMAL HIGH (ref 2.6–18.5)

## 2017-12-23 LAB — MULTIPLE MYELOMA PANEL, SERUM
ALBUMIN/GLOB SERPL: 1.4 (ref 0.7–1.7)
Albumin SerPl Elph-Mcnc: 3.9 g/dL (ref 2.9–4.4)
Alpha 1: 0.2 g/dL (ref 0.0–0.4)
Alpha2 Glob SerPl Elph-Mcnc: 0.7 g/dL (ref 0.4–1.0)
B-Globulin SerPl Elph-Mcnc: 1 g/dL (ref 0.7–1.3)
Gamma Glob SerPl Elph-Mcnc: 1 g/dL (ref 0.4–1.8)
Globulin, Total: 3 g/dL (ref 2.2–3.9)
IGM (IMMUNOGLOBULIN M), SRM: 43 mg/dL (ref 26–217)
IgA: 146 mg/dL (ref 87–352)
IgG (Immunoglobin G), Serum: 1023 mg/dL (ref 700–1600)
Total Protein ELP: 6.9 g/dL (ref 6.0–8.5)

## 2017-12-23 LAB — HEMOGLOBINOPATHY EVALUATION
HGB A2 QUANT: 4.4 % — AB (ref 1.8–3.2)
HGB C: 0 %
HGB S QUANTITAION: 33.8 % — AB
Hgb A: 61.8 % — ABNORMAL LOW (ref 96.4–98.8)
Hgb F Quant: 0 % (ref 0.0–2.0)
Hgb Variant: 0 %

## 2017-12-23 LAB — COPPER, SERUM: COPPER: 146 ug/dL (ref 72–166)

## 2017-12-31 DIAGNOSIS — E1165 Type 2 diabetes mellitus with hyperglycemia: Secondary | ICD-10-CM | POA: Diagnosis not present

## 2018-01-06 NOTE — Progress Notes (Signed)
HEMATOLOGY/ONCOLOGY CLINIC NOTE  Date of Service: 01/07/2018  Patient Care Team: Dorothyann Peng, MD as PCP - General (Internal Medicine) Johney Maine, MD as Consulting Physician (Hematology)  CHIEF COMPLAINTS/PURPOSE OF CONSULTATION:   F/u for leucopenia  HISTORY OF PRESENTING ILLNESS:   Catherine [name] is a wonderful 72 y.o. female who has been referred to [place] by PCP Dr. Allyne Gee, from [facility], for evaluation and management of leukocytopenia.   She presents to the clinic today accompanied by her husband. She was not aware of her low white count until late 2018. She denies frequent infections since her episode of pneumonia in 2015. She notes she had more recent blood work done but does not have the results.  She notes she was told she was anemic since she was young, but did not know the cause. She was only told 2 years ago that she carried the sickle cell trait. She has only been on oral iron, ferrous sulfate 1 tab once daily recently. She has been on B12 since her pneumonia in 2015. She was not told she was B12 deficient before.  She denies being on acid suppressants.   She has been on thyroid medication for her initial hyperthyroidism diagnosed 5 years ago. She was also treated with thyroid radiaiton in the past. She was then put on Synthroid which was increased in 2018. She is on tradjenta in 10/2017 for her DM, she is no longer taking insulin.   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 She does not smoke and does not drink.   On review of symptoms, pt notes having a chill most days, she notes mild weight loss. She notes having cramps when she does repetitious activity in her right shoulder and occasional right calf cramps at night. She notes her stool is black but she attributed that to her oral iron. No black stool prior to being on oral iron. She notes swelling in her hands.  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  She denies abdominal pain, leg swelling.  INTERVAL HISTORY:  [name] is here for a scheduled follow-up of her Leukocytopenia. She is accompanied by her husband. She is doing well since her last visit here and notes no acute new symptoms.  On review of systems, pt denies fever, chills, weight loss, decreased appetite, decreased energy levels, mouth sores and bilateral lower extremity. Denies pain. Pt denies abdominal pain, nausea, vomiting and bladder and bowel changes.    REVIEW OF SYSTEMS:    .10 Point review of Systems was done is negative except as noted above.  MEDICAL HISTORY:  Past Medical History:  Diagnosis Date  . Diabetes mellitus without complication (HCC)   . Hypertension   . Thyroid disease     SURGICAL HISTORY: Past Surgical History:  Procedure Laterality Date  . CHOLECYSTECTOMY      SOCIAL HISTORY: Social History   Socioeconomic History  . Marital status: Married    Spouse name: Not on file  . Number of children: Not on file  . Years of education: Not on file  . Highest education level: Not on file  Social Needs  . Financial resource strain: Not on file  . Food insecurity - worry: Not on file  . Food insecurity - inability: Not on file  . Transportation needs - medical: Not on file  . Transportation needs - non-medical: Not on file  Occupational  History  . Not on file  Tobacco Use  . Smoking status: Never Smoker  . Smokeless tobacco: Never Used  Substance and Sexual Activity  . Alcohol use: No  . Drug use: No  . Sexual activity: Not on file  Other Topics Concern  . Not on file  Social History Narrative  . Not on file    FAMILY HISTORY: Family History  Problem Relation Age of Onset  . Diabetes Mellitus II Mother   . Stroke Mother   . Diabetes Mellitus II Sister   . Hypertension Sister     ALLERGIES:  has No Known Allergies.  MEDICATIONS:  Current Outpatient Medications  Medication Sig  Dispense Refill  . Ascorbic Acid (VITAMIN C) 1000 MG tablet Take 1,000 mg by mouth daily.    [name] Kitchen aspirin 81 MG tablet Take 81 mg by mouth daily.    [name] Kitchen atorvastatin (LIPITOR) 10 MG tablet Take 10 mg by mouth daily.    . beta carotene w/minerals (OCUVITE) tablet Take 1 tablet by mouth daily.    . calcium citrate-vitamin D (CITRACAL+D) 315-200 MG-UNIT tablet Take 1 tablet by mouth 2 (two) times daily.    . Cyanocobalamin (VITAMIN B 12 PO) Take 1,000 mg by mouth daily.    . ferrous sulfate 325 (65 FE) MG tablet Take 1 tablet (325 mg total) by mouth 2 (two) times daily with a meal. 60 tablet 0  . glimepiride (AMARYL) 4 MG tablet Take 1 tablet (4 mg total) by mouth daily with breakfast. 30 tablet 0  . levothyroxine (SYNTHROID, LEVOTHROID) 50 MCG tablet Take 50 mcg by mouth daily before breakfast.    . linagliptin (TRADJENTA) 5 MG TABS tablet Take 1 tablet (5 mg total) by mouth daily. For diabetes 30 tablet 0  . Multiple Vitamin (MULTIVITAMIN WITH MINERALS) TABS tablet Take 1 tablet by mouth daily.    . Omega-3 Fatty Acids (FISH OIL) 1000 MG CAPS Take by mouth.    . Wheat Dextrin (BENEFIBER DRINK MIX PO) Take by mouth.     No current facility-administered medications for this visit.     PHYSICAL EXAMINATION: ECOG PERFORMANCE STATUS: 1 - Symptomatic but completely ambulatory  . Vitals:   01/07/18 0933  BP: (!) 128/44  Pulse: 90  Resp: 18  Temp: 98.8 F (37.1 C)  SpO2: 97%   Filed Weights   01/07/18 0933  Weight: 182 lb 1.6 oz (82.6 kg)   .Body mass index is 30.54 kg/m. NAD GENERAL:alert, in no acute distress and comfortable SKIN: no acute rashes, no significant lesions EYES: conjunctiva are pink and non-injected, sclera anicteric OROPHARYNX: MMM, no exudates, no oropharyngeal erythema or ulceration NECK: supple, no JVD LYMPH:  no palpable lymphadenopathy in the cervical, axillary or inguinal regions LUNGS: clear to auscultation b/l with normal respiratory effort HEART: regular rate  & rhythm ABDOMEN:  normoactive bowel sounds , non tender, not distended. Extremity: no pedal edema PSYCH: alert & oriented x 3 with fluent speech NEURO: no focal motor/sensory deficits   LABORATORY DATA:  I have reviewed the data as listed  . CBC Latest Ref Rng & Units 12/21/2017 08/23/2014 08/22/2014  WBC 3.9 - 10.3 K/uL 3.8(L) - -  Hemoglobin 12.0 - 15.0 g/dL - 7.9(L) 8.2(L)  Hematocrit 34.8 - 46.6 % 31.5(L) 23.7(L) 25.0(L)  Platelets 145 - 400 K/uL 210 - -  hgb 10.9  . CMP Latest Ref Rng & Units 12/21/2017 08/17/2014 08/16/2014  Glucose 70 - 140 mg/dL 161(W) 960(A) 540(J)  BUN 7 - 26  mg/dL 14(N) 15 19  Creatinine 0.60 - 1.10 mg/dL 8.29(F) 6.21 3.08  Sodium 136 - 145 mmol/L 135(L) 140 143  Potassium 3.5 - 5.1 mmol/L 4.9 3.4(L) 3.5(L)  Chloride 98 - 109 mmol/L 101 106 108  CO2 22 - 29 mmol/L 23 22 22   Calcium 8.4 - 10.4 mg/dL 9.9 8.6 6.5(H)  Total Protein 6.4 - 8.3 g/dL 7.7 6.2 6.4  Total Bilirubin 0.2 - 1.2 mg/dL 0.5 0.4 0.5  Alkaline Phos 40 - 150 U/L 78 70 73  AST 5 - 34 U/L 18 40(H) 63(H)  ALT 0 - 55 U/L 18 52(H) 66(H)   Component     Latest Ref Rng & Units 12/21/2017  WBC     3.9 - 10.3 K/uL 3.8 (L)  RBC     3.70 - 5.45 MIL/uL 3.68 (L)  Hemoglobin     11.6 - 15.9 g/dL 84.6 (L)  HCT     96.2 - 46.6 % 31.5 (L)  MCV     79.5 - 101.0 fL 85.6  MCH     25.1 - 34.0 pg 29.6  MCHC     31.5 - 36.0 g/dL 95.2  RDW     84.1 - 32.4 % 12.9  Platelets     145 - 400 K/uL 210  Neutrophils     % 65  NEUT#     1.5 - 6.5 K/uL 2.5  Lymphocytes     % 25  Lymphocyte #     0.9 - 3.3 K/uL 0.9  Monocytes Relative     % 3  Monocyte #     0.1 - 0.9 K/uL 0.1  Eosinophil     % 6  Eosinophils Absolute     0.0 - 0.5 K/uL 0.2  Basophil     % 1  Basophils Absolute     0.0 - 0.1 K/uL 0.0  Sodium     136 - 145 mmol/L 135 (L)  Potassium     3.5 - 5.1 mmol/L 4.9  Chloride     98 - 109 mmol/L 101  CO2     22 - 29 mmol/L 23  Glucose     70 - 140 mg/dL 401 (H)  BUN     7 - 26  mg/dL 37 (H)  Creatinine     0.60 - 1.10 mg/dL 0.27 (H)  Calcium     8.4 - 10.4 mg/dL 9.9  Total Protein     6.4 - 8.3 g/dL 7.7  Albumin     3.5 - 5.0 g/dL 4.4  AST     5 - 34 U/L 18  ALT     0 - 55 U/L 18  Alkaline Phosphatase     40 - 150 U/L 78  Total Bilirubin     0.2 - 1.2 mg/dL 0.5  GFR, Est Non African American     >60 mL/min 28 (L)  GFR, Est African American     >60 mL/min 32 (L)  Anion gap     3 - 11 11  IgG (Immunoglobin G), Serum     700 - 1,600 mg/dL 2,536  IgA     87 - 644 mg/dL 034  IgM (Immunoglobulin M), Srm     26 - 217 mg/dL 43  Total Protein ELP     6.0 - 8.5 g/dL 6.9  Albumin SerPl Elph-Mcnc     2.9 - 4.4 g/dL 3.9  Alpha 1     0.0 - 0.4 g/dL 0.2  Alpha2 Glob SerPl Elph-Mcnc     0.4 - 1.0 g/dL 0.7  B-Globulin SerPl Elph-Mcnc     0.7 - 1.3 g/dL 1.0  Gamma Glob SerPl Elph-Mcnc     0.4 - 1.8 g/dL 1.0  M Protein SerPl Elph-Mcnc     Not Observed g/dL Not Observed  Globulin, Total     2.2 - 3.9 g/dL 3.0  Albumin/Glob SerPl     0.7 - 1.7 1.4  IFE 1      Comment  Please Note (HCV):      Comment  Hgb A2 Quant     1.8 - 3.2 % 4.4 (H)  Hgb F Quant     0.0 - 2.0 % 0.0  Hgb S Quant     0.0 % 33.8 (H)  HGB C     0.0 % 0.0  Hgb A     96.4 - 98.8 % 61.8 (L)  HGB VARIANT     0.0 % 0.0  Please Note:      Comment  Iron     41 - 142 ug/dL 73  TIBC     696236 - 295444 ug/dL 284309  Saturation Ratios     21 - 57 % 24  UIBC     ug/dL 132236  Kappa free light chain     3.3 - 19.4 mg/L 26.1 (H)  Lamda free light chains     5.7 - 26.3 mg/L 13.7  Kappa, lamda light chain ratio     0.26 - 1.65 1.91 (H)  Retic Ct Pct     0.7 - 2.1 % 2.5 (H)  RBC.     3.70 - 5.45 MIL/uL 3.68 (L)  Retic Count, Absolute     33.7 - 90.7 K/uL 92.0 (H)  LDH     125 - 245 U/L 206  Ferritin     9 - 269 ng/mL 161  Vitamin B12     180 - 914 pg/mL 3,201 (H)  Copper     72 - 166 ug/dL 440146  Erythropoietin     2.6 - 18.5 mIU/mL 26.8 (H)    RADIOGRAPHIC STUDIES: I have  personally reviewed the radiological images as listed and agreed with the findings in the report.  No results found.  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 Could be from her previous treatment with RAI for hyperthyroidism  2) Mild Normocytic Anemia  Known to be anemic for several years. Currently, likely related to her CKD. Creatinine 1.79 Ferritin levels adequate 161 SPEP neg for M protein Copper WNL B12 -elevated LDH WNL - no overt evidence of hemolysis Metabolic stress from uncontrolled DM2 could also play a role.  3) Sickle cell trait Confirmed on hgb electrophoresis.  4) Chronic Kidney Disease, Stage III - briefly discussed her CKD and what OTC pain medications are best to use - recommended to follow-up with her PCP  PLAN:  -labs reviewed  -no indication for BM Bx or additional w/u at this time. If blood counts/leukopenia worsening may be concern for MDS and more workup will be needed with BM Bx.  -reasonable to continue PO iron to maintain ferritin >100 in setting of CKD -if worsening anemia with hgb<9 might need to consider EPO therapy -sickle cell trait confirmed  with hgb electrophoresis- should not cause anemia -I discussed her possible causes of leukocytopenia, and anemia.  -could reduce B12 replacement dose given elevated levels  RTC with Dr Candise Che as needed plz reconsult [place] if worsening leukopenia WBC<3k or worsening anemia.  All of the patients questions were answered with apparent satisfaction. The patient knows to call the clinic with any problems, questions or concerns.  . The total time spent in the appointment was 15 minutes and more than 50% was on counseling and direct patient cares.     Wyvonnia Lora MD MS AAHIVMS [facility]  [facility] Hematology/Oncology Physician [facility]  (Office):       [phone] (Work cell):  [phone] (Fax):           [phone]  01/07/2018 10:22 AM   This document serves as a record of services personally performed by Wyvonnia Lora, MD. It was created on his behalf by Diona Browner, a trained medical scribe. The creation of this record is based on the scribe's personal observations and the provider's statements to them.   .I have reviewed the above documentation for accuracy and completeness, and I agree with the above. Johney Maine MD MS

## 2018-01-07 ENCOUNTER — Encounter: Payer: Self-pay | Admitting: Hematology

## 2018-01-07 ENCOUNTER — Telehealth: Payer: Self-pay

## 2018-01-07 ENCOUNTER — Inpatient Hospital Stay (HOSPITAL_BASED_OUTPATIENT_CLINIC_OR_DEPARTMENT_OTHER): Payer: Medicare Other | Admitting: Hematology

## 2018-01-07 VITALS — BP 128/44 | HR 90 | Temp 98.8°F | Resp 18 | Ht 64.75 in | Wt 182.1 lb

## 2018-01-07 DIAGNOSIS — E1165 Type 2 diabetes mellitus with hyperglycemia: Secondary | ICD-10-CM | POA: Diagnosis not present

## 2018-01-07 DIAGNOSIS — Z7984 Long term (current) use of oral hypoglycemic drugs: Secondary | ICD-10-CM | POA: Diagnosis not present

## 2018-01-07 DIAGNOSIS — D649 Anemia, unspecified: Secondary | ICD-10-CM

## 2018-01-07 DIAGNOSIS — N183 Chronic kidney disease, stage 3 (moderate): Secondary | ICD-10-CM

## 2018-01-07 DIAGNOSIS — E0590 Thyrotoxicosis, unspecified without thyrotoxic crisis or storm: Secondary | ICD-10-CM | POA: Diagnosis not present

## 2018-01-07 DIAGNOSIS — D72819 Decreased white blood cell count, unspecified: Secondary | ICD-10-CM | POA: Diagnosis not present

## 2018-01-07 DIAGNOSIS — I129 Hypertensive chronic kidney disease with stage 1 through stage 4 chronic kidney disease, or unspecified chronic kidney disease: Secondary | ICD-10-CM

## 2018-01-07 DIAGNOSIS — R600 Localized edema: Secondary | ICD-10-CM | POA: Diagnosis not present

## 2018-01-07 DIAGNOSIS — Z7982 Long term (current) use of aspirin: Secondary | ICD-10-CM

## 2018-01-07 DIAGNOSIS — D573 Sickle-cell trait: Secondary | ICD-10-CM | POA: Diagnosis not present

## 2018-01-07 DIAGNOSIS — Z8701 Personal history of pneumonia (recurrent): Secondary | ICD-10-CM | POA: Diagnosis not present

## 2018-01-07 DIAGNOSIS — Z79899 Other long term (current) drug therapy: Secondary | ICD-10-CM | POA: Diagnosis not present

## 2018-01-07 NOTE — Telephone Encounter (Signed)
RTC with Dr Candise CheKale as needed. Per 2/22 los

## 2018-01-14 ENCOUNTER — Other Ambulatory Visit: Payer: Self-pay | Admitting: Nephrology

## 2018-01-14 DIAGNOSIS — E785 Hyperlipidemia, unspecified: Secondary | ICD-10-CM | POA: Diagnosis not present

## 2018-01-14 DIAGNOSIS — D649 Anemia, unspecified: Secondary | ICD-10-CM | POA: Diagnosis not present

## 2018-01-14 DIAGNOSIS — N183 Chronic kidney disease, stage 3 unspecified: Secondary | ICD-10-CM

## 2018-01-14 DIAGNOSIS — E1122 Type 2 diabetes mellitus with diabetic chronic kidney disease: Secondary | ICD-10-CM | POA: Diagnosis not present

## 2018-01-17 ENCOUNTER — Ambulatory Visit
Admission: RE | Admit: 2018-01-17 | Discharge: 2018-01-17 | Disposition: A | Discharge status: Home / Self Care | Payer: Medicare Other | Source: Ambulatory Visit | Attending: Nephrology | Admitting: Nephrology

## 2018-01-17 DIAGNOSIS — N189 Chronic kidney disease, unspecified: Secondary | ICD-10-CM | POA: Diagnosis not present

## 2018-01-17 DIAGNOSIS — N183 Chronic kidney disease, stage 3 unspecified: Secondary | ICD-10-CM

## 2018-03-16 DIAGNOSIS — E782 Mixed hyperlipidemia: Secondary | ICD-10-CM | POA: Diagnosis not present

## 2018-03-16 DIAGNOSIS — E039 Hypothyroidism, unspecified: Secondary | ICD-10-CM | POA: Diagnosis not present

## 2018-03-16 DIAGNOSIS — Z72821 Inadequate sleep hygiene: Secondary | ICD-10-CM | POA: Diagnosis not present

## 2018-03-16 DIAGNOSIS — E1165 Type 2 diabetes mellitus with hyperglycemia: Secondary | ICD-10-CM | POA: Diagnosis not present

## 2018-03-16 DIAGNOSIS — I129 Hypertensive chronic kidney disease with stage 1 through stage 4 chronic kidney disease, or unspecified chronic kidney disease: Secondary | ICD-10-CM | POA: Diagnosis not present

## 2018-04-21 DIAGNOSIS — E1165 Type 2 diabetes mellitus with hyperglycemia: Secondary | ICD-10-CM | POA: Diagnosis not present

## 2018-04-21 DIAGNOSIS — I10 Essential (primary) hypertension: Secondary | ICD-10-CM | POA: Diagnosis not present

## 2018-04-21 DIAGNOSIS — E7800 Pure hypercholesterolemia, unspecified: Secondary | ICD-10-CM | POA: Diagnosis not present

## 2018-04-21 DIAGNOSIS — E890 Postprocedural hypothyroidism: Secondary | ICD-10-CM | POA: Diagnosis not present

## 2018-05-17 DIAGNOSIS — E1122 Type 2 diabetes mellitus with diabetic chronic kidney disease: Secondary | ICD-10-CM | POA: Diagnosis not present

## 2018-05-17 DIAGNOSIS — N183 Chronic kidney disease, stage 3 (moderate): Secondary | ICD-10-CM | POA: Diagnosis not present

## 2018-05-17 DIAGNOSIS — E785 Hyperlipidemia, unspecified: Secondary | ICD-10-CM | POA: Diagnosis not present

## 2018-05-17 DIAGNOSIS — I129 Hypertensive chronic kidney disease with stage 1 through stage 4 chronic kidney disease, or unspecified chronic kidney disease: Secondary | ICD-10-CM | POA: Diagnosis not present

## 2018-05-17 DIAGNOSIS — D649 Anemia, unspecified: Secondary | ICD-10-CM | POA: Diagnosis not present

## 2018-06-16 DIAGNOSIS — E559 Vitamin D deficiency, unspecified: Secondary | ICD-10-CM | POA: Diagnosis not present

## 2018-06-16 DIAGNOSIS — E039 Hypothyroidism, unspecified: Secondary | ICD-10-CM | POA: Diagnosis not present

## 2018-06-16 DIAGNOSIS — E1165 Type 2 diabetes mellitus with hyperglycemia: Secondary | ICD-10-CM | POA: Diagnosis not present

## 2018-06-16 DIAGNOSIS — E782 Mixed hyperlipidemia: Secondary | ICD-10-CM | POA: Diagnosis not present

## 2018-06-16 DIAGNOSIS — N183 Chronic kidney disease, stage 3 (moderate): Secondary | ICD-10-CM | POA: Diagnosis not present

## 2018-06-16 DIAGNOSIS — I129 Hypertensive chronic kidney disease with stage 1 through stage 4 chronic kidney disease, or unspecified chronic kidney disease: Secondary | ICD-10-CM | POA: Diagnosis not present

## 2018-06-16 LAB — BASIC METABOLIC PANEL
CREATININE: 1.3 — AB (ref 0.5–1.1)
Glucose: 182
Potassium: 4.4 (ref 3.4–5.3)
Sodium: 141 (ref 137–147)

## 2018-06-16 LAB — LIPID PANEL
Cholesterol: 150 (ref 0–200)
HDL: 52 (ref 35–70)
LDL CALC: 78
LDL/HDL RATIO: 1.5
Triglycerides: 101 (ref 40–160)

## 2018-06-16 LAB — TSH: TSH: 1.02 (ref 0.41–5.90)

## 2018-06-16 LAB — VITAMIN D 25 HYDROXY (VIT D DEFICIENCY, FRACTURES): Vit D, 25-Hydroxy: 40.8

## 2018-06-16 LAB — HEMOGLOBIN A1C: HEMOGLOBIN A1C: 7.6 — AB (ref 4.0–6.0)

## 2018-08-03 ENCOUNTER — Encounter: Payer: Self-pay | Admitting: Internal Medicine

## 2018-08-03 ENCOUNTER — Encounter: Payer: Self-pay | Admitting: Nurse Practitioner

## 2018-08-03 DIAGNOSIS — I129 Hypertensive chronic kidney disease with stage 1 through stage 4 chronic kidney disease, or unspecified chronic kidney disease: Secondary | ICD-10-CM | POA: Insufficient documentation

## 2018-08-03 DIAGNOSIS — N183 Chronic kidney disease, stage 3 unspecified: Secondary | ICD-10-CM | POA: Insufficient documentation

## 2018-08-08 DIAGNOSIS — Z23 Encounter for immunization: Secondary | ICD-10-CM | POA: Diagnosis not present

## 2018-08-17 ENCOUNTER — Ambulatory Visit (INDEPENDENT_AMBULATORY_CARE_PROVIDER_SITE_OTHER): Payer: Medicare Other

## 2018-08-17 ENCOUNTER — Encounter: Payer: Self-pay | Admitting: Internal Medicine

## 2018-08-17 ENCOUNTER — Ambulatory Visit (INDEPENDENT_AMBULATORY_CARE_PROVIDER_SITE_OTHER): Payer: Medicare Other | Admitting: Internal Medicine

## 2018-08-17 VITALS — BP 116/62 | HR 74 | Temp 98.2°F | Ht 64.5 in | Wt 172.3 lb

## 2018-08-17 VITALS — BP 116/62 | HR 74 | Temp 98.2°F | Ht 64.5 in | Wt 175.2 lb

## 2018-08-17 DIAGNOSIS — E1165 Type 2 diabetes mellitus with hyperglycemia: Secondary | ICD-10-CM

## 2018-08-17 DIAGNOSIS — I10 Essential (primary) hypertension: Secondary | ICD-10-CM

## 2018-08-17 DIAGNOSIS — Z0000 Encounter for general adult medical examination without abnormal findings: Secondary | ICD-10-CM | POA: Diagnosis not present

## 2018-08-17 NOTE — Progress Notes (Signed)
Subjective:   Catherine [name] is a 72 y.o. female who presents for Medicare Annual (Subsequent) preventive examination.  Review of Systems:  N/A       Objective:     Vitals: There were no vitals taken for this visit.  There is no height or weight on file to calculate BMI.  Advanced Directives 12/21/2017 03/16/2017 08/13/2014  Does Patient Have a Medical Advance Directive? No Yes No  Does patient want to make changes to medical advance directive? - Yes (MAU/Ambulatory/Procedural Areas - Information given) -    Tobacco Social History   Tobacco Use  Smoking Status Never Smoker  Smokeless Tobacco Never Used     Counseling given: Not Answered   Clinical Intake:                       Past Medical History:  Diagnosis Date  . Diabetes mellitus without complication (HCC)   . Hypertension   . Thyroid disease    Past Surgical History:  Procedure Laterality Date  . CHOLECYSTECTOMY     Family History  Problem Relation Age of Onset  . Diabetes Mellitus II Mother   . Stroke Mother   . Diabetes Mellitus II Sister   . Hypertension Sister    Social History   Socioeconomic History  . Marital status: Married    Spouse name: Not on file  . Number of children: Not on file  . Years of education: Not on file  . Highest education level: Not on file  Occupational History  . Not on file  Social Needs  . Financial resource strain: Not on file  . Food insecurity:    Worry: Not on file    Inability: Not on file  . Transportation needs:    Medical: Not on file    Non-medical: Not on file  Tobacco Use  . Smoking status: Never Smoker  . Smokeless tobacco: Never Used  Substance and Sexual Activity  . Alcohol use: No  . Drug use: No  . Sexual activity: Not on file  Lifestyle  . Physical activity:    Days per week: Not on file    Minutes per session: Not on file  . Stress: Not on file  Relationships  . Social connections:    Talks on phone: Not on file   Gets together: Not on file    Attends religious service: Not on file    Active member of club or organization: Not on file    Attends meetings of clubs or organizations: Not on file    Relationship status: Not on file  Other Topics Concern  . Not on file  Social History Narrative  . Not on file    Outpatient Encounter Medications as of 08/17/2018  Medication Sig  . Ascorbic Acid (VITAMIN C) 1000 MG tablet Take 1,000 mg by mouth daily.  [name] Kitchen aspirin 81 MG tablet Take 81 mg by mouth daily.  [name] Kitchen atorvastatin (LIPITOR) 10 MG tablet Take 10 mg by mouth daily.  . beta carotene w/minerals (OCUVITE) tablet Take 1 tablet by mouth daily.  . calcium citrate-vitamin D (CITRACAL+D) 315-200 MG-UNIT tablet Take 1 tablet by mouth 2 (two) times daily.  . Cyanocobalamin (VITAMIN B 12 PO) Take 1,000 mg by mouth daily.  . ferrous sulfate 325 (65 FE) MG tablet Take 1 tablet (325 mg total) by mouth 2 (two) times daily with a meal.  . glimepiride (AMARYL) 4 MG tablet Take 1 tablet (4 mg total)  by mouth daily with breakfast.  . levothyroxine (SYNTHROID, LEVOTHROID) 50 MCG tablet Take 50 mcg by mouth daily before breakfast.  . linagliptin (TRADJENTA) 5 MG TABS tablet Take 1 tablet (5 mg total) by mouth daily. For diabetes  . lisinopril (PRINIVIL,ZESTRIL) 10 MG tablet Take 10 mg by mouth daily.  . Multiple Vitamin (MULTIVITAMIN WITH MINERALS) TABS tablet Take 1 tablet by mouth daily.  . Omega-3 Fatty Acids (FISH OIL) 1000 MG CAPS Take by mouth.  . Polyethylene Glycol 3350-GRX POWD Take by mouth.  . Wheat Dextrin (BENEFIBER DRINK MIX PO) Take by mouth.   No facility-administered encounter medications on file as of 08/17/2018.     Activities of Daily Living No flowsheet data found.  Patient Care Team: Dorothyann Peng, MD as PCP - General (Internal Medicine) Johney Maine, MD as Consulting Physician (Hematology)    Assessment:   This is a routine wellness examination for [place].  Exercise Activities and  Dietary recommendations    Goals   None     Fall Risk Fall Risk  03/16/2017  Falls in the past year? No   Is the patient's home free of loose throw rugs in walkways, pet beds, electrical cords, etc?   yes      Grab bars in the bathroom? no      Handrails on the stairs?   no      Adequate lighting?   yes  Timed Get Up and Go performed: N/A  Depression Screen PHQ 2/9 Scores 03/16/2017  PHQ - 2 Score 0     Cognitive Function        Immunization History  Administered Date(s) Administered  . Influenza,inj,Quad PF,6+ Mos 08/16/2014    Qualifies for Shingles Vaccine?yes. declined  Screening Tests Health Maintenance  Topic Date Due  . FOOT EXAM  07/06/1957  . OPHTHALMOLOGY EXAM  07/06/1957  . TETANUS/TDAP  07/06/1966  . MAMMOGRAM  07/06/1997  . COLONOSCOPY  07/06/1997  . DEXA SCAN  07/06/2012  . PNA vac Low Risk Adult (1 of 2 - PCV13) 07/06/2012  . INFLUENZA VACCINE  06/16/2018  . HEMOGLOBIN A1C  12/17/2018  . Hepatitis C Screening  Completed    Cancer Screenings: Lung: Low Dose CT Chest recommended if Age 39-80 years, 30 pack-year currently smoking OR have quit w/in 15years. Patient does not qualify. Breast:  Up to date on Mammogram? No   Up to date of Bone Density/Dexa? Yes Colorectal: up to date. Due 05/2022  Additional Screenings: : Hepatitis C Screening: due     Plan:   Patient will be scheduling her mammogram as well as her eye appointment   I have personally reviewed and noted the following in the patient's chart:   . Medical and social history . Use of alcohol, tobacco or illicit drugs  . Current medications and supplements . Functional ability and status . Nutritional status . Physical activity . Advanced directives . List of other physicians . Hospitalizations, surgeries, and ER visits in previous 12 months . Vitals . Screenings to include cognitive, depression, and falls . Referrals and appointments  In addition, I have reviewed and  discussed with patient certain preventive protocols, quality metrics, and best practice recommendations. A written personalized care plan for preventive services as well as general preventive health recommendations were provided to patient.     Barb Merino, LPN  16/11/958

## 2018-08-17 NOTE — Patient Instructions (Signed)
Catherine [name] , Thank you for taking time to come for your Medicare Wellness Visit. I appreciate your ongoing commitment to your health goals. Please review the following plan we discussed and let me know if I can assist you in the future.   Screening recommendations/referrals: Colonoscopy:due 05/2022 Mammogram: due  Bone Density: up to date Diabetic Eye Exam: Recommended yearly dental visit for hygiene and checkup  Vaccinations: Influenza vaccine: due Pneumococcal vaccine: due Tdap vaccine: up to date  Shingles vaccine: declined    Advanced directives: up to date  Conditions/risks identified: Obesity  Next appointment: 11/17/2018 at 8:30am   Preventive Care 65 Years and Older, Female Preventive care refers to lifestyle choices and visits with your health care provider that can promote health and wellness. What does preventive care include?  A yearly physical exam. This is also called an annual well check.  Dental exams once or twice a year.  Routine eye exams. Ask your health care provider how often you should have your eyes checked.  Personal lifestyle choices, including:  Daily care of your teeth and gums.  Regular physical activity.  Eating a healthy diet.  Avoiding tobacco and drug use.  Limiting alcohol use.  Practicing safe sex.  Taking low-dose aspirin every day.  Taking vitamin and mineral supplements as recommended by your health care provider. What happens during an annual well check? The services and screenings done by your health care provider during your annual well check will depend on your age, overall health, lifestyle risk factors, and family history of disease. Counseling  Your health care provider may ask you questions about your:  Alcohol use.  Tobacco use.  Drug use.  Emotional well-being.  Home and relationship well-being.  Sexual activity.  Eating habits.  History of falls.  Memory and ability to understand (cognition).  Work  and work Astronomer.  Reproductive health. Screening  You may have the following tests or measurements:  Height, weight, and BMI.  Blood pressure.  Lipid and cholesterol levels. These may be checked every 5 years, or more frequently if you are over 80 years old.  Skin check.  Lung cancer screening. You may have this screening every year starting at age 81 if you have a 30-pack-year history of smoking and currently smoke or have quit within the past 15 years.  Fecal occult blood test (FOBT) of the stool. You may have this test every year starting at age 46.  Flexible sigmoidoscopy or colonoscopy. You may have a sigmoidoscopy every 5 years or a colonoscopy every 10 years starting at age 59.  Hepatitis C blood test.  Hepatitis B blood test.  Sexually transmitted disease (STD) testing.  Diabetes screening. This is done by checking your blood sugar (glucose) after you have not eaten for a while (fasting). You may have this done every 1-3 years.  Bone density scan. This is done to screen for osteoporosis. You may have this done starting at age 44.  Mammogram. This may be done every 1-2 years. Talk to your health care provider about how often you should have regular mammograms. Talk with your health care provider about your test results, treatment options, and if necessary, the need for more tests. Vaccines  Your health care provider may recommend certain vaccines, such as:  Influenza vaccine. This is recommended every year.  Tetanus, diphtheria, and acellular pertussis (Tdap, Td) vaccine. You may need a Td booster every 10 years.  Zoster vaccine. You may need this after age 22.  Pneumococcal 13-valent  conjugate (PCV13) vaccine. One dose is recommended after age 19.  Pneumococcal polysaccharide (PPSV23) vaccine. One dose is recommended after age 25. Talk to your health care provider about which screenings and vaccines you need and how often you need them. This information is  not intended to replace advice given to you by your health care provider. Make sure you discuss any questions you have with your health care provider. Document Released: 11/29/2015 Document Revised: 07/22/2016 Document Reviewed: 09/03/2015 Elsevier Interactive Patient Education  2017 [employer].  Fall Prevention in the Home Falls can cause injuries. They can happen to people of all ages. There are many things you can do to make your home safe and to help prevent falls. What can I do on the outside of my home?  Regularly fix the edges of walkways and driveways and fix any cracks.  Remove anything that might make you trip as you walk through a door, such as a raised step or threshold.  Trim any bushes or trees on the path to your home.  Use bright outdoor lighting.  Clear any walking paths of anything that might make someone trip, such as rocks or tools.  Regularly check to see if handrails are loose or broken. Make sure that both sides of any steps have handrails.  Any raised decks and porches should have guardrails on the edges.  Have any leaves, snow, or ice cleared regularly.  Use sand or salt on walking paths during winter.  Clean up any spills in your garage right away. This includes oil or grease spills. What can I do in the bathroom?  Use night lights.  Install grab bars by the toilet and in the tub and shower. Do not use towel bars as grab bars.  Use non-skid mats or decals in the tub or shower.  If you need to sit down in the shower, use a plastic, non-slip stool.  Keep the floor dry. Clean up any water that spills on the floor as soon as it happens.  Remove soap buildup in the tub or shower regularly.  Attach bath mats securely with double-sided non-slip rug tape.  Do not have throw rugs and other things on the floor that can make you trip. What can I do in the bedroom?  Use night lights.  Make sure that you have a light by your bed that is easy to  reach.  Do not use any sheets or blankets that are too big for your bed. They should not hang down onto the floor.  Have a firm chair that has side arms. You can use this for support while you get dressed.  Do not have throw rugs and other things on the floor that can make you trip. What can I do in the kitchen?  Clean up any spills right away.  Avoid walking on wet floors.  Keep items that you use a lot in easy-to-reach places.  If you need to reach something above you, use a strong step stool that has a grab bar.  Keep electrical cords out of the way.  Do not use floor polish or wax that makes floors slippery. If you must use wax, use non-skid floor wax.  Do not have throw rugs and other things on the floor that can make you trip. What can I do with my stairs?  Do not leave any items on the stairs.  Make sure that there are handrails on both sides of the stairs and use them. Fix handrails  that are broken or loose. Make sure that handrails are as long as the stairways.  Check any carpeting to make sure that it is firmly attached to the stairs. Fix any carpet that is loose or worn.  Avoid having throw rugs at the top or bottom of the stairs. If you do have throw rugs, attach them to the floor with carpet tape.  Make sure that you have a light switch at the top of the stairs and the bottom of the stairs. If you do not have them, ask someone to add them for you. What else can I do to help prevent falls?  Wear shoes that:  Do not have high heels.  Have rubber bottoms.  Are comfortable and fit you well.  Are closed at the toe. Do not wear sandals.  If you use a stepladder:  Make sure that it is fully opened. Do not climb a closed stepladder.  Make sure that both sides of the stepladder are locked into place.  Ask someone to hold it for you, if possible.  Clearly mark and make sure that you can see:  Any grab bars or handrails.  First and last steps.  Where the  edge of each step is.  Use tools that help you move around (mobility aids) if they are needed. These include:  Canes.  Walkers.  Scooters.  Crutches.  Turn on the lights when you go into a dark area. Replace any light bulbs as soon as they burn out.  Set up your furniture so you have a clear path. Avoid moving your furniture around.  If any of your floors are uneven, fix them.  If there are any pets around you, be aware of where they are.  Review your medicines with your doctor. Some medicines can make you feel dizzy. This can increase your chance of falling. Ask your doctor what other things that you can do to help prevent falls. This information is not intended to replace advice given to you by your health care provider. Make sure you discuss any questions you have with your health care provider. Document Released: 08/29/2009 Document Revised: 04/09/2016 Document Reviewed: 12/07/2014 Elsevier Interactive Patient Education  2017 [employer].

## 2018-08-17 NOTE — Progress Notes (Addendum)
Subjective:     Patient ID: Catherine [name], female   DOB: 05/09/1946, 72 y.o.   MRN: [id number]  FU DM- this am was 180 fasting, her average glucose has been 160's. If she sleeps poorly they go up. If she only eats snacks instead of dinner, her am glucose are in the 150's-160's.  Denies having any hypoglycemia episodes. Pt's has put a goal for her glucose to be at 126 or less, and is working on this.  Bp's at home 109-119/ upper 55-60's  Review of Systems  HENT: Negative.   Cardiovascular: Negative for chest pain, palpitations and leg swelling.  Endocrine: Negative for polyphagia.  Genitourinary: Negative for dysuria and enuresis.  No Known Allergies   Current Outpatient Medications:  .  Ascorbic Acid (VITAMIN C) 1000 MG tablet, Take 1,000 mg by mouth daily., Disp: , Rfl:  .  aspirin 81 MG tablet, Take 81 mg by mouth daily., Disp: , Rfl:  .  atorvastatin (LIPITOR) 10 MG tablet, Take 10 mg by mouth daily., Disp: , Rfl:  .  beta carotene w/minerals (OCUVITE) tablet, Take 1 tablet by mouth daily., Disp: , Rfl:  .  calcium citrate-vitamin D (CITRACAL+D) 315-200 MG-UNIT tablet, Take 1 tablet by mouth 2 (two) times daily., Disp: , Rfl:  .  Cyanocobalamin (VITAMIN B 12 PO), Take 1,000 mg by mouth daily., Disp: , Rfl:  .  ferrous sulfate 325 (65 FE) MG tablet, Take 1 tablet (325 mg total) by mouth 2 (two) times daily with a meal., Disp: 60 tablet, Rfl: 0 .  glimepiride (AMARYL) 4 MG tablet, Take 1 tablet (4 mg total) by mouth daily with breakfast., Disp: 30 tablet, Rfl: 0 .  levothyroxine (SYNTHROID, LEVOTHROID) 50 MCG tablet, Take 112 mcg by mouth daily before breakfast. , Disp: , Rfl:  .  linagliptin (TRADJENTA) 5 MG TABS tablet, Take 1 tablet (5 mg total) by mouth daily. For diabetes, Disp: 30 tablet, Rfl: 0 .  lisinopril (PRINIVIL,ZESTRIL) 10 MG tablet, Take 10 mg by mouth daily., Disp: , Rfl:  .  Multiple Vitamin (MULTIVITAMIN WITH MINERALS) TABS tablet, Take 1 tablet by mouth daily.,  Disp: , Rfl:  .  Omega-3 Fatty Acids (FISH OIL) 1000 MG CAPS, Take by mouth., Disp: , Rfl:  .  Polyethylene Glycol 3350-GRX POWD, Take by mouth., Disp: , Rfl:  .  Wheat Dextrin (BENEFIBER DRINK MIX PO), Take by mouth., Disp: , Rfl:      Objective:   Physical Exam  Constitutional: She is well-developed, well-nourished, and in no distress.  HENT:  Head: Normocephalic.  Eyes: Pupils are equal, round, and reactive to light. Conjunctivae are normal. No scleral icterus.  Neck: Normal range of motion. Neck supple.  Cardiovascular: Normal rate, regular rhythm and intact distal pulses.  No murmur heard. Pulmonary/Chest: Effort normal and breath sounds normal. No respiratory distress.  Abdominal: Soft.  Musculoskeletal: She exhibits no edema.  Lymphadenopathy:    She has no cervical adenopathy.  Neurological: She is alert.  Skin: Skin is warm and dry. No rash noted.  Psychiatric: Mood, memory, affect and judgment normal.  Vitals reviewed.  Today's Vitals   08/17/18 1636  BP: 116/62  Pulse: 74  Temp: 98.2 F (36.8 C)  TempSrc: Oral  Weight: 172 lb 4.8 oz (78.2 kg)  Height: 5' 4.5" (1.638 m)   Body mass index is 29.12 kg/m. Physical Exam  Constitutional: She is well-developed, well-nourished, and in no distress.  HENT:  Head: Normocephalic.  Eyes: Pupils are equal, round,  and reactive to light. Conjunctivae are normal. No scleral icterus.  Neck: Normal range of motion. Neck supple.  Cardiovascular: Normal rate, regular rhythm and intact distal pulses.  No murmur heard. Pulmonary/Chest: Effort normal and breath sounds normal. No respiratory distress.  Abdominal: Soft.  Musculoskeletal: She exhibits no edema.  Lymphadenopathy:    She has no cervical adenopathy.  Neurological: She is alert.  Skin: Skin is warm and dry. No rash noted.  Psychiatric: Mood, memory, affect and judgment normal.  Vitals reviewed.     Assessment:    1-HTN- stable 2- DM type 2, uncontrolled-  chronic. We will see what her HGBA1C results are before changing her medication.    Plan:    Labs ordered as noted. FU in 3 months

## 2018-08-18 LAB — CMP14 + ANION GAP
ALT: 17 IU/L (ref 0–32)
AST: 17 IU/L (ref 0–40)
Albumin/Globulin Ratio: 2 (ref 1.2–2.2)
Albumin: 4.7 g/dL (ref 3.5–4.8)
Alkaline Phosphatase: 67 IU/L (ref 39–117)
Anion Gap: 16 mmol/L (ref 10.0–18.0)
BILIRUBIN TOTAL: 0.6 mg/dL (ref 0.0–1.2)
BUN / CREAT RATIO: 14 (ref 12–28)
BUN: 24 mg/dL (ref 8–27)
CHLORIDE: 100 mmol/L (ref 96–106)
CO2: 24 mmol/L (ref 20–29)
Calcium: 10.2 mg/dL (ref 8.7–10.3)
Creatinine, Ser: 1.67 mg/dL — ABNORMAL HIGH (ref 0.57–1.00)
GFR calc Af Amer: 35 mL/min/{1.73_m2} — ABNORMAL LOW (ref >59)
GFR calc non Af Amer: 31 mL/min/{1.73_m2} — ABNORMAL LOW (ref >59)
GLOBULIN, TOTAL: 2.3 g/dL (ref 1.5–4.5)
Glucose: 202 mg/dL — ABNORMAL HIGH (ref 65–99)
Potassium: 5 mmol/L (ref 3.5–5.2)
SODIUM: 140 mmol/L (ref 134–144)
Total Protein: 7 g/dL (ref 6.0–8.5)

## 2018-08-18 LAB — CBC
HEMATOCRIT: 32.8 % — AB (ref 34.0–46.6)
Hemoglobin: 10.8 g/dL — ABNORMAL LOW (ref 11.1–15.9)
MCH: 29.3 pg (ref 26.6–33.0)
MCHC: 32.9 g/dL (ref 31.5–35.7)
MCV: 89 fL (ref 79–97)
PLATELETS: 194 10*3/uL (ref 150–450)
RBC: 3.69 x10E6/uL — ABNORMAL LOW (ref 3.77–5.28)
RDW: 13.4 % (ref 12.3–15.4)
WBC: 3.5 10*3/uL (ref 3.4–10.8)

## 2018-08-18 LAB — HEMOGLOBIN A1C
ESTIMATED AVERAGE GLUCOSE: 192 mg/dL
Hgb A1c MFr Bld: 8.3 % — ABNORMAL HIGH (ref 4.8–5.6)

## 2018-08-18 LAB — LIPID PANEL
CHOLESTEROL TOTAL: 150 mg/dL (ref 100–199)
Chol/HDL Ratio: 2.8 ratio (ref 0.0–4.4)
HDL: 54 mg/dL (ref >39)
LDL CALC: 81 mg/dL (ref 0–99)
Triglycerides: 73 mg/dL (ref 0–149)
VLDL Cholesterol Cal: 15 mg/dL (ref 5–40)

## 2018-08-19 ENCOUNTER — Other Ambulatory Visit: Payer: Self-pay | Admitting: Internal Medicine

## 2018-08-19 DIAGNOSIS — R748 Abnormal levels of other serum enzymes: Secondary | ICD-10-CM

## 2018-08-19 NOTE — Progress Notes (Signed)
BNP

## 2018-09-15 DIAGNOSIS — Z1231 Encounter for screening mammogram for malignant neoplasm of breast: Secondary | ICD-10-CM | POA: Diagnosis not present

## 2018-09-19 DIAGNOSIS — E1122 Type 2 diabetes mellitus with diabetic chronic kidney disease: Secondary | ICD-10-CM | POA: Diagnosis not present

## 2018-09-19 DIAGNOSIS — D649 Anemia, unspecified: Secondary | ICD-10-CM | POA: Diagnosis not present

## 2018-09-19 DIAGNOSIS — E785 Hyperlipidemia, unspecified: Secondary | ICD-10-CM | POA: Diagnosis not present

## 2018-09-19 DIAGNOSIS — N183 Chronic kidney disease, stage 3 (moderate): Secondary | ICD-10-CM | POA: Diagnosis not present

## 2018-09-19 DIAGNOSIS — I129 Hypertensive chronic kidney disease with stage 1 through stage 4 chronic kidney disease, or unspecified chronic kidney disease: Secondary | ICD-10-CM | POA: Diagnosis not present

## 2018-10-05 ENCOUNTER — Other Ambulatory Visit: Payer: Self-pay

## 2018-10-29 ENCOUNTER — Other Ambulatory Visit: Payer: Self-pay | Admitting: Nurse Practitioner

## 2018-11-03 ENCOUNTER — Other Ambulatory Visit: Payer: Self-pay | Admitting: Nurse Practitioner

## 2018-11-17 ENCOUNTER — Ambulatory Visit (INDEPENDENT_AMBULATORY_CARE_PROVIDER_SITE_OTHER): Payer: Medicare Other | Admitting: Internal Medicine

## 2018-11-17 ENCOUNTER — Encounter: Payer: Self-pay | Admitting: Internal Medicine

## 2018-11-17 VITALS — BP 120/62 | HR 74 | Temp 98.0°F | Ht 64.5 in | Wt 183.0 lb

## 2018-11-17 DIAGNOSIS — I10 Essential (primary) hypertension: Secondary | ICD-10-CM

## 2018-11-17 DIAGNOSIS — E1165 Type 2 diabetes mellitus with hyperglycemia: Secondary | ICD-10-CM

## 2018-11-17 NOTE — Patient Instructions (Addendum)
TRY TO AVOID BREAD AND CRACKERS, AND USE LETTUCE INSTEAD OF THE BUN, AND CELERY OR CUCUMBERS INSTEAD OF CRACKERS.    AVOID CUTTIES OR APPLES, BANANAS, PEARS, CANTALOUPE, WATER MELLOW, AND EAT ANY KIND OF BERRIES INSTEAD, ABOUT 3/4 CUP, AND SNACK ON 11 ALMONDS OR CASHEWS INSTEAD OF CRACKERS OR CARBS.   FOR DINNER SALAD WITH SOME KIND OF NEAT OR FISH, AND NO CARBS.   TRY VINEGAR AND OLIVE OIL FOR DRESSING   USE  GREEN PEPPERS  TRY THE GREEN ONION TAILS INSTEAD OF OTHER ONIONS WHICH ARE HIGHER IN CARBS.  TRY COLIFLOWER MASHED POTATOES

## 2018-11-17 NOTE — Progress Notes (Signed)
Subjective:     Patient ID: Catherine SpeckingBarbara P [name] , female    DOB: [birth year] , 73 y.o.   MRN: [id number]   Chief Complaint  Patient presents with  . Diabetes    HPI  Glucose has been 181- 236 which was after eating.  Has made major changes in her diet, and has been eating lighter at dinner. Very little soda Breakfast- toast with PB, coffe with hazer nut sugar free creamer, diet green tea throughout the day. Has been doing aerobics 10 minutes each day.  Lunch- Fast food Mal[place]urkey sandwich, 2 curley fries, water or tea with no sugar Snack- 1 cutie or 1 apple Dinner-  cracker with PB or plain, of water. If she eat this her glucose in the am are better. If she has a meal they are high the next am. Past Medical History:  Diagnosis Date  . Diabetes mellitus without complication (HCC)   . Hypertension   . Thyroid disease      Family History  Problem Relation Age of Onset  . Diabetes Mellitus II Mother   . Stroke Mother   . Diabetes Mellitus II Sister   . Hypertension Sister      Current Outpatient Medications:  .  Ascorbic Acid (VITAMIN C) 1000 MG tablet, Take 1,000 mg by mouth daily., Disp: , Rfl:  .  aspirin 81 MG tablet, Take 81 mg by mouth daily., Disp: , Rfl:  .  atorvastatin (LIPITOR) 10 MG tablet, Take 10 mg by mouth daily., Disp: , Rfl:  .  beta carotene w/minerals (OCUVITE) tablet, Take 1 tablet by mouth daily., Disp: , Rfl:  .  calcium citrate-vitamin D (CITRACAL+D) 315-200 MG-UNIT tablet, Take 1 tablet by mouth 2 (two) times daily., Disp: , Rfl:  .  Cyanocobalamin (VITAMIN B 12 PO), Take 1,000 mg by mouth daily., Disp: , Rfl:  .  ferrous sulfate 325 (65 FE) MG tablet, Take 1 tablet (325 mg total) by mouth 2 (two) times daily with a meal., Disp: 60 tablet, Rfl: 0 .  glimepiride (AMARYL) 4 MG tablet, Take 1 tablet (4 mg total) by mouth daily with breakfast., Disp: 30 tablet, Rfl: 0 .  levothyroxine (SYNTHROID, LEVOTHROID) 50 MCG tablet, Take 112 mcg by mouth daily before  breakfast. , Disp: , Rfl:  .  linagliptin (TRADJENTA) 5 MG TABS tablet, Take 1 tablet (5 mg total) by mouth daily. For diabetes, Disp: 30 tablet, Rfl: 0 .  lisinopril (PRINIVIL,ZESTRIL) 10 MG tablet, Take 10 mg by mouth daily., Disp: , Rfl:  .  Multiple Vitamin (MULTIVITAMIN WITH MINERALS) TABS tablet, Take 1 tablet by mouth daily., Disp: , Rfl:  .  Omega-3 Fatty Acids (FISH OIL) 1000 MG CAPS, Take by mouth., Disp: , Rfl:  .  Polyethylene Glycol 3350-GRX POWD, Take by mouth., Disp: , Rfl:  .  PROCTOSOL HC 2.5 % rectal cream, APPLY RECTALLY 4 TIMES A DAY FOR HEMORRHOIDS, Disp: 56.7 g, Rfl: 3 .  Wheat Dextrin (BENEFIBER DRINK MIX PO), Take by mouth., Disp: , Rfl:    No Known Allergies   Review of Systems  Constitutional: Negative for appetite change and diaphoresis.  Respiratory: Negative for chest tightness and shortness of breath.   Cardiovascular: Negative for chest pain, palpitations and leg swelling.  Gastrointestinal: Negative for constipation, diarrhea and nausea.  Musculoskeletal:       Has swelling of L  finger from jamming it years ago, and gets aches from it.   Skin: Negative for wound.  Neurological: Negative for  numbness.     Today's Vitals   11/17/18 0837  BP: 120/62  Pulse: 74  Temp: 98 F (36.7 C)  TempSrc: Oral  SpO2: 96%  Weight: 183 lb (83 kg)  Height: 5' 4.5" (1.638 m)   Body mass index is 30.93 kg/m.   Objective:  Physical Exam   Constitutional: She is oriented to person, place, and time. She appears well-developed and well-nourished. No distress.  HENT:  Head: Normocephalic and atraumatic.  Right Ear: External ear normal.  Left Ear: External ear normal.  Nose: Nose normal.  Eyes: Conjunctivae are normal. Right eye exhibits no discharge. Left eye exhibits no discharge. No scleral icterus.  Neck: Neck supple. No thyromegaly present.  No carotid bruits bilaterally  Cardiovascular: Normal rate and regular rhythm.  2-3/6  murmur heard. Pulmonary/Chest:  Effort normal and breath sounds normal. No respiratory distress.  Musculoskeletal: Normal range of motion. She exhibits no edema.  Lymphadenopathy:    She has no cervical adenopathy.  Neurological: She is alert and oriented to person, place, and time.  Skin: Skin is warm and dry. Capillary refill takes less than 2 seconds. No rash noted. She is not diaphoretic.  Psychiatric: She has a normal mood and affect. Her behavior is normal. Judgment and thought content normal.  Nursing note reviewed.  Assessment And Plan:    1. Uncontrolled type 2 diabetes mellitus with hyperglycemia (HCC)- not well controlled.   2. Essential hypertension- stable. May continue same meds.  Labs ordered as noted FU 1 month to see how she is doing with her diet cutting down carbs which she was instructed verbally and in witting.     Fortune Torosian RODRIGUEZ-SOUTHWORTH, PA-C

## 2018-11-18 LAB — CMP14 + ANION GAP
ALK PHOS: 74 IU/L (ref 39–117)
ALT: 21 IU/L (ref 0–32)
ANION GAP: 15 mmol/L (ref 10.0–18.0)
AST: 15 IU/L (ref 0–40)
Albumin/Globulin Ratio: 1.9 (ref 1.2–2.2)
Albumin: 4.3 g/dL (ref 3.5–4.8)
BUN/Creatinine Ratio: 23 (ref 12–28)
BUN: 28 mg/dL — ABNORMAL HIGH (ref 8–27)
Bilirubin Total: 0.4 mg/dL (ref 0.0–1.2)
CO2: 20 mmol/L (ref 20–29)
CREATININE: 1.24 mg/dL — AB (ref 0.57–1.00)
Calcium: 10 mg/dL (ref 8.7–10.3)
Chloride: 103 mmol/L (ref 96–106)
GFR calc Af Amer: 50 mL/min/{1.73_m2} — ABNORMAL LOW (ref >59)
GFR calc non Af Amer: 44 mL/min/{1.73_m2} — ABNORMAL LOW (ref >59)
GLUCOSE: 231 mg/dL — AB (ref 65–99)
Globulin, Total: 2.3 g/dL (ref 1.5–4.5)
Potassium: 4.7 mmol/L (ref 3.5–5.2)
Sodium: 138 mmol/L (ref 134–144)
Total Protein: 6.6 g/dL (ref 6.0–8.5)

## 2018-11-18 LAB — CBC
HEMATOCRIT: 29.8 % — AB (ref 34.0–46.6)
Hemoglobin: 10.4 g/dL — ABNORMAL LOW (ref 11.1–15.9)
MCH: 30.1 pg (ref 26.6–33.0)
MCHC: 34.9 g/dL (ref 31.5–35.7)
MCV: 86 fL (ref 79–97)
PLATELETS: 188 10*3/uL (ref 150–450)
RBC: 3.46 x10E6/uL — AB (ref 3.77–5.28)
RDW: 12.7 % (ref 12.3–15.4)
WBC: 4 10*3/uL (ref 3.4–10.8)

## 2018-11-18 LAB — HEMOGLOBIN A1C
Est. average glucose Bld gHb Est-mCnc: 203 mg/dL
Hgb A1c MFr Bld: 8.7 % — ABNORMAL HIGH (ref 4.8–5.6)

## 2018-11-29 NOTE — Progress Notes (Signed)
She has chronic kidney disease and she would need Ozempic to help

## 2018-12-14 ENCOUNTER — Encounter: Payer: Self-pay | Admitting: Nurse Practitioner

## 2018-12-14 ENCOUNTER — Ambulatory Visit (INDEPENDENT_AMBULATORY_CARE_PROVIDER_SITE_OTHER): Payer: Medicare Other | Admitting: Nurse Practitioner

## 2018-12-14 VITALS — BP 120/64 | HR 76 | Temp 98.0°F | Ht 65.0 in | Wt 178.4 lb

## 2018-12-14 DIAGNOSIS — E119 Type 2 diabetes mellitus without complications: Secondary | ICD-10-CM | POA: Diagnosis not present

## 2018-12-14 DIAGNOSIS — H00014 Hordeolum externum left upper eyelid: Secondary | ICD-10-CM | POA: Insufficient documentation

## 2018-12-14 HISTORY — DX: Hordeolum externum left upper eyelid: H00.014

## 2018-12-14 MED ORDER — BACITRACIN-POLYMYXIN B 500-10000 UNIT/GM OP OINT: TOPICAL_OINTMENT | Freq: Two times a day (BID) | OPHTHALMIC | 0 refills | Status: AC

## 2018-12-14 NOTE — Patient Instructions (Signed)

## 2018-12-14 NOTE — Progress Notes (Signed)
Subjective:     Patient ID: Governor SpeckingBarbara P Hey , female    DOB: [birth year] , 73 y.o.   MRN: [id number]   Chief Complaint  Patient presents with  . Diabetes    HPI  She is making changes with her diet and eating more vegetables and exercising more.  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  Diabetes  She presents for her follow-up diabetic visit. She has type 2 diabetes mellitus. Her disease course has been stable. Pertinent negatives for hypoglycemia include no confusion, dizziness, headaches or nervousness/anxiousness. Pertinent negatives for diabetes include no blurred vision, no chest pain, no fatigue, no polydipsia, no polyphagia and no polyuria. Risk factors for coronary artery disease include sedentary lifestyle. Current diabetic treatment includes oral agent (dual therapy). She is compliant with treatment all of the time. She is following a generally healthy diet. When asked about meal planning, she reported none. She participates in exercise daily (she is line dancing sometimes a couple times a day). (181-195 (ate something bad)   Today 180.  ) An ACE inhibitor/angiotensin II receptor blocker is being taken. She does not see a podiatrist.Eye exam is current.     Past Medical History:  Diagnosis Date  . Diabetes mellitus without complication (HCC)   . Hypertension   . Thyroid disease      Family History  Problem Relation Age of Onset  . Diabetes Mellitus II Mother   . Stroke Mother   . Diabetes Mellitus II Sister   . Hypertension Sister      Current Outpatient Medications:  .  Ascorbic Acid (VITAMIN C) 1000 MG tablet, Take 1,000 mg by mouth daily., Disp: , Rfl:  .  aspirin 81 MG tablet, Take 81 mg by mouth daily., Disp: , Rfl:  .  atorvastatin (LIPITOR) 10 MG tablet, Take 10 mg by mouth daily., Disp: , Rfl:  .  beta carotene w/minerals (OCUVITE) tablet, Take 1 tablet by mouth daily., Disp: , Rfl:  .  calcium citrate-vitamin D (CITRACAL+D) 315-200 MG-UNIT tablet,  Take 1 tablet by mouth 2 (two) times daily., Disp: , Rfl:  .  Cyanocobalamin (VITAMIN B 12 PO), Take 1,000 mg by mouth daily., Disp: , Rfl:  .  ferrous sulfate 325 (65 FE) MG tablet, TAKE 1 TABLET BY MOUTH TWICE A DAY, Disp: 180 tablet, Rfl: 1 .  glimepiride (AMARYL) 4 MG tablet, Take 1 tablet (4 mg total) by mouth daily with breakfast., Disp: 30 tablet, Rfl: 0 .  levothyroxine (SYNTHROID, LEVOTHROID) 50 MCG tablet, Take 112 mcg by mouth daily before breakfast. , Disp: , Rfl:  .  linagliptin (TRADJENTA) 5 MG TABS tablet, Take 1 tablet (5 mg total) by mouth daily. For diabetes, Disp: 30 tablet, Rfl: 0 .  lisinopril (PRINIVIL,ZESTRIL) 10 MG tablet, Take 10 mg by mouth daily., Disp: , Rfl:  .  Multiple Vitamin (MULTIVITAMIN WITH MINERALS) TABS tablet, Take 1 tablet by mouth daily., Disp: , Rfl:  .  Omega-3 Fatty Acids (FISH OIL) 1000 MG CAPS, Take by mouth., Disp: , Rfl:  .  PROCTOSOL HC 2.5 % rectal cream, APPLY RECTALLY 4 TIMES A DAY FOR HEMORRHOIDS, Disp: 56.7 g, Rfl: 3 .  Wheat Dextrin (BENEFIBER DRINK MIX PO), Take by mouth., Disp: , Rfl:    No Known Allergies   Review of Systems  Constitutional: Negative.  Negative for fatigue.  Eyes: Negative for blurred vision.  Respiratory: Negative.  Negative for cough.   Cardiovascular: Negative.  Negative for chest pain, palpitations and leg  swelling.  Gastrointestinal: Negative.   Endocrine: Negative.  Negative for polydipsia, polyphagia and polyuria.  Skin: Negative.   Neurological: Negative.  Negative for dizziness and headaches.  Psychiatric/Behavioral: Negative for confusion. The patient is not nervous/anxious.      Today's Vitals   12/14/18 1044  BP: 120/64  Pulse: 76  Temp: 98 F (36.7 C)  TempSrc: Oral  SpO2: 92%  Weight: 178 lb 6.4 oz (80.9 kg)  Height: 5\' 5"  (1.651 m)  PainSc: 0-No pain   Body mass index is 29.69 kg/m.   Objective:  Physical Exam Constitutional:      Appearance: She is well-developed.  Neck:      Musculoskeletal: Normal range of motion and neck supple.  Cardiovascular:     Rate and Rhythm: Normal rate and regular rhythm.     Heart sounds: Normal heart sounds. No murmur.  Pulmonary:     Effort: Pulmonary effort is normal.     Breath sounds: Normal breath sounds.  Chest:     Chest wall: No tenderness.  Musculoskeletal: Normal range of motion.  Skin:    General: Skin is warm and dry.     Capillary Refill: Capillary refill takes less than 2 seconds.  Neurological:     General: No focal deficit present.     Mental Status: She is alert and oriented to person, place, and time.  Psychiatric:        Mood and Affect: Mood normal.         Assessment And Plan:     1. Type 2 diabetes mellitus without complication, without long-term current use of insulin (HCC)  She is making many changes with her lifestyle and caring for herself.    Blood sugars are improving now 180's.    Continue with current medications   I am proud of her progress and encouraged her to continue what she is doing before considering starting her on Ozempic  2. Hordeolum externum of left upper eyelid  Left upper eyelid with swelling and firm area to outer area  Apply warm compress and will treat with polymyxin eye ointment.   - bacitracin-polymyxin b (POLYSPORIN) ophthalmic ointment; Place into both eyes 2 (two) times daily for 10 days. Place a 1/2 inch ribbon of ointment into the lower eyelid.  Dispense: 3.5 g; Refill: 0     Arnette FeltsJanece Khyli Swaim, FNP

## 2019-01-02 ENCOUNTER — Telehealth: Payer: Self-pay

## 2019-01-02 NOTE — Telephone Encounter (Signed)
-----   Message from Garey Ham, [place] sent at 12/06/2018  7:20 AM EST ----- Please call pt and inform her that her kidney function is still slow and her DM is a little worse. [name] recommends pt to be placed on an injectable medication once a week that will hot hurt her kidneys called Ozempic. If she is willing needs MA apt for education.

## 2019-01-02 NOTE — Telephone Encounter (Signed)
2nd attempt to give lab results. Unable to leave voicemail

## 2019-01-03 ENCOUNTER — Encounter: Payer: Self-pay | Admitting: Internal Medicine

## 2019-01-03 ENCOUNTER — Ambulatory Visit (INDEPENDENT_AMBULATORY_CARE_PROVIDER_SITE_OTHER): Payer: Medicare Other | Admitting: Internal Medicine

## 2019-01-03 ENCOUNTER — Telehealth: Payer: Self-pay

## 2019-01-03 VITALS — BP 110/66 | HR 80 | Temp 98.2°F | Ht 65.4 in | Wt 178.6 lb

## 2019-01-03 DIAGNOSIS — E038 Other specified hypothyroidism: Secondary | ICD-10-CM

## 2019-01-03 DIAGNOSIS — E1165 Type 2 diabetes mellitus with hyperglycemia: Secondary | ICD-10-CM | POA: Diagnosis not present

## 2019-01-03 NOTE — Progress Notes (Signed)
Subjective:     Patient ID: Catherine [name] , female    DOB: 1946/09/29 , 73 y.o.   MRN: [id number]   Chief Complaint  Patient presents with  . Hypothyroidism  . Diabetes    HPI She is here for follow DM.  Fasting glucose 182-221 Post prandial low has been as low as 170. She is willing to try the Ozempic. Denies known family hx of thyroid cancer.  She does not have any complaints.   Past Medical History:  Diagnosis Date  . Diabetes mellitus without complication (HCC)   . Hypertension   . Thyroid disease      Family History  Problem Relation Age of Onset  . Diabetes Mellitus II Mother   . Stroke Mother   . Diabetes Mellitus II Sister   . Hypertension Sister      Current Outpatient Medications:  .  Ascorbic Acid (VITAMIN C) 1000 MG tablet, Take 1,000 mg by mouth daily., Disp: , Rfl:  .  aspirin 81 MG tablet, Take 81 mg by mouth daily., Disp: , Rfl:  .  atorvastatin (LIPITOR) 10 MG tablet, Take 10 mg by mouth daily., Disp: , Rfl:  .  beta carotene w/minerals (OCUVITE) tablet, Take 1 tablet by mouth daily., Disp: , Rfl:  .  calcium citrate-vitamin D (CITRACAL+D) 315-200 MG-UNIT tablet, Take 1 tablet by mouth 2 (two) times daily., Disp: , Rfl:  .  Cyanocobalamin (VITAMIN B 12 PO), Take 1,000 mg by mouth daily., Disp: , Rfl:  .  ferrous sulfate 325 (65 FE) MG tablet, TAKE 1 TABLET BY MOUTH TWICE A DAY, Disp: 180 tablet, Rfl: 1 .  glimepiride (AMARYL) 4 MG tablet, Take 1 tablet (4 mg total) by mouth daily with breakfast., Disp: 30 tablet, Rfl: 0 .  levothyroxine (SYNTHROID, LEVOTHROID) 50 MCG tablet, Take 112 mcg by mouth daily before breakfast. , Disp: , Rfl:  .  linagliptin (TRADJENTA) 5 MG TABS tablet, Take 1 tablet (5 mg total) by mouth daily. For diabetes, Disp: 30 tablet, Rfl: 0 .  lisinopril-hydrochlorothiazide (PRINZIDE,ZESTORETIC) 10-12.5 MG tablet, Take 1 tablet by mouth daily., Disp: , Rfl:  .  Multiple Vitamin (MULTIVITAMIN WITH MINERALS) TABS tablet, Take 1 tablet  by mouth daily., Disp: , Rfl:  .  Omega-3 Fatty Acids (FISH OIL) 1000 MG CAPS, Take by mouth., Disp: , Rfl:  .  PROCTOSOL HC 2.5 % rectal cream, APPLY RECTALLY 4 TIMES A DAY FOR HEMORRHOIDS, Disp: 56.7 g, Rfl: 3 .  Wheat Dextrin (BENEFIBER DRINK MIX PO), Take by mouth., Disp: , Rfl:    No Known Allergies   Review of Systems  Constitutional: Negative for appetite change, chills, diaphoresis and fever.  HENT: Sinus pain: free t4.   Respiratory: Negative for cough, chest tightness and shortness of breath.   Cardiovascular: Negative for chest pain, palpitations and leg swelling.  Gastrointestinal: Negative for constipation.  Endocrine: Negative for cold intolerance, heat intolerance, polydipsia and polyphagia.  Genitourinary: Negative for difficulty urinating.  Musculoskeletal: Negative for myalgias.  Skin: Negative for rash.  Neurological: Negative for numbness and headaches.  Hematological: Negative for adenopathy.  Psychiatric/Behavioral: Negative for sleep disturbance.     Today's Vitals   01/03/19 1013  BP: 110/66  Pulse: 80  Temp: 98.2 F (36.8 C)  TempSrc: Oral  SpO2: 97%  Weight: 178 lb 9.6 oz (81 kg)  Height: 5' 5.4" (1.661 m)  PainSc: 0-No pain   Body mass index is 29.36 kg/m.   Objective:  Physical Exam  Constitutional: She is oriented to person, place, and time. She appears well-developed and well-nourished. No distress.  HENT:  Head: Normocephalic and atraumatic.  Right Ear: External ear normal.  Left Ear: External ear normal.  Nose: Nose normal.  Eyes: Conjunctivae are normal. Right eye exhibits no discharge. Left eye exhibits no discharge. No scleral icterus.  Neck: Neck supple. No thyromegaly present.  No carotid bruits bilaterally  Cardiovascular: Normal rate and regular rhythm.  No murmur heard. Pulmonary/Chest: Effort normal and breath sounds normal. No respiratory distress.  Musculoskeletal: Normal range of motion. She exhibits no edema.   Lymphadenopathy:    She has no cervical adenopathy.  Neurological: She is alert and oriented to person, place, and time.  Skin: Skin is warm and dry. Capillary refill takes less than 2 seconds. No rash noted. She is not diaphoretic.  Psychiatric: She has a normal mood and affect. Her behavior is normal. Judgment and thought content normal.  Nursing note reviewed.  Assessment And Plan:    1. Uncontrolled type 2 diabetes mellitus with hyperglycemia (HCC)-Ozempic education and gave herself her first injection. She will continue this weekly at 0.25 mg.  FU in 4 weeks with [name].  2. Other specified hypothyroidism- chronic. She will continue same dose    unless thyroid levels are abnormal.     Catherine Stracener RODRIGUEZ-SOUTHWORTH, PA-C

## 2019-01-03 NOTE — Telephone Encounter (Signed)
Patient came in for office visit today on 01/03/19 and seen [place] .[place] PA-C and she stated she is still taking Tradjenta. YRL,RMA

## 2019-01-04 ENCOUNTER — Ambulatory Visit: Payer: Medicare Other | Admitting: Nurse Practitioner

## 2019-01-17 ENCOUNTER — Ambulatory Visit (INDEPENDENT_AMBULATORY_CARE_PROVIDER_SITE_OTHER): Payer: Medicare Other | Admitting: Internal Medicine

## 2019-01-17 ENCOUNTER — Encounter: Payer: Self-pay | Admitting: Internal Medicine

## 2019-01-17 ENCOUNTER — Other Ambulatory Visit: Payer: Self-pay

## 2019-01-17 VITALS — BP 140/74 | HR 82 | Temp 98.1°F | Ht 65.4 in | Wt 176.8 lb

## 2019-01-17 DIAGNOSIS — L0201 Cutaneous abscess of face: Secondary | ICD-10-CM | POA: Diagnosis not present

## 2019-01-17 MED ORDER — DOXYCYCLINE HYCLATE 100 MG PO TABS: 100.0000 mg | ORAL_TABLET | Freq: Two times a day (BID) | ORAL | 0 refills | Status: DC

## 2019-01-17 NOTE — Progress Notes (Signed)
Subjective:     Patient ID: Catherine [name] , female    DOB: January 13, 1946 , 73 y.o.   MRN: [id number]   Chief Complaint  Patient presents with  . Rash    mouth-nose-    HPI   Onset of bump 2/19 and yesterday she sterilized a needle and poked it and drained puss.  She had L conjunctivitis a few weeks ago, and during that time she developed a bump on her R face, but now resolved and she still has a nodule under it and dark skin over it.  Yesterday she developed another bump on L lower lip,and now is larger and tender. Has no history of abscesses. 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   Past Medical History:  Diagnosis Date  . Diabetes mellitus without complication (HCC)   . Hypertension   . Thyroid disease      Family History  Problem Relation Age of Onset  . Diabetes Mellitus II Mother   . Stroke Mother   . Diabetes Mellitus II Sister   . Hypertension Sister      Current Outpatient Medications:  .  Ascorbic Acid (VITAMIN C) 1000 MG tablet, Take 1,000 mg by mouth daily., Disp: , Rfl:  .  aspirin 81 MG tablet, Take 81 mg by mouth daily., Disp: , Rfl:  .  atorvastatin (LIPITOR) 10 MG tablet, Take 10 mg by mouth daily., Disp: , Rfl:  .  beta carotene w/minerals (OCUVITE) tablet, Take 1 tablet by mouth daily., Disp: , Rfl:  .  calcium citrate-vitamin D (CITRACAL+D) 315-200 MG-UNIT tablet, Take 1 tablet by mouth 2 (two) times daily., Disp: , Rfl:  .  Cyanocobalamin (VITAMIN B 12 PO), Take 1,000 mg by mouth daily., Disp: , Rfl:  .  ferrous sulfate 325 (65 FE) MG tablet, TAKE 1 TABLET BY MOUTH TWICE A DAY, Disp: 180 tablet, Rfl: 1 .  glimepiride (AMARYL) 4 MG tablet, Take 1 tablet (4 mg total) by mouth daily with breakfast., Disp: 30 tablet, Rfl: 0 .  levothyroxine (SYNTHROID, LEVOTHROID) 50 MCG tablet, Take 112 mcg by mouth daily before breakfast. , Disp: , Rfl:  .  linagliptin (TRADJENTA) 5 MG TABS tablet, Take 1 tablet (5 mg  total) by mouth daily. For diabetes, Disp: 30 tablet, Rfl: 0 .  lisinopril-hydrochlorothiazide (PRINZIDE,ZESTORETIC) 10-12.5 MG tablet, Take 1 tablet by mouth daily., Disp: , Rfl:  .  Multiple Vitamin (MULTIVITAMIN WITH MINERALS) TABS tablet, Take 1 tablet by mouth daily., Disp: , Rfl:  .  Omega-3 Fatty Acids (FISH OIL) 1000 MG CAPS, Take by mouth., Disp: , Rfl:  .  PROCTOSOL HC 2.5 % rectal cream, APPLY RECTALLY 4 TIMES A DAY FOR HEMORRHOIDS, Disp: 56.7 g, Rfl: 3 .  Wheat Dextrin (BENEFIBER DRINK MIX PO), Take by mouth., Disp: , Rfl:    No Known Allergies   Review of Systems  Constitutional: Negative for chills, diaphoresis and fever.  Musculoskeletal: Negative for myalgias.  Skin: Positive for wound. Negative for rash.     Today's Vitals   01/17/19 1522  BP: 140/74  Pulse: 82  Temp: 98.1 F (36.7 C)  TempSrc: Oral  SpO2: 98%  Weight: 176 lb 12.8 oz (80.2 kg)  Height: 5' 5.4" (1.661 m)   Body mass index is 29.06 kg/m.   Objective:  Physical Exam Vitals signs and nursing note reviewed.  Constitutional:      Appearance: Normal appearance.  HENT:  Head: Normocephalic.     Right Ear: External ear normal.     Left Ear: External ear normal.     Nose: Nose normal.  Eyes:     General: No scleral icterus.    Conjunctiva/sclera: Conjunctivae normal.  Neck:     Musculoskeletal: Neck supple.  Pulmonary:     Effort: Pulmonary effort is normal.  Musculoskeletal: Normal range of motion.  Lymphadenopathy:     Cervical: No cervical adenopathy.  Skin:    General: Skin is warm and dry.     Comments: Has indurated 1x1.5 cm mass on the corner of L mouth and the skin is broken from her poking and squeezing it. Has a 1/4 x 1/4 induration on border of L lower lip. Both are tender, slightly warm and tender.   Neurological:     Mental Status: She is alert and oriented to person, place, and time.     Gait: Gait normal.  Psychiatric:        Mood and Affect: Mood normal.         Behavior: Behavior normal.        Thought Content: Thought content normal.        Judgment: Judgment normal.     Assessment And Plan:   1. Abscess of face- advised to use heat on abscess( see instructions)    I placed her on Doxy 100 mg bid x 10 days.   Fu prn.      Estiven Kohan RODRIGUEZ-SOUTHWORTH, PA-C

## 2019-01-17 NOTE — Patient Instructions (Signed)
  Use heat on abscess 2-3 times a day.   Skin Abscess  A skin abscess is an infected area of your skin that contains pus and other material. An abscess can happen in any part of your body. Some abscesses break open (rupture) on their own. Most continue to get worse unless they are treated. The infection can spread deeper into the body and into your blood, which can make you feel sick. A skin abscess is caused by germs that enter the skin through a cut or scrape. It can also be caused by blocked oil and sweat glands or infected hair follicles. This condition is usually treated by:  Draining the pus.  Taking antibiotic medicines.  Placing a warm, wet washcloth over the abscess. Follow these instructions at home: Medicines   Take over-the-counter and prescription medicines only as told by your doctor.  If you were prescribed an antibiotic medicine, take it as told by your doctor. Do not stop taking the antibiotic even if you start to feel better. Abscess care   If you have an abscess that has not drained, place a warm, clean, wet washcloth over the abscess several times a day. Do this as told by your doctor.  Follow instructions from your doctor about how to take care of your abscess. Make sure you: ? Cover the abscess with a bandage (dressing). ? Change your bandage or gauze as told by your doctor. ? Wash your hands with soap and water before you change the bandage or gauze. If you cannot use soap and water, use hand sanitizer.  Check your abscess every day for signs that the infection is getting worse. Check for: ? More redness, swelling, or pain. ? More fluid or blood. ? Warmth. ? More pus or a bad smell. General instructions  To avoid spreading the infection: ? Do not share personal care items, towels, or hot tubs with others. ? Avoid making skin-to-skin contact with other people.  Keep all follow-up visits as told by your doctor. This is important. Contact a doctor  if:  You have more redness, swelling, or pain around your abscess.  You have more fluid or blood coming from your abscess.  Your abscess feels warm when you touch it.  You have more pus or a bad smell coming from your abscess.  You have a fever.  Your muscles ache.  You have chills.  You feel sick. Get help right away if:  You have very bad (severe) pain.  You see red streaks on your skin spreading away from the abscess. Summary  A skin abscess is an infected area of your skin that contains pus and other material.  The abscess is caused by germs that enter the skin through a cut or scrape. It can also be caused by blocked oil and sweat glands or infected hair follicles.  Follow your doctor's instructions on caring for your abscess, taking medicines, preventing infections, and keeping follow-up visits. This information is not intended to replace advice given to you by your health care provider. Make sure you discuss any questions you have with your health care provider. Document Released: 04/20/2008 Document Revised: 12/16/2017 Document Reviewed: 12/16/2017 Elsevier Interactive Patient Education  2019 [employer].

## 2019-01-25 ENCOUNTER — Ambulatory Visit: Payer: Medicare Other | Admitting: Nurse Practitioner

## 2019-02-06 ENCOUNTER — Encounter: Payer: Self-pay | Admitting: Nurse Practitioner

## 2019-02-06 ENCOUNTER — Other Ambulatory Visit: Payer: Self-pay

## 2019-02-06 ENCOUNTER — Ambulatory Visit: Payer: Medicare Other | Admitting: Nurse Practitioner

## 2019-02-06 ENCOUNTER — Ambulatory Visit (INDEPENDENT_AMBULATORY_CARE_PROVIDER_SITE_OTHER): Payer: Medicare Other | Admitting: Nurse Practitioner

## 2019-02-06 VITALS — BP 120/60 | HR 86 | Temp 97.7°F | Ht 65.4 in | Wt 173.4 lb

## 2019-02-06 DIAGNOSIS — E119 Type 2 diabetes mellitus without complications: Secondary | ICD-10-CM

## 2019-02-06 DIAGNOSIS — I10 Essential (primary) hypertension: Secondary | ICD-10-CM

## 2019-02-06 DIAGNOSIS — E039 Hypothyroidism, unspecified: Secondary | ICD-10-CM

## 2019-02-06 MED ORDER — LISINOPRIL 10 MG PO TABS: 10.0000 mg | ORAL_TABLET | Freq: Every day | ORAL | 2 refills | Status: DC

## 2019-02-06 NOTE — Patient Instructions (Signed)
   STOP TAKING TRADJENTA  I HAVE CHANGED YOUR LISINOPRIL/HCTZ TO ONLY LISINOPRIL, WHEN YOU SEE NEPHROLOGY WE CAN SEE IF YOUR LIGHTHEADEDNESS AND LOW BLOOD PRESSURES ARE BETTER.

## 2019-02-06 NOTE — Progress Notes (Signed)
Subjective:     Patient ID: Catherine [name] , female    DOB: 1946-01-24 , 73 y.o.   MRN: [id number]   Chief Complaint  Patient presents with  . Diabetes    ozempic followup / feeling lightheaded when she stands/ tradjenta    HPI  She has been losing weight since starting Ozempic  Diabetes  She presents for her follow-up diabetic visit. She has type 2 diabetes mellitus. There are no hypoglycemic associated symptoms. Pertinent negatives for hypoglycemia include no dizziness or headaches. There are no diabetic associated symptoms. Pertinent negatives for diabetes include no blurred vision, no chest pain, no fatigue, no polydipsia, no polyphagia and no polyuria. There are no hypoglycemic complications. She is compliant with treatment all of the time. She rarely participates in exercise. (96- 216 - she had been drinking more orange juice with the 216 blood sugar. ) An ACE inhibitor/angiotensin II receptor blocker is being taken.     Past Medical History:  Diagnosis Date  . Diabetes mellitus without complication (HCC)   . Hypertension   . Thyroid disease      Family History  Problem Relation Age of Onset  . Diabetes Mellitus II Mother   . Stroke Mother   . Diabetes Mellitus II Sister   . Hypertension Sister      Current Outpatient Medications:  .  Ascorbic Acid (VITAMIN C) 1000 MG tablet, Take 1,000 mg by mouth daily., Disp: , Rfl:  .  aspirin 81 MG tablet, Take 81 mg by mouth daily., Disp: , Rfl:  .  atorvastatin (LIPITOR) 10 MG tablet, Take 10 mg by mouth daily., Disp: , Rfl:  .  beta carotene w/minerals (OCUVITE) tablet, Take 1 tablet by mouth daily., Disp: , Rfl:  .  calcium citrate-vitamin D (CITRACAL+D) 315-200 MG-UNIT tablet, Take 1 tablet by mouth 2 (two) times daily., Disp: , Rfl:  .  Cyanocobalamin (VITAMIN B 12 PO), Take 1,000 mg by mouth daily., Disp: , Rfl:  .  doxycycline (VIBRA-TABS) 100 MG tablet, Take 1 tablet (100 mg total) by mouth 2 (two) times daily., Disp:  20 tablet, Rfl: 0 .  ferrous sulfate 325 (65 FE) MG tablet, TAKE 1 TABLET BY MOUTH TWICE A DAY, Disp: 180 tablet, Rfl: 1 .  glimepiride (AMARYL) 4 MG tablet, Take 1 tablet (4 mg total) by mouth daily with breakfast., Disp: 30 tablet, Rfl: 0 .  levothyroxine (SYNTHROID, LEVOTHROID) 50 MCG tablet, Take 112 mcg by mouth daily before breakfast. , Disp: , Rfl:  .  linagliptin (TRADJENTA) 5 MG TABS tablet, Take 1 tablet (5 mg total) by mouth daily. For diabetes, Disp: 30 tablet, Rfl: 0 .  lisinopril-hydrochlorothiazide (PRINZIDE,ZESTORETIC) 10-12.5 MG tablet, Take 1 tablet by mouth daily., Disp: , Rfl:  .  Multiple Vitamin (MULTIVITAMIN WITH MINERALS) TABS tablet, Take 1 tablet by mouth daily., Disp: , Rfl:  .  Omega-3 Fatty Acids (FISH OIL) 1000 MG CAPS, Take by mouth., Disp: , Rfl:  .  PROCTOSOL HC 2.5 % rectal cream, APPLY RECTALLY 4 TIMES A DAY FOR HEMORRHOIDS, Disp: 56.7 g, Rfl: 3 .  Wheat Dextrin (BENEFIBER DRINK MIX PO), Take by mouth., Disp: , Rfl:    No Known Allergies   Review of Systems  Constitutional: Negative for fatigue.  Eyes: Negative for blurred vision.  Respiratory: Positive for cough (dry cough).   Cardiovascular: Negative.  Negative for chest pain, palpitations and leg swelling.  Endocrine: Negative for polydipsia, polyphagia and polyuria.  Neurological: Positive for light-headedness. Negative for  dizziness and headaches.     Today's Vitals   02/06/19 1446  BP: 120/60  Pulse: 86  Temp: 97.7 F (36.5 C)  TempSrc: Oral  SpO2: 97%  Weight: 173 lb 6.4 oz (78.7 kg)  Height: 5' 5.4" (1.661 m)   Body mass index is 28.5 kg/m.   Objective:  Physical Exam Vitals signs reviewed.  Constitutional:      Appearance: She is well-developed.  Neck:     Musculoskeletal: Normal range of motion and neck supple.  Cardiovascular:     Rate and Rhythm: Normal rate and regular rhythm.     Heart sounds: Normal heart sounds. No murmur.  Pulmonary:     Effort: Pulmonary effort is  normal.     Breath sounds: Normal breath sounds.  Chest:     Chest wall: No tenderness.  Musculoskeletal: Normal range of motion.  Skin:    General: Skin is warm and dry.     Capillary Refill: Capillary refill takes less than 2 seconds.  Neurological:     General: No focal deficit present.     Mental Status: She is alert and oriented to person, place, and time.  Psychiatric:        Mood and Affect: Mood normal.        Behavior: Behavior normal.        Thought Content: Thought content normal.        Judgment: Judgment normal.         Assessment And Plan:     1. Type 2 diabetes mellitus without complication, without long-term current use of insulin (HCC)  She is doing much better with the ozempic, she is to stop the tradjenta. - Hemoglobin A1c  2. Essential hypertension . B/P is controlled and lower, she is having intermittent lightheadedness will d/c lisinopril/hctz.  . CMP ordered to check renal function.  . The importance of regular exercise and dietary modification was stressed to the patient.  . Stressed importance of losing ten percent of her body weight to help with B/P control.  . The weight loss would help with decreasing cardiac and cancer risk as well.  - lisinopril (PRINIVIL,ZESTRIL) 10 MG tablet; Take 1 tablet (10 mg total) by mouth daily.  Dispense: 30 tablet; Refill: 2  3. Acquired hypothyroidism  Chronic, controlled  Continue with current medications - TSH - T4 - T3, free       [name], FNP

## 2019-02-07 LAB — HEMOGLOBIN A1C
Est. average glucose Bld gHb Est-mCnc: 192 mg/dL
Hgb A1c MFr Bld: 8.3 % — ABNORMAL HIGH (ref 4.8–5.6)

## 2019-02-07 LAB — T4: T4, Total: 9.5 ug/dL (ref 4.5–12.0)

## 2019-02-07 LAB — T3, FREE: T3, Free: 2 pg/mL (ref 2.0–4.4)

## 2019-02-07 LAB — TSH: TSH: 0.637 u[IU]/mL (ref 0.450–4.500)

## 2019-02-08 ENCOUNTER — Other Ambulatory Visit: Payer: Self-pay

## 2019-02-08 MED ORDER — PEN NEEDLES 32G X 4 MM MISC: 1.0000 | 2 refills | Status: DC

## 2019-02-15 ENCOUNTER — Telehealth: Payer: Self-pay

## 2019-02-15 NOTE — Telephone Encounter (Signed)
RETURNING CALL FROM PT SHE SAID IT WAS ABOUT HER INJECTION. CANT LEAVE VM ON HER NUMBER

## 2019-02-21 ENCOUNTER — Telehealth: Payer: Self-pay

## 2019-02-21 ENCOUNTER — Other Ambulatory Visit: Payer: Self-pay

## 2019-02-21 MED ORDER — SEMAGLUTIDE(0.25 OR 0.5MG/DOS) 2 MG/1.5ML ~~LOC~~ SOPN: 1.5000 mL | PEN_INJECTOR | SUBCUTANEOUS | 3 refills | Status: DC

## 2019-02-21 MED ORDER — SEMAGLUTIDE(0.25 OR 0.5MG/DOS) 2 MG/1.5ML ~~LOC~~ SOPN: 0.5000 mL | PEN_INJECTOR | SUBCUTANEOUS | 3 refills | Status: DC

## 2019-02-21 NOTE — Telephone Encounter (Signed)
OZEMPIC  

## 2019-02-22 ENCOUNTER — Encounter: Payer: Self-pay | Admitting: Nurse Practitioner

## 2019-03-10 ENCOUNTER — Telehealth: Payer: Self-pay

## 2019-03-10 NOTE — Telephone Encounter (Signed)
I called pt to see if she is taking Tradjenta 5mg  and she stated no she is on the glimepiride and ozempic. YRL,RMA

## 2019-03-13 NOTE — Telephone Encounter (Signed)
Okay I will remove from her medication list.

## 2019-04-12 ENCOUNTER — Telehealth: Payer: Self-pay

## 2019-04-12 ENCOUNTER — Other Ambulatory Visit: Payer: Self-pay | Admitting: Nurse Practitioner

## 2019-04-12 NOTE — Telephone Encounter (Signed)
error 

## 2019-05-02 ENCOUNTER — Other Ambulatory Visit: Payer: Self-pay | Admitting: Nurse Practitioner

## 2019-05-02 DIAGNOSIS — I10 Essential (primary) hypertension: Secondary | ICD-10-CM

## 2019-05-10 ENCOUNTER — Ambulatory Visit (INDEPENDENT_AMBULATORY_CARE_PROVIDER_SITE_OTHER): Payer: Medicare Other | Admitting: Nurse Practitioner

## 2019-05-10 ENCOUNTER — Other Ambulatory Visit: Payer: Self-pay

## 2019-05-10 ENCOUNTER — Encounter: Payer: Self-pay | Admitting: Nurse Practitioner

## 2019-05-10 VITALS — BP 122/68 | HR 93 | Temp 98.2°F | Ht 65.6 in | Wt 179.0 lb

## 2019-05-10 DIAGNOSIS — E039 Hypothyroidism, unspecified: Secondary | ICD-10-CM | POA: Diagnosis not present

## 2019-05-10 DIAGNOSIS — E119 Type 2 diabetes mellitus without complications: Secondary | ICD-10-CM

## 2019-05-10 DIAGNOSIS — I10 Essential (primary) hypertension: Secondary | ICD-10-CM | POA: Diagnosis not present

## 2019-05-10 NOTE — Progress Notes (Signed)
Subjective:     Patient ID: Catherine [name] , female    DOB: May 29, 1946 , 73 y.o.   MRN: [id number]   Chief Complaint  Patient presents with  . Diabetes    HPI  Diabetes She presents for her follow-up diabetic visit. She has type 2 diabetes mellitus. There are no hypoglycemic associated symptoms. Pertinent negatives for hypoglycemia include no dizziness, headaches or nervousness/anxiousness. Pertinent negatives for diabetes include no blurred vision, no chest pain, no fatigue, no polydipsia, no polyphagia and no polyuria. There are no hypoglycemic complications. She is compliant with treatment all of the time. She rarely participates in exercise. (Blood sugars are 85-119.  ) An ACE inhibitor/angiotensin II receptor blocker is being taken.     Past Medical History:  Diagnosis Date  . Diabetes mellitus without complication ([place])   . Hypertension   . Thyroid disease      Family History  Problem Relation Age of Onset  . Diabetes Mellitus II Mother   . Stroke Mother   . Diabetes Mellitus II Sister   . Hypertension Sister      Current Outpatient Medications:  .  Ascorbic Acid (VITAMIN C) 1000 MG tablet, Take 1,000 mg by mouth daily., Disp: , Rfl:  .  aspirin 81 MG tablet, Take 81 mg by mouth daily., Disp: , Rfl:  .  atorvastatin (LIPITOR) 10 MG tablet, Take 10 mg by mouth daily., Disp: , Rfl:  .  beta carotene w/minerals (OCUVITE) tablet, Take 1 tablet by mouth daily., Disp: , Rfl:  .  calcium citrate-vitamin D (CITRACAL+D) 315-200 MG-UNIT tablet, Take 1 tablet by mouth 2 (two) times daily., Disp: , Rfl:  .  Cyanocobalamin (VITAMIN B 12 PO), Take 1,000 mg by mouth daily., Disp: , Rfl:  .  ferrous sulfate 325 (65 FE) MG tablet, TAKE 1 TABLET BY MOUTH TWICE A DAY, Disp: 180 tablet, Rfl: 1 .  glimepiride (AMARYL) 4 MG tablet, Take 1 tablet (4 mg total) by mouth daily with breakfast., Disp: 30 tablet, Rfl: 0 .  Insulin Pen Needle (PEN NEEDLES) 32G X 4 MM MISC, 1 each by Does not  apply route once a week. Use as directed once per week with Ozempic, Disp: 30 each, Rfl: 2 .  levothyroxine (SYNTHROID) 112 MCG tablet, Take 112 mcg by mouth daily before breakfast. , Disp: , Rfl:  .  lisinopril (ZESTRIL) 10 MG tablet, TAKE 1 TABLET BY MOUTH EVERY DAY, Disp: 90 tablet, Rfl: 0 .  Multiple Vitamin (MULTIVITAMIN WITH MINERALS) TABS tablet, Take 1 tablet by mouth daily., Disp: , Rfl:  .  Omega-3 Fatty Acids (FISH OIL) 1000 MG CAPS, Take by mouth., Disp: , Rfl:  .  OZEMPIC, 0.25 OR 0.5 MG/DOSE, 2 MG/1.5ML SOPN, INJECT 0.5 MLS INTO THE SKIN ONCE A WEEK., Disp: 3 pen, Rfl: 2 .  PROCTOSOL HC 2.5 % rectal cream, APPLY RECTALLY 4 TIMES A DAY FOR HEMORRHOIDS, Disp: 56.7 g, Rfl: 3 .  Wheat Dextrin (BENEFIBER DRINK MIX PO), Take by mouth., Disp: , Rfl:    No Known Allergies   Review of Systems  Constitutional: Negative for fatigue and fever.  Eyes: Negative for blurred vision.  Respiratory: Negative for cough.   Cardiovascular: Negative for chest pain, palpitations and leg swelling.  Endocrine: Negative for polydipsia, polyphagia and polyuria.  Musculoskeletal: Negative.   Neurological: Negative for dizziness, light-headedness and headaches.  Psychiatric/Behavioral: Negative.  The patient is not nervous/anxious.      Today's Vitals   05/10/19 1002  BP:  122/68  Pulse: 93  Temp: 98.2 F (36.8 C)  TempSrc: Oral  Weight: 179 lb (81.2 kg)  Height: 5' 5.6" (1.666 m)  PainSc: 0-No pain   Body mass index is 29.24 kg/m.   Objective:  Physical Exam Vitals signs reviewed.  Constitutional:      Appearance: Normal appearance. She is well-developed.  Neck:     Musculoskeletal: Normal range of motion and neck supple.  Cardiovascular:     Rate and Rhythm: Normal rate and regular rhythm.     Pulses: Normal pulses.     Heart sounds: Normal heart sounds. No murmur.  Pulmonary:     Effort: Pulmonary effort is normal.     Breath sounds: Normal breath sounds.  Chest:     Chest wall:  No tenderness.  Musculoskeletal: Normal range of motion.  Skin:    General: Skin is warm and dry.     Capillary Refill: Capillary refill takes less than 2 seconds.  Neurological:     General: No focal deficit present.     Mental Status: She is alert and oriented to person, place, and time.  Psychiatric:        Mood and Affect: Mood normal.        Behavior: Behavior normal.        Thought Content: Thought content normal.        Judgment: Judgment normal.         Assessment And Plan:     1. Type 2 diabetes mellitus without complication, without long-term current use of insulin (HCC)  Chronic, improving  Continue doing well with ozempic  Blood sugars are well controlled  Will check HgbA1c - Hemoglobin A1c  2. Essential hypertension . B/P is well controlled . CMP ordered to check renal function.  . The importance of regular exercise and dietary modification was stressed to the patient.  . Stressed importance of losing ten percent of her body weight to help with B/P control.  . The weight loss would help with decreasing cardiac and cancer risk as well.  - lisinopril (PRINIVIL,ZESTRIL) 10 MG tablet; Take 1 tablet (10 mg total) by mouth daily.  Dispense: 30 tablet; Refill: 2  3. Acquired hypothyroidism  Chronic, controlled  Continue with current medications  Will check thyroid levels - TSH - T4 - T3, free        Arnette FeltsJanece Christyann Manolis, FNP

## 2019-05-11 LAB — CMP14 + ANION GAP
ALT: 29 IU/L (ref 0–32)
AST: 17 IU/L (ref 0–40)
Albumin/Globulin Ratio: 2 (ref 1.2–2.2)
Albumin: 4.4 g/dL (ref 3.7–4.7)
Alkaline Phosphatase: 72 IU/L (ref 39–117)
Anion Gap: 15 mmol/L (ref 10.0–18.0)
BUN/Creatinine Ratio: 15 (ref 12–28)
BUN: 17 mg/dL (ref 8–27)
Bilirubin Total: 0.4 mg/dL (ref 0.0–1.2)
CO2: 21 mmol/L (ref 20–29)
Calcium: 9.6 mg/dL (ref 8.7–10.3)
Chloride: 106 mmol/L (ref 96–106)
Creatinine, Ser: 1.16 mg/dL — ABNORMAL HIGH (ref 0.57–1.00)
GFR calc Af Amer: 55 mL/min/{1.73_m2} — ABNORMAL LOW (ref >59)
GFR calc non Af Amer: 47 mL/min/{1.73_m2} — ABNORMAL LOW (ref >59)
Globulin, Total: 2.2 g/dL (ref 1.5–4.5)
Glucose: 217 mg/dL — ABNORMAL HIGH (ref 65–99)
Potassium: 4.5 mmol/L (ref 3.5–5.2)
Sodium: 142 mmol/L (ref 134–144)
Total Protein: 6.6 g/dL (ref 6.0–8.5)

## 2019-05-11 LAB — LIPID PANEL
Chol/HDL Ratio: 3.3 ratio (ref 0.0–4.4)
Cholesterol, Total: 149 mg/dL (ref 100–199)
HDL: 45 mg/dL (ref >39)
LDL Calculated: 68 mg/dL (ref 0–99)
Triglycerides: 182 mg/dL — ABNORMAL HIGH (ref 0–149)
VLDL Cholesterol Cal: 36 mg/dL (ref 5–40)

## 2019-05-11 LAB — T4, FREE: Free T4: 1.5 ng/dL (ref 0.82–1.77)

## 2019-05-11 LAB — TSH: TSH: 1.43 u[IU]/mL (ref 0.450–4.500)

## 2019-05-11 LAB — HEMOGLOBIN A1C
Est. average glucose Bld gHb Est-mCnc: 143 mg/dL
Hgb A1c MFr Bld: 6.6 % — ABNORMAL HIGH (ref 4.8–5.6)

## 2019-05-11 LAB — T3: T3, Total: 71 ng/dL (ref 71–180)

## 2019-05-17 DIAGNOSIS — E1122 Type 2 diabetes mellitus with diabetic chronic kidney disease: Secondary | ICD-10-CM | POA: Diagnosis not present

## 2019-05-17 DIAGNOSIS — I129 Hypertensive chronic kidney disease with stage 1 through stage 4 chronic kidney disease, or unspecified chronic kidney disease: Secondary | ICD-10-CM | POA: Diagnosis not present

## 2019-05-17 DIAGNOSIS — N183 Chronic kidney disease, stage 3 (moderate): Secondary | ICD-10-CM | POA: Diagnosis not present

## 2019-05-17 DIAGNOSIS — E785 Hyperlipidemia, unspecified: Secondary | ICD-10-CM | POA: Diagnosis not present

## 2019-06-12 ENCOUNTER — Other Ambulatory Visit: Payer: Self-pay

## 2019-06-12 MED ORDER — LEVOTHYROXINE SODIUM 112 MCG PO TABS: 112.0000 ug | ORAL_TABLET | Freq: Every day | ORAL | 0 refills | Status: DC

## 2019-07-10 ENCOUNTER — Other Ambulatory Visit: Payer: Self-pay

## 2019-07-10 DIAGNOSIS — E1165 Type 2 diabetes mellitus with hyperglycemia: Secondary | ICD-10-CM

## 2019-07-10 MED ORDER — OZEMPIC (0.25 OR 0.5 MG/DOSE) 2 MG/1.5ML ~~LOC~~ SOPN: 0.5000 mL | PEN_INJECTOR | SUBCUTANEOUS | 2 refills | Status: DC

## 2019-07-11 ENCOUNTER — Ambulatory Visit: Payer: Self-pay

## 2019-07-11 ENCOUNTER — Other Ambulatory Visit: Payer: Self-pay

## 2019-07-11 DIAGNOSIS — I10 Essential (primary) hypertension: Secondary | ICD-10-CM

## 2019-07-11 DIAGNOSIS — E119 Type 2 diabetes mellitus without complications: Secondary | ICD-10-CM

## 2019-07-11 NOTE — Chronic Care Management (AMB) (Signed)
Care Management Note   Catherine [name] is a 73 y.o. year old female who is a primary care patient of [name], [place] . The CM team was consulted for assistance with medication costs related to [place].   Review of patient status, including review of consultants reports, rand collaboration with appropriate care team members and the patient's provider was performed as part of comprehensive patient evaluation and provision of care management services. Telephone outreach to patient today to introduce CM services.    Outpatient Encounter Medications as of 07/11/2019  Medication Sig Note  . Ascorbic Acid (VITAMIN C) 1000 MG tablet Take 1,000 mg by mouth daily.   [name] Kitchen aspirin 81 MG tablet Take 81 mg by mouth daily.   [name] Kitchen atorvastatin (LIPITOR) 10 MG tablet Take 10 mg by mouth daily.   . beta carotene w/minerals (OCUVITE) tablet Take 1 tablet by mouth daily. 08/17/2018: Uses preservision  . calcium citrate-vitamin D (CITRACAL+D) 315-200 MG-UNIT tablet Take 1 tablet by mouth 2 (two) times daily.   . Cyanocobalamin (VITAMIN B 12 PO) Take 1,000 mg by mouth daily.   . ferrous sulfate 325 (65 FE) MG tablet TAKE 1 TABLET BY MOUTH TWICE A DAY   . glimepiride (AMARYL) 4 MG tablet Take 1 tablet (4 mg total) by mouth daily with breakfast.   . Insulin Pen Needle (PEN NEEDLES) 32G X 4 MM MISC 1 each by Does not apply route once a week. Use as directed once per week with Ozempic   . levothyroxine (SYNTHROID) 112 MCG tablet Take 1 tablet (112 mcg total) by mouth daily before breakfast.   . lisinopril (ZESTRIL) 10 MG tablet TAKE 1 TABLET BY MOUTH EVERY DAY   . Multiple Vitamin (MULTIVITAMIN WITH MINERALS) TABS tablet Take 1 tablet by mouth daily.   . Omega-3 Fatty Acids (FISH OIL) 1000 MG CAPS Take by mouth.   [name] Kitchen PROCTOSOL HC 2.5 % rectal cream APPLY RECTALLY 4 TIMES A DAY FOR HEMORRHOIDS   . Semaglutide,0.25 or 0.5MG/DOS, (OZEMPIC, 0.25 OR 0.5 MG/DOSE,) 2 MG/1.5ML SOPN Inject 0.5 mLs into the muscle once a week.    . Wheat Dextrin (BENEFIBER DRINK MIX PO) Take by mouth.    No facility-administered encounter medications on file as of 07/11/2019.     I reached out to Catherine [name] by phone today.   Catherine [name] was given information about Chronic Care Management services today including:  1. CCM service includes personalized support from designated clinical staff supervised by her physician, including individualized plan of care and coordination with other care providers 2. 24/7 contact phone numbers for assistance for urgent and routine care needs. 3. Service will only be billed when office clinical staff spend 20 minutes or more in a month to coordinate care. 4. Only one practitioner may furnish and bill the service in a calendar month. 5. The patient may stop CCM services at any time (effective at the end of the month) by phone call to the office staff. 6. The patient will be responsible for cost sharing (co-pay) of up to 20% of the service fee (after annual deductible is met).   Patient did not agree to services and wishes to consider information provided before deciding about enrollment in care management services.  The patient stated "I don't want to consent until I am face to face with you at the doctors office". CCM SW explained that this member of the care management team is telephonic but the embedded PharmD is currently in the clinic one  day per week. Advised the patient to expect a call from embedded PharmD to arrange appointment at a later date.   Follow Up Plan: No follow up planned by CCM SW at this time. CCM SW has communicated current enrollment status with embedded PharmD who will assist with enrollment going forward.   Daneen Schick, BSW, CDP Social Worker, Certified Dementia Practitioner [facility] [phone]

## 2019-07-12 ENCOUNTER — Ambulatory Visit: Payer: Medicare Other | Admitting: Pharmacist

## 2019-07-12 ENCOUNTER — Ambulatory Visit (INDEPENDENT_AMBULATORY_CARE_PROVIDER_SITE_OTHER): Payer: Medicare Other | Admitting: Nurse Practitioner

## 2019-07-12 ENCOUNTER — Encounter: Payer: Self-pay | Admitting: Nurse Practitioner

## 2019-07-12 ENCOUNTER — Other Ambulatory Visit: Payer: Self-pay

## 2019-07-12 VITALS — BP 138/84 | HR 86 | Temp 98.4°F | Ht 64.8 in | Wt 177.4 lb

## 2019-07-12 DIAGNOSIS — L0202 Furuncle of face: Secondary | ICD-10-CM

## 2019-07-12 DIAGNOSIS — L0292 Furuncle, unspecified: Secondary | ICD-10-CM

## 2019-07-12 DIAGNOSIS — I10 Essential (primary) hypertension: Secondary | ICD-10-CM

## 2019-07-12 DIAGNOSIS — E119 Type 2 diabetes mellitus without complications: Secondary | ICD-10-CM

## 2019-07-12 MED ORDER — CEPHALEXIN 250 MG PO CAPS: 250.0000 mg | ORAL_CAPSULE | Freq: Four times a day (QID) | ORAL | 0 refills | Status: AC

## 2019-07-12 MED ORDER — MUPIROCIN 2 % EX OINT: 1.0000 "application " | TOPICAL_OINTMENT | Freq: Two times a day (BID) | CUTANEOUS | 0 refills | Status: DC

## 2019-07-12 NOTE — Progress Notes (Signed)
Subjective:     Patient ID: Catherine [name] , female    DOB: 15-Jun-1946 , 73 y.o.   MRN: [id number]   Chief Complaint  Patient presents with  . Recurrent Skin Infections    HPI  Rash This is a new problem. The current episode started 1 to 4 weeks ago (2 weeks ago). Location: left side of face  She was exposed to nothing. Pertinent negatives include no anorexia, fatigue, fever or shortness of breath. Past treatments include nothing. There is no history of allergies.     Past Medical History:  Diagnosis Date  . Diabetes mellitus without complication ([place])   . Hypertension   . Thyroid disease      Family History  Problem Relation Age of Onset  . Diabetes Mellitus II Mother   . Stroke Mother   . Diabetes Mellitus II Sister   . Hypertension Sister      Current Outpatient Medications:  .  Ascorbic Acid (VITAMIN C) 1000 MG tablet, Take 1,000 mg by mouth daily., Disp: , Rfl:  .  aspirin 81 MG tablet, Take 81 mg by mouth daily., Disp: , Rfl:  .  atorvastatin (LIPITOR) 10 MG tablet, Take 10 mg by mouth daily., Disp: , Rfl:  .  beta carotene w/minerals (OCUVITE) tablet, Take 1 tablet by mouth daily., Disp: , Rfl:  .  calcium citrate-vitamin D (CITRACAL+D) 315-200 MG-UNIT tablet, Take 1 tablet by mouth 2 (two) times daily., Disp: , Rfl:  .  Cyanocobalamin (VITAMIN B 12 PO), Take 1,000 mg by mouth daily., Disp: , Rfl:  .  ferrous sulfate 325 (65 FE) MG tablet, TAKE 1 TABLET BY MOUTH TWICE A DAY, Disp: 180 tablet, Rfl: 1 .  glimepiride (AMARYL) 4 MG tablet, Take 1 tablet (4 mg total) by mouth daily with breakfast., Disp: 30 tablet, Rfl: 0 .  Insulin Pen Needle (PEN NEEDLES) 32G X 4 MM MISC, 1 each by Does not apply route once a week. Use as directed once per week with Ozempic, Disp: 30 each, Rfl: 2 .  levothyroxine (SYNTHROID) 112 MCG tablet, Take 1 tablet (112 mcg total) by mouth daily before breakfast., Disp: 90 tablet, Rfl: 0 .  lisinopril (ZESTRIL) 10 MG tablet, TAKE 1 TABLET BY  MOUTH EVERY DAY, Disp: 90 tablet, Rfl: 0 .  Multiple Vitamin (MULTIVITAMIN WITH MINERALS) TABS tablet, Take 1 tablet by mouth daily., Disp: , Rfl:  .  Omega-3 Fatty Acids (FISH OIL) 1000 MG CAPS, Take by mouth., Disp: , Rfl:  .  PROCTOSOL HC 2.5 % rectal cream, APPLY RECTALLY 4 TIMES A DAY FOR HEMORRHOIDS, Disp: 56.7 g, Rfl: 3 .  Semaglutide,0.25 or 0.5MG /DOS, (OZEMPIC, 0.25 OR 0.5 MG/DOSE,) 2 MG/1.5ML SOPN, Inject 0.5 mLs into the muscle once a week., Disp: 3 pen, Rfl: 2 .  Wheat Dextrin (BENEFIBER DRINK MIX PO), Take by mouth., Disp: , Rfl:    No Known Allergies   Review of Systems  Constitutional: Negative for fatigue and fever.  Respiratory: Negative for shortness of breath.   Cardiovascular: Negative.  Negative for chest pain, palpitations and leg swelling.  Gastrointestinal: Negative for anorexia.  Skin: Negative for rash.       Left cheek boil, firm area with open area. Tender to touch  Neurological: Negative.      Today's Vitals   07/12/19 1127  BP: 138/84  Pulse: 86  Temp: 98.4 F (36.9 C)  TempSrc: Axillary  SpO2: 97%  Weight: 177 lb 6.4 oz (80.5 kg)  Height: 5'  4.8" (1.646 m)   Body mass index is 29.7 kg/m.   Objective:  Physical Exam Constitutional:      Appearance: Normal appearance.  Cardiovascular:     Rate and Rhythm: Normal rate and regular rhythm.     Pulses: Normal pulses.     Heart sounds: Normal heart sounds.  Skin:    General: Skin is warm and dry.     Capillary Refill: Capillary refill takes less than 2 seconds.     Findings: Lesion (left firm nodule with open area, tender touch appears to be a boil) present.  Neurological:     General: No focal deficit present.     Mental Status: She is alert and oriented to person, place, and time.         Assessment And Plan:     1. Boil  Firm hard boil present with open area concerning for staph infection will treat with mupirocin ointment and oral antibiotic  She is to follow up on Monday to see if  she needs a referral.   - mupirocin ointment (BACTROBAN) 2 %; Apply 1 application topically 2 (two) times daily.  Dispense: 22 g; Refill: 0 - cephALEXin (KEFLEX) 250 MG capsule; Take 1 capsule (250 mg total) by mouth 4 (four) times daily for 10 days.  Dispense: 40 capsule; Refill: 0    Arnette FeltsJanece Vela Render, FNP    THE PATIENT IS ENCOURAGED TO PRACTICE SOCIAL DISTANCING DUE TO THE COVID-19 PANDEMIC.

## 2019-07-12 NOTE — Patient Instructions (Signed)

## 2019-07-12 NOTE — Progress Notes (Signed)
Chronic Care Management   Initial Visit Note  07/17/2019 Name: Catherine [name] MRN: [id number] DOB: September 02, 1946  Referred by: [name], FNP Reason for referral : Chronic Care Management   Catherine [name] is a 73 y.o. year old female who is a primary care patient of [name], [place]. The CCM team was consulted for assistance with chronic disease management and care coordination needs.   Review of patient status, including review of consultants reports, relevant laboratory and other test results, and collaboration with appropriate care team members and the patient's provider was performed as part of comprehensive patient evaluation and provision of chronic care management services.    I met with Catherine [name] (and husband) in clinic today.  Permission was given by patient to speak in front  Medications: Outpatient Encounter Medications as of 07/12/2019  Medication Sig Note  . Ascorbic Acid (VITAMIN C) 1000 MG tablet Take 1,000 mg by mouth daily.   [name] Kitchen aspirin 81 MG tablet Take 81 mg by mouth daily.   [name] Kitchen atorvastatin (LIPITOR) 10 MG tablet Take 10 mg by mouth daily.   . beta carotene w/minerals (OCUVITE) tablet Take 1 tablet by mouth daily. 08/17/2018: Uses preservision  . calcium citrate-vitamin D (CITRACAL+D) 315-200 MG-UNIT tablet Take 1 tablet by mouth 2 (two) times daily.   . Cyanocobalamin (VITAMIN B 12 PO) Take 1,000 mg by mouth daily.   . ferrous sulfate 325 (65 FE) MG tablet TAKE 1 TABLET BY MOUTH TWICE A DAY   . glimepiride (AMARYL) 4 MG tablet Take 1 tablet (4 mg total) by mouth daily with breakfast.   . Insulin Pen Needle (PEN NEEDLES) 32G X 4 MM MISC 1 each by Does not apply route once a week. Use as directed once per week with Ozempic   . levothyroxine (SYNTHROID) 112 MCG tablet Take 1 tablet (112 mcg total) by mouth daily before breakfast.   . lisinopril (ZESTRIL) 10 MG tablet TAKE 1 TABLET BY MOUTH EVERY DAY 07/12/2019: Takes 0.5 tablets daily   . Multiple Vitamin  (MULTIVITAMIN WITH MINERALS) TABS tablet Take 1 tablet by mouth daily.   . Omega-3 Fatty Acids (FISH OIL) 1000 MG CAPS Take by mouth.   [name] Kitchen PROCTOSOL HC 2.5 % rectal cream APPLY RECTALLY 4 TIMES A DAY FOR HEMORRHOIDS   . Semaglutide,0.25 or 0.5MG/DOS, (OZEMPIC, 0.25 OR 0.5 MG/DOSE,) 2 MG/1.5ML SOPN Inject 0.5 mLs into the muscle once a week.   . Wheat Dextrin (BENEFIBER DRINK MIX PO) Take by mouth.    No facility-administered encounter medications on file as of 07/12/2019.      Objective:   Goals Addressed            This Visit's Progress     Patient Stated   . I would like to control my diabetes and optimize my medication management of my chronic conditions (pt-stated)       Current Barriers:  . Diabetes: T2DM; most recent A1c 6.6% on 05/10/19 (was 8.3% on 02/06/19)--improved diet per patient report  . Current antihyperglycemic regimen: Ozempic 0.58m, glimepiride 486m(work to optimize/potentially d/c glimepiride depending on patient duration of therapy on a sulfonylurea) . denies hypoglycemic symptoms; denies hyperglycemic symptoms . Current meal patterns: o Patient adhering to limiting carbohydrate intake o Decreased sugary drink intake o Increased water intake . Current exercise: walking . Current blood glucose readings: FBG: 89, 90, always <100 per patient report . Cardiovascular risk reduction: o Current hypertensive regimen: lisinopril 105maily o Current hyperlipidemia regimen: atorvastatin 46m54mily  Pharmacist Clinical Goal(s):  [name] Kitchen Over the next 90 days, patient with work with PharmD and primary care provider to address needs related to   Interventions: . Comprehensive medication review performed, medication list updated in electronic medical record . Comprehensive medication review performed.  Reviewed medication fill history via insurance claims data confirming patient appears compliant with having his medications filled on time as prescribed by provider. . Reviewed &  discussed the following diabetes-related information with patient: o Continue checking blood sugars as directed o Follow ADA recommended "diabetes-friendly" diet  (reviewed healthy snack/food options) o Discussed GLP-1 injection technique; Patient uses AccuCheck Guide glucometer o Reviewed medication purpose/side effects-->patient denies adverse events o Continue taking all medications as prescribed by provider .   Patient Self Care Activities:  . Patient will check blood glucose daily , document, and provide at future appointments . Patient will focus on medication adherence by taking medications as prescribed . Patient will take medications as prescribed . Patient will contact provider with any episodes of hypoglycemia . Patient will report any questions or concerns to provider   Initial goal documentation         Catherine [name] was given information about Chronic Care Management services today including:  1. CCM service includes personalized support from designated clinical staff supervised by her physician, including individualized plan of care and coordination with other care providers 2. 24/7 contact phone numbers for assistance for urgent and routine care needs. 3. Service will only be billed when office clinical staff spend 20 minutes or more in a month to coordinate care. 4. Only one practitioner may furnish and bill the service in a calendar month. 5. The patient may stop CCM services at any time (effective at the end of the month) by phone call to the office staff. 6. The patient will be responsible for cost sharing (co-pay) of up to 20% of the service fee (after annual deductible is met).  Patient agreed to services and verbal consent obtained.   Plan:   The care management team will reach out to the patient again over the next 2 weeks.  Regina Eck, PharmD, BCPS Clinical Pharmacist, [facility] [place]: [phone]

## 2019-07-13 ENCOUNTER — Other Ambulatory Visit: Payer: Self-pay | Admitting: Pharmacy Technician

## 2019-07-13 NOTE — Patient Outreach (Signed)
[facility]) Care Management  07/13/2019  Catherine [name] 1945/12/11 [id number]    Medication Assistance Referral  Referral From: [place]. (Clinic RPh)  Medication/Company: [employer] / [employer] Patient application portion:  N/A RPh Jenne Pane Provider application portion:  N/A RPh Jenne Pane  Provider address/fax verified via:  N/A Sharyl Nimrod  Faxed completed application and required documents into [employer]  Follow up:  Will follow up with company in 3-5 business days to check application status  [name]. [name] Osakis Certified Pharmacy Technician [facility] Direct [phone]

## 2019-07-14 ENCOUNTER — Ambulatory Visit (INDEPENDENT_AMBULATORY_CARE_PROVIDER_SITE_OTHER): Payer: Medicare Other | Admitting: Pharmacist

## 2019-07-14 DIAGNOSIS — I10 Essential (primary) hypertension: Secondary | ICD-10-CM

## 2019-07-14 DIAGNOSIS — E119 Type 2 diabetes mellitus without complications: Secondary | ICD-10-CM | POA: Diagnosis not present

## 2019-07-17 ENCOUNTER — Other Ambulatory Visit: Payer: Self-pay

## 2019-07-17 ENCOUNTER — Ambulatory Visit (INDEPENDENT_AMBULATORY_CARE_PROVIDER_SITE_OTHER): Payer: Medicare Other | Admitting: Nurse Practitioner

## 2019-07-17 ENCOUNTER — Encounter: Payer: Self-pay | Admitting: Nurse Practitioner

## 2019-07-17 VITALS — BP 138/78 | HR 77 | Temp 98.4°F | Ht 66.8 in | Wt 178.2 lb

## 2019-07-17 DIAGNOSIS — E119 Type 2 diabetes mellitus without complications: Secondary | ICD-10-CM

## 2019-07-17 DIAGNOSIS — L0201 Cutaneous abscess of face: Secondary | ICD-10-CM

## 2019-07-17 NOTE — Patient Instructions (Signed)
Visit Information  Goals Addressed            This Visit's Progress     Patient Stated   . I would like to control my diabetes and optimize my medication management of my chronic conditions (pt-stated)       Current Barriers:  . Diabetes: T2DM; most recent A1c 6.6% on 05/10/19 (was 8.3% on 02/06/19)--improved diet per patient report  . Current antihyperglycemic regimen: Ozempic 0.5mg , glimepiride 4mg  (work to optimize/potentially d/c glimepiride depending on patient duration of therapy on a sulfonylurea) . denies hypoglycemic symptoms; denies hyperglycemic symptoms . Current meal patterns: o Patient adhering to limiting carbohydrate intake o Decreased sugary drink intake o Increased water intake . Current exercise: walking . Current blood glucose readings: FBG: 89, 90, always <100 per patient report . Cardiovascular risk reduction: o Current hypertensive regimen: lisinopril 10mg  daily o Current hyperlipidemia regimen: atorvastatin 10mg  daily  Pharmacist Clinical Goal(s):  [name] Kitchen Over the next 90 days, patient with work with PharmD and primary care provider to address needs related to   Interventions: . Comprehensive medication review performed, medication list updated in electronic medical record . Comprehensive medication review performed.  Reviewed medication fill history via insurance claims data confirming patient appears compliant with having his medications filled on time as prescribed by provider. . Reviewed & discussed the following diabetes-related information with patient: o Continue checking blood sugars as directed o Follow ADA recommended "diabetes-friendly" diet  (reviewed healthy snack/food options) o Discussed GLP-1 injection technique; Patient uses AccuCheck Guide glucometer o Reviewed medication purpose/side effects-->patient denies adverse events o Continue taking all medications as prescribed by provider .   Patient Self Care Activities:  . Patient will check  blood glucose daily , document, and provide at future appointments . Patient will focus on medication adherence by taking medications as prescribed . Patient will take medications as prescribed . Patient will contact provider with any episodes of hypoglycemia . Patient will report any questions or concerns to provider   Initial goal documentation        The patient verbalized understanding of instructions provided today and declined a print copy of patient instruction materials.   The care management team will reach out to the patient again over the next 4 weeks.  Regina Eck, PharmD, BCPS Clinical Pharmacist, [facility] [place]: [phone]

## 2019-07-17 NOTE — Progress Notes (Signed)
Subjective:     Patient ID: Catherine [name] , female    DOB: 1946/09/18 , 73 y.o.   MRN: [id number]   Chief Complaint  Patient presents with  . Recurrent Skin Infections    HPI  Catherine [name] 73 year old female with a history of skin infections and diabetes is here for recheck of the abscess on her left check. She declines the pain is still present.  She is on day number 4 of her antibiotics.      Past Medical History:  Diagnosis Date  . Diabetes mellitus without complication (HCC)   . Hypertension   . Thyroid disease      Family History  Problem Relation Age of Onset  . Diabetes Mellitus II Mother   . Stroke Mother   . Diabetes Mellitus II Sister   . Hypertension Sister      Current Outpatient Medications:  .  Ascorbic Acid (VITAMIN C) 1000 MG tablet, Take 1,000 mg by mouth daily., Disp: , Rfl:  .  aspirin 81 MG tablet, Take 81 mg by mouth daily., Disp: , Rfl:  .  atorvastatin (LIPITOR) 10 MG tablet, Take 10 mg by mouth daily., Disp: , Rfl:  .  beta carotene w/minerals (OCUVITE) tablet, Take 1 tablet by mouth daily., Disp: , Rfl:  .  calcium citrate-vitamin D (CITRACAL+D) 315-200 MG-UNIT tablet, Take 1 tablet by mouth 2 (two) times daily., Disp: , Rfl:  .  cephALEXin (KEFLEX) 250 MG capsule, Take 1 capsule (250 mg total) by mouth 4 (four) times daily for 10 days., Disp: 40 capsule, Rfl: 0 .  Cyanocobalamin (VITAMIN B 12 PO), Take 1,000 mg by mouth daily., Disp: , Rfl:  .  ferrous sulfate 325 (65 FE) MG tablet, TAKE 1 TABLET BY MOUTH TWICE A DAY, Disp: 180 tablet, Rfl: 1 .  glimepiride (AMARYL) 4 MG tablet, Take 1 tablet (4 mg total) by mouth daily with breakfast., Disp: 30 tablet, Rfl: 0 .  Insulin Pen Needle (PEN NEEDLES) 32G X 4 MM MISC, 1 each by Does not apply route once a week. Use as directed once per week with Ozempic, Disp: 30 each, Rfl: 2 .  levothyroxine (SYNTHROID) 112 MCG tablet, Take 1 tablet (112 mcg total) by mouth daily before breakfast., Disp: 90 tablet,  Rfl: 0 .  lisinopril (ZESTRIL) 10 MG tablet, TAKE 1 TABLET BY MOUTH EVERY DAY, Disp: 90 tablet, Rfl: 0 .  Multiple Vitamin (MULTIVITAMIN WITH MINERALS) TABS tablet, Take 1 tablet by mouth daily., Disp: , Rfl:  .  mupirocin ointment (BACTROBAN) 2 %, Apply 1 application topically 2 (two) times daily., Disp: 22 g, Rfl: 0 .  Omega-3 Fatty Acids (FISH OIL) 1000 MG CAPS, Take by mouth., Disp: , Rfl:  .  PROCTOSOL HC 2.5 % rectal cream, APPLY RECTALLY 4 TIMES A DAY FOR HEMORRHOIDS, Disp: 56.7 g, Rfl: 3 .  Semaglutide,0.25 or 0.5MG /DOS, (OZEMPIC, 0.25 OR 0.5 MG/DOSE,) 2 MG/1.5ML SOPN, Inject 0.5 mLs into the muscle once a week., Disp: 3 pen, Rfl: 2 .  Wheat Dextrin (BENEFIBER DRINK MIX PO), Take by mouth., Disp: , Rfl:    No Known Allergies   Review of Systems  Constitutional: Negative.   Respiratory: Negative.   Cardiovascular: Negative.   Skin: Negative for rash.       Left cheek with abcess present, drying up  Neurological: Negative for dizziness.  Psychiatric/Behavioral: Negative.      Today's Vitals   07/17/19 1151  BP: 138/78  Pulse: 77  Temp:  98.4 F (36.9 C)  TempSrc: Oral  SpO2: 97%  Weight: 178 lb 3.2 oz (80.8 kg)  Height: 5' 6.8" (1.697 m)   Body mass index is 28.08 kg/m.   Objective:  Physical Exam Vitals signs reviewed.  Constitutional:      Appearance: Normal appearance.  Cardiovascular:     Rate and Rhythm: Normal rate and regular rhythm.     Pulses: Normal pulses.     Heart sounds: Normal heart sounds. No murmur.  Pulmonary:     Effort: Pulmonary effort is normal. No respiratory distress.     Breath sounds: Normal breath sounds.  Skin:    General: Skin is warm.     Capillary Refill: Capillary refill takes less than 2 seconds.     Comments: Left side of cheek abcess is getting smaller, still slightly firm Drying and closing center  Neurological:     General: No focal deficit present.     Mental Status: She is alert and oriented to person, place, and time.          Assessment And Plan:     1. Abscess of face  The area is looking better not as red and drying up.    Continue antibiotics, will recheck the area at her visit in October sooner if worse.  2. Type 2 diabetes mellitus without complication, without long-term current use of insulin ([place])  Her insurance wants $400 for her Ozempic and does not   Will try her on Trulicity when she returns in October with education  She is not interested in mail order due to the current situation with mail delivery.   She has enough Ozempic to last for 2 months.     [name], FNP    THE PATIENT IS ENCOURAGED TO PRACTICE SOCIAL DISTANCING DUE TO THE COVID-19 PANDEMIC.

## 2019-07-18 ENCOUNTER — Ambulatory Visit: Payer: Self-pay

## 2019-07-18 DIAGNOSIS — I10 Essential (primary) hypertension: Secondary | ICD-10-CM

## 2019-07-18 DIAGNOSIS — E119 Type 2 diabetes mellitus without complications: Secondary | ICD-10-CM

## 2019-07-18 NOTE — Chronic Care Management (AMB) (Signed)
Chronic Care Management    Social Work General Note  07/18/2019 Name: Catherine SpeckingBarbara P [name] MRN: [id number] DOB: 1946/09/30  Catherine SpeckingBarbara P [name] is a 73 y.o. year old female who is a primary care patient of Catherine FeltsMoore, [name], FNP. The CCM was consulted to assist the patient with [employer] .   Review of patient status, including review of consultants reports, relevant laboratory and other test results, and collaboration with appropriate care team members and the patient's provider was performed as part of comprehensive patient evaluation and provision of chronic care management services.    I received an inbound call from the patient stating she was encouraged to contacted CCM SW by a staff member at her primary providers office.  Outpatient Encounter Medications as of 07/18/2019  Medication Sig Note  . Ascorbic Acid (VITAMIN C) 1000 MG tablet Take 1,000 mg by mouth daily.   [name] Kitchen. aspirin 81 MG tablet Take 81 mg by mouth daily.   [name] Kitchen. atorvastatin (LIPITOR) 10 MG tablet Take 10 mg by mouth daily.   . beta carotene w/minerals (OCUVITE) tablet Take 1 tablet by mouth daily. 08/17/2018: Uses preservision  . calcium citrate-vitamin D (CITRACAL+D) 315-200 MG-UNIT tablet Take 1 tablet by mouth 2 (two) times daily.   . cephALEXin (KEFLEX) 250 MG capsule Take 1 capsule (250 mg total) by mouth 4 (four) times daily for 10 days.   . Cyanocobalamin (VITAMIN B 12 PO) Take 1,000 mg by mouth daily.   . ferrous sulfate 325 (65 FE) MG tablet TAKE 1 TABLET BY MOUTH TWICE A DAY   . glimepiride (AMARYL) 4 MG tablet Take 1 tablet (4 mg total) by mouth daily with breakfast.   . Insulin Pen Needle (PEN NEEDLES) 32G X 4 MM MISC 1 each by Does not apply route once a week. Use as directed once per week with Ozempic   . levothyroxine (SYNTHROID) 112 MCG tablet Take 1 tablet (112 mcg total) by mouth daily before breakfast.   . lisinopril (ZESTRIL) 10 MG tablet TAKE 1 TABLET BY MOUTH EVERY DAY 07/12/2019: Takes 0.5 tablets daily   .  Multiple Vitamin (MULTIVITAMIN WITH MINERALS) TABS tablet Take 1 tablet by mouth daily.   . mupirocin ointment (BACTROBAN) 2 % Apply 1 application topically 2 (two) times daily.   . Omega-3 Fatty Acids (FISH OIL) 1000 MG CAPS Take by mouth.   [name] Kitchen. PROCTOSOL HC 2.5 % rectal cream APPLY RECTALLY 4 TIMES A DAY FOR HEMORRHOIDS   . Semaglutide,0.25 or 0.5MG /DOS, (OZEMPIC, 0.25 OR 0.5 MG/DOSE,) 2 MG/1.5ML SOPN Inject 0.5 mLs into the muscle once a week.   . Wheat Dextrin (BENEFIBER DRINK MIX PO) Take by mouth.    No facility-administered encounter medications on file as of 07/18/2019.     Goals Addressed            This Visit's Progress     Patient Stated   . "Tiana told me to call you to see how you could help me" (pt-stated)       Current Barriers:  [name] Kitchen. Knowledge Barriers related to resources and support available to address needs related to Chronic Care Management and challenges surrounding Social Determinants of Health  Clinical Social Work Clinical Goal(s):   Over the next 20 days, the patient will understand the role of the CCM team and work with SW to complete SDOH (Social Determinants of Health) screen.  CCM SW Interventions:  Inbound call received from the patient requesting assistance with medication costs  Patient education provided regarding role  of SW   Performed chart review to determine the patient is currently enrolled in Chronic Care Management program and is actively working with embedded PharmD  Advised the patient SW would collaborate with embeded PharmD to determine how SW could assist with patient needs related to medication costs  Collaboration with PharmD Lottie Dawson who reports she is working with the patient to complete an LIS application. [name] further reports the patient was encouraged to contact CCM SW to complete an SDOH screen  Scheduled follow up call to the patient over the next week to complete SW screen  Patient Self Care Activities:   Patient  currently unable to independently manage chronic conditions   Initial goal documentation        Follow Up Plan: SW will follow up with patient by phone over the next week to complete SDOH screen.       Daneen Schick, BSW, CDP Social Worker, Certified Dementia Practitioner [place] [facility] [place] Management [phone]  Total time spent performing care coordination and/or care management activities with the patient by phone or face to face = 7 minutes.

## 2019-07-18 NOTE — Patient Instructions (Signed)
Social Worker Visit Information  Goals we discussed today:  Goals Addressed            This Visit's Progress     Patient Stated   . "Tiana told me to call you to see how you could help me" (pt-stated)       Current Barriers:  [name] Kitchen Knowledge Barriers related to resources and support available to address needs related to Chronic Care Management and challenges surrounding Social Determinants of Health  Clinical Social Work Clinical Goal(s):   Over the next 20 days, the patient will understand the role of the CCM team and work with SW to complete SDOH (Social Determinants of Health) screen.  CCM SW Interventions:  Inbound call received from the patient requesting assistance with medication costs  Patient education provided regarding role of SW   Performed chart review to determine the patient is currently enrolled in Chronic Care Management program and is actively working with embedded PharmD  Advised the patient SW would collaborate with embeded PharmD to determine how SW could assist with patient needs related to medication costs  Collaboration with PharmD Lottie Dawson who reports she is working with the patient to complete an LIS application. [name] further reports the patient was encouraged to contact CCM SW to complete an SDOH screen  Scheduled follow up call to the patient over the next week to complete SW screen  Patient Self Care Activities:   Patient currently unable to independently manage chronic conditions   Initial goal documentation        Follow Up Plan: SW will follow up with patient by phone over the next week to conduct SDOH screening call   Daneen Schick, BSW, CDP Social Worker, Certified Dementia Practitioner [place] / [place] Management [phone]

## 2019-07-19 ENCOUNTER — Ambulatory Visit (INDEPENDENT_AMBULATORY_CARE_PROVIDER_SITE_OTHER): Payer: Medicare Other | Admitting: Pharmacist

## 2019-07-19 ENCOUNTER — Ambulatory Visit: Payer: Medicare Other

## 2019-07-19 DIAGNOSIS — E119 Type 2 diabetes mellitus without complications: Secondary | ICD-10-CM

## 2019-07-19 DIAGNOSIS — I10 Essential (primary) hypertension: Secondary | ICD-10-CM

## 2019-07-19 NOTE — Chronic Care Management (AMB) (Signed)
Chronic Care Management   Social Work Follow Up Note  07/19/2019 Name: Catherine [name] MRN: [id number] DOB: [birth year]  Catherine [name] is a 73 y.o. year old female who is a primary care patient of [name], [place]. The CCM team was consulted for assistance with [employer] .   Review of patient status, including review of consultants reports, other relevant assessments, and collaboration with appropriate care team members and the patient's provider was performed as part of comprehensive patient evaluation and provision of chronic care management services.    SDOH (Social Determinants of Health) screening performed today: None. See Care Plan for related entries.   Outpatient Encounter Medications as of 07/19/2019  Medication Sig Note  . Ascorbic Acid (VITAMIN C) 1000 MG tablet Take 1,000 mg by mouth daily.   [name] Kitchen aspirin 81 MG tablet Take 81 mg by mouth daily.   [name] Kitchen atorvastatin (LIPITOR) 10 MG tablet Take 10 mg by mouth daily.   . beta carotene w/minerals (OCUVITE) tablet Take 1 tablet by mouth daily. 08/17/2018: Uses preservision  . calcium citrate-vitamin D (CITRACAL+D) 315-200 MG-UNIT tablet Take 1 tablet by mouth 2 (two) times daily.   . cephALEXin (KEFLEX) 250 MG capsule Take 1 capsule (250 mg total) by mouth 4 (four) times daily for 10 days.   . Cyanocobalamin (VITAMIN B 12 PO) Take 1,000 mg by mouth daily.   . ferrous sulfate 325 (65 FE) MG tablet TAKE 1 TABLET BY MOUTH TWICE A DAY   . glimepiride (AMARYL) 4 MG tablet Take 1 tablet (4 mg total) by mouth daily with breakfast.   . Insulin Pen Needle (PEN NEEDLES) 32G X 4 MM MISC 1 each by Does not apply route once a week. Use as directed once per week with Ozempic   . levothyroxine (SYNTHROID) 112 MCG tablet Take 1 tablet (112 mcg total) by mouth daily before breakfast.   . lisinopril (ZESTRIL) 10 MG tablet TAKE 1 TABLET BY MOUTH EVERY DAY 07/12/2019: Takes 0.5 tablets daily   . Multiple Vitamin (MULTIVITAMIN WITH MINERALS) TABS  tablet Take 1 tablet by mouth daily.   . mupirocin ointment (BACTROBAN) 2 % Apply 1 application topically 2 (two) times daily.   . Omega-3 Fatty Acids (FISH OIL) 1000 MG CAPS Take by mouth.   [name] Kitchen PROCTOSOL HC 2.5 % rectal cream APPLY RECTALLY 4 TIMES A DAY FOR HEMORRHOIDS   . Semaglutide,0.25 or 0.5MG /DOS, (OZEMPIC, 0.25 OR 0.5 MG/DOSE,) 2 MG/1.5ML SOPN Inject 0.5 mLs into the muscle once a week.   . Wheat Dextrin (BENEFIBER DRINK MIX PO) Take by mouth.    No facility-administered encounter medications on file as of 07/19/2019.      Goals Addressed            This Visit's Progress     Patient Stated   . COMPLETED: "Catherine [name] told me to call you to see how you could help me" (pt-stated)       Current Barriers:  [name] Kitchen Knowledge Barriers related to resources and support available to address needs related to Chronic Care Management and challenges surrounding Social Determinants of Health  Clinical Social Work Clinical Goal(s):   Over the next 20 days, the patient will understand the role of the CCM team and work with SW to complete SDOH (Social Determinants of Health) screen. Completed  CCM SW Interventions:  Outbound call placed to the patient to complete SDOH screen  Determined the patient does not have any current challenges aside from [place] the  patient embedded PharmD is currently assisting with challenges related to medication costs  Encouraged the patient to call SW if future resource needs are identified  Patient Self Care Activities:   Independently performs ADL and iADL's          Follow Up Plan: No SW follow up planned at this time. The patient will remain active with embedded PharmD. CCM SW has encouraged the patient to contact SW directly for future community resource needs.  Bevelyn NgoKendra Kutler Vanvranken, BSW, CDP Social Worker, Certified Dementia Practitioner [facility] [phone]  Total time spent performing care coordination and/or care  management activities with the patient by phone or face to face = 5 minutes.

## 2019-07-19 NOTE — Patient Instructions (Signed)
Social Worker Visit Information  Goals we discussed today:  Goals Addressed            This Visit's Progress     Patient Stated   . COMPLETED: "Tiana told me to call you to see how you could help me" (pt-stated)       Current Barriers:  [name] Kitchen Knowledge Barriers related to resources and support available to address needs related to Chronic Care Management and challenges surrounding Social Determinants of Health  Clinical Social Work Clinical Goal(s):   Over the next 20 days, the patient will understand the role of the CCM team and work with SW to complete SDOH (Social Determinants of Health) screen. Completed  CCM SW Interventions:  Outbound call placed to the patient to complete SDOH screen  Determined the patient does not have any current challenges aside from [place] the patient embedded PharmD is currently assisting with challenges related to medication costs  Encouraged the patient to call SW if future resource needs are identified  Patient Self Care Activities:   Independently performs ADL and iADL's         Follow Up Plan: No SW follow up planned at this time. Please call me for future resource needs.  Daneen Schick, BSW, CDP Social Worker, Certified Dementia Practitioner [place] [facility] [place] [facility] [phone]

## 2019-07-25 ENCOUNTER — Other Ambulatory Visit: Payer: Self-pay | Admitting: Internal Medicine

## 2019-07-25 DIAGNOSIS — I10 Essential (primary) hypertension: Secondary | ICD-10-CM

## 2019-07-25 NOTE — Patient Instructions (Signed)
Visit Information  Goals Addressed            This Visit's Progress     Patient Stated   . I need help with the cost of my medications (pt-stated)       Current Barriers:  . Financial Barriers: patient has [employer] and reports copay for Ozempic is cost prohibitive at this time   Pharmacist Clinical Goal(s):  [name] Kitchen Over the next 30 days, patient will work with PharmD and providers to relieve medication access concerns  Interventions: . Comprehensive medication review completed; medication list updated in electronic medical record.  [name] Kitchen CCM PharmD assisted patient in applying for Ozempic through [place], however patient was denied based on financial reasons.  Information given to patient on applying for LIS/extra help through [email or web address].  Also, patient was given the option to switch to [employer] product (Trulicity) as their application process is not as stringent.  [name] is agreeable to start Trulicity in October.  Will start paperwork.  Patient Self Care Activities:  . Patient will provide necessary portions of application   Initial goal documentation        The patient verbalized understanding of instructions provided today and declined a print copy of patient instruction materials.   The care management team will reach out to the patient again over the next 4 weeks.  Regina Eck, PharmD, BCPS Clinical Pharmacist, [facility] [place]: [phone]

## 2019-07-25 NOTE — Progress Notes (Signed)
Chronic Care Management   Initial Visit Note  07/14/2019 Name: Catherine [name] MRN: [id number] DOB: 19-Jul-1946  Referred by: [name], FNP Reason for referral : Chronic Care Management   Catherine [name] is a 73 y.o. year old female who is a primary care patient of [name], [place]. The CCM team was consulted for assistance with chronic disease management and care coordination needs.   Review of patient status, including review of consultants reports, relevant laboratory and other test results, and collaboration with appropriate care team members and the patient's provider was performed as part of comprehensive patient evaluation and provision of chronic care management services.    I met with Catherine [name] in clinic today.  Her husband was also present for visit and patient gave verbal permission for him to participate in appointment.  Medications: Outpatient Encounter Medications as of 07/14/2019  Medication Sig Note  . Ascorbic Acid (VITAMIN C) 1000 MG tablet Take 1,000 mg by mouth daily.   [name] Kitchen aspirin 81 MG tablet Take 81 mg by mouth daily.   [name] Kitchen atorvastatin (LIPITOR) 10 MG tablet Take 10 mg by mouth daily.   . beta carotene w/minerals (OCUVITE) tablet Take 1 tablet by mouth daily. 08/17/2018: Uses preservision  . calcium citrate-vitamin D (CITRACAL+D) 315-200 MG-UNIT tablet Take 1 tablet by mouth 2 (two) times daily.   . [EXPIRED] cephALEXin (KEFLEX) 250 MG capsule Take 1 capsule (250 mg total) by mouth 4 (four) times daily for 10 days.   . Cyanocobalamin (VITAMIN B 12 PO) Take 1,000 mg by mouth daily.   . ferrous sulfate 325 (65 FE) MG tablet TAKE 1 TABLET BY MOUTH TWICE A DAY   . glimepiride (AMARYL) 4 MG tablet Take 1 tablet (4 mg total) by mouth daily with breakfast.   . Insulin Pen Needle (PEN NEEDLES) 32G X 4 MM MISC 1 each by Does not apply route once a week. Use as directed once per week with Ozempic   . levothyroxine (SYNTHROID) 112 MCG tablet Take 1 tablet (112 mcg  total) by mouth daily before breakfast.   . Multiple Vitamin (MULTIVITAMIN WITH MINERALS) TABS tablet Take 1 tablet by mouth daily.   . mupirocin ointment (BACTROBAN) 2 % Apply 1 application topically 2 (two) times daily.   . Omega-3 Fatty Acids (FISH OIL) 1000 MG CAPS Take by mouth.   [name] Kitchen PROCTOSOL HC 2.5 % rectal cream APPLY RECTALLY 4 TIMES A DAY FOR HEMORRHOIDS   . Semaglutide,0.25 or 0.5MG/DOS, (OZEMPIC, 0.25 OR 0.5 MG/DOSE,) 2 MG/1.5ML SOPN Inject 0.5 mLs into the muscle once a week.   . Wheat Dextrin (BENEFIBER DRINK MIX PO) Take by mouth.   . [DISCONTINUED] lisinopril (ZESTRIL) 10 MG tablet TAKE 1 TABLET BY MOUTH EVERY DAY 07/12/2019: Takes 0.5 tablets daily    No facility-administered encounter medications on file as of 07/14/2019.      Objective:   Goals Addressed            This Visit's Progress     Patient Stated   . I would like to control my diabetes and optimize my medication management of my chronic conditions (pt-stated)       Current Barriers:  . Diabetes: T2DM; most recent A1c 6.6% on 05/10/19 (was 8.3% on 02/06/19)--improved diet per patient report  . Current antihyperglycemic regimen: Ozempic 0.39m, glimepiride 430m(work to optimize/potentially d/c glimepiride depending on patient duration of therapy on a sulfonylurea) . denies hypoglycemic symptoms; denies hyperglycemic symptoms . Current meal patterns: o Patient adhering  to limiting carbohydrate intake o Decreased sugary drink intake o Increased water intake . Current exercise: walking . Current blood glucose readings: FBG: 89, 90, always <100 per patient report . Cardiovascular risk reduction: o Current hypertensive regimen: lisinopril 63m daily o Current hyperlipidemia regimen: atorvastatin 151mdaily  Pharmacist Clinical Goal(s):  . [name] Kitchenver the next 90 days, patient with work with PharmD and primary care provider to address needs related to optimized medication management of chronic conditions.   Interventions: . Comprehensive medication review performed, medication list updated in electronic medical record . Comprehensive medication review performed.  Reviewed medication fill history via insurance claims data confirming patient appears compliant with having his medications filled on time as prescribed by provider. . Reviewed & discussed the following diabetes-related information with patient: o Continue checking blood sugars as directed o Follow ADA recommended "diabetes-friendly" diet  (reviewed healthy snack/food options) o Discussed GLP-1 injection technique; Patient uses AccuCheck Guide glucometer o Reviewed medication purpose/side effects-->patient denies adverse events o Continue taking all medications as prescribed by provider   Patient Self Care Activities:  . Patient will check blood glucose daily , document, and provide at future appointments . Patient will focus on medication adherence by taking medications as prescribed . Patient will take medications as prescribed . Patient will contact provider with any episodes of hypoglycemia . Patient will report any questions or concerns to provider   Initial goal documentation         Ms. MaSpainhoweras given information about Chronic Care Management services today including:  1. CCM service includes personalized support from designated clinical staff supervised by her physician, including individualized plan of care and coordination with other care providers 2. 24/7 contact phone numbers for assistance for urgent and routine care needs. 3. Service will only be billed when office clinical staff spend 20 minutes or more in a month to coordinate care. 4. Only one practitioner may furnish and bill the service in a calendar month. 5. The patient may stop CCM services at any time (effective at the end of the month) by phone call to the office staff. 6. The patient will be responsible for cost sharing (co-pay) of up to 20% of the  service fee (after annual deductible is met).  Patient agreed to services and verbal consent obtained.   Plan:   The care management team will reach out to the patient again over the next 7 days.   JuRegina EckPharmD, BCPS Clinical Pharmacist, [facility] [phone]

## 2019-07-25 NOTE — Progress Notes (Signed)
Chronic Care Management   Visit Note  07/18/2019 Name: Catherine [name] MRN: [id number] DOB: 1946-04-05  Referred by: [name], FNP Reason for referral : Chronic Care Management   Catherine [name] is a 73 y.o. year old female who is a primary care patient of [name], [place]. The CCM team was consulted for assistance with chronic disease management and care coordination needs.   Review of patient status, including review of consultants reports, relevant laboratory and other test results, and collaboration with appropriate care team members and the patient's provider was performed as part of comprehensive patient evaluation and provision of chronic care management services.    I spoke with Catherine [name] by telephone today.  Medications: Outpatient Encounter Medications as of 07/19/2019  Medication Sig Note  . Ascorbic Acid (VITAMIN C) 1000 MG tablet Take 1,000 mg by mouth daily.   [name] Kitchen aspirin 81 MG tablet Take 81 mg by mouth daily.   [name] Kitchen atorvastatin (LIPITOR) 10 MG tablet Take 10 mg by mouth daily.   . beta carotene w/minerals (OCUVITE) tablet Take 1 tablet by mouth daily. 08/17/2018: Uses preservision  . calcium citrate-vitamin D (CITRACAL+D) 315-200 MG-UNIT tablet Take 1 tablet by mouth 2 (two) times daily.   . [EXPIRED] cephALEXin (KEFLEX) 250 MG capsule Take 1 capsule (250 mg total) by mouth 4 (four) times daily for 10 days.   . Cyanocobalamin (VITAMIN B 12 PO) Take 1,000 mg by mouth daily.   . ferrous sulfate 325 (65 FE) MG tablet TAKE 1 TABLET BY MOUTH TWICE A DAY   . glimepiride (AMARYL) 4 MG tablet Take 1 tablet (4 mg total) by mouth daily with breakfast.   . Insulin Pen Needle (PEN NEEDLES) 32G X 4 MM MISC 1 each by Does not apply route once a week. Use as directed once per week with Ozempic   . levothyroxine (SYNTHROID) 112 MCG tablet Take 1 tablet (112 mcg total) by mouth daily before breakfast.   . Multiple Vitamin (MULTIVITAMIN WITH MINERALS) TABS tablet Take 1 tablet by  mouth daily.   . mupirocin ointment (BACTROBAN) 2 % Apply 1 application topically 2 (two) times daily.   . Omega-3 Fatty Acids (FISH OIL) 1000 MG CAPS Take by mouth.   [name] Kitchen PROCTOSOL HC 2.5 % rectal cream APPLY RECTALLY 4 TIMES A DAY FOR HEMORRHOIDS   . Semaglutide,0.25 or 0.5MG /DOS, (OZEMPIC, 0.25 OR 0.5 MG/DOSE,) 2 MG/1.5ML SOPN Inject 0.5 mLs into the muscle once a week.   . Wheat Dextrin (BENEFIBER DRINK MIX PO) Take by mouth.   . [DISCONTINUED] lisinopril (ZESTRIL) 10 MG tablet TAKE 1 TABLET BY MOUTH EVERY DAY 07/12/2019: Takes 0.5 tablets daily    No facility-administered encounter medications on file as of 07/19/2019.      Objective:   Goals Addressed            This Visit's Progress     Patient Stated   . I need help with the cost of my medications (pt-stated)       Current Barriers:  . Financial Barriers: patient has [employer] and reports copay for Ozempic is cost prohibitive at this time   Pharmacist Clinical Goal(s):  [name] Kitchen Over the next 30 days, patient will work with PharmD and providers to relieve medication access concerns  Interventions: . Comprehensive medication review completed; medication list updated in electronic medical record.  [name] Kitchen CCM PharmD assisted patient in applying for Ozempic through [place], however patient was denied based on financial reasons.  Information given to  patient on applying for LIS/extra help through [email or web address].  Also, patient was given the option to switch to [employer] product (Trulicity) as their application process is not as stringent.  Catherine [name] is agreeable to start Trulicity in October.  Will start paperwork.  Patient Self Care Activities:  . Patient will provide necessary portions of application   Initial goal documentation         Plan:   The care management team will reach out to the patient again over the next 4 weeks.   Kieth BrightlyJulie Dattero Nazarene [name], PharmD, BCPS Clinical Pharmacist, [facility]  Direct Dial: [phone]

## 2019-07-25 NOTE — Patient Instructions (Signed)
Visit Information  Goals Addressed            This Visit's Progress     Patient Stated   . I would like to control my diabetes and optimize my medication management of my chronic conditions (pt-stated)       Current Barriers:  . Diabetes: T2DM; most recent A1c 6.6% on 05/10/19 (was 8.3% on 02/06/19)--improved diet per patient report  . Current antihyperglycemic regimen: Ozempic 0.5mg , glimepiride 4mg  (work to optimize/potentially d/c glimepiride depending on patient duration of therapy on a sulfonylurea) . denies hypoglycemic symptoms; denies hyperglycemic symptoms . Current meal patterns: o Patient adhering to limiting carbohydrate intake o Decreased sugary drink intake o Increased water intake . Current exercise: walking . Current blood glucose readings: FBG: 89, 90, always <100 per patient report . Cardiovascular risk reduction: o Current hypertensive regimen: lisinopril 10mg  daily o Current hyperlipidemia regimen: atorvastatin 10mg  daily  Pharmacist Clinical Goal(s):  [name] Kitchen Over the next 90 days, patient with work with PharmD and primary care provider to address needs related to optimized medication management of chronic conditions.  Interventions: . Comprehensive medication review performed, medication list updated in electronic medical record . Comprehensive medication review performed.  Reviewed medication fill history via insurance claims data confirming patient appears compliant with having his medications filled on time as prescribed by provider. . Reviewed & discussed the following diabetes-related information with patient: o Continue checking blood sugars as directed o Follow ADA recommended "diabetes-friendly" diet  (reviewed healthy snack/food options) o Discussed GLP-1 injection technique; Patient uses AccuCheck Guide glucometer o Reviewed medication purpose/side effects-->patient denies adverse events o Continue taking all medications as prescribed by  provider   Patient Self Care Activities:  . Patient will check blood glucose daily , document, and provide at future appointments . Patient will focus on medication adherence by taking medications as prescribed . Patient will take medications as prescribed . Patient will contact provider with any episodes of hypoglycemia . Patient will report any questions or concerns to provider   Initial goal documentation        The patient verbalized understanding of instructions provided today and declined a print copy of patient instruction materials.   The care management team will reach out to the patient again over the next 4 weeks.  Regina Eck, PharmD, BCPS Clinical Pharmacist, [facility] [place]: [phone]

## 2019-07-26 ENCOUNTER — Telehealth: Payer: Medicare Other

## 2019-07-28 ENCOUNTER — Other Ambulatory Visit: Payer: Self-pay | Admitting: Pharmacy Technician

## 2019-07-28 ENCOUNTER — Ambulatory Visit: Payer: Self-pay | Admitting: Pharmacist

## 2019-07-28 DIAGNOSIS — E119 Type 2 diabetes mellitus without complications: Secondary | ICD-10-CM

## 2019-07-28 DIAGNOSIS — I10 Essential (primary) hypertension: Secondary | ICD-10-CM

## 2019-07-28 NOTE — Patient Outreach (Signed)
[facility] [facility]) Care Management  07/28/2019  Catherine [name] Apr 08, 1946 [id number]                                       Medication Assistance Referral  Referral From: [facility] R[facility] Jenne Pane [facility])  Medication/Company: Danelle Berry / Ralph Leyden Cares Patient application portion:  N/A per Sharyl Nimrod Provider application portion: Faxed  to J. Laurance Flatten, [place] Provider address/fax verified via: Office website  Follow up:  Will submit application to [employer] once all documents have been received.  [name] [name] [place] Certified Pharmacy Technician [place] Management Direct Dial:[phone]

## 2019-07-31 ENCOUNTER — Telehealth: Payer: Self-pay

## 2019-08-02 ENCOUNTER — Other Ambulatory Visit: Payer: Self-pay | Admitting: Pharmacy Technician

## 2019-08-02 NOTE — Patient Outreach (Signed)
[facility] [facility]) Care Management  08/02/2019  Catherine [name] 12-Dec-1945 [id number]   Received provider portion(s) of patient assistance application(s) for Trulicity. Faxed completed application and required documents into [employer].  Will follow up with company(ies) in 5-7 business days to check status of application(s).  [name] [name] [place] Certified Pharmacy Technician [place] Management Direct Dial:[phone]

## 2019-08-03 ENCOUNTER — Telehealth: Payer: Self-pay

## 2019-08-03 NOTE — Telephone Encounter (Signed)
Yes she can.

## 2019-08-03 NOTE — Progress Notes (Signed)
Chronic Care Management   Visit Note  07/28/2019 Name: Catherine [name] MRN: [id number] DOB: 05/27/1946  Referred by: Arnette FeltsMoore, [name], FNP Reason for referral : Chronic Care Management   Catherine [name] is a 73 y.o. year old female who is a primary care patient of Arnette FeltsMoore, [name], FNP. The CCM team was consulted for assistance with chronic disease management and care coordination needs.   Review of patient status, including review of consultants reports, relevant laboratory and other test results, and collaboration with appropriate care team members and the patient's provider was performed as part of comprehensive patient evaluation and provision of chronic care management services.     Advanced Directives Status: N See Care Plan and [employer] application for related entries.   Medications: Outpatient Encounter Medications as of 07/28/2019  Medication Sig Note  . Ascorbic Acid (VITAMIN C) 1000 MG tablet Take 1,000 mg by mouth daily.   [name] Kitchen. aspirin 81 MG tablet Take 81 mg by mouth daily.   [name] Kitchen. atorvastatin (LIPITOR) 10 MG tablet Take 10 mg by mouth daily.   . beta carotene w/minerals (OCUVITE) tablet Take 1 tablet by mouth daily. 08/17/2018: Uses preservision  . calcium citrate-vitamin D (CITRACAL+D) 315-200 MG-UNIT tablet Take 1 tablet by mouth 2 (two) times daily.   . Cyanocobalamin (VITAMIN B 12 PO) Take 1,000 mg by mouth daily.   . ferrous sulfate 325 (65 FE) MG tablet TAKE 1 TABLET BY MOUTH TWICE A DAY   . glimepiride (AMARYL) 4 MG tablet Take 1 tablet (4 mg total) by mouth daily with breakfast.   . Insulin Pen Needle (PEN NEEDLES) 32G X 4 MM MISC 1 each by Does not apply route once a week. Use as directed once per week with Ozempic   . levothyroxine (SYNTHROID) 112 MCG tablet Take 1 tablet (112 mcg total) by mouth daily before breakfast.   . lisinopril (ZESTRIL) 10 MG tablet TAKE 1 TABLET BY MOUTH EVERY DAY   . Multiple Vitamin (MULTIVITAMIN WITH MINERALS) TABS tablet Take 1 tablet by mouth  daily.   . mupirocin ointment (BACTROBAN) 2 % Apply 1 application topically 2 (two) times daily.   . Omega-3 Fatty Acids (FISH OIL) 1000 MG CAPS Take by mouth.   [name] Kitchen. PROCTOSOL HC 2.5 % rectal cream APPLY RECTALLY 4 TIMES A DAY FOR HEMORRHOIDS   . Semaglutide,0.25 or 0.5MG /DOS, (OZEMPIC, 0.25 OR 0.5 MG/DOSE,) 2 MG/1.5ML SOPN Inject 0.5 mLs into the muscle once a week.   . Wheat Dextrin (BENEFIBER DRINK MIX PO) Take by mouth.    No facility-administered encounter medications on file as of 07/28/2019.      Objective:   Goals Addressed            This Visit's Progress     Patient Stated   . I need help with the cost of my medications (pt-stated)       Current Barriers:  . Financial Barriers: patient has [employer] insurance and reports copay for Ozempic is cost prohibitive at this time   Pharmacist Clinical Goal(s):  [name] Kitchen. Over the next 30 days, patient will work with PharmD and providers to relieve medication access concerns  Interventions: . Comprehensive medication review completed; medication list updated in electronic medical record.  [name] Kitchen. CCM PharmD assisted patient in applying for Ozempic through [employer], however patient was denied based on financial reasons.  Information mailed to patient on how to apply for LIS/extra help through [email or web address].  Also, patient was given the option to switch to [employer] product (Trulicity)  as their application process is not as stringent.  [name] is agreeable to start Trulicity in October.  Will start paperwork.  Patient Self Care Activities:  . Patient will provide necessary portions of application   Please see past updates related to this goal by clicking on the "Past Updates" button in the selected goal      . I would like to control my diabetes and optimize my medication management of my chronic conditions (pt-stated)       Current Barriers:  . Diabetes: T2DM; most recent A1c 6.6% on 05/10/19 (was 8.3% on 02/06/19)--improved diet per  patient report  . Current antihyperglycemic regimen: Ozempic 0.5mg , glimepiride 4mg  (work to optimize/potentially d/c glimepiride depending on patient duration of therapy on a sulfonylurea) . denies hypoglycemic symptoms; denies hyperglycemic symptoms . Current meal patterns: o Patient adhering to limiting carbohydrate intake o Decreased sugary drink intake o Increased water intake . Current exercise: walking as able . Current blood glucose readings: FBG: 89, 90, always <100 per patient report . Cardiovascular risk reduction: o Current hypertensive regimen: lisinopril 10mg  daily o Current hyperlipidemia regimen: atorvastatin 10mg  daily  Pharmacist Clinical Goal(s):  [name] Kitchen Over the next 90 days, patient with work with PharmD and primary care provider to address needs related to optimized medication management of chronic conditions.  Interventions: . Comprehensive medication review performed, medication list updated in electronic medical record . Comprehensive medication review performed.  Reviewed medication fill history via insurance claims data confirming patient appears compliant with having his medications filled on time as prescribed by provider. . Reviewed & discussed the following diabetes-related information with patient: o Continue checking blood sugars as directed o Follow ADA recommended "diabetes-friendly" diet  (reviewed healthy snack/food options) o Discussed GLP-1 injection technique; Patient uses AccuCheck Guide glucometer o Reviewed medication purpose/side effects-->patient denies adverse events o Continue taking all medications as prescribed by provider   Patient Self Care Activities:  . Patient will check blood glucose daily , document, and provide at future appointments . Patient will focus on medication adherence by taking medications as prescribed . Patient will take medications as prescribed . Patient will contact provider with any episodes of hypoglycemia . Patient  will report any questions or concerns to provider   Initial goal documentation        Plan:   The care management team will reach out to the patient again over the next 4 weeks.  Regina Eck, PharmD, BCPS Clinical Pharmacist, [facility] [place]: [phone]

## 2019-08-03 NOTE — Patient Instructions (Signed)
Visit Information  Goals Addressed            This Visit's Progress     Patient Stated   . I need help with the cost of my medications (pt-stated)       Current Barriers:  . Financial Barriers: patient has [employer] and reports copay for Ozempic is cost prohibitive at this time   Pharmacist Clinical Goal(s):  [name] Kitchen Over the next 30 days, patient will work with PharmD and providers to relieve medication access concerns  Interventions: . Comprehensive medication review completed; medication list updated in electronic medical record.  [name] Kitchen CCM PharmD assisted patient in applying for Ozempic through [place], however patient was denied based on financial reasons.  Information mailed to patient on how to apply for LIS/extra help through [email or web address].  Also, patient was given the option to switch to [employer] product (Trulicity) as their application process is not as stringent.  [name] is agreeable to start Trulicity in October.  Will start paperwork.  Patient Self Care Activities:  . Patient will provide necessary portions of application   Please see past updates related to this goal by clicking on the "Past Updates" button in the selected goal      . I would like to control my diabetes and optimize my medication management of my chronic conditions (pt-stated)       Current Barriers:  . Diabetes: T2DM; most recent A1c 6.6% on 05/10/19 (was 8.3% on 02/06/19)--improved diet per patient report  . Current antihyperglycemic regimen: Ozempic 0.5mg , glimepiride 4mg  (work to optimize/potentially d/c glimepiride depending on patient duration of therapy on a sulfonylurea) . denies hypoglycemic symptoms; denies hyperglycemic symptoms . Current meal patterns: o Patient adhering to limiting carbohydrate intake o Decreased sugary drink intake o Increased water intake . Current exercise: walking as able . Current blood glucose readings: FBG: 89, 90, always <100 per patient  report . Cardiovascular risk reduction: o Current hypertensive regimen: lisinopril 10mg  daily o Current hyperlipidemia regimen: atorvastatin 10mg  daily  Pharmacist Clinical Goal(s):  [name] Kitchen Over the next 90 days, patient with work with PharmD and primary care provider to address needs related to optimized medication management of chronic conditions.  Interventions: . Comprehensive medication review performed, medication list updated in electronic medical record . Comprehensive medication review performed.  Reviewed medication fill history via insurance claims data confirming patient appears compliant with having his medications filled on time as prescribed by provider. . Reviewed & discussed the following diabetes-related information with patient: o Continue checking blood sugars as directed o Follow ADA recommended "diabetes-friendly" diet  (reviewed healthy snack/food options) o Discussed GLP-1 injection technique; Patient uses AccuCheck Guide glucometer o Reviewed medication purpose/side effects-->patient denies adverse events o Continue taking all medications as prescribed by provider   Patient Self Care Activities:  . Patient will check blood glucose daily , document, and provide at future appointments . Patient will focus on medication adherence by taking medications as prescribed . Patient will take medications as prescribed . Patient will contact provider with any episodes of hypoglycemia . Patient will report any questions or concerns to provider   Initial goal documentation        The patient verbalized understanding of instructions provided today and declined a print copy of patient instruction materials.   The care management team will reach out to the patient again over the next 4 weeks.  Regina Eck, PharmD, BCPS Clinical Pharmacist, [facility] [place]  Direct Dial: [phone]

## 2019-08-03 NOTE — Telephone Encounter (Signed)
Per Doreene Burke it is ok for pt to have flu and pneumonia at [facility] at the same time

## 2019-08-03 NOTE — Telephone Encounter (Signed)
[facility] shot and pneumonia shot she has to go to the pharmacy sometime today. Is it ok if she gets both shots at the same time, or should she space them out?  [phone]

## 2019-08-04 ENCOUNTER — Telehealth: Payer: Self-pay

## 2019-08-04 ENCOUNTER — Other Ambulatory Visit: Payer: Self-pay | Admitting: Pharmacy Technician

## 2019-08-04 NOTE — Patient Outreach (Signed)
[facility] [facility]) Care Management  08/04/2019  Catherine [name] 06-Jan-1946 [id number]    Follow up call placed to [facility] regarding patient assistance application(s) for Trulicity , Tomma Rakers confirms patient has been approved as of 9/16 until 11/16/19. Faxed script portion of application into [facility].  Follow up:  Will follow up with [facility] in 2-3 business days to check on shipping details.  [name] [name] [name] Certified Pharmacy Technician [place] Management Direct [phone]

## 2019-08-10 ENCOUNTER — Telehealth: Payer: Self-pay

## 2019-08-11 ENCOUNTER — Other Ambulatory Visit: Payer: Self-pay | Admitting: Pharmacy Technician

## 2019-08-11 NOTE — Patient Outreach (Signed)
[place] [facility]) Care Management  08/11/2019  Catherine [name] 1945/12/13 [id number]    Follow up call placed to [facility] regarding patient assistance shipping details for Trulicity, [name] confrims that medication order has been processed and medication is due to arrive at patient's home on 10/7.   Follow up:  Will route note to [facility] [place] to inform  [name]. [name] [place] Certified Pharmacy Technician [place] Management Direct Dial:[phone]

## 2019-08-15 ENCOUNTER — Encounter: Payer: Self-pay | Admitting: Nurse Practitioner

## 2019-08-16 ENCOUNTER — Other Ambulatory Visit: Payer: Self-pay | Admitting: Nurse Practitioner

## 2019-08-18 ENCOUNTER — Telehealth: Payer: Self-pay

## 2019-08-24 ENCOUNTER — Ambulatory Visit (INDEPENDENT_AMBULATORY_CARE_PROVIDER_SITE_OTHER): Payer: Medicare Other | Admitting: Nurse Practitioner

## 2019-08-24 ENCOUNTER — Other Ambulatory Visit: Payer: Self-pay

## 2019-08-24 ENCOUNTER — Ambulatory Visit: Payer: Medicare Other

## 2019-08-24 ENCOUNTER — Encounter: Payer: Self-pay | Admitting: Nurse Practitioner

## 2019-08-24 VITALS — BP 120/76 | HR 79 | Temp 98.3°F | Ht 65.2 in | Wt 172.0 lb

## 2019-08-24 DIAGNOSIS — E039 Hypothyroidism, unspecified: Secondary | ICD-10-CM

## 2019-08-24 DIAGNOSIS — L0201 Cutaneous abscess of face: Secondary | ICD-10-CM

## 2019-08-24 DIAGNOSIS — I10 Essential (primary) hypertension: Secondary | ICD-10-CM | POA: Diagnosis not present

## 2019-08-24 DIAGNOSIS — E2839 Other primary ovarian failure: Secondary | ICD-10-CM | POA: Diagnosis not present

## 2019-08-24 DIAGNOSIS — E119 Type 2 diabetes mellitus without complications: Secondary | ICD-10-CM

## 2019-08-24 DIAGNOSIS — Z872 Personal history of diseases of the skin and subcutaneous tissue: Secondary | ICD-10-CM

## 2019-08-24 DIAGNOSIS — Z23 Encounter for immunization: Secondary | ICD-10-CM | POA: Diagnosis not present

## 2019-08-24 MED ORDER — TETANUS-DIPHTHERIA TOXOIDS TD 2-2 LF/0.5ML IM SUSP: 0.5000 mL | Freq: Once | INTRAMUSCULAR | 0 refills | Status: AC

## 2019-08-24 NOTE — Progress Notes (Signed)
Subjective:     Patient ID: Catherine [name] , female    DOB: [birth year] , 73 y.o.   MRN: [id number]   Chief Complaint  Patient presents with  . Diabetes  . Recurrent Skin Infections    patient stated her abcess is getting better    HPI  Diabetes She presents for her follow-up diabetic visit. She has type 2 diabetes mellitus. There are no hypoglycemic associated symptoms. Pertinent negatives for hypoglycemia include no dizziness, headaches or nervousness/anxiousness. Pertinent negatives for diabetes include no blurred vision, no chest pain, no fatigue, no polydipsia, no polyphagia and no polyuria. There are no hypoglycemic complications. There are no diabetic complications. Risk factors for coronary artery disease include diabetes mellitus and sedentary lifestyle. Current diabetic treatment includes oral agent (dual therapy). She is compliant with treatment all of the time. Her weight is decreasing steadily. She is following a diabetic diet. When asked about meal planning, she reported none. She has not had a previous visit with a dietitian. She rarely participates in exercise. An ACE inhibitor/angiotensin II receptor blocker is being taken. She does not see a podiatrist.Eye exam is not current.     Past Medical History:  Diagnosis Date  . Diabetes mellitus without complication (HCC)   . Hypertension   . Thyroid disease      Family History  Problem Relation Age of Onset  . Diabetes Mellitus II Mother   . Stroke Mother   . Diabetes Mellitus II Sister   . Hypertension Sister      Current Outpatient Medications:  .  Ascorbic Acid (VITAMIN C) 1000 MG tablet, Take 1,000 mg by mouth daily., Disp: , Rfl:  .  aspirin 81 MG tablet, Take 81 mg by mouth daily., Disp: , Rfl:  .  atorvastatin (LIPITOR) 10 MG tablet, TAKE 1 TABLET BY MOUTH EVERY DAY, Disp: 90 tablet, Rfl: 0 .  beta carotene w/minerals (OCUVITE) tablet, Take 1 tablet by mouth daily., Disp: , Rfl:  .  calcium citrate-vitamin  D (CITRACAL+D) 315-200 MG-UNIT tablet, Take 1 tablet by mouth 2 (two) times daily., Disp: , Rfl:  .  Cyanocobalamin (VITAMIN B 12 PO), Take 1,000 mg by mouth daily., Disp: , Rfl:  .  ferrous sulfate 325 (65 FE) MG tablet, TAKE 1 TABLET BY MOUTH TWICE A DAY, Disp: 180 tablet, Rfl: 1 .  glimepiride (AMARYL) 4 MG tablet, Take 1 tablet (4 mg total) by mouth daily with breakfast., Disp: 30 tablet, Rfl: 0 .  Insulin Pen Needle (PEN NEEDLES) 32G X 4 MM MISC, 1 each by Does not apply route once a week. Use as directed once per week with Ozempic, Disp: 30 each, Rfl: 2 .  levothyroxine (SYNTHROID) 112 MCG tablet, Take 1 tablet (112 mcg total) by mouth daily before breakfast., Disp: 90 tablet, Rfl: 0 .  lisinopril (ZESTRIL) 10 MG tablet, TAKE 1 TABLET BY MOUTH EVERY DAY (Patient taking differently: Take 5 mg by mouth daily. Take 1/2 tablet), Disp: 90 tablet, Rfl: 0 .  Multiple Vitamin (MULTIVITAMIN WITH MINERALS) TABS tablet, Take 1 tablet by mouth daily., Disp: , Rfl:  .  mupirocin ointment (BACTROBAN) 2 %, Apply 1 application topically 2 (two) times daily., Disp: 22 g, Rfl: 0 .  Omega-3 Fatty Acids (FISH OIL) 1000 MG CAPS, Take by mouth., Disp: , Rfl:  .  PROCTOSOL HC 2.5 % rectal cream, APPLY RECTALLY 4 TIMES A DAY FOR HEMORRHOIDS, Disp: 56.7 g, Rfl: 3 .  Semaglutide,0.25 or 0.5MG /DOS, (OZEMPIC, 0.25 OR 0.5 MG/DOSE,)  2 MG/1.5ML SOPN, Inject 0.5 mLs into the muscle once a week., Disp: 3 pen, Rfl: 2 .  Wheat Dextrin (BENEFIBER DRINK MIX PO), Take by mouth., Disp: , Rfl:  .  Dulaglutide (TRULICITY) 6.22 WL/7.9GX SOPN, Inject into the skin. Inject 0.75mg  weekly, Disp: , Rfl:    No Known Allergies   Review of Systems  Constitutional: Negative for fatigue.  Eyes: Negative for blurred vision.  Respiratory: Negative.   Cardiovascular: Negative for chest pain.  Endocrine: Negative for polydipsia, polyphagia and polyuria.  Skin:       Left cheek abscess she continues to place bandaid when out  Neurological:  Negative for dizziness and headaches.  Psychiatric/Behavioral: The patient is not nervous/anxious.      Today's Vitals   08/24/19 1109  BP: 120/76  Pulse: 79  Temp: 98.3 F (36.8 C)  TempSrc: Oral  Weight: 172 lb (78 kg)  Height: 5' 5.2" (1.656 m)  PainSc: 0-No pain   Body mass index is 28.45 kg/m.   Objective:  Physical Exam Vitals signs reviewed.  Constitutional:      Appearance: Normal appearance.  Cardiovascular:     Rate and Rhythm: Normal rate and regular rhythm.     Pulses: Normal pulses.     Heart sounds: Normal heart sounds. No murmur.  Pulmonary:     Effort: Pulmonary effort is normal. No respiratory distress.     Breath sounds: Normal breath sounds.  Skin:    General: Skin is warm.     Capillary Refill: Capillary refill takes less than 2 seconds.     Comments: Left side of cheek abcess is now closed and darkened, has firm area though   Neurological:     General: No focal deficit present.     Mental Status: She is alert and oriented to person, place, and time.         Assessment And Plan:     1. Type 2 diabetes mellitus without complication, without long-term current use of insulin (HCC)  Chronic, she is doing well still has Ozempic  She will be switching to Trulicity once Ozempic is gone due to her insurance - Hemoglobin A1c - CMP14 + Anion Gap - TSH  2. Essential hypertension . B/P is controlled.  . CMP ordered to check renal function.  . The importance of regular exercise and dietary modification was stressed to the patient.  - CMP14 + Anion Gap  3. Acquired hypothyroidism  Chronic, controlled  Continue with current medications - T3 - T4, Free  4. Decreased estrogen level  Will send for bone density - DG Bone Density; Future  5. Abscess of face  Healed and closed,scarring is present  6. Encounter for immunization  Pneumonia 23 given in office   Tetanus vaccine was sent to the pharmacy - Pneumococcal polysaccharide vaccine  23-valent greater than or equal to 2yo subcutaneous/IM - diptheria-tetanus toxoids [name] Series) 2-2 LF/0.5ML injection; Inject 0.5 mLs into the muscle once for 1 dose.  Dispense: 0.5 mL; Refill: 0   [name], FNP    THE PATIENT IS ENCOURAGED TO PRACTICE SOCIAL DISTANCING DUE TO THE COVID-19 PANDEMIC.

## 2019-08-31 ENCOUNTER — Telehealth: Payer: Medicare Other

## 2019-09-04 ENCOUNTER — Telehealth: Payer: Self-pay

## 2019-09-04 ENCOUNTER — Other Ambulatory Visit: Payer: Self-pay | Admitting: Internal Medicine

## 2019-09-07 ENCOUNTER — Ambulatory Visit: Payer: Self-pay | Admitting: Pharmacist

## 2019-09-07 DIAGNOSIS — E119 Type 2 diabetes mellitus without complications: Secondary | ICD-10-CM

## 2019-09-07 DIAGNOSIS — I10 Essential (primary) hypertension: Secondary | ICD-10-CM

## 2019-09-07 NOTE — Progress Notes (Signed)
  Chronic Care Management   Outreach Note  09/07/2019 Name: Catherine [name] MRN: [id number] DOB: 09/18/1946  Referred by: [name], FNP Reason for referral : Chronic Care Management   An unsuccessful telephone outreach was attempted today. The patient was referred to the case management team by for assistance with care management and care coordination.   Follow Up Plan: The care management team will reach out to the patient again over the next 7-10 business days.   SIGNATURE Regina Eck, PharmD, BCPS Clinical Pharmacist, [facility] [place]: [phone]

## 2019-09-07 NOTE — Addendum Note (Signed)
Addended by: [name] F on: 09/07/2019 02:38 PM   Modules accepted: Level of Service

## 2019-09-08 ENCOUNTER — Ambulatory Visit: Payer: Medicare Other

## 2019-09-08 DIAGNOSIS — I10 Essential (primary) hypertension: Secondary | ICD-10-CM

## 2019-09-08 DIAGNOSIS — E119 Type 2 diabetes mellitus without complications: Secondary | ICD-10-CM

## 2019-09-08 NOTE — Chronic Care Management (AMB) (Signed)
Chronic Care Management   Social Work Note  09/08/2019 Name: Catherine [name] MRN: [id number] DOB: 1946/08/15  Catherine [name] is a 73 y.o. year old female who sees [name], [place] for primary care. The CCM team was consulted for assistance with care coordination.\  SW placed an outbound call to the patient in response to communication received by PharmD requesting SW intervention surrounding Medicare enrollment questions. Patient reports receiving a call from a woman stating to be with "[employer]" wanting to obtain consent to have the patient work with a representative on Medicare enrollment. Patient reports being given a pin number that would help patient know she is speaking with correct person when they call.  "I thought it was someone from the doctors office who referred me"  Patient then states a female named "Catherine [name]" contacted her a week later, providing the pin, stating he was from "[employer] Patient stated the man was to visit her home to discuss Medicare plans but never came and has since not called back.  SW discussed importance of never providing personal information over the phone. Determined the patient does not feel she shared unnecessary information. Provided the patient with the contact number to local [facility] Counselor to review Medicare options.    Outpatient Encounter Medications as of 09/08/2019  Medication Sig Note  . Ascorbic Acid (VITAMIN C) 1000 MG tablet Take 1,000 mg by mouth daily.   [name] Kitchen aspirin 81 MG tablet Take 81 mg by mouth daily.   [name] Kitchen atorvastatin (LIPITOR) 10 MG tablet TAKE 1 TABLET BY MOUTH EVERY DAY   . beta carotene w/minerals (OCUVITE) tablet Take 1 tablet by mouth daily. 08/17/2018: Uses preservision  . calcium citrate-vitamin D (CITRACAL+D) 315-200 MG-UNIT tablet Take 1 tablet by mouth 2 (two) times daily.   . Cyanocobalamin (VITAMIN B 12 PO) Take 1,000 mg by mouth daily.   . Dulaglutide (TRULICITY) 6.64 QI/3.4VQ  SOPN Inject into the skin. Inject 0.75mg  weekly   . ferrous sulfate 325 (65 FE) MG tablet TAKE 1 TABLET BY MOUTH TWICE A DAY   . glimepiride (AMARYL) 4 MG tablet Take 1 tablet (4 mg total) by mouth daily with breakfast.   . Insulin Pen Needle (PEN NEEDLES) 32G X 4 MM MISC 1 each by Does not apply route once a week. Use as directed once per week with Ozempic   . levothyroxine (SYNTHROID) 112 MCG tablet TAKE 1 TABLET (112 MCG TOTAL) BY MOUTH DAILY BEFORE BREAKFAST.   [name] Kitchen lisinopril (ZESTRIL) 10 MG tablet TAKE 1 TABLET BY MOUTH EVERY DAY (Patient taking differently: Take 5 mg by mouth daily. Take 1/2 tablet)   . Multiple Vitamin (MULTIVITAMIN WITH MINERALS) TABS tablet Take 1 tablet by mouth daily.   . mupirocin ointment (BACTROBAN) 2 % Apply 1 application topically 2 (two) times daily.   . Omega-3 Fatty Acids (FISH OIL) 1000 MG CAPS Take by mouth.   [name] Kitchen PROCTOSOL HC 2.5 % rectal cream APPLY RECTALLY 4 TIMES A DAY FOR HEMORRHOIDS   . Semaglutide,0.25 or 0.5MG /DOS, (OZEMPIC, 0.25 OR 0.5 MG/DOSE,) 2 MG/1.5ML SOPN Inject 0.5 mLs into the muscle once a week.   . Wheat Dextrin (BENEFIBER DRINK MIX PO) Take by mouth.    No facility-administered encounter medications on file as of 09/08/2019.     Follow Up Plan: No planned SW follow up at this time. The patient is encouraged to cotact SW with future resource needs.  Daneen Schick, BSW, CDP Social Worker, Teacher, adult education /  [facility] Care Management [phone]

## 2019-09-12 ENCOUNTER — Ambulatory Visit (INDEPENDENT_AMBULATORY_CARE_PROVIDER_SITE_OTHER): Payer: Medicare Other | Admitting: Pharmacist

## 2019-09-12 DIAGNOSIS — I10 Essential (primary) hypertension: Secondary | ICD-10-CM

## 2019-09-12 DIAGNOSIS — E119 Type 2 diabetes mellitus without complications: Secondary | ICD-10-CM

## 2019-09-14 NOTE — Patient Instructions (Signed)
Visit Information  Goals Addressed            This Visit's Progress     Patient Stated   . COMPLETED: I need help with the cost of my medications (pt-stated)       Current Barriers:  . Financial Barriers: patient has [employer] and reports copay for Ozempic is cost prohibitive at this time   Pharmacist Clinical Goal(s):  [name] Kitchen Over the next 30 days, patient will work with PharmD and providers to relieve medication access concerns  Interventions: . Comprehensive medication review completed; medication list updated in electronic medical record.  [name] Kitchen CCM PharmD assisted patient in applying for Ozempic through [place], however patient was denied based on financial reasons.  Information mailed to patient on how to apply for LIS/extra help through [email or web address].  Also, patient was given the option to switch to [employer] product (Trulicity) as their application process is not as stringent.  [name] is agreeable to start Trulicity in October.  Will start paperwork. . Trulicity delivered to patient's home-goal complete  Patient Self Care Activities:  . Patient will provide necessary portions of application   Please see past updates related to this goal by clicking on the "Past Updates" button in the selected goal      . I would like to control my diabetes and optimize my medication management of my chronic conditions (pt-stated)       Current Barriers:  . Diabetes: T2DM; most recent A1c 6.6% on 05/10/19 (was 8.3% on 02/06/19)--improved diet per patient report  . Current antihyperglycemic regimen: Ozempic 0.5mg  weekly, glimepiride 4mg  (work to optimize/potentially d/c glimepiride depending on patient duration of therapy on a sulfonylurea) o Patient will start Trulicity on the 2nd week of November o Medication has been delivered to patient's home via [employer] Patient assistance program o Discussed transition and will f/u with patient at that time . denies hypoglycemic symptoms;  denies hyperglycemic symptoms . Current meal patterns: o Patient adhering to limiting carbohydrate intake o Decreased sugary drink intake o Increased water intake . Current exercise: walking as able . Current blood glucose readings: FBG: 89, 90, always <100 per patient report . Cardiovascular risk reduction: o Current hypertensive regimen: lisinopril 10mg  daily o Current hyperlipidemia regimen: atorvastatin 10mg  daily  Pharmacist Clinical Goal(s):  [name] Kitchen Over the next 90 days, patient with work with PharmD and primary care provider to address needs related to optimized medication management of chronic conditions.  Interventions: . Comprehensive medication review performed, medication list updated in electronic medical record . Comprehensive medication review performed.  Reviewed medication fill history via insurance claims data confirming patient appears compliant with having his medications filled on time as prescribed by provider. . Reviewed & discussed the following diabetes-related information with patient: o Continue checking blood sugars as directed o Follow ADA recommended "diabetes-friendly" diet  (reviewed healthy snack/food options) o Discussed GLP-1 injection technique; Patient uses AccuCheck Guide glucometer o Reviewed medication purpose/side effects-->patient denies adverse events o Continue taking all medications as prescribed by provider   Patient Self Care Activities:  . Patient will check blood glucose daily , document, and provide at future appointments . Patient will focus on medication adherence by taking medications as prescribed . Patient will take medications as prescribed . Patient will contact provider with any episodes of hypoglycemia . Patient will report any questions or concerns to provider   Please see past updates related to this goal by clicking on the "Past Updates" button in the selected goal  The patient verbalized understanding of  instructions provided today and declined a print copy of patient instruction materials.   The care management team will reach out to the patient again over the next 2 weeks  SIGNATURE Kieth Brightly, PharmD, BCPS Clinical Pharmacist, [facility]  [facility]  Direct Dial: [phone]

## 2019-09-14 NOTE — Progress Notes (Signed)
Chronic Care Management   Visit Note  09/12/2019 Name: Catherine [name] MRN: [id number] DOB: 05-19-1946  Referred by: [name], FNP Reason for referral : Chronic Care Management   Catherine [name] is a 73 y.o. year old female who is a primary care patient of [name], [place]. The CCM team was consulted for assistance with chronic disease management and care coordination needs related to DMII  Review of patient status, including review of consultants reports, relevant laboratory and other test results, and collaboration with appropriate care team members and the patient's provider was performed as part of comprehensive patient evaluation and provision of chronic care management services.    I spoke with Catherine [name] by telephone today  Advanced Directives Status: [place] and [employer] application for related entries.   Medications: Outpatient Encounter Medications as of 09/12/2019  Medication Sig Note  . Ascorbic Acid (VITAMIN C) 1000 MG tablet Take 1,000 mg by mouth daily.   [name] Kitchen aspirin 81 MG tablet Take 81 mg by mouth daily.   [name] Kitchen atorvastatin (LIPITOR) 10 MG tablet TAKE 1 TABLET BY MOUTH EVERY DAY   . beta carotene w/minerals (OCUVITE) tablet Take 1 tablet by mouth daily. 08/17/2018: Uses preservision  . calcium citrate-vitamin D (CITRACAL+D) 315-200 MG-UNIT tablet Take 1 tablet by mouth 2 (two) times daily.   . Cyanocobalamin (VITAMIN B 12 PO) Take 1,000 mg by mouth daily.   . Dulaglutide (TRULICITY) 5.27 PO/2.4MP SOPN Inject into the skin. Inject 0.75mg  weekly   . ferrous sulfate 325 (65 FE) MG tablet TAKE 1 TABLET BY MOUTH TWICE A DAY   . glimepiride (AMARYL) 4 MG tablet Take 1 tablet (4 mg total) by mouth daily with breakfast.   . Insulin Pen Needle (PEN NEEDLES) 32G X 4 MM MISC 1 each by Does not apply route once a week. Use as directed once per week with Ozempic   . levothyroxine (SYNTHROID) 112 MCG tablet TAKE 1 TABLET (112 MCG TOTAL) BY MOUTH DAILY BEFORE BREAKFAST.    [name] Kitchen lisinopril (ZESTRIL) 10 MG tablet TAKE 1 TABLET BY MOUTH EVERY DAY (Patient taking differently: Take 5 mg by mouth daily. Take 1/2 tablet)   . Multiple Vitamin (MULTIVITAMIN WITH MINERALS) TABS tablet Take 1 tablet by mouth daily.   . mupirocin ointment (BACTROBAN) 2 % Apply 1 application topically 2 (two) times daily.   . Omega-3 Fatty Acids (FISH OIL) 1000 MG CAPS Take by mouth.   [name] Kitchen PROCTOSOL HC 2.5 % rectal cream APPLY RECTALLY 4 TIMES A DAY FOR HEMORRHOIDS   . Semaglutide,0.25 or 0.5MG /DOS, (OZEMPIC, 0.25 OR 0.5 MG/DOSE,) 2 MG/1.5ML SOPN Inject 0.5 mLs into the muscle once a week.   . Wheat Dextrin (BENEFIBER DRINK MIX PO) Take by mouth.    No facility-administered encounter medications on file as of 09/12/2019.      Objective:   Goals Addressed            This Visit's Progress     Patient Stated   . COMPLETED: I need help with the cost of my medications (pt-stated)       Current Barriers:  . Financial Barriers: patient has [employer] and reports copay for Ozempic is cost prohibitive at this time   Pharmacist Clinical Goal(s):  [name] Kitchen Over the next 30 days, patient will work with PharmD and providers to relieve medication access concerns  Interventions: . Comprehensive medication review completed; medication list updated in electronic medical record.  [name] Kitchen CCM PharmD assisted patient in applying  for Ozempic through [employer], however patient was denied based on financial reasons.  Information mailed to patient on how to apply for LIS/extra help through [email or web address]  Also, patient was given the option to switch to [employer] product (Trulicity) as their application process is not as stringent.  Catherine [name] is agreeable to start Trulicity in October.  Will start paperwork. . Trulicity delivered to patient's home-goal complete  Patient Self Care Activities:  . Patient will provide necessary portions of application   Please see past updates related to this goal by clicking  on the "Past Updates" button in the selected goal      . I would like to control my diabetes and optimize my medication management of my chronic conditions (pt-stated)       Current Barriers:  . Diabetes: T2DM; most recent A1c 6.6% on 05/10/19 (was 8.3% on 02/06/19)--improved diet per patient report  . Current antihyperglycemic regimen: Ozempic 0.5mg  weekly, glimepiride 4mg  (work to optimize/potentially d/c glimepiride depending on patient duration of therapy on a sulfonylurea) o Patient will start Trulicity on the 2nd week of November o Medication has been delivered to patient's home via [place] Patient assistance program o Discussed transition and will f/u with patient at that time . denies hypoglycemic symptoms; denies hyperglycemic symptoms . Current meal patterns: o Patient adhering to limiting carbohydrate intake o Decreased sugary drink intake o Increased water intake . Current exercise: walking as able . Current blood glucose readings: FBG: 89, 90, always <100 per patient report . Cardiovascular risk reduction: o Current hypertensive regimen: lisinopril 10mg  daily o Current hyperlipidemia regimen: atorvastatin 10mg  daily  Pharmacist Clinical Goal(s):  Catherine [name] Over the next 90 days, patient with work with PharmD and primary care provider to address needs related to optimized medication management of chronic conditions.  Interventions: . Comprehensive medication review performed, medication list updated in electronic medical record . Comprehensive medication review performed.  Reviewed medication fill history via insurance claims data confirming patient appears compliant with having his medications filled on time as prescribed by provider. . Reviewed & discussed the following diabetes-related information with patient: o Continue checking blood sugars as directed o Follow ADA recommended "diabetes-friendly" diet  (reviewed healthy snack/food options) o Discussed GLP-1 injection  technique; Patient uses AccuCheck Guide glucometer o Reviewed medication purpose/side effects-->patient denies adverse events o Continue taking all medications as prescribed by provider   Patient Self Care Activities:  . Patient will check blood glucose daily , document, and provide at future appointments . Patient will focus on medication adherence by taking medications as prescribed . Patient will take medications as prescribed . Patient will contact provider with any episodes of hypoglycemia . Patient will report any questions or concerns to provider   Please see past updates related to this goal by clicking on the "Past Updates" button in the selected goal          Plan:   The care management team will reach out to the patient again over the next 14 days.   Provider Signature , PharmD, BCPS Clinical Pharmacist, [facility]  [facility]  Direct Dial: [phone]

## 2019-09-26 ENCOUNTER — Telehealth: Payer: Medicare Other | Admitting: Pharmacist

## 2019-09-28 ENCOUNTER — Telehealth: Payer: Self-pay

## 2019-09-28 NOTE — Telephone Encounter (Signed)
Patient called stating the breast center called her to schedule her mammogram and she advised them that she has appointment with [facility] on the 17th to have her bone density and mammogram done. I asked patient if there was anything she needed [place] to do on our end she stated no she was just calling to let her know what was going on. YRL,RMA

## 2019-10-03 ENCOUNTER — Other Ambulatory Visit: Payer: Self-pay | Admitting: Nurse Practitioner

## 2019-10-03 DIAGNOSIS — M85852 Other specified disorders of bone density and structure, left thigh: Secondary | ICD-10-CM | POA: Diagnosis not present

## 2019-10-03 DIAGNOSIS — Z1231 Encounter for screening mammogram for malignant neoplasm of breast: Secondary | ICD-10-CM | POA: Diagnosis not present

## 2019-10-03 LAB — HM MAMMOGRAPHY

## 2019-10-03 LAB — HM DEXA SCAN

## 2019-10-04 ENCOUNTER — Other Ambulatory Visit: Payer: Self-pay

## 2019-10-04 ENCOUNTER — Encounter: Payer: Self-pay | Admitting: Nurse Practitioner

## 2019-10-04 ENCOUNTER — Ambulatory Visit (INDEPENDENT_AMBULATORY_CARE_PROVIDER_SITE_OTHER): Payer: Medicare Other

## 2019-10-04 ENCOUNTER — Ambulatory Visit: Payer: Medicare Other | Admitting: Nurse Practitioner

## 2019-10-04 ENCOUNTER — Ambulatory Visit: Payer: Medicare Other | Admitting: Pharmacist

## 2019-10-04 ENCOUNTER — Ambulatory Visit (INDEPENDENT_AMBULATORY_CARE_PROVIDER_SITE_OTHER): Payer: Medicare Other | Admitting: Nurse Practitioner

## 2019-10-04 VITALS — BP 122/70 | HR 72 | Temp 98.5°F | Ht 64.8 in | Wt 177.0 lb

## 2019-10-04 DIAGNOSIS — E039 Hypothyroidism, unspecified: Secondary | ICD-10-CM

## 2019-10-04 DIAGNOSIS — Z0000 Encounter for general adult medical examination without abnormal findings: Secondary | ICD-10-CM

## 2019-10-04 DIAGNOSIS — E119 Type 2 diabetes mellitus without complications: Secondary | ICD-10-CM

## 2019-10-04 DIAGNOSIS — I10 Essential (primary) hypertension: Secondary | ICD-10-CM

## 2019-10-04 DIAGNOSIS — E559 Vitamin D deficiency, unspecified: Secondary | ICD-10-CM | POA: Diagnosis not present

## 2019-10-04 NOTE — Progress Notes (Signed)
Subjective:     Patient ID: Catherine [name] , female    DOB: 1945-12-26 , 73 y.o.   MRN: [id number]   Chief Complaint  Patient presents with  . Annual Exam    HPI  Here for HM Diabetes She presents for her follow-up diabetic visit. She has type 2 diabetes mellitus. There are no hypoglycemic associated symptoms. There are no diabetic associated symptoms. Pertinent negatives for diabetes include no polydipsia, no polyphagia and no polyuria. There are no hypoglycemic complications. There are no diabetic complications. Risk factors for coronary artery disease include obesity and diabetes mellitus. Current diabetic treatment includes oral agent (dual therapy) (she is doing well with Trulicity). She is compliant with treatment all of the time. (Ranging 89-128)  Hypertension This is a chronic problem. The current episode started more than 1 year ago. The problem has been gradually improving since onset. The problem is controlled. Pertinent negatives include no anxiety. Risk factors for coronary artery disease include obesity and diabetes mellitus.    The patient states she uses status post hysterectomy for birth control. Last LMP was No LMP recorded. Patient is postmenopausal.. Negative for Dysmenorrhea and Negative for Menorrhagia Mammogram last done 10/03/2019.  Negative for: breast discharge, breast lump(s), breast pain and breast self exam.  Pertinent negatives include abnormal bleeding (hematology), anxiety, decreased libido, depression, difficulty falling sleep, dyspareunia, history of infertility, nocturia, sexual dysfunction, sleep disturbances, urinary incontinence, urinary urgency, vaginal discharge and vaginal itching. Diet regular. The patient states her exercise level is walking regularly    The patient's tobacco use is:  Social History   Tobacco Use  Smoking Status Never Smoker  Smokeless Tobacco Never Used   She has been exposed to passive smoke. The patient's alcohol use is:   Social History   Substance and Sexual Activity  Alcohol Use No  . Frequency: Never    Past Medical History:  Diagnosis Date  . Diabetes mellitus without complication ([place])   . Hypertension   . Thyroid disease      Family History  Problem Relation Age of Onset  . Diabetes Mellitus II Mother   . Stroke Mother   . Diabetes Mellitus II Sister   . Hypertension Sister      Current Outpatient Medications:  .  Accu-Chek FastClix Lancets MISC, AS DIRECTED TWICE A DAY SUBCUTANEOUS 90 DAYS, Disp: 204 each, Rfl: 4 .  Ascorbic Acid (VITAMIN C) 1000 MG tablet, Take 1,000 mg by mouth daily., Disp: , Rfl:  .  aspirin 81 MG tablet, Take 81 mg by mouth daily., Disp: , Rfl:  .  atorvastatin (LIPITOR) 10 MG tablet, TAKE 1 TABLET BY MOUTH EVERY DAY, Disp: 90 tablet, Rfl: 0 .  beta carotene w/minerals (OCUVITE) tablet, Take 1 tablet by mouth daily., Disp: , Rfl:  .  calcium citrate-vitamin D (CITRACAL+D) 315-200 MG-UNIT tablet, Take 1 tablet by mouth 2 (two) times daily., Disp: , Rfl:  .  Cyanocobalamin (VITAMIN B 12 PO), Take 1,000 mg by mouth daily., Disp: , Rfl:  .  Dulaglutide (TRULICITY) 4.31 VQ/0.0QQ SOPN, Inject into the skin. Inject 0.63m weekly, Disp: , Rfl:  .  ferrous sulfate 325 (65 FE) MG tablet, TAKE 1 TABLET BY MOUTH TWICE A DAY, Disp: 180 tablet, Rfl: 1 .  glimepiride (AMARYL) 4 MG tablet, Take 1 tablet (4 mg total) by mouth daily with breakfast., Disp: 30 tablet, Rfl: 0 .  Insulin Pen Needle (PEN NEEDLES) 32G X 4 MM MISC, 1 each by Does not apply route  once a week. Use as directed once per week with Ozempic, Disp: 30 each, Rfl: 2 .  levothyroxine (SYNTHROID) 112 MCG tablet, TAKE 1 TABLET (112 MCG TOTAL) BY MOUTH DAILY BEFORE BREAKFAST., Disp: 90 tablet, Rfl: 0 .  lisinopril (ZESTRIL) 10 MG tablet, TAKE 1 TABLET BY MOUTH EVERY DAY (Patient taking differently: Take 5 mg by mouth daily. Take 1/2 tablet), Disp: 90 tablet, Rfl: 0 .  Multiple Vitamin (MULTIVITAMIN WITH MINERALS) TABS  tablet, Take 1 tablet by mouth daily., Disp: , Rfl:  .  mupirocin ointment (BACTROBAN) 2 %, Apply 1 application topically 2 (two) times daily., Disp: 22 g, Rfl: 0 .  Omega-3 Fatty Acids (FISH OIL) 1000 MG CAPS, Take by mouth., Disp: , Rfl:  .  PROCTOSOL HC 2.5 % rectal cream, APPLY RECTALLY 4 TIMES A DAY FOR HEMORRHOIDS, Disp: 56.7 g, Rfl: 3 .  Wheat Dextrin (BENEFIBER DRINK MIX PO), Take by mouth., Disp: , Rfl:  .  Semaglutide,0.25 or 0.5MG/DOS, (OZEMPIC, 0.25 OR 0.5 MG/DOSE,) 2 MG/1.5ML SOPN, Inject 0.5 mLs into the muscle once a week. (Patient not taking: Reported on 10/04/2019), Disp: 3 pen, Rfl: 2   No Known Allergies   Review of Systems  Constitutional: Negative.   HENT: Negative.   Eyes: Negative.   Respiratory: Negative.   Cardiovascular: Negative.   Gastrointestinal: Negative.   Endocrine: Negative.  Negative for polydipsia, polyphagia and polyuria.  Genitourinary: Negative.   Musculoskeletal: Negative.   Skin: Negative.   Allergic/Immunologic: Negative.   Neurological: Negative.   Hematological: Negative.   Psychiatric/Behavioral: Negative.      Today's Vitals   10/04/19 1150  BP: 122/70  Pulse: 72  Temp: 98.5 F (36.9 C)  TempSrc: Oral  Weight: 177 lb (80.3 kg)  Height: 5' 4.8" (1.646 m)  PainSc: 0-No pain   Body mass index is 29.64 kg/m.   Objective:  Physical Exam Constitutional:      Appearance: Normal appearance. She is well-developed.  HENT:     Head: Normocephalic and atraumatic.     Right Ear: Hearing, tympanic membrane, ear canal and external ear normal.     Left Ear: Hearing, tympanic membrane, ear canal and external ear normal.     Nose: Nose normal.     Mouth/Throat:     Mouth: Mucous membranes are moist.  Eyes:     General: Lids are normal.     Conjunctiva/sclera: Conjunctivae normal.     Pupils: Pupils are equal, round, and reactive to light.     Funduscopic exam:    Right eye: No papilledema.        Left eye: No papilledema.  Neck:      Musculoskeletal: Full passive range of motion without pain, normal range of motion and neck supple.     Thyroid: No thyroid mass.     Vascular: No carotid bruit.  Cardiovascular:     Rate and Rhythm: Normal rate and regular rhythm.     Pulses: Normal pulses.     Heart sounds: Normal heart sounds. No murmur.  Pulmonary:     Effort: Pulmonary effort is normal.     Breath sounds: Normal breath sounds.  Abdominal:     General: Abdomen is flat. Bowel sounds are normal.     Palpations: Abdomen is soft.  Musculoskeletal: Normal range of motion.        General: No swelling.     Right lower leg: No edema.     Left lower leg: No edema.  Skin:  General: Skin is warm and dry.     Capillary Refill: Capillary refill takes less than 2 seconds.  Neurological:     General: No focal deficit present.     Mental Status: She is alert and oriented to person, place, and time.     Cranial Nerves: No cranial nerve deficit.     Sensory: No sensory deficit.  Psychiatric:        Mood and Affect: Mood normal.        Behavior: Behavior normal.        Thought Content: Thought content normal.        Judgment: Judgment normal.         Assessment And Plan:     1. Health maintenance examination . Behavior modifications discussed and diet history reviewed.   . Pt will continue to exercise regularly and modify diet with low GI, plant based foods and decrease intake of processed foods.  . Recommend intake of daily multivitamin, Vitamin D, and calcium.  . Recommend for preventive screenings, as well as recommend immunizations that include influenza  2. Essential hypertension . B/P is controlled.  . CMP ordered to check renal function.  . The importance of regular exercise and dietary modification was stressed to the patient.  . EKG done with NSR HR 73 - EKG 12-Lead - POCT UA - Microalbumin - POCT Urinalysis Dipstick (81002) - Hemoglobin A1c  3. Type 2 diabetes mellitus without complication, without  long-term current use of insulin (HCC)  Chronic, well controlled  She is tolerating trulicity well  Continue with current medications  Encouraged to limit intake of sugary foods and drinks  Encouraged to increase physical activity to 150 minutes per week as tolerated  Diabetic foot exam done - normal - Hemoglobin A1c - CMP14+EGFR  4. Acquired hypothyroidism  Chronic, controlled  Continue with current medications, will make changes accordingly pending labs - TSH - T3 - T4, Free  5. Vitamin D deficiency  Will check vitamin D level and supplement as needed.     Also encouraged to spend 15 minutes in the sun daily.  - Vitamin D 1,25 Dihydroxy          [name], FNP    THE PATIENT IS ENCOURAGED TO PRACTICE SOCIAL DISTANCING DUE TO THE COVID-19 PANDEMIC.

## 2019-10-04 NOTE — Progress Notes (Signed)
Subjective:   Catherine [name] is a 73 y.o. female who presents for Medicare Annual (Subsequent) preventive examination.  Review of Systems:  n/a Cardiac Risk Factors include: advanced age (>25men, >7 women);diabetes mellitus;hypertension     Objective:     Vitals: BP 122/70 (BP Location: Left Arm, Patient Position: Sitting)    Pulse 72    Temp 98.5 F (36.9 C) (Oral)    Ht 5' 4.8" (1.646 m)    Wt 177 lb (80.3 kg)    BMI 29.64 kg/m   Body mass index is 29.64 kg/m.  Advanced Directives 10/04/2019 08/17/2018 12/21/2017 03/16/2017 08/13/2014  Does Patient Have a Medical Advance Directive? Yes Yes No Yes No  Type of Estate agent of [place];Living will - - - -  Does patient want to make changes to medical advance directive? - - - Yes (MAU/Ambulatory/Procedural Areas - Information given) -  Copy of Healthcare Power of Attorney in Chart? No - copy requested - - - -    Tobacco Social History   Tobacco Use  Smoking Status Never Smoker  Smokeless Tobacco Never Used     Counseling given: Not Answered   Clinical Intake:  Pre-visit preparation completed: Yes  Pain : No/denies pain     Nutritional Status: BMI 25 -29 Overweight Nutritional Risks: None Diabetes: Yes CBG done?: No Did pt. bring in CBG monitor from home?: No  How often do you need to have someone help you when you read instructions, pamphlets, or other written materials from your doctor or pharmacy?: 1 - Never What is the last grade level you completed in school?: 12th grade  Interpreter Needed?: No  Information entered by :: NAllen LPN  Past Medical History:  Diagnosis Date   Diabetes mellitus without complication (HCC)    Hypertension    Thyroid disease    Past Surgical History:  Procedure Laterality Date   CHOLECYSTECTOMY     Family History  Problem Relation Age of Onset   Diabetes Mellitus II Mother    Stroke Mother    Diabetes Mellitus II Sister    Hypertension  Sister    Social History   Socioeconomic History   Marital status: Married    Spouse name: Not on file   Number of children: Not on file   Years of education: Not on file   Highest education level: Not on file  Occupational History   Occupation: retired  Ecologist strain: Not hard at all   Food insecurity    Worry: Never true    Inability: Never true   Transportation needs    Medical: No    Non-medical: No  Tobacco Use   Smoking status: Never Smoker   Smokeless tobacco: Never Used  Substance and Sexual Activity   Alcohol use: No    Frequency: Never   Drug use: No   Sexual activity: Yes  Lifestyle   Physical activity    Days per week: 4 days    Minutes per session: 20 min   Stress: Only a little  Relationships   Social connections    Talks on phone: Not on file    Gets together: Not on file    Attends religious service: Not on file    Active member of club or organization: Not on file    Attends meetings of clubs or organizations: Not on file    Relationship status: Not on file  Other Topics Concern   Not on file  Social History Narrative   Not on file    Outpatient Encounter Medications as of 10/04/2019  Medication Sig   Accu-Chek FastClix Lancets MISC AS DIRECTED TWICE A DAY SUBCUTANEOUS 90 DAYS   Ascorbic Acid (VITAMIN C) 1000 MG tablet Take 1,000 mg by mouth daily.   aspirin 81 MG tablet Take 81 mg by mouth daily.   atorvastatin (LIPITOR) 10 MG tablet TAKE 1 TABLET BY MOUTH EVERY DAY   beta carotene w/minerals (OCUVITE) tablet Take 1 tablet by mouth daily.   calcium citrate-vitamin D (CITRACAL+D) 315-200 MG-UNIT tablet Take 1 tablet by mouth 2 (two) times daily.   Cyanocobalamin (VITAMIN B 12 PO) Take 1,000 mg by mouth daily.   Dulaglutide (TRULICITY) 4.09 WJ/1.9JY SOPN Inject into the skin. Inject 0.75mg  weekly   ferrous sulfate 325 (65 FE) MG tablet TAKE 1 TABLET BY MOUTH TWICE A DAY   glimepiride  (AMARYL) 4 MG tablet Take 1 tablet (4 mg total) by mouth daily with breakfast.   Insulin Pen Needle (PEN NEEDLES) 32G X 4 MM MISC 1 each by Does not apply route once a week. Use as directed once per week with Ozempic   levothyroxine (SYNTHROID) 112 MCG tablet TAKE 1 TABLET (112 MCG TOTAL) BY MOUTH DAILY BEFORE BREAKFAST.   lisinopril (ZESTRIL) 10 MG tablet TAKE 1 TABLET BY MOUTH EVERY DAY (Patient taking differently: Take 5 mg by mouth daily. Take 1/2 tablet)   Multiple Vitamin (MULTIVITAMIN WITH MINERALS) TABS tablet Take 1 tablet by mouth daily.   mupirocin ointment (BACTROBAN) 2 % Apply 1 application topically 2 (two) times daily.   Omega-3 Fatty Acids (FISH OIL) 1000 MG CAPS Take by mouth.   PROCTOSOL HC 2.5 % rectal cream APPLY RECTALLY 4 TIMES A DAY FOR HEMORRHOIDS   Wheat Dextrin (BENEFIBER DRINK MIX PO) Take by mouth.   [DISCONTINUED] Semaglutide,0.25 or 0.5MG /DOS, (OZEMPIC, 0.25 OR 0.5 MG/DOSE,) 2 MG/1.5ML SOPN Inject 0.5 mLs into the muscle once a week. (Patient not taking: Reported on 10/04/2019)   No facility-administered encounter medications on file as of 10/04/2019.     Activities of Daily Living In your present state of health, do you have any difficulty performing the following activities: 10/04/2019 10/04/2019  Hearing? N N  Vision? N N  Difficulty concentrating or making decisions? N N  Walking or climbing stairs? N N  Dressing or bathing? N N  Doing errands, shopping? N N  Preparing Food and eating ? N -  Using the Toilet? N -  In the past six months, have you accidently leaked urine? N -  Do you have problems with loss of bowel control? N -  Managing your Medications? N -  Managing your Finances? N -  Housekeeping or managing your Housekeeping? N -  Some recent data might be hidden    Patient Care Team: [name], FNP as PCP - General ([place]) Brunetta Genera, MD as Consulting Physician (Hematology) Lavera Guise, [facility]  (Pharmacist) Daneen Schick as Social Worker    Assessment:   This is a routine wellness examination for [place].  Exercise Activities and Dietary recommendations Current Exercise Habits: Home exercise routine, Type of exercise: walking, Time (Minutes): 20, Frequency (Times/Week): 4, Weekly Exercise (Minutes/Week): 80  Goals     HEMOGLOBIN A1C < 7.0 (pt-stated)     I would like to control my diabetes and optimize my medication management of my chronic conditions (pt-stated)     Current Barriers:   Diabetes: T2DM; most recent A1c 6.6% on  05/10/19 (was 8.3% on 02/06/19)--improved diet per patient report   Current antihyperglycemic regimen: Ozempic 0.5mg  weekly, glimepiride  (work to optimize/potentially d/c glimepiride depending on patient duration of therapy on a sulfonylurea) o Patient will start Trulicity on the 2nd week of November o Medication has been delivered to patient's home via [employer] Patient assistance program o Discussed transition and will f/u with patient at that time  denies hypoglycemic symptoms; denies hyperglycemic symptoms  Current meal patterns: o Patient adhering to limiting carbohydrate intake o Decreased sugary drink intake o Increased water intake  Current exercise: walking as able  Current blood glucose readings: FBG: 89, 90, always <100 per patient report  Cardiovascular risk reduction: o Current hypertensive regimen: lisinopril  daily o Current hyperlipidemia regimen: atorvastatin  daily  Pharmacist Clinical Goal(s):   Over the next 90 days, patient with work with PharmD and primary care provider to address needs related to optimized medication management of chronic conditions.  Interventions:  Comprehensive medication review performed, medication list updated in electronic medical record  Comprehensive medication review performed.  Reviewed medication fill history via insurance claims data confirming patient appears compliant with  having his medications filled on time as prescribed by provider.  Reviewed & discussed the following diabetes-related information with patient: o Continue checking blood sugars as directed o Follow ADA recommended "diabetes-friendly" diet  (reviewed healthy snack/food options) o Discussed GLP-1 injection technique; Patient uses AccuCheck Guide glucometer o Reviewed medication purpose/side effects-->patient denies adverse events o Continue taking all medications as prescribed by provider   Patient Self Care Activities:   Patient will check blood glucose daily , document, and provide at future appointments  Patient will focus on medication adherence by taking medications as prescribed  Patient will take medications as prescribed  Patient will contact provider with any episodes of hypoglycemia  Patient will report any questions or concerns to provider   Please see past updates related to this goal by clicking on the "Past Updates" button in the selected goal       Patient Stated     10/04/2019, wants to stay healthy and avoid the virus       Fall Risk Fall Risk  10/04/2019 10/04/2019 08/24/2019 07/17/2019 07/12/2019  Falls in the past year? 0 0 0 0 0  Comment - - - - -  Risk for fall due to : Medication side effect - - - -  Follow up Falls evaluation completed;Education provided;Falls prevention discussed - - - -   Is the patient's home free of loose throw rugs in walkways, pet beds, electrical cords, etc?   yes      Grab bars in the bathroom? no      Handrails on the stairs?   no      Adequate lighting?   yes  Timed Get Up and Go performed: n/a  Depression Screen PHQ 2/9 Scores 10/04/2019 10/04/2019 08/24/2019 07/19/2019  PHQ - 2 Score 0 0 0 0  PHQ- 9 Score 0 - - -     Cognitive Function     6CIT Screen 10/04/2019 08/17/2018  What Year? 0 points 0 points  What month? 0 points 0 points  What time? 0 points 0 points  Count back from 20 0 points 4 points  Months in  reverse 0 points 0 points  Repeat phrase 2 points 0 points  Total Score 2 4    Immunization History  Administered Date(s) Administered   Influenza,inj,Quad PF,6+ Mos 08/16/2014   Influenza-Unspecified 08/08/2018, 08/11/2019  Pneumococcal Polysaccharide-23 08/24/2019   Tdap 09/18/2019    Qualifies for Shingles Vaccine? yes  Screening Tests Health Maintenance  Topic Date Due   OPHTHALMOLOGY EXAM  07/06/1956   DEXA SCAN  07/06/2012   HEMOGLOBIN A1C  11/09/2019   FOOT EXAM  08/23/2020   PNA vac Low Risk Adult (2 of 2 - PCV13) 08/23/2020   MAMMOGRAM  09/15/2020   COLONOSCOPY  02/25/2026   TETANUS/TDAP  09/17/2029   INFLUENZA VACCINE  Completed   Hepatitis C Screening  Completed    Cancer Screenings: Lung: Low Dose CT Chest recommended if Age 71-80 years, 30 pack-year currently smoking OR have quit w/in 15years. Patient does not qualify. Breast:  Up to date on Mammogram? Yes   Up to date of Bone Density/Dexa? Yes Colorectal: up to date  Additional Screenings: : Hepatitis C Screening: 08/14/2014     Plan:    Patient wants to stay healthy and avoid the virus.   I have personally reviewed and noted the following in the patients chart:    Medical and social history  Use of alcohol, tobacco or illicit drugs   Current medications and supplements  Functional ability and status  Nutritional status  Physical activity  Advanced directives  List of other physicians  Hospitalizations, surgeries, and ER visits in previous 12 months  Vitals  Screenings to include cognitive, depression, and falls  Referrals and appointments  In addition, I have reviewed and discussed with patient certain preventive protocols, quality metrics, and best practice recommendations. A written personalized care plan for preventive services as well as general preventive health recommendations were provided to patient.     Barb Merinoickeah E Emmry Hinsch, LPN  16/10/960411/18/2020

## 2019-10-04 NOTE — Patient Instructions (Signed)
Catherine [name] , Thank you for taking time to come for your Medicare Wellness Visit. I appreciate your ongoing commitment to your health goals. Please review the following plan we discussed and let me know if I can assist you in the future.   Screening recommendations/referrals: Colonoscopy: 02/2016 Mammogram: 09/2019 Bone Density: 09/2019 Recommended yearly ophthalmology/optometry visit for glaucoma screening and checkup Recommended yearly dental visit for hygiene and checkup  Vaccinations: Influenza vaccine: 07/2019 Pneumococcal vaccine: 08/2019 Tdap vaccine: 09/2019 Shingles vaccine: discussed    Advanced directives: Please bring a copy of your POA (Power of Rondo) and/or Living Will to your next appointment.    Conditions/risks identified: overweight  Next appointment:    Preventive Care 48 Years and Older, Female Preventive care refers to lifestyle choices and visits with your health care provider that can promote health and wellness. What does preventive care include?  A yearly physical exam. This is also called an annual well check.  Dental exams once or twice a year.  Routine eye exams. Ask your health care provider how often you should have your eyes checked.  Personal lifestyle choices, including:  Daily care of your teeth and gums.  Regular physical activity.  Eating a healthy diet.  Avoiding tobacco and drug use.  Limiting alcohol use.  Practicing safe sex.  Taking low-dose aspirin every day.  Taking vitamin and mineral supplements as recommended by your health care provider. What happens during an annual well check? The services and screenings done by your health care provider during your annual well check will depend on your age, overall health, lifestyle risk factors, and family history of disease. Counseling  Your health care provider may ask you questions about your:  Alcohol use.  Tobacco use.  Drug use.  Emotional well-being.  Home and  relationship well-being.  Sexual activity.  Eating habits.  History of falls.  Memory and ability to understand (cognition).  Work and work Statistician.  Reproductive health. Screening  You may have the following tests or measurements:  Height, weight, and BMI.  Blood pressure.  Lipid and cholesterol levels. These may be checked every 5 years, or more frequently if you are over 39 years old.  Skin check.  Lung cancer screening. You may have this screening every year starting at age 45 if you have a 30-pack-year history of smoking and currently smoke or have quit within the past 15 years.  Fecal occult blood test (FOBT) of the stool. You may have this test every year starting at age 76.  Flexible sigmoidoscopy or colonoscopy. You may have a sigmoidoscopy every 5 years or a colonoscopy every 10 years starting at age 52.  Hepatitis C blood test.  Hepatitis B blood test.  Sexually transmitted disease (STD) testing.  Diabetes screening. This is done by checking your blood sugar (glucose) after you have not eaten for a while (fasting). You may have this done every 1-3 years.  Bone density scan. This is done to screen for osteoporosis. You may have this done starting at age 88.  Mammogram. This may be done every 1-2 years. Talk to your health care provider about how often you should have regular mammograms. Talk with your health care provider about your test results, treatment options, and if necessary, the need for more tests. Vaccines  Your health care provider may recommend certain vaccines, such as:  Influenza vaccine. This is recommended every year.  Tetanus, diphtheria, and acellular pertussis (Tdap, Td) vaccine. You may need a Td booster every 10  years.  Zoster vaccine. You may need this after age 60.  Pneumococcal 13-valent conjugate (PCV13) vaccine. One dose is recommended after age 57.  Pneumococcal polysaccharide (PPSV23) vaccine. One dose is recommended after  age 52. Talk to your health care provider about which screenings and vaccines you need and how often you need them. This information is not intended to replace advice given to you by your health care provider. Make sure you discuss any questions you have with your health care provider. Document Released: 11/29/2015 Document Revised: 07/22/2016 Document Reviewed: 09/03/2015 Elsevier Interactive Patient Education  2017 [place] Prevention in the Home Falls can cause injuries. They can happen to people of all ages. There are many things you can do to make your home safe and to help prevent falls. What can I do on the outside of my home?  Regularly fix the edges of walkways and driveways and fix any cracks.  Remove anything that might make you trip as you walk through a door, such as a raised step or threshold.  Trim any bushes or trees on the path to your home.  Use bright outdoor lighting.  Clear any walking paths of anything that might make someone trip, such as rocks or tools.  Regularly check to see if handrails are loose or broken. Make sure that both sides of any steps have handrails.  Any raised decks and porches should have guardrails on the edges.  Have any leaves, snow, or ice cleared regularly.  Use sand or salt on walking paths during winter.  Clean up any spills in your garage right away. This includes oil or grease spills. What can I do in the bathroom?  Use night lights.  Install grab bars by the toilet and in the tub and shower. Do not use towel bars as grab bars.  Use non-skid mats or decals in the tub or shower.  If you need to sit down in the shower, use a plastic, non-slip stool.  Keep the floor dry. Clean up any water that spills on the floor as soon as it happens.  Remove soap buildup in the tub or shower regularly.  Attach bath mats securely with double-sided non-slip rug tape.  Do not have throw rugs and other things on the floor that can  make you trip. What can I do in the bedroom?  Use night lights.  Make sure that you have a light by your bed that is easy to reach.  Do not use any sheets or blankets that are too big for your bed. They should not hang down onto the floor.  Have a firm chair that has side arms. You can use this for support while you get dressed.  Do not have throw rugs and other things on the floor that can make you trip. What can I do in the kitchen?  Clean up any spills right away.  Avoid walking on wet floors.  Keep items that you use a lot in easy-to-reach places.  If you need to reach something above you, use a strong step stool that has a grab bar.  Keep electrical cords out of the way.  Do not use floor polish or wax that makes floors slippery. If you must use wax, use non-skid floor wax.  Do not have throw rugs and other things on the floor that can make you trip. What can I do with my stairs?  Do not leave any items on the stairs.  Make sure that  there are handrails on both sides of the stairs and use them. Fix handrails that are broken or loose. Make sure that handrails are as long as the stairways.  Check any carpeting to make sure that it is firmly attached to the stairs. Fix any carpet that is loose or worn.  Avoid having throw rugs at the top or bottom of the stairs. If you do have throw rugs, attach them to the floor with carpet tape.  Make sure that you have a light switch at the top of the stairs and the bottom of the stairs. If you do not have them, ask someone to add them for you. What else can I do to help prevent falls?  Wear shoes that:  Do not have high heels.  Have rubber bottoms.  Are comfortable and fit you well.  Are closed at the toe. Do not wear sandals.  If you use a stepladder:  Make sure that it is fully opened. Do not climb a closed stepladder.  Make sure that both sides of the stepladder are locked into place.  Ask someone to hold it for you,  if possible.  Clearly mark and make sure that you can see:  Any grab bars or handrails.  First and last steps.  Where the edge of each step is.  Use tools that help you move around (mobility aids) if they are needed. These include:  Canes.  Walkers.  Scooters.  Crutches.  Turn on the lights when you go into a dark area. Replace any light bulbs as soon as they burn out.  Set up your furniture so you have a clear path. Avoid moving your furniture around.  If any of your floors are uneven, fix them.  If there are any pets around you, be aware of where they are.  Review your medicines with your doctor. Some medicines can make you feel dizzy. This can increase your chance of falling. Ask your doctor what other things that you can do to help prevent falls. This information is not intended to replace advice given to you by your health care provider. Make sure you discuss any questions you have with your health care provider. Document Released: 08/29/2009 Document Revised: 04/09/2016 Document Reviewed: 12/07/2014 Elsevier Interactive Patient Education  2017 [employer].

## 2019-10-05 ENCOUNTER — Other Ambulatory Visit: Payer: Self-pay | Admitting: Nurse Practitioner

## 2019-10-05 DIAGNOSIS — E559 Vitamin D deficiency, unspecified: Secondary | ICD-10-CM | POA: Diagnosis not present

## 2019-10-05 DIAGNOSIS — I10 Essential (primary) hypertension: Secondary | ICD-10-CM | POA: Diagnosis not present

## 2019-10-05 DIAGNOSIS — E119 Type 2 diabetes mellitus without complications: Secondary | ICD-10-CM | POA: Diagnosis not present

## 2019-10-05 DIAGNOSIS — E039 Hypothyroidism, unspecified: Secondary | ICD-10-CM | POA: Diagnosis not present

## 2019-10-05 LAB — POCT URINALYSIS DIPSTICK
Bilirubin, UA: NEGATIVE
Blood, UA: NEGATIVE
Glucose, UA: NEGATIVE
Ketones, UA: NEGATIVE
Nitrite, UA: NEGATIVE
Protein, UA: NEGATIVE
Spec Grav, UA: 1.02 (ref 1.010–1.025)
Urobilinogen, UA: 0.2 E.U./dL
pH, UA: 5.5 (ref 5.0–8.0)

## 2019-10-05 LAB — POCT UA - MICROALBUMIN
Albumin/Creatinine Ratio, Urine, POC: 30
Creatinine, POC: 200 mg/dL
Microalbumin Ur, POC: 10 mg/L

## 2019-10-05 IMAGING — US US RENAL
1 series · 14 of 25 positions shown · non-contrast
Comparison: 02/10/2006 abdominal sonogram.

CLINICAL DATA: 71-year-old female with chronic kidney disease.
Initial encounter.

EXAM:
RENAL / URINARY TRACT ULTRASOUND COMPLETE

[Series 1: us renal · 0.23mm/px · 14 of 28 slices shown]
[im 1/28]
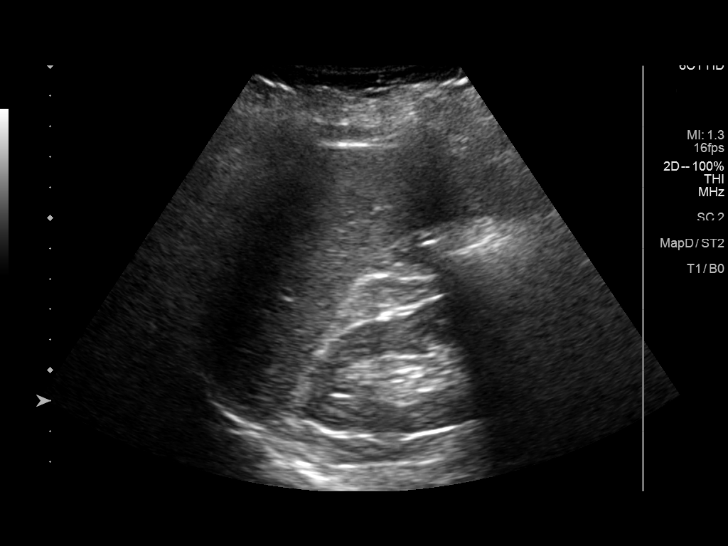
[im 3/28]
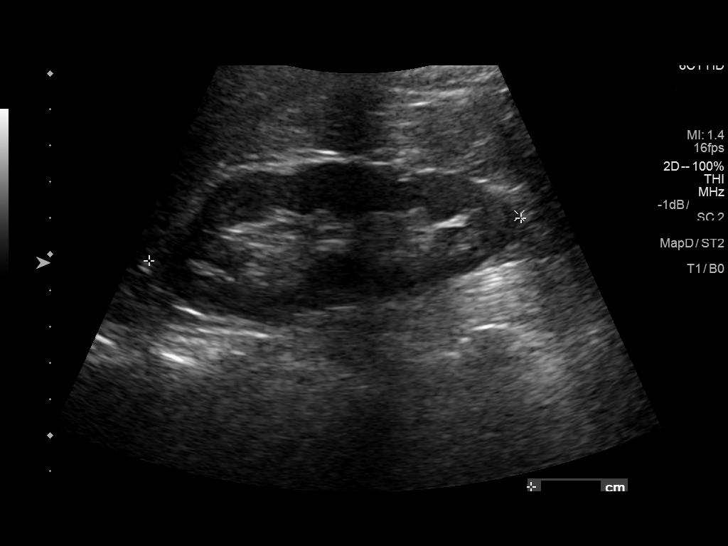
[im 5/28]
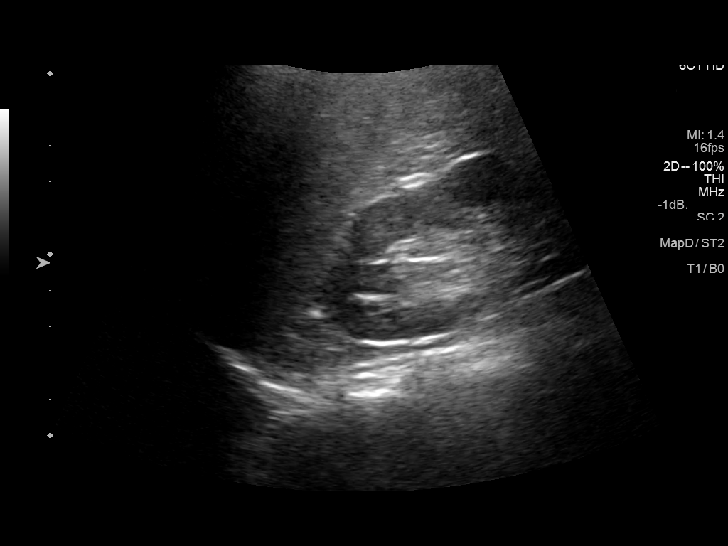
[im 7/28]
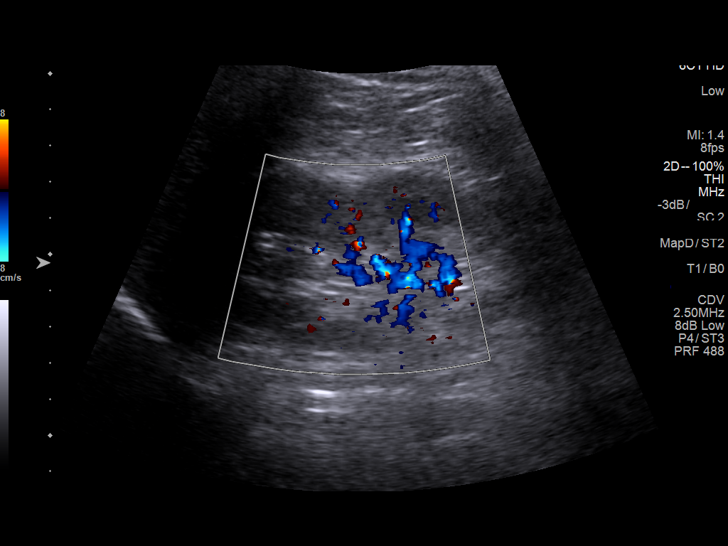
[im 10/28]
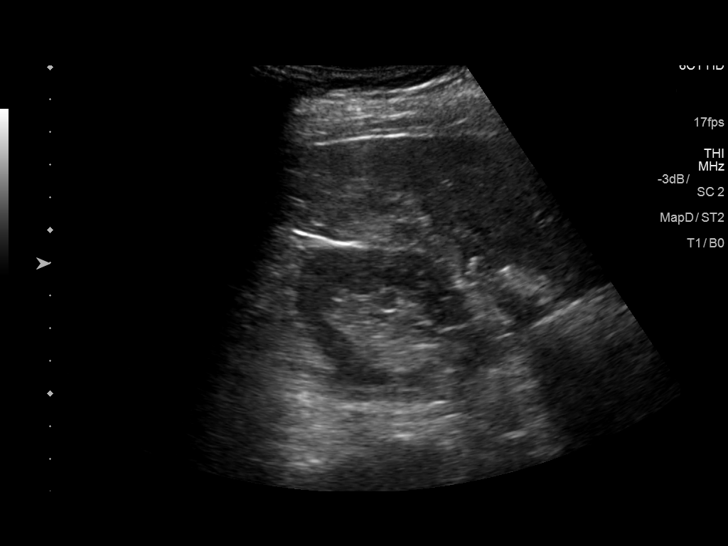
[im 11/28]
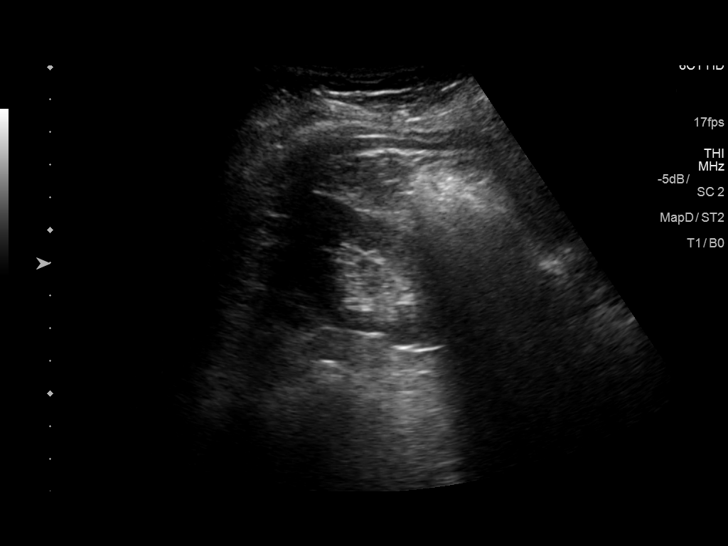
[im 13/28]
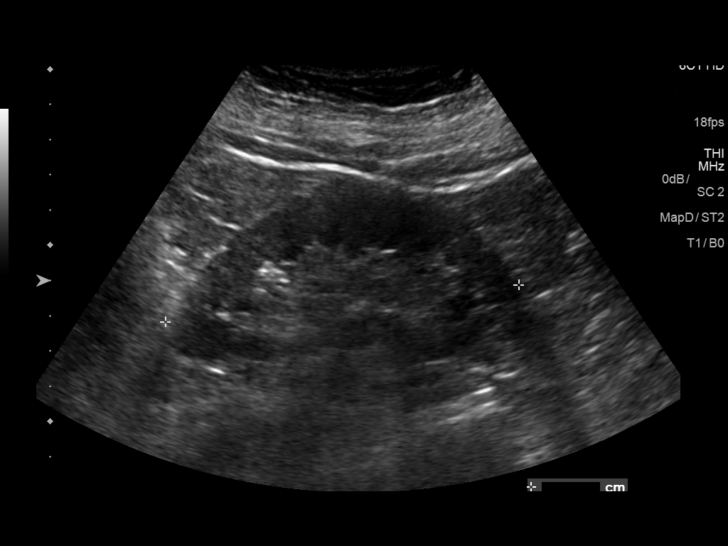
[im 15/28]
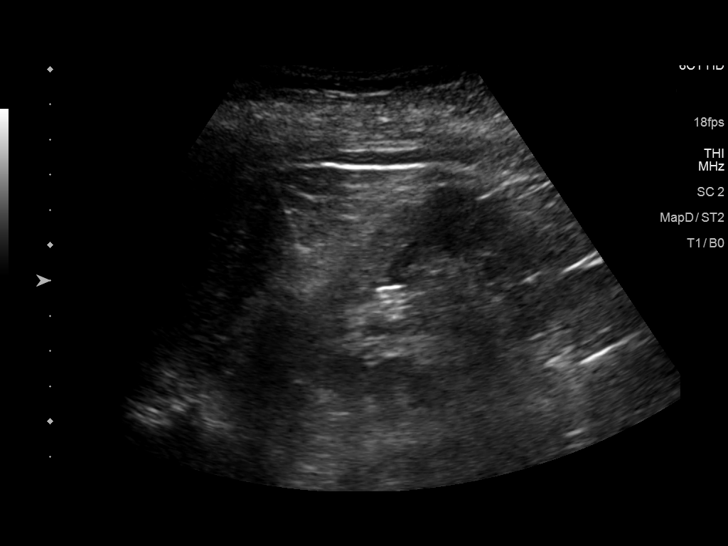
[im 17/28]
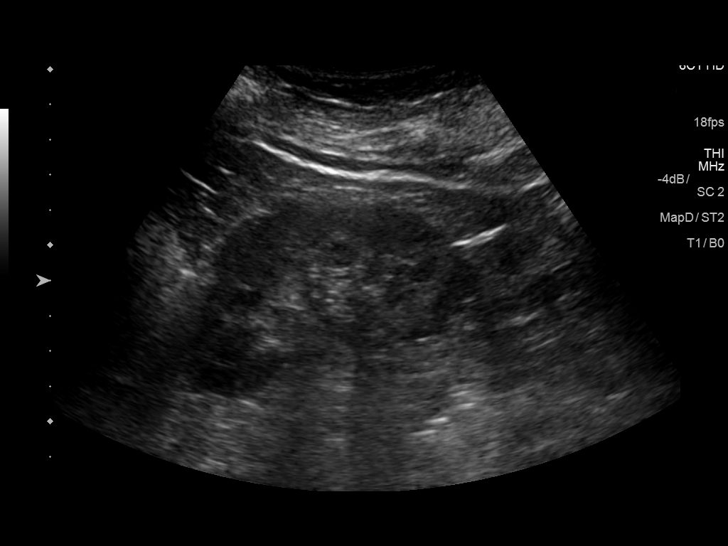
[im 19/28]
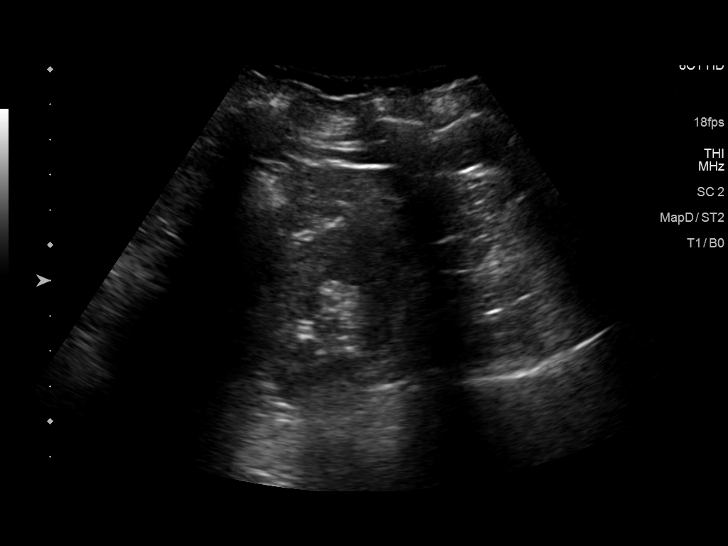
[im 21/28]
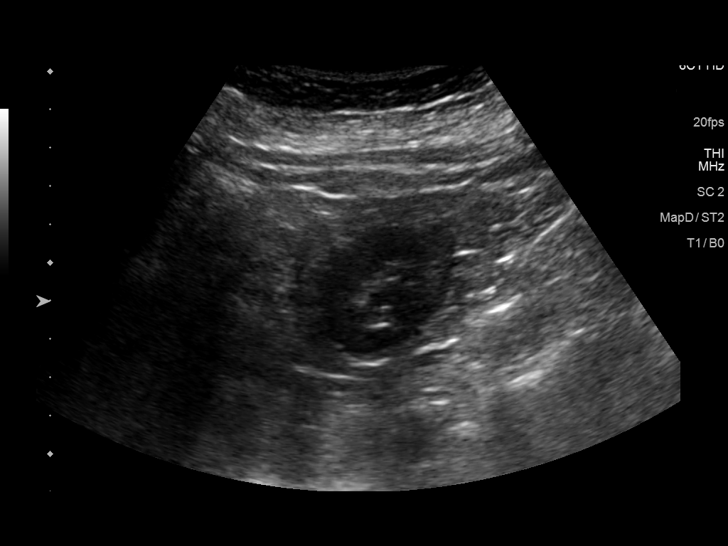
[im 23/28]
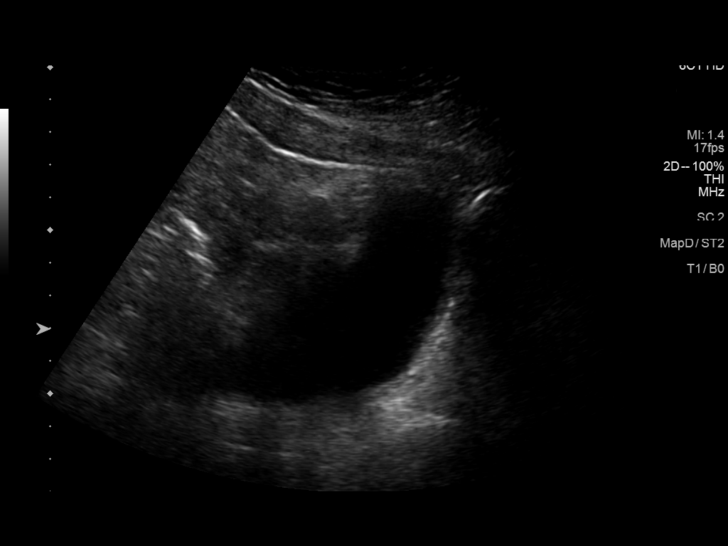
[im 25/28]
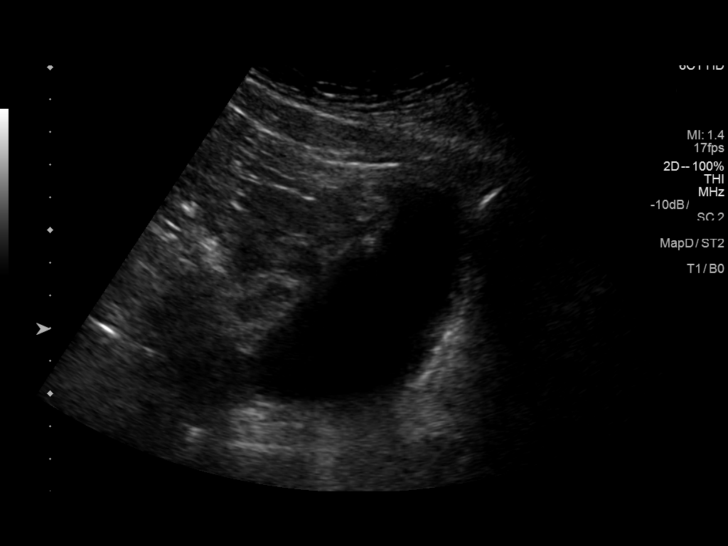
[im 28/28]
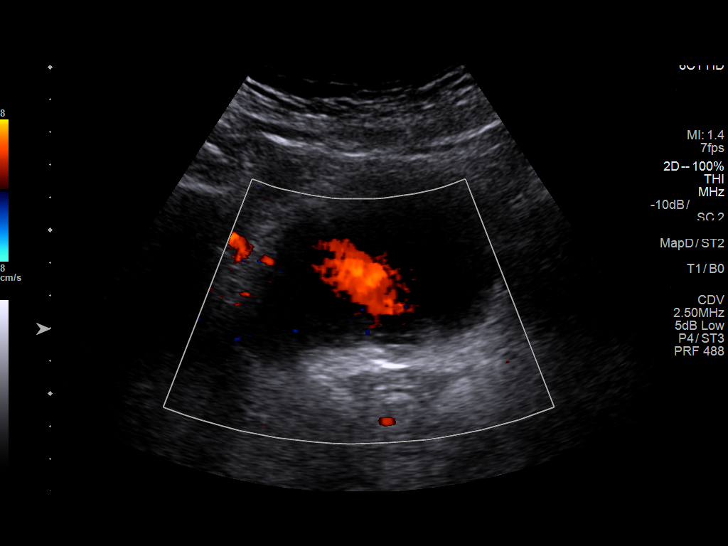

[14 of 25 positions shown; findings below may reference images not displayed]

FINDINGS: Right Kidney:

Length: 10.6 cm. Echogenicity within normal limits. No mass or
hydronephrosis visualized.

Left Kidney:

Length: 10.1 cm. Echogenicity within normal limits. No mass or
hydronephrosis visualized.

Bladder:

Appears normal for degree of bladder distention. Left ureteral jet
noted.
IMPRESSION: Negative renal sonogram.

## 2019-10-09 ENCOUNTER — Telehealth: Payer: Self-pay

## 2019-10-10 ENCOUNTER — Telehealth: Payer: Medicare Other

## 2019-10-10 ENCOUNTER — Encounter: Payer: Self-pay | Admitting: Nurse Practitioner

## 2019-10-10 ENCOUNTER — Other Ambulatory Visit: Payer: Self-pay

## 2019-10-10 MED ORDER — ACCU-CHEK FASTCLIX LANCETS MISC: 4 refills | Status: DC

## 2019-10-10 NOTE — Patient Instructions (Signed)
Health Maintenance After Age 72 After age 72, you are at a higher risk for certain long-term diseases and infections as well as injuries from falls. Falls are a major cause of broken bones and head injuries in people who are older than age 72. Getting regular preventive care can help to keep you healthy and well. Preventive care includes getting regular testing and making lifestyle changes as recommended by your health care provider. Talk with your health care provider about:  Which screenings and tests you should have. A screening is a test that checks for a disease when you have no symptoms.  A diet and exercise plan that is right for you. What should I know about screenings and tests to prevent falls? Screening and testing are the best ways to find a health problem early. Early diagnosis and treatment give you the best chance of managing medical conditions that are common after age 72. Certain conditions and lifestyle choices may make you more likely to have a fall. Your health care provider may recommend:  Regular vision checks. Poor vision and conditions such as cataracts can make you more likely to have a fall. If you wear glasses, make sure to get your prescription updated if your vision changes.  Medicine review. Work with your health care provider to regularly review all of the medicines you are taking, including over-the-counter medicines. Ask your health care provider about any side effects that may make you more likely to have a fall. Tell your health care provider if any medicines that you take make you feel dizzy or sleepy.  Osteoporosis screening. Osteoporosis is a condition that causes the bones to get weaker. This can make the bones weak and cause them to break more easily.  Blood pressure screening. Blood pressure changes and medicines to control blood pressure can make you feel dizzy.  Strength and balance checks. Your health care provider may recommend certain tests to check your  strength and balance while standing, walking, or changing positions.  Foot health exam. Foot pain and numbness, as well as not wearing proper footwear, can make you more likely to have a fall.  Depression screening. You may be more likely to have a fall if you have a fear of falling, feel emotionally low, or feel unable to do activities that you used to do.  Alcohol use screening. Using too much alcohol can affect your balance and may make you more likely to have a fall. What actions can I take to lower my risk of falls? General instructions  Talk with your health care provider about your risks for falling. Tell your health care provider if: ? You fall. Be sure to tell your health care provider about all falls, even ones that seem minor. ? You feel dizzy, sleepy, or off-balance.  Take over-the-counter and prescription medicines only as told by your health care provider. These include any supplements.  Eat a healthy diet and maintain a healthy weight. A healthy diet includes low-fat dairy products, low-fat (lean) meats, and fiber from whole grains, beans, and lots of fruits and vegetables. Home safety  Remove any tripping hazards, such as rugs, cords, and clutter.  Install safety equipment such as grab bars in bathrooms and safety rails on stairs.  Keep rooms and walkways well-lit. Activity   Follow a regular exercise program to stay fit. This will help you maintain your balance. Ask your health care provider what types of exercise are appropriate for you.  If you need a cane or   walker, use it as recommended by your health care provider.  Wear supportive shoes that have nonskid soles. Lifestyle  Do not drink alcohol if your health care provider tells you not to drink.  If you drink alcohol, limit how much you have: ? 0-1 drink a day for women. ? 0-2 drinks a day for men.  Be aware of how much alcohol is in your drink. In the [place] one drink equals one typical bottle of beer (12  oz), one-half glass of wine (5 oz), or one shot of hard liquor (1 oz).  Do not use any products that contain nicotine or tobacco, such as cigarettes and e-cigarettes. If you need help quitting, ask your health care provider. Summary  Having a healthy lifestyle and getting preventive care can help to protect your health and wellness after age 72.  Screening and testing are the best way to find a health problem early and help you avoid having a fall. Early diagnosis and treatment give you the best chance for managing medical conditions that are more common for people who are older than age 72.  Falls are a major cause of broken bones and head injuries in people who are older than age 72. Take precautions to prevent a fall at home.  Work with your health care provider to learn what changes you can make to improve your health and wellness and to prevent falls. This information is not intended to replace advice given to you by your health care provider. Make sure you discuss any questions you have with your health care provider. Document Released: 09/15/2017 Document Revised: 02/23/2019 Document Reviewed: 09/15/2017 Elsevier Patient Education  2020 Elsevier Inc.  

## 2019-10-13 LAB — CMP14+EGFR
ALT: 27 IU/L (ref 0–32)
AST: 22 IU/L (ref 0–40)
Albumin/Globulin Ratio: 1.7 (ref 1.2–2.2)
Albumin: 4.3 g/dL (ref 3.7–4.7)
Alkaline Phosphatase: 75 IU/L (ref 39–117)
BUN/Creatinine Ratio: 12 (ref 12–28)
BUN: 13 mg/dL (ref 8–27)
Bilirubin Total: 0.4 mg/dL (ref 0.0–1.2)
CO2: 22 mmol/L (ref 20–29)
Calcium: 9.6 mg/dL (ref 8.7–10.3)
Chloride: 104 mmol/L (ref 96–106)
Creatinine, Ser: 1.07 mg/dL — ABNORMAL HIGH (ref 0.57–1.00)
GFR calc Af Amer: 60 mL/min/{1.73_m2} (ref >59)
GFR calc non Af Amer: 52 mL/min/{1.73_m2} — ABNORMAL LOW (ref >59)
Globulin, Total: 2.5 g/dL (ref 1.5–4.5)
Glucose: 103 mg/dL — ABNORMAL HIGH (ref 65–99)
Potassium: 4.5 mmol/L (ref 3.5–5.2)
Sodium: 142 mmol/L (ref 134–144)
Total Protein: 6.8 g/dL (ref 6.0–8.5)

## 2019-10-13 LAB — TSH: TSH: 1.25 u[IU]/mL (ref 0.450–4.500)

## 2019-10-13 LAB — T3: T3, Total: 104 ng/dL (ref 71–180)

## 2019-10-13 LAB — VITAMIN D 1,25 DIHYDROXY
Vitamin D 1, 25 (OH)2 Total: 38 pg/mL
Vitamin D2 1, 25 (OH)2: <10 pg/mL
Vitamin D3 1, 25 (OH)2: 38 pg/mL

## 2019-10-13 LAB — T4, FREE: Free T4: 1.42 ng/dL (ref 0.82–1.77)

## 2019-10-13 LAB — HEMOGLOBIN A1C
Est. average glucose Bld gHb Est-mCnc: 134 mg/dL
Hgb A1c MFr Bld: 6.3 % — ABNORMAL HIGH (ref 4.8–5.6)

## 2019-10-15 ENCOUNTER — Other Ambulatory Visit: Payer: Self-pay | Admitting: Nurse Practitioner

## 2019-10-15 DIAGNOSIS — L0292 Furuncle, unspecified: Secondary | ICD-10-CM

## 2019-10-16 ENCOUNTER — Other Ambulatory Visit: Payer: Self-pay | Admitting: Nurse Practitioner

## 2019-10-16 ENCOUNTER — Other Ambulatory Visit: Payer: Self-pay

## 2019-10-17 ENCOUNTER — Other Ambulatory Visit: Payer: Self-pay

## 2019-10-18 ENCOUNTER — Telehealth: Payer: Self-pay

## 2019-10-19 ENCOUNTER — Ambulatory Visit (INDEPENDENT_AMBULATORY_CARE_PROVIDER_SITE_OTHER): Payer: Medicare Other | Admitting: Pharmacist

## 2019-10-19 ENCOUNTER — Other Ambulatory Visit: Payer: Self-pay | Admitting: Pharmacy Technician

## 2019-10-19 ENCOUNTER — Other Ambulatory Visit: Payer: Self-pay

## 2019-10-19 DIAGNOSIS — E119 Type 2 diabetes mellitus without complications: Secondary | ICD-10-CM | POA: Diagnosis not present

## 2019-10-19 DIAGNOSIS — I10 Essential (primary) hypertension: Secondary | ICD-10-CM | POA: Diagnosis not present

## 2019-10-19 MED ORDER — HYDROCORTISONE (PERIANAL) 2.5 % EX CREA: 1.0000 "application " | TOPICAL_CREAM | Freq: Two times a day (BID) | CUTANEOUS | 2 refills | Status: DC

## 2019-10-19 NOTE — Progress Notes (Signed)
Chronic Care Management   Visit Note  10/19/2019 Name: Catherine [name] MRN: [id number] DOB: 1946-05-31  Referred by: Catherine Brine, FNP Reason for referral : Chronic Care Management   Catherine [name] is a 73 y.o. year old female who is a primary care patient of Catherine [name], [place]. The CCM team was consulted for assistance with chronic disease management and care coordination needs related to DMII  Review of patient status, including review of consultants reports, relevant laboratory and other test results, and collaboration with appropriate care team members and the patient's provider was performed as part of comprehensive patient evaluation and provision of chronic care management services.    I spoke with Catherine [name] by telephone today.  Medications: Outpatient Encounter Medications as of 10/19/2019  Medication Sig Note  . Accu-Chek FastClix Lancets MISC AS DIRECTED TWICE A DAY SUBCUTANEOUS 90 DAYS   . Ascorbic Acid (VITAMIN C) 1000 MG tablet Take 1,000 mg by mouth daily.   Catherine [name] aspirin 81 MG tablet Take 81 mg by mouth daily.   Catherine [name] atorvastatin (LIPITOR) 10 MG tablet TAKE 1 TABLET BY MOUTH EVERY DAY   . beta carotene w/minerals (OCUVITE) tablet Take 1 tablet by mouth daily. 08/17/2018: Uses preservision  . calcium citrate-vitamin D (CITRACAL+D) 315-200 MG-UNIT tablet Take 1 tablet by mouth 2 (two) times daily.   . Cyanocobalamin (VITAMIN B 12 PO) Take 1,000 mg by mouth daily.   . Dulaglutide (TRULICITY) 3.08 MV/7.8IO SOPN Inject into the skin. Inject 0.75mg  weekly   . ferrous sulfate 325 (65 FE) MG tablet TAKE 1 TABLET BY MOUTH TWICE A DAY   . glimepiride (AMARYL) 4 MG tablet TAKE 1 TABLET BY MOUTH TWICE A DAY   . Insulin Pen Needle (PEN NEEDLES) 32G X 4 MM MISC 1 each by Does not apply route once a week. Use as directed once per week with Ozempic   . levothyroxine (SYNTHROID) 112 MCG tablet TAKE 1 TABLET (112 MCG TOTAL) BY MOUTH DAILY BEFORE BREAKFAST.   Catherine [name] lisinopril (ZESTRIL) 10 MG  tablet TAKE 1 TABLET BY MOUTH EVERY DAY (Patient taking differently: Take 5 mg by mouth daily. Take 1/2 tablet)   . Multiple Vitamin (MULTIVITAMIN WITH MINERALS) TABS tablet Take 1 tablet by mouth daily.   . mupirocin ointment (BACTROBAN) 2 % APPLY TO AFFECTED AREA TWICE A DAY   . Omega-3 Fatty Acids (FISH OIL) 1000 MG CAPS Take by mouth.   Catherine [name] PROCTOSOL HC 2.5 % rectal cream APPLY RECTALLY 4 TIMES A DAY FOR HEMORRHOIDS   . Wheat Dextrin (BENEFIBER DRINK MIX PO) Take by mouth.    No facility-administered encounter medications on file as of 10/19/2019.      Objective:   Goals Addressed            This Visit's Progress     Patient Stated   . I would like to control my diabetes and optimize my medication management of my chronic conditions (pt-stated)       Current Barriers:  . Diabetes: T2DM; most recent A1c 6.3% on 11/19 (A1c 6.6% on 6/24, 8.3% on 3/23)--improved diet per patient report  . Current antihyperglycemic regimen: Trulicity 0.75mg  weekly, glimepiride 4mg  (work to optimize/potentially d/c glimepiride depending on patient duration of therapy on a sulfonylurea) o Patient continues on Trulicity 0.75mg .  She states she likes this medication and she prefers this injection over Ozempic due to ease of injection/less preparation. o Medication has been delivered to patient's home via [employer] Patient assistance program.  Will  reapply for 2021 o Patient denies hypoglycemia.  Discussed Bgs<70 and how to treat.  Encouraged patient to call PCP office if Bgs fall below 70. Catherine [name] denies hypoglycemic symptoms; denies hyperglycemic symptoms . Current meal patterns: o Patient adhering to limiting carbohydrate intake o Decreased sugary drink intake o Increased water intake . Current exercise: walking as able . Current blood glucose readings: FBG: 89, 90, always <100 per patient report . Cardiovascular risk reduction: o Current hypertensive regimen: lisinopril 10mg  daily o Current hyperlipidemia  regimen: atorvastatin 10mg  daily  Pharmacist Clinical Goal(s):  Over the next 90 days, patient with work with PharmD and primary care provider to address needs related to optimized medication management of chronic conditions.  Interventions: . Comprehensive medication review performed, medication list updated in electronic medical record . Reviewed & discussed the following diabetes-related information with patient: o Continue checking blood sugars as directed o Follow ADA recommended "diabetes-friendly" diet  (reviewed healthy snack/food options) o Discussed GLP-1 injection technique; Patient uses AccuCheck Guide glucometer o Reviewed medication purpose/side effects-->patient denies adverse events o Continue taking all medications as prescribed by provider  Patient Self Care Activities:  . Patient will check blood glucose daily , document, and provide at future appointments . Patient will focus on medication adherence by taking medications as prescribed . Patient will take medications as prescribed . Patient will contact provider with any episodes of hypoglycemia . Patient will report any questions or concerns to provider   Please see past updates related to this goal by clicking on the "Past Updates" button in the selected goal          Plan:   The care management team will reach out to the patient again over the next 30 days.   Provider Signature , PharmD, BCPS Clinical Pharmacist, [facility]  [facility]  Direct Dial: [phone]

## 2019-10-19 NOTE — Patient Outreach (Signed)
[facility]) Care Management  10/19/2019  Catherine [name] Apr 08, 1946 [id number]                                       Medication Assistance Referral  Referral From: [facility] Embedded RPh Jenne Pane.   Medication/Company: Danelle Berry / [employer] Patient application portion:  Mailed Provider application portion: Faxed  to J. Laurance Flatten, DNP, FNP-BC Provider address/fax verified via: Office website   Follow up:  Will follow up with patient in 10-14 business days to confirm application(s) have been received.  [name] [name] [place] Certified Pharmacy Technician [place] Management Direct Dial:[phone]

## 2019-10-19 NOTE — Patient Instructions (Signed)
Visit Information  Goals Addressed            This Visit's Progress     Patient Stated   . I would like to control my diabetes and optimize my medication management of my chronic conditions (pt-stated)       Current Barriers:  . Diabetes: T2DM; most recent A1c 6.3% on 11/19 (A1c 6.6% on 6/24, 8.3% on 3/23)--improved diet per patient report  . Current antihyperglycemic regimen: Trulicity 0.75mg  weekly, glimepiride 4mg  (work to optimize/potentially d/c glimepiride depending on patient duration of therapy on a sulfonylurea) o Patient continues on Trulicity 0.75mg .  She states she likes this medication and she prefers this injection over Ozempic due to ease of injection/less preparation. o Medication has been delivered to patient's home via [employer] Patient assistance program.  Will reapply for 2021 o Patient denies hypoglycemia.  Discussed Bgs<70 and how to treat.  Encouraged patient to call PCP office if Bgs fall below 70. [name] Kitchen denies hypoglycemic symptoms; denies hyperglycemic symptoms . Current meal patterns: o Patient adhering to limiting carbohydrate intake o Decreased sugary drink intake o Increased water intake . Current exercise: walking as able . Current blood glucose readings: FBG: 89, 90, always <100 per patient report . Cardiovascular risk reduction: o Current hypertensive regimen: lisinopril 10mg  daily o Current hyperlipidemia regimen: atorvastatin 10mg  daily  Pharmacist Clinical Goal(s):  [name] Kitchen Over the next 90 days, patient with work with PharmD and primary care provider to address needs related to optimized medication management of chronic conditions.  Interventions: . Comprehensive medication review performed, medication list updated in electronic medical record . Reviewed & discussed the following diabetes-related information with patient: o Continue checking blood sugars as directed o Follow ADA recommended "diabetes-friendly" diet  (reviewed healthy snack/food  options) o Discussed GLP-1 injection technique; Patient uses AccuCheck Guide glucometer o Reviewed medication purpose/side effects-->patient denies adverse events o Continue taking all medications as prescribed by provider  Patient Self Care Activities:  . Patient will check blood glucose daily , document, and provide at future appointments . Patient will focus on medication adherence by taking medications as prescribed . Patient will take medications as prescribed . Patient will contact provider with any episodes of hypoglycemia . Patient will report any questions or concerns to provider   Please see past updates related to this goal by clicking on the "Past Updates" button in the selected goal         The patient verbalized understanding of instructions provided today and declined a print copy of patient instruction materials.   The care management team will reach out to the patient again over the next 30 days.   SIGNATURE Regina Eck, PharmD, BCPS Clinical Pharmacist, [facility] [place]: [phone]

## 2019-10-23 ENCOUNTER — Telehealth: Payer: Self-pay

## 2019-10-23 ENCOUNTER — Other Ambulatory Visit: Payer: Self-pay

## 2019-10-23 MED ORDER — ACCU-CHEK FASTCLIX LANCETS MISC: 4 refills | Status: DC

## 2019-10-23 NOTE — Telephone Encounter (Signed)
Pt left vm stating that the pharmacy was having trouble filling her Accu check strips. Pt stated they spoke with Yamika (CMA) last wk but order wasn't completed. I put in a refill, also sent a note to [facility]

## 2019-11-03 ENCOUNTER — Other Ambulatory Visit: Payer: Self-pay | Admitting: Nurse Practitioner

## 2019-11-03 DIAGNOSIS — I10 Essential (primary) hypertension: Secondary | ICD-10-CM

## 2019-11-06 ENCOUNTER — Other Ambulatory Visit: Payer: Self-pay

## 2019-11-06 ENCOUNTER — Other Ambulatory Visit: Payer: Self-pay | Admitting: Pharmacy Technician

## 2019-11-06 MED ORDER — ACCU-CHEK FASTCLIX LANCETS MISC: 4 refills | Status: DC

## 2019-11-06 NOTE — Patient Outreach (Signed)
[place] [facility]) Care Management  11/06/2019  Catherine [name] 03-Mar-1946 [id number]   Received patient portion(s) of patient assistance application(s) for Trulicity. Faxed completed application and required documents into [employer].  Will follow up with company(ies) in 7-10 business days to check status of application(s).  [name] [name] [place] Certified Pharmacy Technician [place] Management Direct [phone]

## 2019-11-15 ENCOUNTER — Other Ambulatory Visit: Payer: Self-pay | Admitting: Pharmacy Technician

## 2019-11-15 NOTE — Patient Outreach (Addendum)
[name] [facility]) Care Management  11/15/2019  Catherine [name] 15-Aug-1946 [id number]   Informed by [name] that patient has been re-enrolled for Trulicity thru [employer] patient assistance starting 11/17/2019 until 11/15/2020.  Will remove myself from care team.  [name]. [name] [place] Certified Pharmacy Technician [place] Management Direct Dial:[phone]

## 2019-11-16 ENCOUNTER — Other Ambulatory Visit: Payer: Self-pay | Admitting: Nurse Practitioner

## 2019-11-29 ENCOUNTER — Other Ambulatory Visit: Payer: Self-pay

## 2019-11-29 ENCOUNTER — Ambulatory Visit (INDEPENDENT_AMBULATORY_CARE_PROVIDER_SITE_OTHER): Payer: Medicare Other | Admitting: Pharmacist

## 2019-11-29 ENCOUNTER — Other Ambulatory Visit: Payer: Self-pay | Admitting: Nurse Practitioner

## 2019-11-29 ENCOUNTER — Telehealth: Payer: Federal, State, Local not specified - PPO | Admitting: Pharmacist

## 2019-11-29 ENCOUNTER — Telehealth: Payer: Self-pay

## 2019-11-29 DIAGNOSIS — E119 Type 2 diabetes mellitus without complications: Secondary | ICD-10-CM

## 2019-11-29 DIAGNOSIS — I10 Essential (primary) hypertension: Secondary | ICD-10-CM | POA: Diagnosis not present

## 2019-11-29 MED ORDER — ATORVASTATIN CALCIUM 10 MG PO TABS: 10.0000 mg | ORAL_TABLET | Freq: Every day | ORAL | 0 refills | Status: DC

## 2019-12-01 NOTE — Patient Instructions (Signed)
Visit Information  Goals Addressed            This Visit's Progress     Patient Stated   . I would like to control my diabetes and optimize my medication management of my chronic conditions (pt-stated)       Current Barriers:  . Diabetes: T2DM; most recent A1c 6.3% on 11/19 (A1c 6.6% on 6/24, 8.3% on 3/23)--improved diet per patient report  . Current antihyperglycemic regimen: Trulicity 0.75mg  weekly, glimepiride 4mg  (work to optimize/potentially d/c glimepiride depending on patient duration of therapy on a sulfonylurea) o Patient continues on Trulicity 0.75mg .  She states she likes this medication and she prefers this injection over Ozempic due to ease of injection/less preparation. o Patient assistance program.  Will reapply for 2021 o Patient denies hypoglycemia.  Discussed Bgs<70 and how to treat.  Encouraged patient to call PCP office if Bgs fall below 70. 2022 Denies hypoglycemic symptoms; denies hyperglycemic symptoms . Current meal patterns: o Patient adhering to limiting carbohydrate intake o Decreased sugary drink intake o Increased water intake . Current exercise: walking as able . Current blood glucose readings: FBG: always <100 per patient report . Cardiovascular risk reduction: o Current hypertensive regimen: lisinopril 10mg  daily o Current hyperlipidemia regimen: atorvastatin 10mg  daily  Pharmacist Clinical Goal(s):  [name] Kitchen Over the next 90 days, patient with work with PharmD and primary care provider to address needs related to optimized medication management of chronic conditions.  Interventions: . Comprehensive medication review performed, medication list updated in electronic medical record . Reviewed & discussed the following diabetes-related information with patient: Follow ADA recommended "diabetes-friendly" diet  (reviewed healthy snack/food options) . Discussed GLP-1 injection technique; Patient uses AccuCheck Guide glucometer . Reviewed medication  purpose/side effects-->patient denies adverse events   Patient Self Care Activities:  . Patient will check blood glucose daily , document, and provide at future appointments . Patient will focus on medication adherence by taking medications as prescribed . Patient will take medications as prescribed . Patient will contact provider with any episodes of hypoglycemia . Patient will report any questions or concerns to provider   Please see past updates related to this goal by clicking on the "Past Updates" button in the selected goal         The patient verbalized understanding of instructions provided today and declined a print copy of patient instruction materials.   The care management team will reach out to the patient again over the next 60 days.   SIGNATURE , PharmD, BCPS Clinical Pharmacist, [facility]  [facility]  Direct Dial: [phone]

## 2019-12-01 NOTE — Progress Notes (Signed)
Chronic Care Management   Visit Note  11/29/2019 Name: Catherine [name] MRN: [id number] DOB: 08/17/1946  Referred by: Catherine Brine, FNP Reason for referral : Chronic Care Management   Catherine [name] is a 74 y.o. year old female who is a primary care patient of Catherine [name], [place]. The CCM team was consulted for assistance with chronic disease management and care coordination needs related to DMII  Review of patient status, including review of consultants reports, relevant laboratory and other test results, and collaboration with appropriate care team members and the patient's provider was performed as part of comprehensive patient evaluation and provision of chronic care management services.    I spoke with Catherine [name] by telephone today  Medications: Outpatient Encounter Medications as of 11/29/2019  Medication Sig Note  . Accu-Chek FastClix Lancets MISC AS DIRECTED TWICE A DAY SUBCUTANEOUS 90 DAYS E11.9   . Ascorbic Acid (VITAMIN C) 1000 MG tablet Take 1,000 mg by mouth daily.   [name] Kitchen aspirin 81 MG tablet Take 81 mg by mouth daily.   . beta carotene w/minerals (OCUVITE) tablet Take 1 tablet by mouth daily. 08/17/2018: Uses preservision  . calcium citrate-vitamin D (CITRACAL+D) 315-200 MG-UNIT tablet Take 1 tablet by mouth 2 (two) times daily.   . Cyanocobalamin (VITAMIN B 12 PO) Take 1,000 mg by mouth daily.   . Dulaglutide (TRULICITY) 4.09 WJ/1.9JY SOPN Inject into the skin. Inject 0.75mg  weekly   . ferrous sulfate 325 (65 FE) MG tablet TAKE 1 TABLET BY MOUTH TWICE A DAY   . glimepiride (AMARYL) 4 MG tablet TAKE 1 TABLET BY MOUTH TWICE A DAY   . hydrocortisone (PROCTOSOL HC) 2.5 % rectal cream Place 1 application rectally 2 (two) times daily.   . Insulin Pen Needle (PEN NEEDLES) 32G X 4 MM MISC 1 each by Does not apply route once a week. Use as directed once per week with Ozempic   . levothyroxine (SYNTHROID) 112 MCG tablet TAKE 1 TABLET (112 MCG TOTAL) BY MOUTH DAILY BEFORE BREAKFAST.    [name] Kitchen lisinopril (ZESTRIL) 10 MG tablet TAKE 1 TABLET BY MOUTH EVERY DAY   . Multiple Vitamin (MULTIVITAMIN WITH MINERALS) TABS tablet Take 1 tablet by mouth daily.   . mupirocin ointment (BACTROBAN) 2 % APPLY TO AFFECTED AREA TWICE A DAY   . Omega-3 Fatty Acids (FISH OIL) 1000 MG CAPS Take by mouth.   [name] Kitchen PROCTOSOL HC 2.5 % rectal cream APPLY RECTALLY 4 TIMES A DAY FOR HEMORRHOIDS   . Wheat Dextrin (BENEFIBER DRINK MIX PO) Take by mouth.   . [DISCONTINUED] atorvastatin (LIPITOR) 10 MG tablet TAKE 1 TABLET BY MOUTH EVERY DAY    No facility-administered encounter medications on file as of 11/29/2019.     Objective:   Goals Addressed            This Visit's Progress     Patient Stated   . I would like to control my diabetes and optimize my medication management of my chronic conditions (pt-stated)       Current Barriers:  . Diabetes: T2DM; most recent A1c 6.3% on 11/19 (A1c 6.6% on 6/24, 8.3% on 3/23)--improved diet per patient report  . Current antihyperglycemic regimen: Trulicity 0.75mg  weekly, glimepiride 4mg  (work to optimize/potentially d/c glimepiride depending on patient duration of therapy on a sulfonylurea) o Patient continues on Trulicity 0.75mg .  She states she likes this medication and she prefers this injection over Ozempic due to ease of injection/less preparation. o [employer] Patient assistance program.  Will reapply  for 2021 o Patient denies hypoglycemia.  Discussed Bgs<70 and how to treat.  Encouraged patient to call PCP office if Bgs fall below 70. [name] Kitchen Denies hypoglycemic symptoms; denies hyperglycemic symptoms . Current meal patterns: o Patient adhering to limiting carbohydrate intake o Decreased sugary drink intake o Increased water intake . Current exercise: walking as able . Current blood glucose readings: FBG: always <100 per patient report . Cardiovascular risk reduction: o Current hypertensive regimen: lisinopril 10mg  daily o Current hyperlipidemia regimen:  atorvastatin 10mg  daily (surplus supply, haven't filled this year, will follow)  Pharmacist Clinical Goal(s):  Over the next 90 days, patient with work with PharmD and primary care provider to address needs related to optimized medication management of chronic conditions.  Interventions: . Comprehensive medication review performed, medication list updated in electronic medical record . Reviewed & discussed the following diabetes-related information with patient: Follow ADA recommended "diabetes-friendly" diet  (reviewed healthy snack/food options) . Discussed GLP-1 injection technique; Patient uses AccuCheck Guide glucometer . Reviewed medication purpose/side effects-->patient denies adverse events   Patient Self Care Activities:  . Patient will check blood glucose daily , document, and provide at future appointments . Patient will focus on medication adherence by taking medications as prescribed . Patient will take medications as prescribed . Patient will contact provider with any episodes of hypoglycemia . Patient will report any questions or concerns to provider   Please see past updates related to this goal by clicking on the "Past Updates" button in the selected goal         Plan:   The care management team will reach out to the patient again over the next 60 days.   Provider Signature , PharmD, BCPS Clinical Pharmacist, [facility]  [facility]  Direct Dial: [phone]

## 2019-12-13 ENCOUNTER — Ambulatory Visit: Payer: Self-pay | Admitting: Pharmacist

## 2019-12-13 DIAGNOSIS — E559 Vitamin D deficiency, unspecified: Secondary | ICD-10-CM

## 2019-12-13 DIAGNOSIS — I10 Essential (primary) hypertension: Secondary | ICD-10-CM | POA: Diagnosis not present

## 2019-12-13 DIAGNOSIS — E119 Type 2 diabetes mellitus without complications: Secondary | ICD-10-CM | POA: Diagnosis not present

## 2019-12-13 NOTE — Progress Notes (Signed)
Chronic Care Management   Visit Note  12/13/2019 Name: Catherine [name] MRN: [id number] DOB: 10-01-1946  Referred by: [name], FNP Reason for referral : Chronic Care Management   Catherine [name] is a 74 y.o. year old female who is a primary care patient of [name], [place]. The CCM team was consulted for assistance with chronic disease management and care coordination needs related to DMII  Review of patient status, including review of consultants reports, relevant laboratory and other test results, and collaboration with appropriate care team members and the patient's provider was performed as part of comprehensive patient evaluation and provision of chronic care management services.    Patient was contacted via telephone for today's visit regarding her diabetes and chronic care management   Medications: Outpatient Encounter Medications as of 12/13/2019  Medication Sig Note  . Accu-Chek FastClix Lancets MISC AS DIRECTED TWICE A DAY SUBCUTANEOUS 90 DAYS E11.9   . Ascorbic Acid (VITAMIN C) 1000 MG tablet Take 1,000 mg by mouth daily.   [name] Kitchen aspirin 81 MG tablet Take 81 mg by mouth daily.   [name] Kitchen atorvastatin (LIPITOR) 10 MG tablet Take 1 tablet (10 mg total) by mouth daily.   . beta carotene w/minerals (OCUVITE) tablet Take 1 tablet by mouth daily. 08/17/2018: Uses preservision  . calcium citrate-vitamin D (CITRACAL+D) 315-200 MG-UNIT tablet Take 1 tablet by mouth 2 (two) times daily.   . Cyanocobalamin (VITAMIN B 12 PO) Take 1,000 mg by mouth daily.   . Dulaglutide (TRULICITY) 4.09 WJ/1.9JY SOPN Inject into the skin. Inject 0.75mg  weekly   . ferrous sulfate 325 (65 FE) MG tablet TAKE 1 TABLET BY MOUTH TWICE A DAY   . glimepiride (AMARYL) 4 MG tablet TAKE 1 TABLET BY MOUTH TWICE A DAY   . hydrocortisone (PROCTOSOL HC) 2.5 % rectal cream Place 1 application rectally 2 (two) times daily.   . Insulin Pen Needle (PEN NEEDLES) 32G X 4 MM MISC 1 each by Does not apply route once a week. Use  as directed once per week with Ozempic   . levothyroxine (SYNTHROID) 112 MCG tablet TAKE 1 TABLET (112 MCG TOTAL) BY MOUTH DAILY BEFORE BREAKFAST.   [name] Kitchen lisinopril (ZESTRIL) 10 MG tablet TAKE 1 TABLET BY MOUTH EVERY DAY   . Multiple Vitamin (MULTIVITAMIN WITH MINERALS) TABS tablet Take 1 tablet by mouth daily.   . mupirocin ointment (BACTROBAN) 2 % APPLY TO AFFECTED AREA TWICE A DAY   . Omega-3 Fatty Acids (FISH OIL) 1000 MG CAPS Take by mouth.   [name] Kitchen PROCTOSOL HC 2.5 % rectal cream APPLY RECTALLY 4 TIMES A DAY FOR HEMORRHOIDS   . Wheat Dextrin (BENEFIBER DRINK MIX PO) Take by mouth.    No facility-administered encounter medications on file as of 12/13/2019.     Objective:   Goals Addressed            This Visit's Progress     Patient Stated   . I would like to control my diabetes and optimize my medication management of my chronic conditions (pt-stated)       Current Barriers:  . Diabetes: T2DM; most recent A1c 6.3% on 11/19 (A1c 6.6% on 6/24, 8.3% on 3/23)--improved diet per patient report  . Current antihyperglycemic regimen: Trulicity 0.75mg  weekly, glimepiride 4mg  (work to optimize/potentially d/c glimepiride depending on patient duration of therapy on a sulfonylurea) o Patient continues on Trulicity 0.75mg .  She states she likes this medication and she prefers this injection over Ozempic due to ease of injection/less preparation.  o [employer] Patient assistance program-approved until 11/15/20-instructed pt to call in refills o Patient denies hypoglycemia.  Discussed Bgs<70 and how to treat.  Encouraged patient to call PCP office if Bgs fall below 70. o Patient reports FBGs 180-200, she states she has started drinking V8 mixed berry juice - Instructed patient stop juice, increase water intake - Alternatively we can increase Trulicity . Denies hypoglycemic symptoms; denies hyperglycemic symptoms . Current meal patterns: o Patient adhering to limiting carbohydrate intake o Decreased  sugary drink intake o Increased water intake . Current exercise: walking as able . Current blood glucose readings: FBG: always <100 per patient report . Cardiovascular risk reduction: o Current hypertensive regimen: lisinopril 10mg  daily o Current hyperlipidemia regimen: atorvastatin 10mg  daily (surplus supply, haven't filled this year, will follow)  Pharmacist Clinical Goal(s):  Over the next 90 days, patient with work with PharmD and primary care provider to address needs related to optimized medication management of chronic conditions.  Interventions: . Comprehensive medication review performed, medication list updated in electronic medical record . Reviewed & discussed the following diabetes-related information with patient: Follow ADA recommended "diabetes-friendly" diet  (reviewed healthy snack/food options) . Discussed GLP-1 injection technique; Patient uses AccuCheck Guide glucometer . Reviewed medication purpose/side effects-->patient denies adverse events . Discussed calcium/vit D with PCP, .  Patient instructed to only continue Caltrate+D BID, stop additional calcium and vit D.  She reports constipation.  This will improve condition.   Patient Self Care Activities:  . Patient will check blood glucose daily , document, and provide at future appointments . Patient will focus on medication adherence by taking medications as prescribed . Patient will take medications as prescribed . Patient will contact provider with any episodes of hypoglycemia . Patient will report any questions or concerns to provider   Please see past updates related to this goal by clicking on the "Past Updates" button in the selected goal          Plan:   The care management team will reach out to the patient again over the next 30 days.   Provider Signature  [name] Kitchen, PharmD, BCPS Clinical Pharmacist, [facility]  [facility]  Direct Dial: [phone]

## 2019-12-14 NOTE — Patient Instructions (Signed)
Visit Information  Goals Addressed            This Visit's Progress     Patient Stated   . I would like to control my diabetes and optimize my medication management of my chronic conditions (pt-stated)       Current Barriers:  . Diabetes: T2DM; most recent A1c 6.3% on 11/19 (A1c 6.6% on 6/24, 8.3% on 3/23)--improved diet per patient report  . Current antihyperglycemic regimen: Trulicity 0.75mg  weekly, glimepiride 4mg  (work to optimize/potentially d/c glimepiride depending on patient duration of therapy on a sulfonylurea) o Patient continues on Trulicity 0.75mg .  She states she likes this medication and she prefers this injection over Ozempic due to ease of injection/less preparation. o [employer] Patient assistance program-approved until 11/15/20-instructed pt to call in refills o Patient denies hypoglycemia.  Discussed Bgs<70 and how to treat.  Encouraged patient to call PCP office if Bgs fall below 70. o Patient reports FBGs 180-200, she states she has started drinking V8 mixed berry juice - Instructed patient stop juice, increase water intake - Alternatively we can increase Trulicity . Denies hypoglycemic symptoms; denies hyperglycemic symptoms . Current meal patterns: o Patient adhering to limiting carbohydrate intake o Decreased sugary drink intake o Increased water intake . Current exercise: walking as able . Current blood glucose readings: FBG: always <100 per patient report . Cardiovascular risk reduction: o Current hypertensive regimen: lisinopril 10mg  daily o Current hyperlipidemia regimen: atorvastatin 10mg  daily (surplus supply, haven't filled this year, will follow)  Pharmacist Clinical Goal(s):  11/17/20 Over the next 90 days, patient with work with PharmD and primary care provider to address needs related to optimized medication management of chronic conditions.  Interventions: . Comprehensive medication review performed, medication list updated in electronic medical  record . Reviewed & discussed the following diabetes-related information with patient: Follow ADA recommended "diabetes-friendly" diet  (reviewed healthy snack/food options) . Discussed GLP-1 injection technique; Patient uses AccuCheck Guide glucometer . Reviewed medication purpose/side effects-->patient denies adverse events . Discussed calcium/vit D with PCP, .  Patient instructed to only continue Caltrate+D BID, stop additional calcium and vit D.  She reports constipation.  This will improve condition.   Patient Self Care Activities:  . Patient will check blood glucose daily , document, and provide at future appointments . Patient will focus on medication adherence by taking medications as prescribed . Patient will take medications as prescribed . Patient will contact provider with any episodes of hypoglycemia . Patient will report any questions or concerns to provider   Please see past updates related to this goal by clicking on the "Past Updates" button in the selected goal         The patient verbalized understanding of instructions provided today and declined a print copy of patient instruction materials.   The care management team will reach out to the patient again over the next 30 days.   SIGNATURE , PharmD, BCPS Clinical Pharmacist, [facility]  [facility]  Direct Dial: [phone]

## 2019-12-21 ENCOUNTER — Ambulatory Visit (INDEPENDENT_AMBULATORY_CARE_PROVIDER_SITE_OTHER): Payer: Medicare Other | Admitting: Pharmacist

## 2019-12-21 DIAGNOSIS — E119 Type 2 diabetes mellitus without complications: Secondary | ICD-10-CM | POA: Diagnosis not present

## 2019-12-22 NOTE — Patient Instructions (Signed)
Visit Information  Goals Addressed            This Visit's Progress     Patient Stated   . I would like to control my diabetes and optimize my medication management of my chronic conditions (pt-stated)       Current Barriers:  . Diabetes: T2DM; most recent A1c 6.3% on 11/19 (A1c 6.6% on 6/24, 8.3% on 3/23)--improved diet per patient report  . Current antihyperglycemic regimen: Trulicity 0.75mg  weekly, glimepiride 4mg  (work to optimize/potentially d/c glimepiride depending on patient duration of therapy on a sulfonylurea) o Patient continues on Trulicity 0.75mg .  She states she likes this medication and she prefers this injection over Ozempic due to ease of injection/less preparation. o [employer] Patient assistance program-approved until 11/15/20-instructed pt to call in refills - 12/21/19: Refills delayed--3 way call performed with [facility], shipment set up; pt has 2 weeks of Trulicity left at home. o Patient denies hypoglycemia.  Discussed Bgs<70 and how to treat.  Encouraged patient to call PCP office if Bgs fall below 70. o Patient reports FBGs 180-200, she states she has started drinking V8 mixed berry juice - Instructed patient stop juice, increase water intake - Alternatively we can increase Trulicity . Denies hypoglycemic symptoms; denies hyperglycemic symptoms . Current meal patterns: o Patient adhering to limiting carbohydrate intake o Decreased sugary drink intake o Increased water intake . Current exercise: walking as able . Current blood glucose readings: FBG: always <100 per patient report . Cardiovascular risk reduction: o Current hypertensive regimen: lisinopril 10mg  daily o Current hyperlipidemia regimen: atorvastatin 10mg  daily (surplus supply, haven't filled this year, will follow)  Pharmacist Clinical Goal(s):  [facility] Over the next 90 days, patient with work with PharmD and primary care provider to address needs related to optimized medication management of chronic  conditions.  Interventions: . Comprehensive medication review performed, medication list updated in electronic medical record . Reviewed & discussed the following diabetes-related information with patient: Follow ADA recommended "diabetes-friendly" diet  (reviewed healthy snack/food options) . Discussed GLP-1 injection technique; Patient uses AccuCheck Guide glucometer . Reviewed medication purpose/side effects-->patient denies adverse events . Discussed calcium/vit D with PCP, .  Patient instructed to only continue Caltrate+D BID, stop additional calcium and vit D.  She reports constipation.  This will improve condition.   Patient Self Care Activities:  . Patient will check blood glucose daily , document, and provide at future appointments . Patient will focus on medication adherence by taking medications as prescribed . Patient will take medications as prescribed . Patient will contact provider with any episodes of hypoglycemia . Patient will report any questions or concerns to provider   Please see past updates related to this goal by clicking on the "Past Updates" button in the selected goal         The patient verbalized understanding of instructions provided today and declined a print copy of patient instruction materials.   The care management team will reach out to the patient again over the next 30 days.   SIGNATURE , PharmD, BCPS Clinical Pharmacist, [facility]  [facility]  Direct Dial: [phone]

## 2019-12-22 NOTE — Progress Notes (Signed)
Chronic Care Management   Visit Note  12/21/2019 Name: Catherine [name] MRN: [id number] DOB: 1946-07-06  Referred by: [name], FNP Reason for referral : Chronic Care Management (Diabetes)   Catherine [name] is a 74 y.o. year old female who is a primary care patient of [name], FNP. The CCM team was consulted for assistance with chronic disease management and care coordination needs related to DMII  Review of patient status, including review of consultants reports, relevant laboratory and other test results, and collaboration with appropriate care team members and the patient's provider was performed as part of comprehensive patient evaluation and provision of chronic care management services.    Patient was contacted via telephone for today's visit regarding her diabetes and chronic care management   Medications: Outpatient Encounter Medications as of 12/21/2019  Medication Sig Note  . Accu-Chek FastClix Lancets MISC AS DIRECTED TWICE A DAY SUBCUTANEOUS 90 DAYS E11.9   . Ascorbic Acid (VITAMIN C) 1000 MG tablet Take 1,000 mg by mouth daily.   [name] Kitchen aspirin 81 MG tablet Take 81 mg by mouth daily.   [name] Kitchen atorvastatin (LIPITOR) 10 MG tablet Take 1 tablet (10 mg total) by mouth daily.   . beta carotene w/minerals (OCUVITE) tablet Take 1 tablet by mouth daily. 08/17/2018: Uses preservision  . calcium citrate-vitamin D (CITRACAL+D) 315-200 MG-UNIT tablet Take 1 tablet by mouth 2 (two) times daily.   . Cyanocobalamin (VITAMIN B 12 PO) Take 1,000 mg by mouth daily.   . Dulaglutide (TRULICITY) 0.75 MG/0.5ML SOPN Inject into the skin. Inject 0.75mg  weekly   . ferrous sulfate 325 (65 FE) MG tablet TAKE 1 TABLET BY MOUTH TWICE A DAY   . glimepiride (AMARYL) 4 MG tablet TAKE 1 TABLET BY MOUTH TWICE A DAY   . hydrocortisone (PROCTOSOL HC) 2.5 % rectal cream Place 1 application rectally 2 (two) times daily.   . Insulin Pen Needle (PEN NEEDLES) 32G X 4 MM MISC 1 each by Does not apply route once a  week. Use as directed once per week with Ozempic   . levothyroxine (SYNTHROID) 112 MCG tablet TAKE 1 TABLET (112 MCG TOTAL) BY MOUTH DAILY BEFORE BREAKFAST.   [name] Kitchen lisinopril (ZESTRIL) 10 MG tablet TAKE 1 TABLET BY MOUTH EVERY DAY   . Multiple Vitamin (MULTIVITAMIN WITH MINERALS) TABS tablet Take 1 tablet by mouth daily.   . mupirocin ointment (BACTROBAN) 2 % APPLY TO AFFECTED AREA TWICE A DAY   . Omega-3 Fatty Acids (FISH OIL) 1000 MG CAPS Take by mouth.   [name] Kitchen PROCTOSOL HC 2.5 % rectal cream APPLY RECTALLY 4 TIMES A DAY FOR HEMORRHOIDS   . Wheat Dextrin (BENEFIBER DRINK MIX PO) Take by mouth.    No facility-administered encounter medications on file as of 12/21/2019.     Objective:   Goals Addressed            This Visit's Progress     Patient Stated   . I would like to control my diabetes and optimize my medication management of my chronic conditions (pt-stated)       Current Barriers:  . Diabetes: T2DM; most recent A1c 6.3% on 11/19 (A1c 6.6% on 6/24, 8.3% on 3/23)--improved diet per patient report  . Current antihyperglycemic regimen: Trulicity 0.75mg  weekly, glimepiride 4mg  (work to optimize/potentially d/c glimepiride depending on patient duration of therapy on a sulfonylurea) o Patient continues on Trulicity 0.75mg .  She states she likes this medication and she prefers this injection over Ozempic due to ease of injection/less  preparation. o [employer] Patient assistance program-approved until 11/15/20-instructed pt to call in refills - 12/21/19: Refills delayed--3 way call performed with [facility], shipment set up; pt has 2 weeks of Trulicity left at home. o Patient denies hypoglycemia.  Discussed Bgs<70 and how to treat.  Encouraged patient to call PCP office if Bgs fall below 70. o Patient reports FBGs 180-200, she states she has started drinking V8 mixed berry juice - Instructed patient stop juice, increase water intake - Alternatively we can increase Trulicity . Denies  hypoglycemic symptoms; denies hyperglycemic symptoms . Current meal patterns: o Patient adhering to limiting carbohydrate intake o Decreased sugary drink intake o Increased water intake . Current exercise: walking as able . Current blood glucose readings: FBG: always <100 per patient report . Cardiovascular risk reduction: o Current hypertensive regimen: lisinopril 10mg  daily o Current hyperlipidemia regimen: atorvastatin 10mg  daily (surplus supply, haven't filled this year, will follow)  Pharmacist Clinical Goal(s):  [name] Kitchen Over the next 90 days, patient with work with PharmD and primary care provider to address needs related to optimized medication management of chronic conditions.  Interventions: . Comprehensive medication review performed, medication list updated in electronic medical record . Reviewed & discussed the following diabetes-related information with patient: Follow ADA recommended "diabetes-friendly" diet  (reviewed healthy snack/food options) . Discussed GLP-1 injection technique; Patient uses AccuCheck Guide glucometer . Reviewed medication purpose/side effects-->patient denies adverse events . Discussed calcium/vit D with PCP, [name].  Patient instructed to only continue Caltrate+D BID, stop additional calcium and vit D.  She reports constipation.  This will improve condition.   Patient Self Care Activities:  . Patient will check blood glucose daily , document, and provide at future appointments . Patient will focus on medication adherence by taking medications as prescribed . Patient will take medications as prescribed . Patient will contact provider with any episodes of hypoglycemia . Patient will report any questions or concerns to provider   Please see past updates related to this goal by clicking on the "Past Updates" button in the selected goal          Plan:   The care management team will reach out to the patient again over the next 30 days.    Provider Signature Regina Eck, PharmD, BCPS Clinical Pharmacist, [facility] [place]: [phone]

## 2020-01-04 ENCOUNTER — Ambulatory Visit: Payer: Medicare Other | Admitting: Nurse Practitioner

## 2020-01-18 ENCOUNTER — Other Ambulatory Visit: Payer: Self-pay

## 2020-01-18 ENCOUNTER — Encounter: Payer: Self-pay | Admitting: Nurse Practitioner

## 2020-01-18 ENCOUNTER — Ambulatory Visit (INDEPENDENT_AMBULATORY_CARE_PROVIDER_SITE_OTHER): Payer: Medicare Other | Admitting: Nurse Practitioner

## 2020-01-18 ENCOUNTER — Ambulatory Visit (INDEPENDENT_AMBULATORY_CARE_PROVIDER_SITE_OTHER): Payer: Medicare Other

## 2020-01-18 VITALS — BP 126/74 | HR 89 | Temp 98.3°F | Ht 65.0 in | Wt 177.0 lb

## 2020-01-18 DIAGNOSIS — E559 Vitamin D deficiency, unspecified: Secondary | ICD-10-CM

## 2020-01-18 DIAGNOSIS — I10 Essential (primary) hypertension: Secondary | ICD-10-CM

## 2020-01-18 DIAGNOSIS — E039 Hypothyroidism, unspecified: Secondary | ICD-10-CM | POA: Diagnosis not present

## 2020-01-18 DIAGNOSIS — E119 Type 2 diabetes mellitus without complications: Secondary | ICD-10-CM | POA: Diagnosis not present

## 2020-01-18 DIAGNOSIS — Z0000 Encounter for general adult medical examination without abnormal findings: Secondary | ICD-10-CM | POA: Diagnosis not present

## 2020-01-18 MED ORDER — LISINOPRIL 2.5 MG PO TABS: 2.5000 mg | ORAL_TABLET | Freq: Every day | ORAL | 1 refills | Status: DC

## 2020-01-18 MED ORDER — LISINOPRIL 2.5 MG PO TABS: 10.0000 mg | ORAL_TABLET | Freq: Every day | ORAL | 1 refills | Status: DC

## 2020-01-18 NOTE — Progress Notes (Signed)
This visit occurred during the SARS-CoV-2 public health emergency.  Safety protocols were in place, including screening questions prior to the visit, additional usage of staff PPE, and extensive cleaning of exam room while observing appropriate contact time as indicated for disinfecting solutions.  Subjective:   Catherine [name] is a 74 y.o. female who presents for Medicare Annual (Subsequent) preventive examination.  Review of Systems:  n/a Cardiac Risk Factors include: advanced age (>75men, >106 women);diabetes mellitus;hypertension;sedentary lifestyle     Objective:     Vitals: BP 126/74 (BP Location: Left Arm, Patient Position: Sitting, Cuff Size: Normal)   Pulse 89   Temp 98.3 F (36.8 C) (Oral)   Ht 5\' 5"  (1.651 m)   Wt 177 lb (80.3 kg)   BMI 29.45 kg/m   Body mass index is 29.45 kg/m.  Advanced Directives 01/18/2020 10/04/2019 08/17/2018 12/21/2017 03/16/2017 08/13/2014  Does Patient Have a Medical Advance Directive? Yes Yes Yes No Yes No  Type of 08/15/2014 of [place];Living will Healthcare Power of [place];Living will - - - -  Does patient want to make changes to medical advance directive? - - - - Yes (MAU/Ambulatory/Procedural Areas - Information given) -  Copy of Healthcare Power of Attorney in Chart? No - copy requested No - copy requested - - - -    Tobacco Social History   Tobacco Use  Smoking Status Never Smoker  Smokeless Tobacco Never Used     Counseling given: Not Answered   Clinical Intake:  Pre-visit preparation completed: Yes  Pain : No/denies pain     Nutritional Status: BMI 25 -29 Overweight Nutritional Risks: None Diabetes: Yes  How often do you need to have someone help you when you read instructions, pamphlets, or other written materials from your doctor or pharmacy?: 1 - Never What is the last grade level you completed in school?: 12th grade  Interpreter Needed?: No  Information entered by :: NAllen LPN  Past  Medical History:  Diagnosis Date  . Diabetes mellitus without complication (HCC)   . Hypertension   . Thyroid disease    Past Surgical History:  Procedure Laterality Date  . CHOLECYSTECTOMY     Family History  Problem Relation Age of Onset  . Diabetes Mellitus II Mother   . Stroke Mother   . Diabetes Mellitus II Sister   . Hypertension Sister    Social History   Socioeconomic History  . Marital status: Married    Spouse name: Not on file  . Number of children: Not on file  . Years of education: Not on file  . Highest education level: Not on file  Occupational History  . Occupation: retired  Tobacco Use  . Smoking status: Never Smoker  . Smokeless tobacco: Never Used  Substance and Sexual Activity  . Alcohol use: No  . Drug use: No  . Sexual activity: Not Currently  Other Topics Concern  . Not on file  Social History Narrative  . Not on file   Social Determinants of Health   Financial Resource Strain: Low Risk   . Difficulty of Paying Living Expenses: Not hard at all  Food Insecurity: No Food Insecurity  . Worried About [phone] in the Last Year: Never true  . Ran Out of Food in the Last Year: Never true  Transportation Needs: No Transportation Needs  . Lack of Transportation (Medical): No  . Lack of Transportation (Non-Medical): No  Physical Activity: Inactive  . Days of Exercise per  Week: 0 days  . Minutes of Exercise per Session: 0 min  Stress: No Stress Concern Present  . Feeling of Stress : Only a little  Social Connections:   . Frequency of Communication with Friends and Family: Not on file  . Frequency of Social Gatherings with Friends and Family: Not on file  . Attends Religious Services: Not on file  . Active Member of Clubs or Organizations: Not on file  . Attends Banker Meetings: Not on file  . Marital Status: Not on file    Outpatient Encounter Medications as of 01/18/2020  Medication Sig  . Accu-Chek FastClix Lancets  MISC AS DIRECTED TWICE A DAY SUBCUTANEOUS 90 DAYS E11.9  . Ascorbic Acid (VITAMIN C) 1000 MG tablet Take 1,000 mg by mouth daily.  [name] Kitchen aspirin 81 MG tablet Take 81 mg by mouth daily.  [name] Kitchen atorvastatin (LIPITOR) 10 MG tablet Take 1 tablet (10 mg total) by mouth daily.  . beta carotene w/minerals (OCUVITE) tablet Take 1 tablet by mouth daily.  . calcium citrate-vitamin D (CITRACAL+D) 315-200 MG-UNIT tablet Take 1 tablet by mouth 2 (two) times daily.  . Cyanocobalamin (VITAMIN B 12 PO) Take 1,000 mg by mouth daily.  . Dulaglutide (TRULICITY) 0.75 MG/0.5ML SOPN Inject into the skin. Inject 0.75mg  weekly  . ferrous sulfate 325 (65 FE) MG tablet TAKE 1 TABLET BY MOUTH TWICE A DAY  . glimepiride (AMARYL) 4 MG tablet TAKE 1 TABLET BY MOUTH TWICE A DAY  . hydrocortisone (PROCTOSOL HC) 2.5 % rectal cream Place 1 application rectally 2 (two) times daily.  . Insulin Pen Needle (PEN NEEDLES) 32G X 4 MM MISC 1 each by Does not apply route once a week. Use as directed once per week with Ozempic  . levothyroxine (SYNTHROID) 112 MCG tablet TAKE 1 TABLET (112 MCG TOTAL) BY MOUTH DAILY BEFORE BREAKFAST.  [name] Kitchen lisinopril (ZESTRIL) 2.5 MG tablet Take 1 tablet (2.5 mg total) by mouth daily.  . Multiple Vitamin (MULTIVITAMIN WITH MINERALS) TABS tablet Take 1 tablet by mouth daily.  . mupirocin ointment (BACTROBAN) 2 % APPLY TO AFFECTED AREA TWICE A DAY  . Omega-3 Fatty Acids (FISH OIL) 1000 MG CAPS Take by mouth.  [name] Kitchen PROCTOSOL HC 2.5 % rectal cream APPLY RECTALLY 4 TIMES A DAY FOR HEMORRHOIDS (Patient not taking: Reported on 01/18/2020)  . Wheat Dextrin (BENEFIBER DRINK MIX PO) Take by mouth.   No facility-administered encounter medications on file as of 01/18/2020.    Activities of Daily Living In your present state of health, do you have any difficulty performing the following activities: 01/18/2020 10/04/2019  Hearing? N N  Vision? N N  Difficulty concentrating or making decisions? N N  Walking or climbing stairs? N N    Dressing or bathing? N N  Doing errands, shopping? N N  Preparing Food and eating ? N N  Using the Toilet? N N  In the past six months, have you accidently leaked urine? N N  Do you have problems with loss of bowel control? N N  Managing your Medications? N N  Managing your Finances? N N  Housekeeping or managing your Housekeeping? N N  Some recent data might be hidden    Patient Care Team: [name], FNP as PCP - General (General Practice) Johney Maine, MD as Consulting Physician (Hematology) Danella Maiers, [facility] (Pharmacist) Bevelyn Ngo as Social Worker    Assessment:   This is a routine wellness examination for Stewart.  Exercise Activities and Dietary recommendations  Current Exercise Habits: The patient does not participate in regular exercise at present  Goals    . HEMOGLOBIN A1C < 7.0 (pt-stated)    . I would like to control my diabetes and optimize my medication management of my chronic conditions (pt-stated)     Current Barriers:  . Diabetes: T2DM; most recent A1c 6.3% on 11/19 (A1c 6.6% on 6/24, 8.3% on 3/23)--improved diet per patient report  . Current antihyperglycemic regimen: Trulicity 0.75mg  weekly, glimepiride 4mg  (work to optimize/potentially d/c glimepiride depending on patient duration of therapy on a sulfonylurea) o Patient continues on Trulicity 0.75mg .  She states she likes this medication and she prefers this injection over Ozempic due to ease of injection/less preparation. o [employer] C[employer] Patient assistance program-approved until 11/15/20-instructed pt to call in refills - 12/21/19: Refills delayed--3 way call performed with [facility], shipment set up; pt has 2 weeks of Trulicity left at home. o Patient denies hypoglycemia.  Discussed Bgs<70 and how to treat.  Encouraged patient to call PCP office if Bgs fall below 70. o Patient reports FBGs 180-200, she states she has started drinking V8 mixed berry juice - Instructed patient stop juice,  increase water intake - Alternatively we can increase Trulicity . Denies hypoglycemic symptoms; denies hyperglycemic symptoms . Current meal patterns: o Patient adhering to limiting carbohydrate intake o Decreased sugary drink intake o Increased water intake . Current exercise: walking as able . Current blood glucose readings: FBG: always <100 per patient report . Cardiovascular risk reduction: o Current hypertensive regimen: lisinopril 10mg  daily o Current hyperlipidemia regimen: atorvastatin 10mg  daily (surplus supply, haven't filled this year, will follow)  Pharmacist Clinical Goal(s):  [name] Kitchen Over the next 90 days, patient with work with PharmD and primary care provider to address needs related to optimized medication management of chronic conditions.  Interventions: . Comprehensive medication review performed, medication list updated in electronic medical record . Reviewed & discussed the following diabetes-related information with patient: Follow ADA recommended "diabetes-friendly" diet  (reviewed healthy snack/food options) . Discussed GLP-1 injection technique; Patient uses AccuCheck Guide glucometer . Reviewed medication purpose/side effects-->patient denies adverse events . Discussed calcium/vit D with PCP, [name].  Patient instructed to only continue Caltrate+D BID, stop additional calcium and vit D.  She reports constipation.  This will improve condition.   Patient Self Care Activities:  . Patient will check blood glucose daily , document, and provide at future appointments . Patient will focus on medication adherence by taking medications as prescribed . Patient will take medications as prescribed . Patient will contact provider with any episodes of hypoglycemia . Patient will report any questions or concerns to provider   Please see past updates related to this goal by clicking on the "Past Updates" button in the selected goal      . Patient Stated     10/04/2019,  wants to stay healthy and avoid the virus    . Patient Stated     01/18/2020, wants to maintain health and make own decisions       Fall Risk Fall Risk  01/18/2020 01/18/2020 10/04/2019 10/04/2019 08/24/2019  Falls in the past year? 0 0 0 0 0  Comment - - - - -  Risk for fall due to : Medication side effect - Medication side effect - -  Follow up - - Falls evaluation completed;Education provided;Falls prevention discussed - -   Is the patient's home free of loose throw rugs in walkways, pet beds, electrical cords, etc?   yes  Grab bars in the bathroom? no      Handrails on the stairs?   yes      Adequate lighting?   yes  Timed Get Up and Go performed: n/a  Depression Screen PHQ 2/9 Scores 01/18/2020 01/18/2020 10/04/2019 10/04/2019  PHQ - 2 Score 0 0 0 0  PHQ- 9 Score 0 - 0 -     Cognitive Function     6CIT Screen 01/18/2020 10/04/2019 08/17/2018  What Year? 0 points 0 points 0 points  What month? 0 points 0 points 0 points  What time? 0 points 0 points 0 points  Count back from 20 0 points 0 points 4 points  Months in reverse 0 points 0 points 0 points  Repeat phrase 2 points 2 points 0 points  Total Score 2 2 4     Immunization History  Administered Date(s) Administered  . Influenza,inj,Quad PF,6+ Mos 08/16/2014  . Influenza-Unspecified 08/08/2018, 08/11/2019  . PFIZER SARS-COV-2 Vaccination 12/21/2019, 01/11/2020  . Pneumococcal Polysaccharide-23 08/24/2019  . Tdap 09/18/2019    Qualifies for Shingles Vaccine? yes  Screening Tests Health Maintenance  Topic Date Due  . OPHTHALMOLOGY EXAM  07/06/1956  . HEMOGLOBIN A1C  04/03/2020  . PNA vac Low Risk Adult (2 of 2 - PCV13) 08/23/2020  . FOOT EXAM  10/03/2020  . MAMMOGRAM  10/02/2021  . COLONOSCOPY  02/25/2026  . TETANUS/TDAP  09/17/2029  . INFLUENZA VACCINE  Completed  . DEXA SCAN  Completed  . Hepatitis C Screening  Completed    Cancer Screenings: Lung: Low Dose CT Chest recommended if Age 59-80 years, 30  pack-year currently smoking OR have quit w/in 15years. Patient does not qualify. Breast:  Up to date on Mammogram? Yes   Up to date of Bone Density/Dexa? Yes Colorectal: up to date  Additional Screenings: : Hepatitis C Screening: 08/14/2014     Plan:    Patient wants to stay healthy and be able to make her own decisions.   I have personally reviewed and noted the following in the patient's chart:   . Medical and social history . Use of alcohol, tobacco or illicit drugs  . Current medications and supplements . Functional ability and status . Nutritional status . Physical activity . Advanced directives . List of other physicians . Hospitalizations, surgeries, and ER visits in previous 12 months . Vitals . Screenings to include cognitive, depression, and falls . Referrals and appointments  In addition, I have reviewed and discussed with patient certain preventive protocols, quality metrics, and best practice recommendations. A written personalized care plan for preventive services as well as general preventive health recommendations were provided to patient.     08/16/2014, LPN  Barb Merino

## 2020-01-18 NOTE — Progress Notes (Signed)
This visit occurred during the SARS-CoV-2 public health emergency.  Safety protocols were in place, including screening questions prior to the visit, additional usage of staff PPE, and extensive cleaning of exam room while observing appropriate contact time as indicated for disinfecting solutions.  Subjective:     Patient ID: Catherine [name] , female    DOB: Oct 09, 1946 , 74 y.o.   MRN: [id number]   Chief Complaint  Patient presents with  . Hypertension  . Diabetes    HPI  Hypertension This is a chronic problem. The current episode started more than 1 year ago. The problem is unchanged. The problem is controlled. Pertinent negatives include no anxiety, blurred vision, chest pain, headaches or palpitations. There are no associated agents to hypertension. Risk factors for coronary artery disease include obesity and sedentary lifestyle. There are no compliance problems.  There is no history of CVA. There is no history of chronic renal disease.  Diabetes She presents for her follow-up diabetic visit. She has type 2 diabetes mellitus. Her disease course has been stable. There are no hypoglycemic associated symptoms. Pertinent negatives for hypoglycemia include no confusion, dizziness, headaches or nervousness/anxiousness. Pertinent negatives for diabetes include no blurred vision, no chest pain, no fatigue, no polydipsia, no polyphagia and no polyuria. There are no hypoglycemic complications. Symptoms are stable. There are no diabetic complications. Pertinent negatives for diabetic complications include no CVA. Risk factors for coronary artery disease include diabetes mellitus and sedentary lifestyle. Current diabetic treatment includes oral agent (dual therapy). She is compliant with treatment all of the time. Her weight is decreasing steadily. She is following a diabetic diet. When asked about meal planning, she reported none. She has not had a previous visit with a dietitian. She rarely participates  in exercise. (She feels her blood sugar has been elevated post prandial higher.  According to her log blood sugar ranging 126-206 - feels related to late night snacks. ) An ACE inhibitor/angiotensin II receptor blocker is being taken. She does not see a podiatrist.Eye exam is not current.     Past Medical History:  Diagnosis Date  . Diabetes mellitus without complication ([place])   . Hypertension   . Thyroid disease      Family History  Problem Relation Age of Onset  . Diabetes Mellitus II Mother   . Stroke Mother   . Diabetes Mellitus II Sister   . Hypertension Sister      Current Outpatient Medications:  .  Accu-Chek FastClix Lancets MISC, AS DIRECTED TWICE A DAY SUBCUTANEOUS 90 DAYS E11.9, Disp: 204 each, Rfl: 4 .  Ascorbic Acid (VITAMIN C) 1000 MG tablet, Take 1,000 mg by mouth daily., Disp: , Rfl:  .  aspirin 81 MG tablet, Take 81 mg by mouth daily., Disp: , Rfl:  .  atorvastatin (LIPITOR) 10 MG tablet, Take 1 tablet (10 mg total) by mouth daily., Disp: 90 tablet, Rfl: 0 .  beta carotene w/minerals (OCUVITE) tablet, Take 1 tablet by mouth daily., Disp: , Rfl:  .  calcium citrate-vitamin D (CITRACAL+D) 315-200 MG-UNIT tablet, Take 1 tablet by mouth 2 (two) times daily., Disp: , Rfl:  .  Cyanocobalamin (VITAMIN B 12 PO), Take 1,000 mg by mouth daily., Disp: , Rfl:  .  Dulaglutide (TRULICITY) 6.20 BT/5.9RC SOPN, Inject into the skin. Inject 0.58m weekly, Disp: , Rfl:  .  ferrous sulfate 325 (65 FE) MG tablet, TAKE 1 TABLET BY MOUTH TWICE A DAY, Disp: 180 tablet, Rfl: 1 .  glimepiride (AMARYL) 4 MG tablet,  TAKE 1 TABLET BY MOUTH TWICE A DAY, Disp: 180 tablet, Rfl: 3 .  hydrocortisone (PROCTOSOL HC) 2.5 % rectal cream, Place 1 application rectally 2 (two) times daily., Disp: 30 g, Rfl: 2 .  Insulin Pen Needle (PEN NEEDLES) 32G X 4 MM MISC, 1 each by Does not apply route once a week. Use as directed once per week with Ozempic, Disp: 30 each, Rfl: 2 .  levothyroxine (SYNTHROID) 112 MCG  tablet, TAKE 1 TABLET (112 MCG TOTAL) BY MOUTH DAILY BEFORE BREAKFAST., Disp: 90 tablet, Rfl: 0 .  lisinopril (ZESTRIL) 10 MG tablet, TAKE 1 TABLET BY MOUTH EVERY DAY, Disp: 90 tablet, Rfl: 0 .  Multiple Vitamin (MULTIVITAMIN WITH MINERALS) TABS tablet, Take 1 tablet by mouth daily., Disp: , Rfl:  .  mupirocin ointment (BACTROBAN) 2 %, APPLY TO AFFECTED AREA TWICE A DAY, Disp: 22 g, Rfl: 0 .  Omega-3 Fatty Acids (FISH OIL) 1000 MG CAPS, Take by mouth., Disp: , Rfl:  .  Wheat Dextrin (BENEFIBER DRINK MIX PO), Take by mouth., Disp: , Rfl:  .  PROCTOSOL HC 2.5 % rectal cream, APPLY RECTALLY 4 TIMES A DAY FOR HEMORRHOIDS (Patient not taking: Reported on 01/18/2020), Disp: 56.7 g, Rfl: 3   No Known Allergies   Review of Systems  Constitutional: Negative for fatigue and fever.  Eyes: Negative for blurred vision.  Respiratory: Negative.   Cardiovascular: Negative for chest pain, palpitations and leg swelling.  Endocrine: Negative for polydipsia, polyphagia and polyuria.  Neurological: Negative for dizziness and headaches.  Psychiatric/Behavioral: Negative.  Negative for confusion. The patient is not nervous/anxious.      Today's Vitals   01/18/20 1115  BP: 126/74  Pulse: 89  Temp: 98.3 F (36.8 C)  TempSrc: Oral  Weight: 177 lb (80.3 kg)  Height: '5\' 5"'  (1.651 m)  PainSc: 0-No pain   Body mass index is 29.45 kg/m.   Objective:  Physical Exam Constitutional:      Appearance: Normal appearance.  Cardiovascular:     Rate and Rhythm: Normal rate and regular rhythm.     Pulses: Normal pulses.     Heart sounds: Normal heart sounds. No murmur.  Pulmonary:     Effort: Pulmonary effort is normal. No respiratory distress.     Breath sounds: Normal breath sounds.  Skin:    Capillary Refill: Capillary refill takes less than 2 seconds.  Neurological:     General: No focal deficit present.     Mental Status: She is alert and oriented to person, place, and time.     Cranial Nerves: No  cranial nerve deficit.  Psychiatric:        Mood and Affect: Mood normal.        Behavior: Behavior normal.        Thought Content: Thought content normal.        Judgment: Judgment normal.         Assessment And Plan:     1. Essential hypertension  Blood pressure has been lower with the systolic 23-30, occasional lightheaded will decrease her lisinopril to 2.71m - lisinopril (ZESTRIL) 2.5 MG tablet; Take 1 tablets (2.5 mg total) by mouth daily.  Dispense: 90 tablet; Refill: 1 - CMP14+EGFR  2. Type 2 diabetes mellitus without complication, without long-term current use of insulin (HCC)  Blood sugars have been slightly elevated in the morning but she feels is related to her eating late night snacks  Will check HgbA1c.  - Hemoglobin A1c  3. Acquired hypothyroidism  Chronic, good control  Continue with current medications - T4 - T3, free - TSH  4. Vitamin D deficiency  Will check vitamin D level and supplement as needed.     Also encouraged to spend 15 minutes in the sun daily.  - VITAMIN D 25 Hydroxy (Vit-D Deficiency, Fractures)   [name], FNP    THE PATIENT IS ENCOURAGED TO PRACTICE SOCIAL DISTANCING DUE TO THE COVID-19 PANDEMIC.

## 2020-01-18 NOTE — Patient Instructions (Signed)
Catherine [name] , Thank you for taking time to come for your Medicare Wellness Visit. I appreciate your ongoing commitment to your health goals. Please review the following plan we discussed and let me know if I can assist you in the future.   Screening recommendations/referrals: Colonoscopy: 02/2016 Mammogram: 09/2019 Bone Density: 09/2019 Recommended yearly ophthalmology/optometry visit for glaucoma screening and checkup Recommended yearly dental visit for hygiene and checkup  Vaccinations: Influenza vaccine: 07/2019 Pneumococcal vaccine: 08/2019 Tdap vaccine: 09/2019 Shingles vaccine: discussed    Advanced directives: Please bring a copy of your POA (Power of [place]) and/or Living Will to your next appointment.    Conditions/risks identified: overweight  Next appointment: 10/09/2020 at 10:30   Preventive Care 65 Years and Older, Female Preventive care refers to lifestyle choices and visits with your health care provider that can promote health and wellness. What does preventive care include?  A yearly physical exam. This is also called an annual well check.  Dental exams once or twice a year.  Routine eye exams. Ask your health care provider how often you should have your eyes checked.  Personal lifestyle choices, including:  Daily care of your teeth and gums.  Regular physical activity.  Eating a healthy diet.  Avoiding tobacco and drug use.  Limiting alcohol use.  Practicing safe sex.  Taking low-dose aspirin every day.  Taking vitamin and mineral supplements as recommended by your health care provider. What happens during an annual well check? The services and screenings done by your health care provider during your annual well check will depend on your age, overall health, lifestyle risk factors, and family history of disease. Counseling  Your health care provider may ask you questions about your:  Alcohol use.  Tobacco use.  Drug use.  Emotional  well-being.  Home and relationship well-being.  Sexual activity.  Eating habits.  History of falls.  Memory and ability to understand (cognition).  Work and work Astronomer.  Reproductive health. Screening  You may have the following tests or measurements:  Height, weight, and BMI.  Blood pressure.  Lipid and cholesterol levels. These may be checked every 5 years, or more frequently if you are over 81 years old.  Skin check.  Lung cancer screening. You may have this screening every year starting at age 65 if you have a 30-pack-year history of smoking and currently smoke or have quit within the past 15 years.  Fecal occult blood test (FOBT) of the stool. You may have this test every year starting at age 78.  Flexible sigmoidoscopy or colonoscopy. You may have a sigmoidoscopy every 5 years or a colonoscopy every 10 years starting at age 45.  Hepatitis C blood test.  Hepatitis B blood test.  Sexually transmitted disease (STD) testing.  Diabetes screening. This is done by checking your blood sugar (glucose) after you have not eaten for a while (fasting). You may have this done every 1-3 years.  Bone density scan. This is done to screen for osteoporosis. You may have this done starting at age 74.  Mammogram. This may be done every 1-2 years. Talk to your health care provider about how often you should have regular mammograms. Talk with your health care provider about your test results, treatment options, and if necessary, the need for more tests. Vaccines  Your health care provider may recommend certain vaccines, such as:  Influenza vaccine. This is recommended every year.  Tetanus, diphtheria, and acellular pertussis (Tdap, Td) vaccine. You may need a Td booster  every 10 years.  Zoster vaccine. You may need this after age 80.  Pneumococcal 13-valent conjugate (PCV13) vaccine. One dose is recommended after age 88.  Pneumococcal polysaccharide (PPSV23) vaccine. One  dose is recommended after age 62. Talk to your health care provider about which screenings and vaccines you need and how often you need them. This information is not intended to replace advice given to you by your health care provider. Make sure you discuss any questions you have with your health care provider. Document Released: 11/29/2015 Document Revised: 07/22/2016 Document Reviewed: 09/03/2015 Elsevier Interactive Patient Education  2017 [place] Prevention in the Home Falls can cause injuries. They can happen to people of all ages. There are many things you can do to make your home safe and to help prevent falls. What can I do on the outside of my home?  Regularly fix the edges of walkways and driveways and fix any cracks.  Remove anything that might make you trip as you walk through a door, such as a raised step or threshold.  Trim any bushes or trees on the path to your home.  Use bright outdoor lighting.  Clear any walking paths of anything that might make someone trip, such as rocks or tools.  Regularly check to see if handrails are loose or broken. Make sure that both sides of any steps have handrails.  Any raised decks and porches should have guardrails on the edges.  Have any leaves, snow, or ice cleared regularly.  Use sand or salt on walking paths during winter.  Clean up any spills in your garage right away. This includes oil or grease spills. What can I do in the bathroom?  Use night lights.  Install grab bars by the toilet and in the tub and shower. Do not use towel bars as grab bars.  Use non-skid mats or decals in the tub or shower.  If you need to sit down in the shower, use a plastic, non-slip stool.  Keep the floor dry. Clean up any water that spills on the floor as soon as it happens.  Remove soap buildup in the tub or shower regularly.  Attach bath mats securely with double-sided non-slip rug tape.  Do not have throw rugs and other  things on the floor that can make you trip. What can I do in the bedroom?  Use night lights.  Make sure that you have a light by your bed that is easy to reach.  Do not use any sheets or blankets that are too big for your bed. They should not hang down onto the floor.  Have a firm chair that has side arms. You can use this for support while you get dressed.  Do not have throw rugs and other things on the floor that can make you trip. What can I do in the kitchen?  Clean up any spills right away.  Avoid walking on wet floors.  Keep items that you use a lot in easy-to-reach places.  If you need to reach something above you, use a strong step stool that has a grab bar.  Keep electrical cords out of the way.  Do not use floor polish or wax that makes floors slippery. If you must use wax, use non-skid floor wax.  Do not have throw rugs and other things on the floor that can make you trip. What can I do with my stairs?  Do not leave any items on the stairs.  Make  sure that there are handrails on both sides of the stairs and use them. Fix handrails that are broken or loose. Make sure that handrails are as long as the stairways.  Check any carpeting to make sure that it is firmly attached to the stairs. Fix any carpet that is loose or worn.  Avoid having throw rugs at the top or bottom of the stairs. If you do have throw rugs, attach them to the floor with carpet tape.  Make sure that you have a light switch at the top of the stairs and the bottom of the stairs. If you do not have them, ask someone to add them for you. What else can I do to help prevent falls?  Wear shoes that:  Do not have high heels.  Have rubber bottoms.  Are comfortable and fit you well.  Are closed at the toe. Do not wear sandals.  If you use a stepladder:  Make sure that it is fully opened. Do not climb a closed stepladder.  Make sure that both sides of the stepladder are locked into place.  Ask  someone to hold it for you, if possible.  Clearly mark and make sure that you can see:  Any grab bars or handrails.  First and last steps.  Where the edge of each step is.  Use tools that help you move around (mobility aids) if they are needed. These include:  Canes.  Walkers.  Scooters.  Crutches.  Turn on the lights when you go into a dark area. Replace any light bulbs as soon as they burn out.  Set up your furniture so you have a clear path. Avoid moving your furniture around.  If any of your floors are uneven, fix them.  If there are any pets around you, be aware of where they are.  Review your medicines with your doctor. Some medicines can make you feel dizzy. This can increase your chance of falling. Ask your doctor what other things that you can do to help prevent falls. This information is not intended to replace advice given to you by your health care provider. Make sure you discuss any questions you have with your health care provider. Document Released: 08/29/2009 Document Revised: 04/09/2016 Document Reviewed: 12/07/2014 Elsevier Interactive Patient Education  2017 [employer].

## 2020-01-19 LAB — VITAMIN D 25 HYDROXY (VIT D DEFICIENCY, FRACTURES): Vit D, 25-Hydroxy: 38.4 ng/mL (ref 30.0–100.0)

## 2020-01-19 LAB — HEMOGLOBIN A1C
Est. average glucose Bld gHb Est-mCnc: 197 mg/dL
Hgb A1c MFr Bld: 8.5 % — ABNORMAL HIGH (ref 4.8–5.6)

## 2020-01-19 LAB — T3, FREE: T3, Free: 2.2 pg/mL (ref 2.0–4.4)

## 2020-01-19 LAB — T4: T4, Total: 9.1 ug/dL (ref 4.5–12.0)

## 2020-01-19 LAB — CMP14+EGFR
ALT: 42 IU/L — ABNORMAL HIGH (ref 0–32)
AST: 25 IU/L (ref 0–40)
Albumin/Globulin Ratio: 1.9 (ref 1.2–2.2)
Albumin: 4.5 g/dL (ref 3.7–4.7)
Alkaline Phosphatase: 84 IU/L (ref 39–117)
BUN/Creatinine Ratio: 13 (ref 12–28)
BUN: 18 mg/dL (ref 8–27)
Bilirubin Total: 0.5 mg/dL (ref 0.0–1.2)
CO2: 22 mmol/L (ref 20–29)
Calcium: 10.1 mg/dL (ref 8.7–10.3)
Chloride: 103 mmol/L (ref 96–106)
Creatinine, Ser: 1.37 mg/dL — ABNORMAL HIGH (ref 0.57–1.00)
GFR calc Af Amer: 44 mL/min/{1.73_m2} — ABNORMAL LOW (ref >59)
GFR calc non Af Amer: 39 mL/min/{1.73_m2} — ABNORMAL LOW (ref >59)
Globulin, Total: 2.4 g/dL (ref 1.5–4.5)
Glucose: 212 mg/dL — ABNORMAL HIGH (ref 65–99)
Potassium: 4.4 mmol/L (ref 3.5–5.2)
Sodium: 140 mmol/L (ref 134–144)
Total Protein: 6.9 g/dL (ref 6.0–8.5)

## 2020-01-19 LAB — TSH: TSH: 1.58 u[IU]/mL (ref 0.450–4.500)

## 2020-01-24 ENCOUNTER — Other Ambulatory Visit: Payer: Self-pay | Admitting: Nurse Practitioner

## 2020-01-24 ENCOUNTER — Ambulatory Visit (INDEPENDENT_AMBULATORY_CARE_PROVIDER_SITE_OTHER): Payer: Medicare Other

## 2020-01-24 DIAGNOSIS — E119 Type 2 diabetes mellitus without complications: Secondary | ICD-10-CM

## 2020-01-24 DIAGNOSIS — I10 Essential (primary) hypertension: Secondary | ICD-10-CM | POA: Diagnosis not present

## 2020-01-24 MED ORDER — TRULICITY 1.5 MG/0.5ML ~~LOC~~ SOAJ: 1.5000 mg | SUBCUTANEOUS | 1 refills | Status: DC

## 2020-01-26 NOTE — Progress Notes (Signed)
Chronic Care Management   Visit Note  01/24/2020 Name: FRITZIE PRIOLEAU MRN: [id number] DOB: 1946-04-28  Referred by: [name], FNP Reason for referral : Chronic Care Management and Diabetes   Catherine [name] is a 74 y.o. year old female who is a primary care patient of [name], FNP. The CCM team was consulted for assistance with chronic disease management and care coordination needs related to HTN, HLD and DMII  Review of patient status, including review of consultants reports, relevant laboratory and other test results, and collaboration with appropriate care team members and the patient's provider was performed as part of comprehensive patient evaluation and provision of chronic care management services.    SDOH (Social Determinants of Health) assessments performed: No See Care Plan activities for detailed interventions related to SDOH)     Medications: Outpatient Encounter Medications as of 01/24/2020  Medication Sig Note  . Accu-Chek FastClix Lancets MISC AS DIRECTED TWICE A DAY SUBCUTANEOUS 90 DAYS E11.9   . Ascorbic Acid (VITAMIN C) 1000 MG tablet Take 1,000 mg by mouth daily.   [name] Kitchen aspirin 81 MG tablet Take 81 mg by mouth daily.   [name] Kitchen atorvastatin (LIPITOR) 10 MG tablet Take 1 tablet (10 mg total) by mouth daily.   . beta carotene w/minerals (OCUVITE) tablet Take 1 tablet by mouth daily. 08/17/2018: Uses preservision  . calcium citrate-vitamin D (CITRACAL+D) 315-200 MG-UNIT tablet Take 1 tablet by mouth 2 (two) times daily.   . Cyanocobalamin (VITAMIN B 12 PO) Take 1,000 mg by mouth daily.   . Dulaglutide (TRULICITY) 1.5 MG/0.5ML SOPN Inject 1.5 mg into the skin once a week.   . ferrous sulfate 325 (65 FE) MG tablet TAKE 1 TABLET BY MOUTH TWICE A DAY   . glimepiride (AMARYL) 4 MG tablet TAKE 1 TABLET BY MOUTH TWICE A DAY   . hydrocortisone (PROCTOSOL HC) 2.5 % rectal cream Place 1 application rectally 2 (two) times daily.   . Insulin Pen Needle (PEN NEEDLES) 32G X 4 MM  MISC 1 each by Does not apply route once a week. Use as directed once per week with Ozempic   . levothyroxine (SYNTHROID) 112 MCG tablet TAKE 1 TABLET (112 MCG TOTAL) BY MOUTH DAILY BEFORE BREAKFAST.   [name] Kitchen lisinopril (ZESTRIL) 2.5 MG tablet Take 1 tablet (2.5 mg total) by mouth daily.   . Multiple Vitamin (MULTIVITAMIN WITH MINERALS) TABS tablet Take 1 tablet by mouth daily.   . mupirocin ointment (BACTROBAN) 2 % APPLY TO AFFECTED AREA TWICE A DAY   . Omega-3 Fatty Acids (FISH OIL) 1000 MG CAPS Take by mouth.   [name] Kitchen PROCTOSOL HC 2.5 % rectal cream APPLY RECTALLY 4 TIMES A DAY FOR HEMORRHOIDS (Patient not taking: Reported on 01/18/2020)   . Wheat Dextrin (BENEFIBER DRINK MIX PO) Take by mouth.   . [DISCONTINUED] Dulaglutide (TRULICITY) 0.75 MG/0.5ML SOPN Inject into the skin. Inject 0.75mg  weekly    No facility-administered encounter medications on file as of 01/24/2020.     Objective:   Goals Addressed            This Visit's Progress     Patient Stated   . I would like to control my diabetes and optimize my medication management of my chronic conditions (pt-stated)       Current Barriers:  . Diabetes: T2DM; most recent A1c 8.5% on 01/18/20 (A1c 6.6% on 6/24, 8.3% on 3/23) . Current antihyperglycemic regimen: Trulicity 0.75mg  weekly, glimepiride 4mg , d/c glimepiride  o Increase to Trulicity 1.5mg  weekly  given A1c increased to 8.5% - Reviewed new dosing with patient.  Patient verbalizes understanding and is in agreement with plan - D/c glimpiride after patient finishes supply o Patient prefers Trulicity over Ozempic due to ease of injection/less preparation o Patient enrolled in [place] Patient assistance program until 11/15/20-instructed pt to call in refills to [facility] for [facility] every 4 months.  This medication will ship to patient's home. o Patient denies hypoglycemia.  Reviewed hypoglycemia protocol.  Encouraged patient to call PCP office if BGs fall below 70. o  Patient reports FBGs 180-200, patient reports she is no longer drinking V8 juice - Instructed patient to avoid sugary foods, drinks, etc increase water intake and increase exercise . Denies hypoglycemic symptoms; denies hyperglycemic symptoms . Current meal patterns: o Patient adhering to limiting carbohydrate intake o Decreased sugary drink intake o Increased water intake . Current exercise: walking as able . Current blood glucose readings: FBG: 120-150 per patient report . Cardiovascular risk reduction: o Current hypertensive regimen: lisinopril 10mg  daily o Current hyperlipidemia regimen: atorvastatin 10mg  daily (90DS filled on 11/29/19; LDL 68 in 2020--lipid panel due in June 2021)  Pharmacist Clinical Goal(s):  [name] Kitchen Over the next 90 days, patient with work with PharmD and primary care provider to address needs related to optimized medication management of chronic conditions.  Interventions: . Comprehensive medication review performed, medication list updated in electronic medical record . Reviewed & discussed the following diabetes-related information with patient: Follow ADA recommended "diabetes-friendly" diet  (reviewed healthy snack/food options) . Discussed GLP-1 injection technique; Patient uses AccuCheck Guide glucometer . Reviewed medication purpose/side effects-->patient denies adverse events . ON 12/21/19-->Discussed calcium/vit D with PCP, [name].  Patient instructed to only continue Caltrate+D BID, stop additional calcium and vit D.  She reports constipation.  This will improve condition.  Patient Self Care Activities:  . Patient will check blood glucose daily , document, and provide at future appointments . Patient will focus on medication adherence by taking medications as prescribed . Patient will take medications as prescribed . Patient will contact provider with any episodes of hypoglycemia . Patient will report any questions or concerns to provider   Please see  past updates related to this goal by clicking on the "Past Updates" button in the selected goal          Plan:   The care management team will reach out to the patient again over the next 7 days.   Provider Signature Regina Eck, PharmD, BCPS Clinical Pharmacist, [facility] [place]: [phone]

## 2020-01-26 NOTE — Patient Instructions (Signed)
Visit Information  Goals Addressed            This Visit's Progress     Patient Stated   . I would like to control my diabetes and optimize my medication management of my chronic conditions (pt-stated)       Current Barriers:  . Diabetes: T2DM; most recent A1c 8.5% on 01/18/20 (A1c 6.6% on 6/24, 8.3% on 3/23) . Current antihyperglycemic regimen: Trulicity 0.75mg  weekly, glimepiride 4mg , d/c glimepiride  o Increase to Trulicity 1.5mg  weekly given A1c increased to 8.5% - Reviewed new dosing with patient.  Patient verbalizes understanding and is in agreement with plan - D/c glimpiride after patient finishes supply o Patient prefers Trulicity over Ozempic due to ease of injection/less preparation o Patient enrolled in [facility] [facility] Patient assistance program until 11/15/20-instructed pt to [facility] every 4 months.  This medication will ship to patient's home. o Patient denies hypoglycemia.  Reviewed hypoglycemia protocol.  Encouraged patient to call PCP office if BGs fall below 70. o Patient reports FBGs 180-200, patient reports she is no longer drinking V8 juice - Instructed patient to avoid sugary foods, drinks, etc increase water intake and increase exercise . Denies hypoglycemic symptoms; denies hyperglycemic symptoms . Current meal patterns: o Patient adhering to limiting carbohydrate intake o Decreased sugary drink intake o Increased water intake . Current exercise: walking as able . Current blood glucose readings: FBG: 120-150 per patient report . Cardiovascular risk reduction: o Current hypertensive regimen: lisinopril 10mg  daily o Current hyperlipidemia regimen: atorvastatin 10mg  daily (90DS filled on 11/29/19; LDL 68 in 2020--lipid panel due in June 2021)  Pharmacist Clinical Goal(s):  Over the next 90 days, patient with work with PharmD and primary care provider to address needs related to optimized medication management of  chronic conditions.  Interventions: . Comprehensive medication review performed, medication list updated in electronic medical record . Reviewed & discussed the following diabetes-related information with patient: Follow ADA recommended "diabetes-friendly" diet  (reviewed healthy snack/food options) . Discussed GLP-1 injection technique; Patient uses AccuCheck Guide glucometer . Reviewed medication purpose/side effects-->patient denies adverse events . ON 12/21/19-->Discussed calcium/vit D with PCP, July 2021.  Patient instructed to only continue Caltrate+D BID, stop additional calcium and vit D.  She reports constipation.  This will improve condition.  Patient Self Care Activities:  . Patient will check blood glucose daily , document, and provide at future appointments . Patient will focus on medication adherence by taking medications as prescribed . Patient will take medications as prescribed . Patient will contact provider with any episodes of hypoglycemia . Patient will report any questions or concerns to provider   Please see past updates related to this goal by clicking on the "Past Updates" button in the selected goal         The patient verbalized understanding of instructions provided today and declined a print copy of patient instruction materials.   The care management team will reach out to the patient again over the next 7 days.   SIGNATURE [name] Kitchen, PharmD, BCPS Clinical Pharmacist, [facility]  [facility]  Direct Dial: [phone]

## 2020-01-29 ENCOUNTER — Telehealth: Payer: Federal, State, Local not specified - PPO

## 2020-02-05 ENCOUNTER — Ambulatory Visit: Payer: Self-pay | Admitting: Pharmacist

## 2020-02-05 DIAGNOSIS — E119 Type 2 diabetes mellitus without complications: Secondary | ICD-10-CM

## 2020-02-05 DIAGNOSIS — I10 Essential (primary) hypertension: Secondary | ICD-10-CM

## 2020-02-08 NOTE — Progress Notes (Signed)
Chronic Care Management   Visit Note  02/05/2020 Name: Catherine [name] MRN: [id number] DOB: [birth year]  Referred by: [name], FNP Reason for referral : Chronic Care Management and Diabetes   Catherine [name] is a 74 y.o. year old female who is a primary care patient of [name], FNP. The CCM team was consulted for assistance with chronic disease management and care coordination needs related to DMII  Review of patient status, including review of consultants reports, relevant laboratory and other test results, and collaboration with appropriate care team members and the patient's provider was performed as part of comprehensive patient evaluation and provision of chronic care management services.    SDOH (Social Determinants of Health) assessments performed: No See Care Plan activities for detailed interventions related to SDOH     Medications: Outpatient Encounter Medications as of 02/05/2020  Medication Sig Note  . Accu-Chek FastClix Lancets MISC AS DIRECTED TWICE A DAY SUBCUTANEOUS 90 DAYS E11.9   . Ascorbic Acid (VITAMIN C) 1000 MG tablet Take 1,000 mg by mouth daily.   [name] Kitchen aspirin 81 MG tablet Take 81 mg by mouth daily.   [name] Kitchen atorvastatin (LIPITOR) 10 MG tablet Take 1 tablet (10 mg total) by mouth daily.   . beta carotene w/minerals (OCUVITE) tablet Take 1 tablet by mouth daily. 08/17/2018: Uses preservision  . calcium citrate-vitamin D (CITRACAL+D) 315-200 MG-UNIT tablet Take 1 tablet by mouth 2 (two) times daily.   . Cyanocobalamin (VITAMIN B 12 PO) Take 1,000 mg by mouth daily.   . Dulaglutide (TRULICITY) 1.5 MG/0.5ML SOPN Inject 1.5 mg into the skin once a week.   . ferrous sulfate 325 (65 FE) MG tablet TAKE 1 TABLET BY MOUTH TWICE A DAY   . glimepiride (AMARYL) 4 MG tablet TAKE 1 TABLET BY MOUTH TWICE A DAY   . hydrocortisone (PROCTOSOL HC) 2.5 % rectal cream Place 1 application rectally 2 (two) times daily.   . Insulin Pen Needle (PEN NEEDLES) 32G X 4 MM MISC 1 each  by Does not apply route once a week. Use as directed once per week with Ozempic   . levothyroxine (SYNTHROID) 112 MCG tablet TAKE 1 TABLET (112 MCG TOTAL) BY MOUTH DAILY BEFORE BREAKFAST.   [name] Kitchen lisinopril (ZESTRIL) 2.5 MG tablet Take 1 tablet (2.5 mg total) by mouth daily.   . Multiple Vitamin (MULTIVITAMIN WITH MINERALS) TABS tablet Take 1 tablet by mouth daily.   . mupirocin ointment (BACTROBAN) 2 % APPLY TO AFFECTED AREA TWICE A DAY   . Omega-3 Fatty Acids (FISH OIL) 1000 MG CAPS Take by mouth.   [name] Kitchen PROCTOSOL HC 2.5 % rectal cream APPLY RECTALLY 4 TIMES A DAY FOR HEMORRHOIDS (Patient not taking: Reported on 01/18/2020)   . Wheat Dextrin (BENEFIBER DRINK MIX PO) Take by mouth.    No facility-administered encounter medications on file as of 02/05/2020.     Objective:   Goals Addressed            This Visit's Progress     Patient Stated   . I would like to control my diabetes and optimize my medication management of my chronic conditions (pt-stated)       Current Barriers:  . Diabetes: T2DM; most recent A1c 8.5% on 01/18/20 (A1c 6.6% on 6/24, 8.3% on 3/23) . Current antihyperglycemic regimen: Trulicity 0.75mg  weekly, glimepiride 4mg , d/c glimepiride  o Increase to Trulicity 1.5mg  weekly given A1c increased to 8.5% - Reviewed new dosing with patient.  Patient verbalizes understanding and is in agreement  with plan - Refill sent in to [facility] for increased trulicity dose for patient assistance, pt currently doubling up on 0.75mg , sample placed in fridge for patient if bridge supply is needed - D/c glimpiride after patient finishes supply o Patient prefers Trulicity over Ozempic due to ease of injection/less preparation o Patient enrolled in [place] Patient assistance program until 11/15/20-instructed pt to call in refills to [facility] for [facility] every 4 months.  This medication will ship to patient's home. o Patient denies hypoglycemia.  Reviewed hypoglycemia protocol.   Encouraged patient to call PCP office if BGs fall below 70. o Patient reports FBGs 180-200, patient reports she is no longer drinking V8 juice - Instructed patient to avoid sugary foods, drinks, etc increase water intake and increase exercise . Denies hypoglycemic symptoms; denies hyperglycemic symptoms . Current meal patterns: o Patient adhering to limiting carbohydrate intake o Decreased sugary drink intake o Increased water intake . Current exercise: walking as able . Current blood glucose readings: FBG: 120-150 per patient report . Cardiovascular risk reduction: o Current hypertensive regimen: lisinopril 10mg  daily o Current hyperlipidemia regimen: atorvastatin 10mg  daily (90DS filled on 11/29/19; LDL 68 in 2020--lipid panel due in June 2021)  Pharmacist Clinical Goal(s):  [name] Kitchen Over the next 90 days, patient with work with PharmD and primary care provider to address needs related to optimized medication management of chronic conditions.  Interventions: . Comprehensive medication review performed, medication list updated in electronic medical record . Reviewed & discussed the following diabetes-related information with patient: Follow ADA recommended "diabetes-friendly" diet  (reviewed healthy snack/food options) . Discussed GLP-1 injection technique; Patient uses AccuCheck Guide glucometer . Reviewed medication purpose/side effects-->patient denies adverse events . ON 12/21/19-->Discussed calcium/vit D with PCP, [name].  Patient instructed to only continue Caltrate+D BID, stop additional calcium and vit D.  She reports constipation.  This will improve condition.  Patient Self Care Activities:  . Patient will check blood glucose daily , document, and provide at future appointments . Patient will focus on medication adherence by taking medications as prescribed . Patient will take medications as prescribed . Patient will contact provider with any episodes of hypoglycemia . Patient  will report any questions or concerns to provider   Please see past updates related to this goal by clicking on the "Past Updates" button in the selected goal          Provider Signature  Regina Eck, PharmD, [place] Pharmacist, [facility] [place]: [phone]

## 2020-02-08 NOTE — Patient Instructions (Signed)
Visit Information  Goals Addressed            This Visit's Progress     Patient Stated   . I would like to control my diabetes and optimize my medication management of my chronic conditions (pt-stated)       Current Barriers:  . Diabetes: T2DM; most recent A1c 8.5% on 01/18/20 (A1c 6.6% on 6/24, 8.3% on 3/23) . Current antihyperglycemic regimen: Trulicity 0.75mg  weekly, glimepiride 4mg , d/c glimepiride  o Increase to Trulicity 1.5mg  weekly given A1c increased to 8.5% - Reviewed new dosing with patient.  Patient verbalizes understanding and is in agreement with plan - Refill sent in to [facility] for increased trulicity dose for patient assistance, pt currently doubling up on 0.75mg , sample placed in fridge for patient if bridge supply is needed - D/c glimpiride after patient finishes supply o Patient prefers Trulicity over Ozempic due to ease of injection/less preparation o Patient enrolled in [place] [facility] Patient assistance program until 11/15/20-instructed pt to [facility] every 4 months.  This medication will ship to patient's home. o Patient denies hypoglycemia.  Reviewed hypoglycemia protocol.  Encouraged patient to call PCP office if BGs fall below 70. o Patient reports FBGs 180-200, patient reports she is no longer drinking V8 juice - Instructed patient to avoid sugary foods, drinks, etc increase water intake and increase exercise . Denies hypoglycemic symptoms; denies hyperglycemic symptoms . Current meal patterns: o Patient adhering to limiting carbohydrate intake o Decreased sugary drink intake o Increased water intake . Current exercise: walking as able . Current blood glucose readings: FBG: 120-150 per patient report . Cardiovascular risk reduction: o Current hypertensive regimen: lisinopril 10mg  daily o Current hyperlipidemia regimen: atorvastatin 10mg  daily (90DS filled on 11/29/19; LDL 68 in 2020--lipid panel due in June  2021)  Pharmacist Clinical Goal(s):  Over the next 90 days, patient with work with PharmD and primary care provider to address needs related to optimized medication management of chronic conditions.  Interventions: . Comprehensive medication review performed, medication list updated in electronic medical record . Reviewed & discussed the following diabetes-related information with patient: Follow ADA recommended "diabetes-friendly" diet  (reviewed healthy snack/food options) . Discussed GLP-1 injection technique; Patient uses AccuCheck Guide glucometer . Reviewed medication purpose/side effects-->patient denies adverse events . ON 12/21/19-->Discussed calcium/vit D with PCP, 02-03-1991.  Patient instructed to only continue Caltrate+D BID, stop additional calcium and vit D.  She reports constipation.  This will improve condition.  Patient Self Care Activities:  . Patient will check blood glucose daily , document, and provide at future appointments . Patient will focus on medication adherence by taking medications as prescribed . Patient will take medications as prescribed . Patient will contact provider with any episodes of hypoglycemia . Patient will report any questions or concerns to provider   Please see past updates related to this goal by clicking on the "Past Updates" button in the selected goal         The patient verbalized understanding of instructions provided today and declined a print copy of patient instruction materials.  SIGNATURE  [name] Kitchen, PharmD, BCPS Clinical Pharmacist, [facility]  [facility]  Direct Dial: [phone]

## 2020-02-12 ENCOUNTER — Other Ambulatory Visit: Payer: Self-pay | Admitting: Nurse Practitioner

## 2020-02-29 ENCOUNTER — Other Ambulatory Visit: Payer: Self-pay | Admitting: Nurse Practitioner

## 2020-03-21 ENCOUNTER — Other Ambulatory Visit: Payer: Self-pay

## 2020-03-30 ENCOUNTER — Other Ambulatory Visit: Payer: Self-pay | Admitting: Nurse Practitioner

## 2020-04-22 ENCOUNTER — Ambulatory Visit (INDEPENDENT_AMBULATORY_CARE_PROVIDER_SITE_OTHER): Payer: Medicare Other | Admitting: Nurse Practitioner

## 2020-04-22 ENCOUNTER — Encounter: Payer: Self-pay | Admitting: Nurse Practitioner

## 2020-04-22 ENCOUNTER — Other Ambulatory Visit: Payer: Self-pay

## 2020-04-22 VITALS — BP 124/70 | HR 76 | Temp 98.1°F | Ht 65.2 in | Wt 170.0 lb

## 2020-04-22 DIAGNOSIS — I10 Essential (primary) hypertension: Secondary | ICD-10-CM | POA: Diagnosis not present

## 2020-04-22 DIAGNOSIS — E559 Vitamin D deficiency, unspecified: Secondary | ICD-10-CM

## 2020-04-22 DIAGNOSIS — E039 Hypothyroidism, unspecified: Secondary | ICD-10-CM

## 2020-04-22 DIAGNOSIS — E119 Type 2 diabetes mellitus without complications: Secondary | ICD-10-CM

## 2020-04-22 MED ORDER — TRULICITY 1.5 MG/0.5ML ~~LOC~~ SOAJ: 1.5000 mg | SUBCUTANEOUS | 1 refills | Status: DC

## 2020-04-22 NOTE — Progress Notes (Signed)
This visit occurred during the SARS-CoV-2 public health emergency.  Safety protocols were in place, including screening questions prior to the visit, additional usage of staff PPE, and extensive cleaning of exam room while observing appropriate contact time as indicated for disinfecting solutions.  Subjective:     Patient ID: Catherine [name] , female    DOB: [birth year] , 74 y.o.   MRN: [id number]   Chief Complaint  Patient presents with   Diabetes   Hypertension    HPI  Blood pressure ranging 91/46 - 112/54.    Hypertension This is a chronic problem. The current episode started more than 1 year ago. The problem is unchanged. The problem is controlled. Pertinent negatives include no anxiety, blurred vision, chest pain, headaches or palpitations. There are no associated agents to hypertension. Risk factors for coronary artery disease include obesity and sedentary lifestyle. Past treatments include ACE inhibitors. The current treatment provides significant improvement. There are no compliance problems.  There is no history of CVA. There is no history of chronic renal disease.  Diabetes She presents for her follow-up diabetic visit. She has type 2 diabetes mellitus. Her disease course has been stable. There are no hypoglycemic associated symptoms. Pertinent negatives for hypoglycemia include no confusion, dizziness, headaches or nervousness/anxiousness. Pertinent negatives for diabetes include no blurred vision, no chest pain, no fatigue, no polydipsia, no polyphagia and no polyuria. There are no hypoglycemic complications. Symptoms are stable. There are no diabetic complications. Pertinent negatives for diabetic complications include no CVA. Risk factors for coronary artery disease include diabetes mellitus and sedentary lifestyle. Current diabetic treatment includes oral agent (dual therapy). She is compliant with treatment all of the time. Her weight is decreasing steadily. She is following a  diabetic diet. When asked about meal planning, she reported none. She has not had a previous visit with a dietitian. She rarely participates in exercise. (Blood sugars yesterday was 143 and this morning 156. Average blood sugar 110 - 290. Her husband is in the hospital since April 14th and she is not sleeping well.  ) An ACE inhibitor/angiotensin II receptor blocker is being taken. She does not see a podiatrist.Eye exam is not current.     Past Medical History:  Diagnosis Date   Diabetes mellitus without complication ([place])    Hypertension    Thyroid disease      Family History  Problem Relation Age of Onset   Diabetes Mellitus II Mother    Stroke Mother    Diabetes Mellitus II Sister    Hypertension Sister      Current Outpatient Medications:    Accu-Chek FastClix Lancets MISC, AS DIRECTED TWICE A DAY SUBCUTANEOUS 90 DAYS E11.9, Disp: 204 each, Rfl: 4   Ascorbic Acid (VITAMIN C) 1000 MG tablet, Take 1,000 mg by mouth daily., Disp: , Rfl:    aspirin 81 MG tablet, Take 81 mg by mouth daily., Disp: , Rfl:    atorvastatin (LIPITOR) 10 MG tablet, TAKE 1 TABLET BY MOUTH EVERY DAY, Disp: 90 tablet, Rfl: 0   beta carotene w/minerals (OCUVITE) tablet, Take 1 tablet by mouth daily., Disp: , Rfl:    calcium citrate-vitamin D (CITRACAL+D) 315-200 MG-UNIT tablet, Take 1 tablet by mouth 2 (two) times daily., Disp: , Rfl:    Cyanocobalamin (VITAMIN B 12 PO), Take 1,000 mg by mouth daily., Disp: , Rfl:    Dulaglutide (TRULICITY) 1.5 WN/0.2VO SOPN, Inject 1.5 mg into the skin once a week., Disp: 12 pen, Rfl: 1   ferrous sulfate  325 (65 FE) MG tablet, TAKE 1 TABLET BY MOUTH TWICE A DAY, Disp: 180 tablet, Rfl: 1   glimepiride (AMARYL) 4 MG tablet, TAKE 1 TABLET BY MOUTH TWICE A DAY, Disp: 180 tablet, Rfl: 3   hydrocortisone (PROCTOSOL HC) 2.5 % rectal cream, Place 1 application rectally 2 (two) times daily., Disp: 30 g, Rfl: 2   Insulin Pen Needle (PEN NEEDLES) 32G X 4 MM MISC, 1 each  by Does not apply route once a week. Use as directed once per week with Ozempic, Disp: 30 each, Rfl: 2   levothyroxine (SYNTHROID) 112 MCG tablet, TAKE 1 TABLET (112 MCG TOTAL) BY MOUTH DAILY BEFORE BREAKFAST., Disp: 90 tablet, Rfl: 0   lisinopril (ZESTRIL) 2.5 MG tablet, Take 1 tablet (2.5 mg total) by mouth daily., Disp: 90 tablet, Rfl: 1   Multiple Vitamin (MULTIVITAMIN WITH MINERALS) TABS tablet, Take 1 tablet by mouth daily., Disp: , Rfl:    mupirocin ointment (BACTROBAN) 2 %, APPLY TO AFFECTED AREA TWICE A DAY, Disp: 22 g, Rfl: 0   Omega-3 Fatty Acids (FISH OIL) 1000 MG CAPS, Take by mouth., Disp: , Rfl:    Wheat Dextrin (BENEFIBER DRINK MIX PO), Take by mouth., Disp: , Rfl:    No Known Allergies   Review of Systems  Constitutional: Negative for fatigue and fever.  Eyes: Negative for blurred vision.  Respiratory: Negative.  Negative for cough.   Cardiovascular: Negative for chest pain, palpitations and leg swelling.  Endocrine: Negative for polydipsia, polyphagia and polyuria.  Neurological: Negative for dizziness and headaches.  Psychiatric/Behavioral: Negative.  Negative for confusion. The patient is not nervous/anxious.      Today's Vitals   04/22/20 1046  BP: 124/70  Pulse: 76  Temp: 98.1 F (36.7 C)  TempSrc: Oral  Weight: 170 lb (77.1 kg)  Height: 5' 5.2" (1.656 m)  PainSc: 0-No pain   Body mass index is 28.12 kg/m.   Objective:  Physical Exam Constitutional:      General: She is not in acute distress.    Appearance: Normal appearance.  Cardiovascular:     Rate and Rhythm: Normal rate and regular rhythm.     Pulses: Normal pulses.     Heart sounds: Normal heart sounds. No murmur.  Pulmonary:     Effort: Pulmonary effort is normal. No respiratory distress.     Breath sounds: Normal breath sounds.  Skin:    Capillary Refill: Capillary refill takes less than 2 seconds.  Neurological:     General: No focal deficit present.     Mental Status: She is  alert and oriented to person, place, and time.     Cranial Nerves: No cranial nerve deficit.  Psychiatric:        Mood and Affect: Mood normal.        Behavior: Behavior normal.        Thought Content: Thought content normal.        Judgment: Judgment normal.         Assessment And Plan:     1. Essential hypertension  She has been taking the 10 mg tablet and cutting in half would like to start the lisinopril 2.5 mg once a day - lisinopril (ZESTRIL) 2.5 MG tablet; Take 1 tablets (2.5 mg total) by mouth daily.  Dispense: 90 tablet; Refill: 1 - CMP14+EGFR  2. Type 2 diabetes mellitus without complication, without long-term current use of insulin (HCC)  Chronic, HgbA1c was up at last visit  She is under increased stress.  She is now on 1.5 mg of Trulicity once a week.   She is planning to schedule her appt with her opthalmologist - Hemoglobin A1c - CMP14+EGFR - Dulaglutide (TRULICITY) 1.5 BU/0.3JQ SOPN; Inject 0.5 mLs (1.5 mg total) into the skin once a week.  Dispense: 12 pen; Refill: 1  3. Acquired hypothyroidism  Chronic, good control  Continue with current medications - T4 - T3, free - TSH  4. Vitamin D deficiency  Will check vitamin D level and supplement as needed.     Also encouraged to spend 15 minutes in the sun daily.  - VITAMIN D 25 Hydroxy (Vit-D Deficiency, Fractures)    [name], FNP    THE PATIENT IS ENCOURAGED TO PRACTICE SOCIAL DISTANCING DUE TO THE COVID-19 PANDEMIC.

## 2020-04-23 LAB — CMP14+EGFR
ALT: 28 IU/L (ref 0–32)
AST: 21 IU/L (ref 0–40)
Albumin/Globulin Ratio: 2.3 — ABNORMAL HIGH (ref 1.2–2.2)
Albumin: 4.5 g/dL (ref 3.7–4.7)
Alkaline Phosphatase: 78 IU/L (ref 48–121)
BUN/Creatinine Ratio: 15 (ref 12–28)
BUN: 21 mg/dL (ref 8–27)
Bilirubin Total: 0.6 mg/dL (ref 0.0–1.2)
CO2: 22 mmol/L (ref 20–29)
Calcium: 9.7 mg/dL (ref 8.7–10.3)
Chloride: 102 mmol/L (ref 96–106)
Creatinine, Ser: 1.36 mg/dL — ABNORMAL HIGH (ref 0.57–1.00)
GFR calc Af Amer: 45 mL/min/{1.73_m2} — ABNORMAL LOW (ref >59)
GFR calc non Af Amer: 39 mL/min/{1.73_m2} — ABNORMAL LOW (ref >59)
Globulin, Total: 2 g/dL (ref 1.5–4.5)
Glucose: 186 mg/dL — ABNORMAL HIGH (ref 65–99)
Potassium: 4.6 mmol/L (ref 3.5–5.2)
Sodium: 138 mmol/L (ref 134–144)
Total Protein: 6.5 g/dL (ref 6.0–8.5)

## 2020-04-23 LAB — VITAMIN D 25 HYDROXY (VIT D DEFICIENCY, FRACTURES): Vit D, 25-Hydroxy: 42.6 ng/mL (ref 30.0–100.0)

## 2020-04-23 LAB — T3, FREE: T3, Free: 2.3 pg/mL (ref 2.0–4.4)

## 2020-04-23 LAB — T4: T4, Total: 8.3 ug/dL (ref 4.5–12.0)

## 2020-04-23 LAB — TSH: TSH: 1.46 u[IU]/mL (ref 0.450–4.500)

## 2020-04-23 LAB — HEMOGLOBIN A1C
Est. average glucose Bld gHb Est-mCnc: 160 mg/dL
Hgb A1c MFr Bld: 7.2 % — ABNORMAL HIGH (ref 4.8–5.6)

## 2020-05-03 ENCOUNTER — Telehealth: Payer: Self-pay | Admitting: Nurse Practitioner

## 2020-05-03 NOTE — Chronic Care Management (AMB) (Signed)
  Care Management   Note  05/03/2020 Name: DONYELLE ENYEART MRN: [id number] DOB: February 23, 1946  Catherine [name] is a 74 y.o. year old female who is a primary care patient of [name], FNP and is actively engaged with the care management team. I reached out to Governor Specking by phone today to assist with scheduling an initial visit with the Pharmacist  Follow up plan: Unsuccessful telephone outreach attempt made. The care management team will reach out to the patient again over the next 7 days. If patient returns call to provider office, please advise to call Embedded Care Management Care Guide Gwenevere Ghazi at [phone]  [facility] Guide, Embedded Care Coordination [facility]  [place], [place] [address] Direct Dial: [phone] [email or web address] Website: [email or web address]

## 2020-05-06 NOTE — Chronic Care Management (AMB) (Signed)
°  Chronic Care Management   Outreach Note  05/06/2020 Name: Catherine [name] MRN: [id number] DOB: 14-Nov-1946  Catherine [name] is a 74 y.o. year old female who is a primary care patient of [name], FNP. I reached out to Governor Specking by phone today in response to a referral sent by Ms. Marissa Calamity Kearley's health plan.     A second unsuccessful telephone outreach was attempted today. The patient was referred to the case management team for assistance with care management and care coordination.   Follow Up Plan: The care management team will reach out to the patient again over the next 7 days. If patient returns call to provider office, please advise to call Embedded Care Management Care Guide Gwenevere Ghazi at [phone].  Gwenevere Ghazi  Care Guide, Embedded Care Coordination [facility]  [place], [place] [address] Direct Dial: [phone] [email or web address]: [email or web address]

## 2020-05-15 NOTE — Chronic Care Management (AMB) (Signed)
°  Chronic Care Management   Note  05/15/2020 Name: [name] MRN: [id number] DOB: 17-Oct-1946  Catherine [name] is a 74 y.o. year old female who is a primary care patient of [name], FNP and is actively engaged with the care management team. I reached out to Governor Specking by phone today to assist with scheduling an initial visit with the Pharmacist  Follow up plan: Telephone appointment with care management team member scheduled for: 06/19/2020.  Gwenevere Ghazi  Care Guide, Embedded Care Coordination [facility]  [place], [place] [address] Direct Dial: [phone] [email or web address] Website: [email or web address]

## 2020-05-29 ENCOUNTER — Other Ambulatory Visit: Payer: Self-pay | Admitting: Nurse Practitioner

## 2020-06-17 ENCOUNTER — Ambulatory Visit: Payer: Medicare Other

## 2020-06-17 ENCOUNTER — Telehealth: Payer: Self-pay | Admitting: *Deleted

## 2020-06-17 DIAGNOSIS — E119 Type 2 diabetes mellitus without complications: Secondary | ICD-10-CM

## 2020-06-17 DIAGNOSIS — I10 Essential (primary) hypertension: Secondary | ICD-10-CM

## 2020-06-17 NOTE — Chronic Care Management (AMB) (Signed)
  Chronic Care Management   Outreach Note  06/17/2020 Name: Catherine [name] MRN: [id number] DOB: 02-16-1946  Referred by: [name], FNP Reason for referral : Care Coordination   SW placed a successful outbound call to the patient in response to a voice message received requesting SW assistance. The patient reports she has an upcomming appointment with embedded PharmD scheduled for August 4th but is unclear of the time of the appointment. SW advised the patient her appointment is scheduled for 11:00 am. The patient reports her spouse has an outpatient procedure scheduled for that afternoon and she would like to reschedule her appointment to an earlier time that date. SW advised the patient this Clinical research associate does not assist with PharmD scheduling but would have a team member contact her regarding request.  Follow Up Plan: Collaboration with embedded PharmD and care guide Gwenevere Ghazi to assist with patient request.   Bevelyn Ngo, BSW, CDP Social Worker, Certified Dementia Practitioner [facility] Care Management [phone]

## 2020-06-17 NOTE — Chronic Care Management (AMB) (Signed)
  Chronic Care Management   Note  06/17/2020 Name: [name] MRN: [id number] DOB: 1946/05/25  Catherine [name] is a 74 y.o. year old female who is a primary care patient of [name], FNP and is actively engaged with the care management team. I reached out to Governor Specking by phone today to assist with re-scheduling an initial visit with the Pharmacist.  Follow up plan: Telephone appointment with care management team member scheduled for:06/28/2020  [facility] Guide, Embedded Care Coordination [facility]  [place], [place] [address] Direct Dial: [phone] [email or web address] Website: [email or web address]

## 2020-06-19 ENCOUNTER — Telehealth: Payer: Medicare Other

## 2020-06-27 ENCOUNTER — Telehealth: Payer: Self-pay

## 2020-06-27 NOTE — Chronic Care Management (AMB) (Signed)
Chronic Care Management Pharmacy Assistant   Name: Catherine [name]  MRN: [id number] DOB: [birth year]  Reason for Encounter: Medication Review/ Initial Questions for Pharmacist visit on 06/28/2020.  Patient Questions: Have you seen any other providers since your last visit? No  Any changes in your medications or health? Yes- last visit with [name] on 01/18/2020 Lisinopril was changed from 10 mg to 2.5 mg. Patient had a 90 day supply of the Lisinopril on hand that she wanted to complete and is taking 1/2 tablet daily. She has not started the 2.5 mg tablets of Lisinopril yet, She will start new dose on next Saturday 07/06/20. Recent blood pressure readings 107/79 and 99/50.  Trulicity was added and patient feels this medication is helping with her blood sugars, reading are down to 86 from 120's in the morning. She also has lost weight since starting medication went from 175 to 164.   Any side effects from any medications? No  Do you have an symptoms or problems not managed by your medications? No  Any concerns about your health right now? No  Has your provider asked that you check blood pressure, blood sugar, or follow special diet at home? Yes- Patient check her blood sugars twice a day, this morning blood sugars was at 86 (fasting), Patient has noticed her BS are better than what they were when she was placed on Trulicity 0.5 mg to injection once weekly.  Do you get any type of exercise on a regular basis? Yes- Patient is constantly moving with taking care of husband. Husband is currently at [facility] and she visits frequently especially during the day of his dialysis, she will make sure he is ready for [place] transportation and to the office on time.    Can you think of a goal you would like to reach for your health? Patient did not say at this time. Do you have any problems getting your medications? No- [facility] but did mention that she does prefer going to pick up  medication due to prior history of delivery problems. Mentioned [facility] and it benefits to their delivery, packaging and management that could be discussed more with pharmacist at visit. Patient agrees with this plan and would consider transfer to [facility].   Is there anything that you would like to discuss during the appointment?No  Patient aware to have blood pressure logs, blood sugar logs, medications and supplements near during telephone appointment.  PCP : [name], FNP  Allergies:  No Known Allergies  Medications: Outpatient Encounter Medications as of 06/27/2020  Medication Sig Note  . Accu-Chek FastClix Lancets MISC AS DIRECTED TWICE A DAY SUBCUTANEOUS 90 DAYS E11.9   . Ascorbic Acid (VITAMIN C) 1000 MG tablet Take 1,000 mg by mouth daily.   [name] Kitchen aspirin 81 MG tablet Take 81 mg by mouth daily.   [name] Kitchen atorvastatin (LIPITOR) 10 MG tablet TAKE 1 TABLET BY MOUTH EVERY DAY   . beta carotene w/minerals (OCUVITE) tablet Take 1 tablet by mouth daily. 08/17/2018: Uses preservision  . calcium citrate-vitamin D (CITRACAL+D) 315-200 MG-UNIT tablet Take 1 tablet by mouth 2 (two) times daily.   . Cyanocobalamin (VITAMIN B 12 PO) Take 1,000 mg by mouth daily.   . Dulaglutide (TRULICITY) 1.5 MG/0.5ML SOPN Inject 0.5 mLs (1.5 mg total) into the skin once a week.   . ferrous sulfate 325 (65 FE) MG tablet TAKE 1 TABLET BY MOUTH TWICE A DAY   . glimepiride (AMARYL) 4 MG tablet  TAKE 1 TABLET BY MOUTH TWICE A DAY   . hydrocortisone (ANUSOL-HC) 2.5 % rectal cream PLACE 1 APPLICATION RECTALLY 2 (TWO) TIMES DAILY.   [name] Kitchen Insulin Pen Needle (PEN NEEDLES) 32G X 4 MM MISC 1 each by Does not apply route once a week. Use as directed once per week with Ozempic   . levothyroxine (SYNTHROID) 112 MCG tablet TAKE 1 TABLET (112 MCG TOTAL) BY MOUTH DAILY BEFORE BREAKFAST.   [name] Kitchen lisinopril (ZESTRIL) 2.5 MG tablet Take 1 tablet (2.5 mg total) by mouth daily.   . Multiple Vitamin (MULTIVITAMIN WITH MINERALS) TABS  tablet Take 1 tablet by mouth daily.   . mupirocin ointment (BACTROBAN) 2 % APPLY TO AFFECTED AREA TWICE A DAY   . Omega-3 Fatty Acids (FISH OIL) 1000 MG CAPS Take by mouth.   . Wheat Dextrin (BENEFIBER DRINK MIX PO) Take by mouth.    No facility-administered encounter medications on file as of 06/27/2020.    Current Diagnosis: Patient Active Problem List   Diagnosis Date Noted  . Type 2 diabetes mellitus without complication, without long-term current use of insulin (HCC) 02/06/2019  . Essential hypertension 02/06/2019  . Hordeolum externum of left upper eyelid 12/14/2018  . Benign hypertension with chronic kidney disease, stage III 08/03/2018  . Diabetes type 2, controlled 08/23/2014  . Anemia 08/23/2014  . Hypokalemia 08/23/2014  . CAP (community acquired pneumonia)--treated 08/23/2014  . Physical debility 08/16/2014  . Pneumonia 08/13/2014  . DKA, type 2 (HCC) 08/13/2014  . Acute renal failure (HCC) 08/13/2014  . Hypernatremia 08/13/2014  . Elevated LFTs 08/13/2014  . Acquired hypothyroidism 08/13/2014  . DKA (diabetic ketoacidoses) (HCC) 08/13/2014    Follow-Up:  Pharmacist Review- Patient did complain about not sleeping well but she did not say she wanted anything to help with this. She also mentioned lightheadedness with low blood sugar readings but she know she needs to eat something but stays busy all the time.  Billee Cashing, CMA Clinical Pharmacist Assistant [phone]

## 2020-06-28 ENCOUNTER — Ambulatory Visit: Payer: Medicare Other

## 2020-06-28 ENCOUNTER — Other Ambulatory Visit: Payer: Self-pay

## 2020-06-28 DIAGNOSIS — E119 Type 2 diabetes mellitus without complications: Secondary | ICD-10-CM

## 2020-06-28 DIAGNOSIS — I10 Essential (primary) hypertension: Secondary | ICD-10-CM

## 2020-06-28 NOTE — Chronic Care Management (AMB) (Signed)
Chronic Care Management Pharmacy  Name: Catherine [name]  MRN: [id number] DOB: April 28, 1946  Chief Complaint/ HPI  Catherine [name],  74 y.o. , female presents for their Initial CCM visit with the clinical pharmacist via telephone due to COVID-19 Pandemic.  PCP : [name], FNP  Their chronic conditions include: Hypertension, Hyperlipidemia, Diabetes and Hypothyroidism  Office Visits: 04/22/20 OV: HTN/DM follow up. Pt has been taking lisinopril 24m 1/2 tablet daily. Would like to start lisinopril 2.549monce daily due to low BPs. HgbA1c was up at last visit. Trulicity 1.2.0UReekly was added. HgbA1c down to 7.2% today. Kidney function stable. Encouraged hydration and avoiding NSAIDs due to stage 3 CKD. Liver function, thyroid and vitamin D levels normal.   01/18/20 AWV and OV: BP has been lower and pt has occasional lightheadedness. Decrease lisinopril to 2.62m63maily.   Consult Visits:  CCM Encounters: 02/05/20 PharmD: Comprehensive medication review performed  01/24/20 PharmD: Increase Trulicity to 1.54.2HCekly. Sent in new Rx to [facility] for patient assistance. Discontinue glimepiride after pt finishes current supply.   Medications: Outpatient Encounter Medications as of 06/28/2020  Medication Sig Note  . Accu-Chek FastClix Lancets MISC AS DIRECTED TWICE A DAY SUBCUTANEOUS 90 DAYS E11.9   . Ascorbic Acid (VITAMIN C) 1000 MG tablet Take 1,000 mg by mouth daily.   . aMarland Kitchenpirin 81 MG tablet Take 81 mg by mouth daily.   . aMarland Kitchenorvastatin (LIPITOR) 10 MG tablet TAKE 1 TABLET BY MOUTH EVERY DAY   . beta carotene w/minerals (OCUVITE) tablet Take 1 tablet by mouth daily. 08/17/2018: Uses preservision  . calcium citrate-vitamin D (CITRACAL+D) 315-200 MG-UNIT tablet Take 1 tablet by mouth 2 (two) times daily.   . Cyanocobalamin (VITAMIN B 12 PO) Take 1,000 mg by mouth daily.   . Dulaglutide (TRULICITY) 1.5 MG/WC/3.7SEPN Inject 0.5 mLs (1.5 mg total) into the skin once a week.   . ferrous  sulfate 325 (65 FE) MG tablet TAKE 1 TABLET BY MOUTH TWICE A DAY   . glimepiride (AMARYL) 4 MG tablet TAKE 1 TABLET BY MOUTH TWICE A DAY   . hydrocortisone (ANUSOL-HC) 2.5 % rectal cream PLACE 1 APPLICATION RECTALLY 2 (TWO) TIMES DAILY.   . IMarland Kitchensulin Pen Needle (PEN NEEDLES) 32G X 4 MM MISC 1 each by Does not apply route once a week. Use as directed once per week with Ozempic   . levothyroxine (SYNTHROID) 112 MCG tablet TAKE 1 TABLET (112 MCG TOTAL) BY MOUTH DAILY BEFORE BREAKFAST.   . lMarland Kitchensinopril (ZESTRIL) 2.5 MG tablet TAKE 1 TABLET BY MOUTH EVERY DAY   . Multiple Vitamin (MULTIVITAMIN WITH MINERALS) TABS tablet Take 1 tablet by mouth daily.   . Multiple Vitamins-Minerals (HAIR SKIN AND NAILS FORMULA) TABS Take 1 tablet by mouth daily.   . mupirocin ointment (BACTROBAN) 2 % APPLY TO AFFECTED AREA TWICE A DAY   . Omega-3 Fatty Acids (FISH OIL) 1000 MG CAPS Take 1 capsule by mouth daily.    . Wheat Dextrin (BENEFIBER DRINK MIX PO) Take by mouth.   . [DISCONTINUED] lisinopril (ZESTRIL) 2.5 MG tablet Take 1 tablet (2.5 mg total) by mouth daily.    No facility-administered encounter medications on file as of 06/28/2020.    Current Diagnosis/Assessment:  SDOH Interventions     Most Recent Value  SDOH Interventions  Financial Strain Interventions Other (Comment)  [Will assist with refills of Trulicity through patient assistance program as needed]      Goals Addressed  This Visit's Progress   . Pharmacy Care Plan       CARE PLAN ENTRY (see longitudinal plan of care for additional care plan information)  Current Barriers:  . Chronic Disease Management support, education, and care coordination needs related to Hypertension, Hyperlipidemia, and Diabetes   Hypertension BP Readings from Last 3 Encounters:  04/22/20 124/70  01/18/20 126/74  01/18/20 126/74   . Pharmacist Clinical Goal(s): o Over the next 180 days, patient will work with PharmD and providers to maintain BP goal  <130/80 . Current regimen:  o Lisinopril 2.87m daily . Interventions: o Determined patient is still taking 1/2 tablet of lisinopril 183mdaily - Has 2 tablets left and then will switch to 2.50m90maily strength o Reviewed recent home blood pressure readings o Advised patient to increase water intake to 64 ounces daily . Patient self care activities - Over the next 180 days, patient will: o Check BP twice daily, document, and provide at future appointments o Ensure daily salt intake < 2300 mg/day o Try to exercise for 30 minutes daily 5 times per week (150 minutes per week total)  Hyperlipidemia Lab Results  Component Value Date/Time   LDLCALC 68 05/10/2019 10:37 AM   . Pharmacist Clinical Goal(s): o Over the next 180 days, patient will work with PharmD and providers to maintain LDL goal < 70 . Current regimen:  o Atorvastatin 82m350mily . Interventions: o Provided dietary and exercise recommendations o Discussed appropriate goals for triglycerides (less than 150) . Patient self care activities - Over the next 180 days, patient will: o Try to exercise for 30 minutes daily 5 times per week (150 minutes per week total) o Focus on eating a healthy, well-balanced diet (information provided)  Diabetes Lab Results  Component Value Date/Time   HGBA1C 7.2 (H) 04/22/2020 11:38 AM   HGBA1C 8.5 (H) 01/18/2020 11:43 AM   . Pharmacist Clinical Goal(s): o Over the next 90 days, patient will work with PharmD and providers to achieve A1c goal <7% . Current regimen:  . Trulicity 1.50m4.2LTkly . Glimepiride 4mg 50mce daily . Interventions: o Provided dietary and exercise recommendations o Discussed appropriate goals for fasting blood sugar (80-130) and 2 hours after eating (less than 180) o Determined patient is receiving Trulicity through [facility] assistance program (will assist with refills as needed) o Collaborate with PCP regarding possibility of decreasing or stopping  glimepiride as patient's blood sugar control has improved (upcoming appt 9/21) . Patient self care activities - Over the next 180 days, patient will: o Check blood sugar once daily, document, and provide at future appointments o Contact provider with any episodes of hypoglycemia o Try to exercise for 30 minutes daily 5 times per week (150 minutes per week total)  Medication management . Pharmacist Clinical Goal(s): o Over the next 180 days, patient will work with PharmD and providers to maintain optimal medication adherence . [facility] . Interventions o Comprehensive medication review performed. o Continue current medication management strategy . Patient self care activities - Over the next 180 days, patient will: o Focus on medication adherence by continued use of pill box o Take medications as prescribed o Report any questions or concerns to PharmD and/or provider(s)  Initial goal documentation        Diabetes   A1c goal <7%  Recent Relevant Labs: Lab Results  Component Value Date/Time   HGBA1C 7.2 (H) 04/22/2020 11:38 AM   HGBA1C 8.5 (H) 01/18/2020 11:43 AM  MICROALBUR 10 10/05/2019 12:37 PM    Last diabetic Eye exam: No results found for: HMDIABEYEEXA  Last diabetic Foot exam: No results found for: HMDIABFOOTEX   Checking BG: Daily  Recent FBG Readings: 88, 87, 88, 89, 86, 93 Recent pre-meal BG readings:  Recent 2hr PP BG readings: 140, 135, 130, 129  Recent HS BG readings:   Patient has failed these meds in past: Lantus, metformin, Kombiglyze, Ozempic Patient is currently controlled on the following medications: . Trulicity 9.4RD weekly . Glimepiride 89m twice daily  We discussed:  . Pt has lost some weight since being on Trulicity . Administers Trulicity injections on Tuesdays after lunch . Goals for FBG and 2 hr PP BG goals (80-130 and <180) . BG has improved since increasing Trulicity dose to 14.0CXweekly . Pt denies BG <70 and symptoms of  hypoglycemia . Discussed the possibility of decreasing or stopping glimepiride with patient . Pt receives Trulicity through [facility] program  Plan Continue current medications  Collaborate with PCP to see if pt should stop glimepiride   Hyperlipidemia   LDL goal < 70  Lipid Panel     Component Value Date/Time   CHOL 149 05/10/2019 1037   TRIG 182 (H) 05/10/2019 1037   HDL 45 05/10/2019 1037   LDLCALC 68 05/10/2019 1037    Hepatic Function Latest Ref Rng & Units 04/22/2020 01/18/2020 10/05/2019  Total Protein 6.0 - 8.5 g/dL 6.5 6.9 6.8  Albumin 3.7 - 4.7 g/dL 4.5 4.5 4.3  AST 0 - 40 IU/L '21 25 22  ' ALT 0 - 32 IU/L 28 42(H) 27  Alk Phosphatase 48 - 121 IU/L 78 84 75  Total Bilirubin 0.0 - 1.2 mg/dL 0.6 0.5 0.4     The 10-year ASCVD risk score (Mikey BussingDC Jr., et al., 2013) is: 21.6%   Values used to calculate the score:     Age: [age]years     Sex: Female     Is Non-Hispanic African American: Yes     Diabetic: Yes     Tobacco smoker: No     Systolic Blood Pressure: 1448mmHg     Is BP treated: Yes     HDL Cholesterol: 45 mg/dL     Total Cholesterol: 149 mg/dL   Patient has failed these meds in past: N/A Patient is currently controlled on the following medications:  . Atorvastatin 15mdaily  We discussed:   . Diet extensively o Pt says she is not eating very much due to schedule with her husband o She limits fried foods (bakes her food) o Eats fruit cups or saltines with peanut butter for breakfast o Eats vegetables and fruits o Tries to limit how much juice she drinks o Does not drink soda o Drinks water during the day (unsure of how much) o Recommend pt drink 64 ounces of water daily (8- 8 ounce bottles of water daily) o Recommend well-balanced meals  . Exercise extensively o Pt says she is very active and moving constantly o Recommend pt get 30 minutes of moderate intensity exercise daily 5 times a week (150 minutes total per  week) . Triglycerides were elevated at last lipid panel  Plan Continue current medications  Recommend lipid panel at next appointment  Hypertension   BP goal is:  <130/80  Office blood pressures are  BP Readings from Last 3 Encounters:  04/22/20 124/70  01/18/20 126/74  01/18/20 126/74   Patient checks BP at home twice daily Patient  home BP readings are ranging: 109/56, 99/51, 102/49, 110/45, 99/50, 103/49, 107/79, 102/49, 120/60  Patient has failed these meds in the past: Lisinopril/HCTZ Patient is currently controlled on the following medications:  . Lisinopril 2.54m daily  We discussed: . Denies lightheadedness and dizziness . Pt has still been taking lisinopril 118m1/2 tablet daily (2 tablets left) o After pt finishes current supply she will start the new strength (2.91m68maily) o Pt has already picked up the new strength of lisinopril  Plan Continue current medications   Hypothyroidism   Lab Results  Component Value Date/Time   TSH 1.460 04/22/2020 11:38 AM   TSH 1.580 01/18/2020 11:43 AM   FREET4 1.42 10/05/2019 08:55 AM   FREET4 1.50 05/10/2019 10:37 AM   Patient has failed these meds in past: N/A Patient is currently controlled on the following medications:  . Levothyroxine 112m16maily before breakfast  We discussed:    Takes levothyroxine first thing in the morning on an empty stomach around 7:30am  Plan Continue current medications  Health Maintenance   Patient is currently on the following medications:  . ViMarland Kitchenamin C 1000mg991mly . Aspirin 81mg 46my . Preservision daily . Ferrous sulfate 3291mg t39m daily . Hydrocortisone 2.5% rectal cream . Multivitamin daily . Omega- fatty acids 1000mg da57m. Benefiber drink once daily . Citracal + D twice daily  We discussed:    No issues with current OTC regimen  Plan Continue current medications  Discuss calcium/Vitamin D/osteoporosis screening at follow up  Vaccines   Reviewed and discussed  patient's vaccination history.    Immunization History  Administered Date(s) Administered  . Influenza,inj,Quad PF,6+ Mos 08/16/2014  . Influenza-Unspecified 08/08/2018, 08/11/2019  . PFIZER SARS-COV-2 Vaccination 12/21/2019, 01/11/2020  . Pneumococcal Polysaccharide-23 08/24/2019  . Tdap 09/18/2019   Plan Review and discuss at follow up   Medication Management   [facility] Uses pill box? Yes Pt endorses 100% compliance  We discussed:  . Importance of taking each medications daily as directed . Not interested in medication synchronization/adherence packaging/delivery available with [facility] because she [facility]  [facility] medication management strategy  Follow up: 2 month phone visit  CourtneyJannette Fogo Clinical Pharmacist [facility] [phone]

## 2020-07-10 ENCOUNTER — Other Ambulatory Visit: Payer: Self-pay | Admitting: Nurse Practitioner

## 2020-07-10 DIAGNOSIS — I10 Essential (primary) hypertension: Secondary | ICD-10-CM

## 2020-07-25 NOTE — Patient Instructions (Addendum)
Visit Information  Goals Addressed            This Visit's Progress   . Pharmacy Care Plan       CARE PLAN ENTRY (see longitudinal plan of care for additional care plan information)  Current Barriers:  . Chronic Disease Management support, education, and care coordination needs related to Hypertension, Hyperlipidemia, and Diabetes   Hypertension BP Readings from Last 3 Encounters:  04/22/20 124/70  01/18/20 126/74  01/18/20 126/74   . Pharmacist Clinical Goal(s): o Over the next 180 days, patient will work with PharmD and providers to maintain BP goal <130/80 . Current regimen:  o Lisinopril 2.5mg  daily . Interventions: o Determined patient is still taking 1/2 tablet of lisinopril 10mg  daily - Has 2 tablets left and then will switch to 2.5mg  daily strength o Reviewed recent home blood pressure readings o Advised patient to increase water intake to 64 ounces daily . Patient self care activities - Over the next 180 days, patient will: o Check BP twice daily, document, and provide at future appointments o Ensure daily salt intake < 2300 mg/day o Try to exercise for 30 minutes daily 5 times per week (150 minutes per week total)  Hyperlipidemia Lab Results  Component Value Date/Time   LDLCALC 68 05/10/2019 10:37 AM   . Pharmacist Clinical Goal(s): o Over the next 180 days, patient will work with PharmD and providers to maintain LDL goal < 70 . Current regimen:  o Atorvastatin 10mg  daily . Interventions: o Provided dietary and exercise recommendations o Discussed appropriate goals for triglycerides (less than 150) . Patient self care activities - Over the next 180 days, patient will: o Try to exercise for 30 minutes daily 5 times per week (150 minutes per week total) o Focus on eating a healthy, well-balanced diet (information provided)  Diabetes Lab Results  Component Value Date/Time   HGBA1C 7.2 (H) 04/22/2020 11:38 AM   HGBA1C 8.5 (H) 01/18/2020 11:43 AM    . Pharmacist Clinical Goal(s): o Over the next 90 days, patient will work with PharmD and providers to achieve A1c goal <7% . Current regimen:  . Trulicity 1.5mg  weekly . Glimepiride 4mg  twice daily . Interventions: o Provided dietary and exercise recommendations o Discussed appropriate goals for fasting blood sugar (80-130) and 2 hours after eating (less than 180) o Determined patient is receiving Trulicity through 06/22/2020 patient assistance program (will assist with refills as needed) o Collaborate with PCP regarding possibility of decreasing or stopping glimepiride as patient's blood sugar control has improved (upcoming appt 9/21) . Patient self care activities - Over the next 180 days, patient will: o Check blood sugar once daily, document, and provide at future appointments o Contact provider with any episodes of hypoglycemia o Try to exercise for 30 minutes daily 5 times per week (150 minutes per week total)  Medication management . Pharmacist Clinical Goal(s): o Over the next 180 days, patient will work with PharmD and providers to maintain optimal medication adherence . Current pharmacy: [facility] . Interventions o Comprehensive medication review performed. o Continue current medication management strategy . Patient self care activities - Over the next 180 days, patient will: o Focus on medication adherence by continued use of pill box o Take medications as prescribed o Report any questions or concerns to PharmD and/or provider(s)  Initial goal documentation        Ms. Luera was given information about Chronic Care Management services today including:  1. CCM service includes personalized support  from designated clinical staff supervised by her physician, including individualized plan of care and coordination with other care providers 2. 24/7 contact phone numbers for assistance for urgent and routine care needs. 3. Standard insurance, coinsurance, copays and  deductibles apply for chronic care management only during months in which we provide at least 20 minutes of these services. Most insurances cover these services at 100%, however patients may be responsible for any copay, coinsurance and/or deductible if applicable. This service may help you avoid the need for more expensive face-to-face services. 4. Only one practitioner may furnish and bill the service in a calendar month. 5. The patient may stop CCM services at any time (effective at the end of the month) by phone call to the office staff.  Patient agreed to services and verbal consent obtained.   The patient verbalized understanding of instructions provided today and agreed to receive a mailed copy of patient instruction and/or educational materials. Telephone follow up appointment with pharmacy team member scheduled for: 08/28/20 @ 4:15 PM  Beryle Flock, PharmD Clinical Pharmacist [facility] [phone]   High Triglycerides Eating Plan Triglycerides are a type of fat in the blood. High levels of triglycerides can increase your risk of heart disease and stroke. If your triglyceride levels are high, choosing the right foods can help lower your triglycerides and keep your heart healthy. Work with your health care provider or a diet and nutrition specialist (dietitian) to develop an eating plan that is right for you. What are tips for following this plan? General guidelines   Lose weight, if you are overweight. For most people, losing 5-10 lbs (2-5 kg) helps lower triglyceride levels. A weight-loss plan may include. ? 30 minutes of exercise at least 5 days a week. ? Reducing the amount of calories, sugar, and fat you eat.  Eat a wide variety of fresh fruits, vegetables, and whole grains. These foods are high in fiber.  Eat foods that contain healthy fats, such as fatty fish, nuts, seeds, and olive oil.  Avoid foods that are high in added sugar, added salt  (sodium), saturated fat, and trans fat.  Avoid low-fiber, refined carbohydrates such as white bread, crackers, noodles, and white rice.  Avoid foods with partially hydrogenated oils (trans fats), such as fried foods or stick margarine.  Limit alcohol intake to no more than 1 drink a day for nonpregnant women and 2 drinks a day for men. One drink equals 12 oz of beer, 5 oz of wine, or 1 oz of hard liquor. Your health care provider may recommend that you drink less depending on your overall health. Reading food labels  Check food labels for the amount of saturated fat. Choose foods with no or very little saturated fat.  Check food labels for the amount of trans fat. Choose foods with no trans fat.  Check food labels for the amount of cholesterol. Choose foods low in cholesterol. Ask your dietitian how much cholesterol you should have each day.  Check food labels for the amount of sodium. Choose foods with less than 140 milligrams (mg) per serving. Shopping  Buy dairy products labeled as nonfat (skim) or low-fat (1%).  Avoid buying processed or prepackaged foods. These are often high in added sugar, sodium, and fat. Cooking  Choose healthy fats when cooking, such as olive oil or canola oil.  Cook foods using lower fat methods, such as baking, broiling, boiling, or grilling.  Make your own sauces, dressings, and marinades when possible, instead  of buying them. Store-bought sauces, dressings, and marinades are often high in sodium and sugar. Meal planning  Eat more home-cooked food and less restaurant, buffet, and fast food.  Eat fatty fish at least 2 times each week. Examples of fatty fish include salmon, trout, mackerel, tuna, and herring.  If you eat whole eggs, do not eat more than 3 egg yolks per week. What foods are recommended? The items listed may not be a complete list. Talk with your dietitian about what dietary choices are best for you. Grains Whole wheat or whole grain  breads, crackers, cereals, and pasta. Unsweetened oatmeal. Bulgur. Barley. Quinoa. Brown rice. Whole wheat flour tortillas. Vegetables Fresh or frozen vegetables. Low-sodium canned vegetables. Fruits All fresh, canned (in natural juice), or frozen fruits. Meats and other protein foods Skinless chicken or [place]. Ground chicken or [place]. Lean cuts of pork, trimmed of fat. Fish and seafood, especially salmon, trout, and herring. Egg whites. Dried beans, peas, or lentils. Unsalted nuts or seeds. Unsalted canned beans. Natural peanut or almond butter. Dairy Low-fat dairy products. Skim or low-fat (1%) milk. Reduced fat (2%) and low-sodium cheese. Low-fat ricotta cheese. Low-fat cottage cheese. Plain, low-fat yogurt. Fats and oils Tub margarine without trans fats. Light or reduced-fat mayonnaise. Light or reduced-fat salad dressings. Avocado. Safflower, olive, sunflower, soybean, and canola oils. What foods are not recommended? The items listed may not be a complete list. Talk with your dietitian about what dietary choices are best for you. Grains White bread. White (regular) pasta. White rice. Cornbread. Bagels. Pastries. Crackers that contain trans fat. Vegetables Creamed or fried vegetables. Vegetables in a cheese sauce. Fruits Sweetened dried fruit. Canned fruit in syrup. Fruit juice. Meats and other protein foods Fatty cuts of meat. Ribs. Chicken wings. Tomasa Blase. Sausage. Bologna. Salami. Chitterlings. Fatback. Hot dogs. Bratwurst. Packaged lunch meats. Dairy Whole or reduced-fat (2%) milk. Half-and-half. Cream cheese. Full-fat or sweetened yogurt. Full-fat cheese. Nondairy creamers. Whipped toppings. Processed cheese or cheese spreads. Cheese curds. Beverages Alcohol. Sweetened drinks, such as soda, lemonade, fruit drinks, or punches. Fats and oils Butter. Stick margarine. Lard. Shortening. Ghee. Bacon fat. Tropical oils, such as coconut, palm kernel, or palm oils. Sweets and desserts Corn  syrup. Sugars. Honey. Molasses. Candy. Jam and jelly. Syrup. Sweetened cereals. Cookies. Pies. Cakes. Donuts. Muffins. Ice cream. Condiments Store-bought sauces, dressings, and marinades that are high in sugar, such as ketchup and barbecue sauce. Summary  High levels of triglycerides can increase the risk of heart disease and stroke. Choosing the right foods can help lower your triglycerides.  Eat plenty of fresh fruits, vegetables, and whole grains. Choose low-fat dairy and lean meats. Eat fatty fish at least twice a week.  Avoid processed and prepackaged foods with added sugar, sodium, saturated fat, and trans fat.  If you need suggestions or have questions about what types of food are good for you, talk with your health care provider or a dietitian. This information is not intended to replace advice given to you by your health care provider. Make sure you discuss any questions you have with your health care provider. Document Revised: 10/15/2017 Document Reviewed: 01/05/2017 Elsevier Patient Education  2020 [employer].

## 2020-07-28 ENCOUNTER — Other Ambulatory Visit: Payer: Self-pay | Admitting: Nurse Practitioner

## 2020-08-06 ENCOUNTER — Other Ambulatory Visit: Payer: Self-pay

## 2020-08-06 ENCOUNTER — Encounter: Payer: Self-pay | Admitting: Nurse Practitioner

## 2020-08-06 ENCOUNTER — Ambulatory Visit (INDEPENDENT_AMBULATORY_CARE_PROVIDER_SITE_OTHER): Payer: Medicare Other | Admitting: Nurse Practitioner

## 2020-08-06 VITALS — BP 124/80 | HR 73 | Temp 97.9°F | Ht 62.6 in | Wt 166.6 lb

## 2020-08-06 DIAGNOSIS — Z1159 Encounter for screening for other viral diseases: Secondary | ICD-10-CM

## 2020-08-06 DIAGNOSIS — I129 Hypertensive chronic kidney disease with stage 1 through stage 4 chronic kidney disease, or unspecified chronic kidney disease: Secondary | ICD-10-CM

## 2020-08-06 DIAGNOSIS — E039 Hypothyroidism, unspecified: Secondary | ICD-10-CM

## 2020-08-06 DIAGNOSIS — E559 Vitamin D deficiency, unspecified: Secondary | ICD-10-CM | POA: Diagnosis not present

## 2020-08-06 DIAGNOSIS — Z0001 Encounter for general adult medical examination with abnormal findings: Secondary | ICD-10-CM

## 2020-08-06 DIAGNOSIS — D649 Anemia, unspecified: Secondary | ICD-10-CM

## 2020-08-06 DIAGNOSIS — R82998 Other abnormal findings in urine: Secondary | ICD-10-CM

## 2020-08-06 DIAGNOSIS — E1122 Type 2 diabetes mellitus with diabetic chronic kidney disease: Secondary | ICD-10-CM

## 2020-08-06 DIAGNOSIS — N183 Chronic kidney disease, stage 3 unspecified: Secondary | ICD-10-CM

## 2020-08-06 DIAGNOSIS — Z0000 Encounter for general adult medical examination without abnormal findings: Secondary | ICD-10-CM

## 2020-08-06 DIAGNOSIS — E119 Type 2 diabetes mellitus without complications: Secondary | ICD-10-CM | POA: Diagnosis not present

## 2020-08-06 LAB — POCT UA - MICROALBUMIN
Albumin/Creatinine Ratio, Urine, POC: <30
Creatinine, POC: 200 mg/dL
Microalbumin Ur, POC: 10 mg/L

## 2020-08-06 LAB — POCT URINALYSIS DIPSTICK
Bilirubin, UA: NEGATIVE
Blood, UA: NEGATIVE
Glucose, UA: NEGATIVE
Ketones, UA: NEGATIVE
Nitrite, UA: NEGATIVE
Protein, UA: NEGATIVE
Spec Grav, UA: 1.015 (ref 1.010–1.025)
Urobilinogen, UA: 0.2 E.U./dL
pH, UA: 6 (ref 5.0–8.0)

## 2020-08-06 MED ORDER — LEVOTHYROXINE SODIUM 112 MCG PO TABS: 112.0000 ug | ORAL_TABLET | Freq: Every day | ORAL | 0 refills | Status: DC

## 2020-08-06 MED ORDER — FERROUS SULFATE 325 (65 FE) MG PO TABS: 325.0000 mg | ORAL_TABLET | Freq: Two times a day (BID) | ORAL | 1 refills | Status: DC

## 2020-08-06 NOTE — Progress Notes (Signed)
I,Yamilka Roman [employer] as a Education administrator for [employer], FNP.,have documented all relevant documentation on the behalf of [name], FNP,as directed by  [name], FNP while in the presence of [name], [place]. This visit occurred during the SARS-CoV-2 public health emergency.  Safety protocols were in place, including screening questions prior to the visit, additional usage of staff PPE, and extensive cleaning of exam room while observing appropriate contact time as indicated for disinfecting solutions.  Subjective:     Patient ID: Catherine [name] , female    DOB: May 02, 1946 , 74 y.o.   MRN: [id number]   Chief Complaint  Patient presents with  . Annual Exam    HPI  Here for HM   Wt Readings from Last 3 Encounters: 08/06/20 : 166 lb 9.6 oz (75.6 kg) 04/22/20 : 170 lb (77.1 kg) 01/18/20 : 177 lb (80.3 kg)  She is no longer going to [facility].    Diabetes She presents for her follow-up diabetic visit. She has type 2 diabetes mellitus. Her disease course has been stable. There are no hypoglycemic associated symptoms. Pertinent negatives for hypoglycemia include no confusion, dizziness, headaches or nervousness/anxiousness. Pertinent negatives for diabetes include no blurred vision, no chest pain, no fatigue, no polydipsia, no polyphagia and no polyuria. There are no hypoglycemic complications. Symptoms are stable. There are no diabetic complications. Pertinent negatives for diabetic complications include no CVA. Risk factors for coronary artery disease include diabetes mellitus and sedentary lifestyle. Current diabetic treatment includes oral agent (dual therapy). She is compliant with treatment all of the time. Her weight is decreasing steadily. She is following a diabetic diet. When asked about meal planning, she reported none. She has not had a previous visit with a dietitian. She rarely participates in exercise. (Blood sugars yesterday was 143 and this morning 156. Average  blood sugar 110 - 290. Her husband is in the hospital since April 14th and she is not sleeping well.  ) An ACE inhibitor/angiotensin II receptor blocker is being taken. She does not see a podiatrist.Eye exam is not current.  Hypertension This is a chronic problem. The current episode started more than 1 year ago. The problem is unchanged. The problem is controlled. Pertinent negatives include no anxiety, blurred vision, chest pain, headaches or palpitations. There are no associated agents to hypertension. Risk factors for coronary artery disease include obesity and sedentary lifestyle. Past treatments include ACE inhibitors. The current treatment provides significant improvement. There are no compliance problems.  There is no history of CVA. There is no history of chronic renal disease.     Past Medical History:  Diagnosis Date  . Diabetes mellitus without complication ([place])   . Hypertension   . Thyroid disease      Family History  Problem Relation Age of Onset  . Diabetes Mellitus II Mother   . Stroke Mother   . Diabetes Mellitus II Sister   . Hypertension Sister      Current Outpatient Medications:  .  Accu-Chek FastClix Lancets MISC, AS DIRECTED TWICE A DAY SUBCUTANEOUS 90 DAYS E11.9, Disp: 204 each, Rfl: 4 .  Ascorbic Acid (VITAMIN C) 1000 MG tablet, Take 1,000 mg by mouth daily., Disp: , Rfl:  .  aspirin 81 MG tablet, Take 81 mg by mouth daily., Disp: , Rfl:  .  atorvastatin (LIPITOR) 10 MG tablet, TAKE 1 TABLET BY MOUTH EVERY DAY, Disp: 90 tablet, Rfl: 0 .  beta carotene w/minerals (OCUVITE) tablet, Take 1 tablet by mouth daily., Disp: ,  Rfl:  .  calcium citrate-vitamin D (CITRACAL+D) 315-200 MG-UNIT tablet, Take 1 tablet by mouth 2 (two) times daily., Disp: , Rfl:  .  Cyanocobalamin (VITAMIN B 12 PO), Take 1,000 mg by mouth daily., Disp: , Rfl:  .  Dulaglutide (TRULICITY) 1.5 UP/1.0RP SOPN, Inject 0.5 mLs (1.5 mg total) into the skin once a week., Disp: 12 pen, Rfl: 1 .  ferrous  sulfate 325 (65 FE) MG tablet, Take 1 tablet (325 mg total) by mouth 2 (two) times daily., Disp: 180 tablet, Rfl: 1 .  hydrocortisone (ANUSOL-HC) 2.5 % rectal cream, PLACE 1 APPLICATION RECTALLY 2 (TWO) TIMES DAILY., Disp: 30 g, Rfl: 2 .  Insulin Pen Needle (PEN NEEDLES) 32G X 4 MM MISC, 1 each by Does not apply route once a week. Use as directed once per week with Ozempic, Disp: 30 each, Rfl: 2 .  levothyroxine (SYNTHROID) 112 MCG tablet, Take 1 tablet (112 mcg total) by mouth daily before breakfast., Disp: 90 tablet, Rfl: 0 .  lisinopril (ZESTRIL) 2.5 MG tablet, TAKE 1 TABLET BY MOUTH EVERY DAY, Disp: 90 tablet, Rfl: 1 .  Multiple Vitamin (MULTIVITAMIN WITH MINERALS) TABS tablet, Take 1 tablet by mouth daily., Disp: , Rfl:  .  Multiple Vitamins-Minerals (HAIR SKIN AND NAILS FORMULA) TABS, Take 1 tablet by mouth daily., Disp: , Rfl:  .  mupirocin ointment (BACTROBAN) 2 %, APPLY TO AFFECTED AREA TWICE A DAY, Disp: 22 g, Rfl: 0 .  Omega-3 Fatty Acids (FISH OIL) 1000 MG CAPS, Take 1 capsule by mouth daily. , Disp: , Rfl:  .  Wheat Dextrin (BENEFIBER DRINK MIX PO), Take by mouth., Disp: , Rfl:    No Known Allergies    The patient states she is post menopausal status.  Negative for any breakthrough bleeding.  Negative for: breast discharge, breast lump(s), breast pain and breast self exam. Associated symptoms include abnormal vaginal bleeding. Pertinent negatives include abnormal bleeding (hematology), anxiety, decreased libido, depression, difficulty falling sleep, dyspareunia, history of infertility, nocturia, sexual dysfunction, sleep disturbances, urinary incontinence, urinary urgency, vaginal discharge and vaginal itching. Diet regular; avoiding carbohydrates. The patient states her exercise level is minimal other than caring for her husband, he is high risk for falls.     The patient's tobacco use is:  Social History   Tobacco Use  Smoking Status Never Smoker  Smokeless Tobacco Never Used    She has been exposed to passive smoke. The patient's alcohol use is:  Social History   Substance and Sexual Activity  Alcohol Use No   .    Review of Systems  Constitutional: Negative.  Negative for fatigue.  HENT: Negative.   Eyes: Negative.  Negative for blurred vision.  Respiratory: Negative.   Cardiovascular: Negative.  Negative for chest pain, palpitations and leg swelling.  Gastrointestinal: Negative.   Endocrine: Negative.  Negative for polydipsia, polyphagia and polyuria.  Genitourinary: Negative.   Musculoskeletal: Negative.   Skin: Negative.   Allergic/Immunologic: Negative.   Neurological: Negative.  Negative for dizziness and headaches.  Hematological: Negative.   Psychiatric/Behavioral: Negative.  Negative for confusion. The patient is not nervous/anxious.      Today's Vitals   08/06/20 1104  BP: 124/80  Pulse: 73  Temp: 97.9 F (36.6 C)  TempSrc: Oral  Weight: 166 lb 9.6 oz (75.6 kg)  Height: 5' 2.6" (1.59 m)  PainSc: 0-No pain   Body mass index is 29.89 kg/m.   Objective:  Physical Exam Constitutional:      General: She is  not in acute distress.    Appearance: Normal appearance. She is well-developed.  HENT:     Head: Normocephalic and atraumatic.     Right Ear: Hearing, tympanic membrane, ear canal and external ear normal. There is no impacted cerumen.     Left Ear: Hearing, tympanic membrane, ear canal and external ear normal. There is no impacted cerumen.     Nose:     Comments: Deferred - maksed    Mouth/Throat:     Comments: Deferred - masked Eyes:     General: Lids are normal.     Extraocular Movements: Extraocular movements intact.     Conjunctiva/sclera: Conjunctivae normal.     Pupils: Pupils are equal, round, and reactive to light.     Funduscopic exam:    Right eye: No papilledema.        Left eye: No papilledema.  Neck:     Thyroid: No thyroid mass.     Vascular: No carotid bruit.  Cardiovascular:     Rate and Rhythm:  Normal rate and regular rhythm.     Pulses: Normal pulses.     Heart sounds: Normal heart sounds. No murmur heard.   Pulmonary:     Effort: Pulmonary effort is normal.     Breath sounds: Normal breath sounds.  Abdominal:     General: Abdomen is flat. Bowel sounds are normal. There is no distension.     Palpations: Abdomen is soft.     Tenderness: There is no abdominal tenderness.  Genitourinary:    Rectum: Guaiac result negative.  Musculoskeletal:        General: No swelling. Normal range of motion.     Cervical back: Full passive range of motion without pain, normal range of motion and neck supple.     Right lower leg: No edema.     Left lower leg: No edema.  Skin:    General: Skin is warm and dry.     Capillary Refill: Capillary refill takes less than 2 seconds.  Neurological:     General: No focal deficit present.     Mental Status: She is alert and oriented to person, place, and time.     Cranial Nerves: No cranial nerve deficit.     Sensory: No sensory deficit.  Psychiatric:        Mood and Affect: Mood normal.        Behavior: Behavior normal.        Thought Content: Thought content normal.        Judgment: Judgment normal.         Assessment And Plan:     1. Encounter for general adult medical examination w/o abnormal findings . Behavior modifications discussed and diet history reviewed.   . Pt will continue to exercise regularly and modify diet with low GI, plant based foods and decrease intake of processed foods.  . Recommend intake of daily multivitamin, Vitamin D, and calcium.  . Recommend mammogram and colonoscopy for preventive screenings, as well as recommend immunizations that include influenza, TDAP  2. Type 2 diabetes mellitus without complication, without long-term current use of insulin (HCC) Chronic, better controlled She is tolerating medications well No changes this visit  - POCT Urinalysis Dipstick (81002) - POCT UA - Microalbumin -  CMP14+EGFR - CBC - Hemoglobin A1c - Lipid panel  3. Acquired hypothyroidism Chronic, stable Continue with current medications - levothyroxine (SYNTHROID) 112 MCG tablet; Take 1 tablet (112 mcg total) by mouth daily before breakfast.  Dispense: 90 tablet; Refill: 0 - T4 - T3, free - TSH  4. Vitamin D deficiency  Will check vitamin D level and supplement as needed.     Also encouraged to spend 15 minutes in the sun daily.  - VITAMIN D 25 Hydroxy (Vit-D Deficiency, Fractures)  5. Encounter for hepatitis C screening test for low risk patient  Will check Hepatitis C screening due to recent recommendations to screen all adults 18 years and older - Hepatitis C antibody  6. Anemia, unspecified type - ferrous sulfate 325 (65 FE) MG tablet; Take 1 tablet (325 mg total) by mouth 2 (two) times daily.  Dispense: 180 tablet; Refill: 1  7. Benign hypertension with chronic kidney disease, stage III  Chronic, good control  EKG done with NSR HR 71 - EKG 12-Lead - CMP14+EGFR  8. Urine leukocytes  Small leukocytes in urine will send for culture  Denies any symptoms - Culture, Urine     Patient was given opportunity to ask questions. Patient verbalized understanding of the plan and was able to repeat key elements of the plan. All questions were answered to their satisfaction.   [name], FNP    I, [name], FNP, have reviewed all documentation for this visit. The documentation on 11/10/20 for the exam, diagnosis, procedures, and orders are all accurate and complete.   THE PATIENT IS ENCOURAGED TO PRACTICE SOCIAL DISTANCING DUE TO THE COVID-19 PANDEMIC.

## 2020-08-06 NOTE — Patient Instructions (Addendum)
Health Maintenance, Female Adopting a healthy lifestyle and getting preventive care are important in promoting health and wellness. Ask your health care provider about:  The right schedule for you to have regular tests and exams.  Things you can do on your own to prevent diseases and keep yourself healthy. What should I know about diet, weight, and exercise? Eat a healthy diet   Eat a diet that includes plenty of vegetables, fruits, low-fat dairy products, and lean protein.  Do not eat a lot of foods that are high in solid fats, added sugars, or sodium. Maintain a healthy weight Body mass index (BMI) is used to identify weight problems. It estimates body fat based on height and weight. Your health care provider can help determine your BMI and help you achieve or maintain a healthy weight. Get regular exercise Get regular exercise. This is one of the most important things you can do for your health. Most adults should:  Exercise for at least 150 minutes each week. The exercise should increase your heart rate and make you sweat (moderate-intensity exercise).  Do strengthening exercises at least twice a week. This is in addition to the moderate-intensity exercise.  Spend less time sitting. Even light physical activity can be beneficial. Watch cholesterol and blood lipids Have your blood tested for lipids and cholesterol at 73 years of age, then have this test every 5 years. Have your cholesterol levels checked more often if:  Your lipid or cholesterol levels are high.  You are older than 73 years of age.  You are at high risk for heart disease. What should I know about cancer screening? Depending on your health history and family history, you may need to have cancer screening at various ages. This may include screening for:  Breast cancer.  Cervical cancer.  Colorectal cancer.  Skin cancer.  Lung cancer. What should I know about heart disease, diabetes, and high blood  pressure? Blood pressure and heart disease  High blood pressure causes heart disease and increases the risk of stroke. This is more likely to develop in people who have high blood pressure readings, are of African descent, or are overweight.  Have your blood pressure checked: ? Every 3-5 years if you are 18-39 years of age. ? Every year if you are 40 years old or older. Diabetes Have regular diabetes screenings. This checks your fasting blood sugar level. Have the screening done:  Once every three years after age 40 if you are at a normal weight and have a low risk for diabetes.  More often and at a younger age if you are overweight or have a high risk for diabetes. What should I know about preventing infection? Hepatitis B If you have a higher risk for hepatitis B, you should be screened for this virus. Talk with your health care provider to find out if you are at risk for hepatitis B infection. Hepatitis C Testing is recommended for:  Everyone born from 1945 through 1965.  Anyone with known risk factors for hepatitis C. Sexually transmitted infections (STIs)  Get screened for STIs, including gonorrhea and chlamydia, if: ? You are sexually active and are younger than 73 years of age. ? You are older than 73 years of age and your health care provider tells you that you are at risk for this type of infection. ? Your sexual activity has changed since you were last screened, and you are at increased risk for chlamydia or gonorrhea. Ask your health care provider if   you are at risk.  Ask your health care provider about whether you are at high risk for HIV. Your health care provider may recommend a prescription medicine to help prevent HIV infection. If you choose to take medicine to prevent HIV, you should first get tested for HIV. You should then be tested every 3 months for as long as you are taking the medicine. Pregnancy  If you are about to stop having your period (premenopausal) and  you may become pregnant, seek counseling before you get pregnant.  Take 400 to 800 micrograms (mcg) of folic acid every day if you become pregnant.  Ask for birth control (contraception) if you want to prevent pregnancy. Osteoporosis and menopause Osteoporosis is a disease in which the bones lose minerals and strength with aging. This can result in bone fractures. If you are 65 years old or older, or if you are at risk for osteoporosis and fractures, ask your health care provider if you should:  Be screened for bone loss.  Take a calcium or vitamin D supplement to lower your risk of fractures.  Be given hormone replacement therapy (HRT) to treat symptoms of menopause. Follow these instructions at home: Lifestyle  Do not use any products that contain nicotine or tobacco, such as cigarettes, e-cigarettes, and chewing tobacco. If you need help quitting, ask your health care provider.  Do not use street drugs.  Do not share needles.  Ask your health care provider for help if you need support or information about quitting drugs. Alcohol use  Do not drink alcohol if: ? Your health care provider tells you not to drink. ? You are pregnant, may be pregnant, or are planning to become pregnant.  If you drink alcohol: ? Limit how much you use to 0-1 drink a day. ? Limit intake if you are breastfeeding.  Be aware of how much alcohol is in your drink. In the [place] one drink equals one 12 oz bottle of beer (355 mL), one 5 oz glass of wine (148 mL), or one 1 oz glass of hard liquor (44 mL). General instructions  Schedule regular health, dental, and eye exams.  Stay current with your vaccines.  Tell your health care provider if: ? You often feel depressed. ? You have ever been abused or do not feel safe at home. Summary  Adopting a healthy lifestyle and getting preventive care are important in promoting health and wellness.  Follow your health care provider's instructions about healthy  diet, exercising, and getting tested or screened for diseases.  Follow your health care provider's instructions on monitoring your cholesterol and blood pressure. This information is not intended to replace advice given to you by your health care provider. Make sure you discuss any questions you have with your health care provider. Document Revised: 10/26/2018 Document Reviewed: 10/26/2018 Elsevier Patient Education  2020 [employer] Inc[employer]  

## 2020-08-07 LAB — CBC
Hematocrit: 33.7 % — ABNORMAL LOW (ref 34.0–46.6)
Hemoglobin: 11.4 g/dL (ref 11.1–15.9)
MCH: 30.1 pg (ref 26.6–33.0)
MCHC: 33.8 g/dL (ref 31.5–35.7)
MCV: 89 fL (ref 79–97)
Platelets: 215 10*3/uL (ref 150–450)
RBC: 3.79 x10E6/uL (ref 3.77–5.28)
RDW: 12.3 % (ref 11.7–15.4)
WBC: 4.4 10*3/uL (ref 3.4–10.8)

## 2020-08-07 LAB — HEMOGLOBIN A1C
Est. average glucose Bld gHb Est-mCnc: 131 mg/dL
Hgb A1c MFr Bld: 6.2 % — ABNORMAL HIGH (ref 4.8–5.6)

## 2020-08-07 LAB — CMP14+EGFR
ALT: 35 IU/L — ABNORMAL HIGH (ref 0–32)
AST: 21 IU/L (ref 0–40)
Albumin/Globulin Ratio: 2.3 — ABNORMAL HIGH (ref 1.2–2.2)
Albumin: 4.8 g/dL — ABNORMAL HIGH (ref 3.7–4.7)
Alkaline Phosphatase: 88 IU/L (ref 44–121)
BUN/Creatinine Ratio: 14 (ref 12–28)
BUN: 19 mg/dL (ref 8–27)
Bilirubin Total: 0.5 mg/dL (ref 0.0–1.2)
CO2: 24 mmol/L (ref 20–29)
Calcium: 9.9 mg/dL (ref 8.7–10.3)
Chloride: 105 mmol/L (ref 96–106)
Creatinine, Ser: 1.32 mg/dL — ABNORMAL HIGH (ref 0.57–1.00)
GFR calc Af Amer: 46 mL/min/{1.73_m2} — ABNORMAL LOW (ref >59)
GFR calc non Af Amer: 40 mL/min/{1.73_m2} — ABNORMAL LOW (ref >59)
Globulin, Total: 2.1 g/dL (ref 1.5–4.5)
Glucose: 126 mg/dL — ABNORMAL HIGH (ref 65–99)
Potassium: 5.1 mmol/L (ref 3.5–5.2)
Sodium: 141 mmol/L (ref 134–144)
Total Protein: 6.9 g/dL (ref 6.0–8.5)

## 2020-08-07 LAB — LIPID PANEL
Chol/HDL Ratio: 3.2 ratio (ref 0.0–4.4)
Cholesterol, Total: 160 mg/dL (ref 100–199)
HDL: 50 mg/dL (ref >39)
LDL Chol Calc (NIH): 89 mg/dL (ref 0–99)
Triglycerides: 114 mg/dL (ref 0–149)
VLDL Cholesterol Cal: 21 mg/dL (ref 5–40)

## 2020-08-07 LAB — T3, FREE: T3, Free: 2.3 pg/mL (ref 2.0–4.4)

## 2020-08-07 LAB — TSH: TSH: 0.868 u[IU]/mL (ref 0.450–4.500)

## 2020-08-07 LAB — HEPATITIS C ANTIBODY: Hep C Virus Ab: 0.1 s/co ratio (ref 0.0–0.9)

## 2020-08-07 LAB — VITAMIN D 25 HYDROXY (VIT D DEFICIENCY, FRACTURES): Vit D, 25-Hydroxy: 35 ng/mL (ref 30.0–100.0)

## 2020-08-07 LAB — T4: T4, Total: 9.6 ug/dL (ref 4.5–12.0)

## 2020-08-08 LAB — URINE CULTURE

## 2020-08-16 ENCOUNTER — Telehealth: Payer: Self-pay

## 2020-08-16 NOTE — Chronic Care Management (AMB) (Signed)
Chronic Care Management Pharmacy Assistant   Name: Catherine [name]  MRN: [id number] DOB: [birth year]  Reason for Encounter: Disease State / Hypertension, Diabetes and Lipids Adherence Call.    Patient Questions:   1.  Have you seen any other providers since your last visit? No    2.  Any changes in your medicines or health? Yes  Glimepiride 4 mg was d/c .     PCP : [name], FNP  Allergies:  No Known Allergies  Medications: Outpatient Encounter Medications as of 08/16/2020  Medication Sig Note  . Accu-Chek FastClix Lancets MISC AS DIRECTED TWICE A DAY SUBCUTANEOUS 90 DAYS E11.9   . Ascorbic Acid (VITAMIN C) 1000 MG tablet Take 1,000 mg by mouth daily.   [name] Kitchen aspirin 81 MG tablet Take 81 mg by mouth daily.   [name] Kitchen atorvastatin (LIPITOR) 10 MG tablet TAKE 1 TABLET BY MOUTH EVERY DAY   . beta carotene w/minerals (OCUVITE) tablet Take 1 tablet by mouth daily. 08/17/2018: Uses preservision  . calcium citrate-vitamin D (CITRACAL+D) 315-200 MG-UNIT tablet Take 1 tablet by mouth 2 (two) times daily.   . Cyanocobalamin (VITAMIN B 12 PO) Take 1,000 mg by mouth daily.   . Dulaglutide (TRULICITY) 1.5 MG/0.5ML SOPN Inject 0.5 mLs (1.5 mg total) into the skin once a week.   . ferrous sulfate 325 (65 FE) MG tablet Take 1 tablet (325 mg total) by mouth 2 (two) times daily.   . hydrocortisone (ANUSOL-HC) 2.5 % rectal cream PLACE 1 APPLICATION RECTALLY 2 (TWO) TIMES DAILY.   [name] Kitchen Insulin Pen Needle (PEN NEEDLES) 32G X 4 MM MISC 1 each by Does not apply route once a week. Use as directed once per week with Ozempic   . levothyroxine (SYNTHROID) 112 MCG tablet Take 1 tablet (112 mcg total) by mouth daily before breakfast.   . lisinopril (ZESTRIL) 2.5 MG tablet TAKE 1 TABLET BY MOUTH EVERY DAY   . Multiple Vitamin (MULTIVITAMIN WITH MINERALS) TABS tablet Take 1 tablet by mouth daily.   . Multiple Vitamins-Minerals (HAIR SKIN AND NAILS FORMULA) TABS Take 1 tablet by mouth daily.   . mupirocin ointment  (BACTROBAN) 2 % APPLY TO AFFECTED AREA TWICE A DAY   . Omega-3 Fatty Acids (FISH OIL) 1000 MG CAPS Take 1 capsule by mouth daily.    . Wheat Dextrin (BENEFIBER DRINK MIX PO) Take by mouth.    No facility-administered encounter medications on file as of 08/16/2020.    Current Diagnosis: Patient Active Problem List   Diagnosis Date Noted  . Type 2 diabetes mellitus without complication, without long-term current use of insulin (HCC) 02/06/2019  . Essential hypertension 02/06/2019  . Hordeolum externum of left upper eyelid 12/14/2018  . Benign hypertension with chronic kidney disease, stage III (HCC) 08/03/2018  . Diabetes type 2, controlled 08/23/2014  . Anemia 08/23/2014  . Hypokalemia 08/23/2014  . CAP (community acquired pneumonia)--treated 08/23/2014  . Physical debility 08/16/2014  . Pneumonia 08/13/2014  . DKA, type 2 (HCC) 08/13/2014  . Acute renal failure (HCC) 08/13/2014  . Hypernatremia 08/13/2014  . Elevated LFTs 08/13/2014  . Acquired hypothyroidism 08/13/2014  . DKA (diabetic ketoacidoses) 08/13/2014   Recent Relevant Labs: Lab Results  Component Value Date/Time   HGBA1C 6.2 (H) 08/06/2020 03:02 PM   HGBA1C 7.2 (H) 04/22/2020 11:38 AM   MICROALBUR 10 08/06/2020 01:40 PM   MICROALBUR 10 10/05/2019 12:37 PM    Kidney Function Lab Results  Component Value Date/Time   CREATININE 1.32 (H) 08/06/2020  03:02 PM   CREATININE 1.36 (H) 04/22/2020 11:38 AM   CREATININE 1.79 (H) 12/21/2017 11:26 AM   GFRNONAA 40 (L) 08/06/2020 03:02 PM   GFRNONAA 28 (L) 12/21/2017 11:26 AM   GFRAA 46 (L) 08/06/2020 03:02 PM   GFRAA 32 (L) 12/21/2017 11:26 AM    . Current antihyperglycemic regimen:   o Trulicity 1.5 mg weekly  What recent interventions/DTPs have been made to improve glycemic control:  Patient states she takes her medication as directed.  Patient states she is eating healthy.  . Have there been any recent hospitalizations or ED visits since last visit with CPP? No    . Patient denies hypoglycemic symptoms, including Pale, Sweaty, Shaky, Hungry, Nervous/irritable and Vision changes . Patient denies hyperglycemic symptoms, including blurry vision, excessive thirst, fatigue, polyuria and weakness   . How often are you checking your blood sugar? One time a day  . What are your blood sugars ranging?  o Fasting: 08/16/2020 - 132 o 08/15/2020 - 128 o 08/14/2020 - 125 o 08/13/2020 - 103 o 08/12/2020 - 129 o 08/11/2020 -  130 o Before meals: none o After meals: none o Bedtime: none  . During the week, how often does your blood glucose drop below 70? No  . Are you checking your feet daily/regularly? Yes. Patient states she has no sores, numbness, tingling or no pain.   Adherence Review: Is the patient currently on a STATIN medication? Yes. Atorvastatin 10 mg daily. Is the patient currently on ACE/ARB medication? Yes. Lisinopril 2.5 daily. Does the patient have >5 day gap between last estimated fill dates? No  Reviewed chart prior to disease state call. Spoke with patient regarding BP  Recent Office Vitals: BP Readings from Last 3 Encounters:  08/06/20 124/80  04/22/20 124/70  01/18/20 126/74   Pulse Readings from Last 3 Encounters:  08/06/20 73  04/22/20 76  01/18/20 89    Wt Readings from Last 3 Encounters:  08/06/20 166 lb 9.6 oz (75.6 kg)  04/22/20 170 lb (77.1 kg)  01/18/20 177 lb (80.3 kg)     Kidney Function Lab Results  Component Value Date/Time   CREATININE 1.32 (H) 08/06/2020 03:02 PM   CREATININE 1.36 (H) 04/22/2020 11:38 AM   CREATININE 1.79 (H) 12/21/2017 11:26 AM   GFRNONAA 40 (L) 08/06/2020 03:02 PM   GFRNONAA 28 (L) 12/21/2017 11:26 AM   GFRAA 46 (L) 08/06/2020 03:02 PM   GFRAA 32 (L) 12/21/2017 11:26 AM    BMP Latest Ref Rng & Units 08/06/2020 04/22/2020 01/18/2020  Glucose 65 - 99 mg/dL 938(B) 017(P) 102(H)  BUN 8 - 27 mg/dL 19 21 18   Creatinine 0.57 - 1.00 mg/dL ) 8.52(D) 7.82(U)  BUN/Creat Ratio 12 - 28  14 15 13   Sodium 134 - 144 mmol/L 141 138 140  Potassium 3.5 - 5.2 mmol/L 5.1 4.6 4.4  Chloride 96 - 106 mmol/L 105 102 103  CO2 20 - 29 mmol/L 24 22 22   Calcium 8.7 - 10.3 mg/dL 9.9 9.7 2.35(T    . Current antihypertensive regimen:  o Lisinopril 2.5 mg daily  . How often are you checking your Blood Pressure? Twice a day  . Current home BP readings: 08/16/2020 - 127/57 heart rate 64 . 08/15/2020 - 114/56 heart rate 65 /AM  109/48 PM  . 08/14/2020 - 117/46 heart rate 67/ AM  108/46 PM heart rate 68 . 08/13/2020 - 118/50 heart rate 62/ AM  109/48 PM heart rate 68  . What recent interventions/DTPs have  been made by any provider to improve Blood Pressure control since last CPP Visit: Patient is taking her Lisinopril , and eating healthy.  . Any recent hospitalizations or ED visits since last visit with CPP? No.  . What diet changes have been made to improve Blood Pressure Control?  o Patient states she is eating baked foods and vegetables.  . What exercise is being done to improve your Blood Pressure Control?  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  Adherence Review: Is the patient currently on ACE/ARB medication? Yes. Lisinopril 2.5 daily. Does the patient have >5 day gap between last estimated fill dates? No  Comprehensive medication review performed; Spoke to patient regarding cholesterol  Lipid Panel    Component Value Date/Time   CHOL 160 08/06/2020 1502   TRIG 114 08/06/2020 1502   HDL 50 08/06/2020 1502   LDLCALC 89 08/06/2020 1502    10-year ASCVD risk score: The 10-year ASCVD risk score Denman George(Goff [place] Montez HagemanJr., et al., 2013) is: 23.6%   Values used to calculate the score:     Age: [age] years     Sex: Female     Is Non-Hispanic African American: Yes     Diabetic: Yes     Tobacco smoker: No     Systolic Blood Pressure: 124 mmHg     Is BP treated: Yes     HDL Cholesterol: 50 mg/dL      Total Cholesterol: 160 mg/dL  . Current antihyperlipidemic regimen:  o Atorvastatin 10 mg daily  . Previous antihyperlipidemic medications tried: None  . ASCVD risk enhancing conditions: age 25>65, DM, HTN, CKD, CHF, current smoker  . What recent interventions/DTPs have been made by any provider to improve Cholesterol control since last CPP Visit:  Patient states she takes her medications as directed by provider and has been eating less fried foods.   . Any recent hospitalizations or ED visits since last visit with CPP? No  . What diet changes have been made to improve Cholesterol?  o Patient states that she bakes her food.  . What exercise is being done to improve Cholesterol?  o Patient states she is very active.   Adherence Review: Does the patient have >5 day gap between last estimated fill dates? No     Goals Addressed            This Visit's Progress   . Pharmacy Care Plan   On track    CARE PLAN ENTRY (see longitudinal plan of care for additional care plan information)  Current Barriers:  . Chronic Disease Management support, education, and care coordination needs related to Hypertension, Hyperlipidemia, and Diabetes   Hypertension BP Readings from Last 3 Encounters:  04/22/20 124/70  01/18/20 126/74  01/18/20 126/74   . Pharmacist Clinical Goal(s): o Over the next 180 days, patient will work with PharmD and providers to maintain BP goal <130/80 . Current regimen:  o Lisinopril 2.5mg  daily . Interventions: o Determined patient is still taking 1/2 tablet of lisinopril 10mg  daily - Has 2 tablets left and then will switch to 2.5mg  daily strength o Reviewed recent home blood pressure readings o Advised patient to increase water intake to 64 ounces daily . Patient self care activities - Over the next 180 days, patient will: o Check BP twice daily, document, and provide at future appointments o Ensure daily salt intake < 2300 mg/day o Try  to exercise for 30  minutes daily 5 times per week (150 minutes per week total)  Hyperlipidemia Lab Results  Component Value Date/Time   LDLCALC 68 05/10/2019 10:37 AM   . Pharmacist Clinical Goal(s): o Over the next 180 days, patient will work with PharmD and providers to maintain LDL goal < 70 . Current regimen:  o Atorvastatin 10mg  daily . Interventions: o Provided dietary and exercise recommendations o Discussed appropriate goals for triglycerides (less than 150) . Patient self care activities - Over the next 180 days, patient will: o Try to exercise for 30 minutes daily 5 times per week (150 minutes per week total) o Focus on eating a healthy, well-balanced diet (information provided)  Diabetes Lab Results  Component Value Date/Time   HGBA1C 7.2 (H) 04/22/2020 11:38 AM   HGBA1C 8.5 (H) 01/18/2020 11:43 AM   . Pharmacist Clinical Goal(s): o Over the next 90 days, patient will work with PharmD and providers to achieve A1c goal <7% . Current regimen:  . Trulicity 1.5mg  weekly . Glimepiride 4mg  twice daily . Interventions: o Provided dietary and exercise recommendations o Discussed appropriate goals for fasting blood sugar (80-130) and 2 hours after eating (less than 180) o Determined patient is receiving Trulicity through 03/19/2020 patient assistance program (will assist with refills as needed) o Collaborate with PCP regarding possibility of decreasing or stopping glimepiride as patient's blood sugar control has improved (upcoming appt 9/21) . Patient self care activities - Over the next 180 days, patient will: o Check blood sugar once daily, document, and provide at future appointments o Contact provider with any episodes of hypoglycemia o Try to exercise for 30 minutes daily 5 times per week (150 minutes per week total)  Medication management . Pharmacist Clinical Goal(s): o Over the next 180 days, patient will work with PharmD and providers to maintain optimal medication  adherence . Current pharmacy: [facility] . Interventions o Comprehensive medication review performed. o Continue current medication management strategy . Patient self care activities - Over the next 180 days, patient will: o Focus on medication adherence by continued use of pill box o Take medications as prescribed o Report any questions or concerns to PharmD and/or provider(s)  Initial goal documentation        Follow-Up:  Pharmacist Review - Patient has appointment with Pharmacist on 08/28/2020, reminded her to have all medications at time of visit. Patient would like to check to see if she can get an appointment for flu shot and the booster for Covid  10/21, CPP Notified   08/30/2020, [facility] Clinical Pharmacist Assistant [phone]

## 2020-08-28 ENCOUNTER — Ambulatory Visit: Payer: Federal, State, Local not specified - PPO | Admitting: Pharmacist

## 2020-08-28 DIAGNOSIS — I10 Essential (primary) hypertension: Secondary | ICD-10-CM

## 2020-08-28 DIAGNOSIS — E119 Type 2 diabetes mellitus without complications: Secondary | ICD-10-CM

## 2020-08-28 NOTE — Patient Instructions (Addendum)
Visit Information  Goals Addressed            This Visit's Progress   . Pharmacy Care Plan       CARE PLAN ENTRY (see longitudinal plan of care for additional care plan information)  Current Barriers:  . Chronic Disease Management support, education, and care coordination needs related to Hypertension, Hyperlipidemia, and Diabetes   Hypertension BP Readings from Last 3 Encounters:  08/06/20 124/80  04/22/20 124/70  01/18/20 126/74   . Pharmacist Clinical Goal(s): o Over the next 90 days, patient will work with PharmD and providers to maintain BP goal <130/80 . Current regimen:  o Lisinopril 2.5mg  daily . Interventions: o Reviewed recent home blood pressure readings o Commended her on her diet and exercise lifestyle changes . Patient self care activities - Over the next 180 days, patient will: o Check BP twice daily, document, and provide at future appointments o Ensure daily salt intake < 2300 mg/day o Try to exercise for 30 minutes daily 5 times per week (150 minutes per week total)  Hyperlipidemia Lab Results  Component Value Date/Time   LDLCALC 89 08/06/2020 03:02 PM   . Pharmacist Clinical Goal(s): o Over the next 90 days, patient will work with PharmD and providers to achieve LDL goal < 70 . Current regimen:  o Atorvastatin 10mg  daily . Interventions: o Provided dietary and exercise recommendations o Discussed appropriate goals for LDL  . Patient self care activities - Over the next 90 days, patient will: o Try to exercise for 30 minutes daily 5 times per week (150 minutes per week total) o Focus on eating a healthy, well-balanced diet   Diabetes Lab Results  Component Value Date/Time   HGBA1C 6.2 (H) 08/06/2020 03:02 PM   HGBA1C 7.2 (H) 04/22/2020 11:38 AM   . Pharmacist Clinical Goal(s): o Over the next 90 days, patient will work with PharmD and providers to maintain A1c goal <7% . Current regimen:  . Trulicity 1.5mg   weekly . Interventions: o Discussed appropriate goals for fasting blood sugar (80-130) and 2 hours after eating (less than 180) o Will need to re-enroll patient for 06/22/2020 for 2022 . Patient self care activities - Over the next 90 days, patient will: o Check blood sugar once daily, document, and provide at future appointments o Contact provider with any episodes of hypoglycemia o Try to exercise for 30 minutes daily 5 times per week (150 minutes per week total)  Medication management . Pharmacist Clinical Goal(s): o Over the next 180 days, patient will work with PharmD and providers to maintain optimal medication adherence . Current [facility]: [facility] . Interventions o Comprehensive medication review performed. o Continue current medication management strategy . Patient self care activities - Over the next 180 days, patient will: o Focus on medication adherence by continued use of pill box o Take medications as prescribed o Report any questions or concerns to PharmD and/or provider(s)  Please see past updates related to this goal by clicking on the "Past Updates" button in the selected goal         The patient verbalized understanding of instructions provided today and agreed to receive a mailed copy of patient instruction and/or educational materials.  Telephone follow up appointment with pharmacy team member scheduled for: 3 months  2023, PharmD Clinical Pharmacist [facility] [phone]   Diabetes Mellitus and Exercise Exercising regularly is important for your overall health, especially when you have diabetes (diabetes mellitus). Exercising is not  only about losing weight. It has many other health benefits, such as increasing muscle strength and bone density and reducing body fat and stress. This leads to improved fitness, flexibility, and endurance, all of which result in better overall health. Exercise has additional benefits for people  with diabetes, including:  Reducing appetite.  Helping to lower and control blood glucose.  Lowering blood pressure.  Helping to control amounts of fatty substances (lipids) in the blood, such as cholesterol and triglycerides.  Helping the body to respond better to insulin (improving insulin sensitivity).  Reducing how much insulin the body needs.  Decreasing the risk for heart disease by: ? Lowering cholesterol and triglyceride levels. ? Increasing the levels of good cholesterol. ? Lowering blood glucose levels. What is my activity plan? Your health care provider or certified diabetes educator can help you make a plan for the type and frequency of exercise (activity plan) that works for you. Make sure that you:  Do at least 150 minutes of moderate-intensity or vigorous-intensity exercise each week. This could be brisk walking, biking, or water aerobics. ? Do stretching and strength exercises, such as yoga or weightlifting, at least 2 times a week. ? Spread out your activity over at least 3 days of the week.  Get some form of physical activity every day. ? Do not go more than 2 days in a row without some kind of physical activity. ? Avoid being inactive for more than 30 minutes at a time. Take frequent breaks to walk or stretch.  Choose a type of exercise or activity that you enjoy, and set realistic goals.  Start slowly, and gradually increase the intensity of your exercise over time. What do I need to know about managing my diabetes?   Check your blood glucose before and after exercising. ? If your blood glucose is 240 mg/dL (87.8 mmol/L) or higher before you exercise, check your urine for ketones. If you have ketones in your urine, do not exercise until your blood glucose returns to normal. ? If your blood glucose is 100 mg/dL (5.6 mmol/L) or lower, eat a snack containing 15-20 grams of carbohydrate. Check your blood glucose 15 minutes after the snack to make sure that your  level is above 100 mg/dL (5.6 mmol/L) before you start your exercise.  Know the symptoms of low blood glucose (hypoglycemia) and how to treat it. Your risk for hypoglycemia increases during and after exercise. Common symptoms of hypoglycemia can include: ? Hunger. ? Anxiety. ? Sweating and feeling clammy. ? Confusion. ? Dizziness or feeling light-headed. ? Increased heart rate or palpitations. ? Blurry vision. ? Tingling or numbness around the mouth, lips, or tongue. ? Tremors or shakes. ? Irritability.  Keep a rapid-acting carbohydrate snack available before, during, and after exercise to help prevent or treat hypoglycemia.  Avoid injecting insulin into areas of the body that are going to be exercised. For example, avoid injecting insulin into: ? The arms, when playing tennis. ? The legs, when jogging.  Keep records of your exercise habits. Doing this can help you and your health care provider adjust your diabetes management plan as needed. Write down: ? Food that you eat before and after you exercise. ? Blood glucose levels before and after you exercise. ? The type and amount of exercise you have done. ? When your insulin is expected to peak, if you use insulin. Avoid exercising at times when your insulin is peaking.  When you start a new exercise or activity, work with  your health care provider to make sure the activity is safe for you, and to adjust your insulin, medicines, or food intake as needed.  Drink plenty of water while you exercise to prevent dehydration or heat stroke. Drink enough fluid to keep your urine clear or pale yellow. Summary  Exercising regularly is important for your overall health, especially when you have diabetes (diabetes mellitus).  Exercising has many health benefits, such as increasing muscle strength and bone density and reducing body fat and stress.  Your health care provider or certified diabetes educator can help you make a plan for the type and  frequency of exercise (activity plan) that works for you.  When you start a new exercise or activity, work with your health care provider to make sure the activity is safe for you, and to adjust your insulin, medicines, or food intake as needed. This information is not intended to replace advice given to you by your health care provider. Make sure you discuss any questions you have with your health care provider. Document Revised: 05/27/2017 Document Reviewed: 04/13/2016 Elsevier Patient Education  2020 [employer].

## 2020-08-28 NOTE — Chronic Care Management (AMB) (Signed)
Chronic Care Management Pharmacy  Name: Catherine [name]  MRN: [id number] DOB: December 29, 1945  Chief Complaint/ HPI  Catherine [name],  74 y.o. , female presents for their Initial CCM visit with the clinical pharmacist via telephone due to COVID-19 Pandemic.  PCP : [name], FNP  Their chronic conditions include: Hypertension, Hyperlipidemia, Diabetes and Hypothyroidism  Office Visits: 08/06/20 OV: Follow up.  Lipid panel, A1c ordered  04/22/20 OV: HTN/DM follow up. Pt has been taking lisinopril 48m 1/2 tablet daily. Would like to start lisinopril 2.5375monce daily due to low BPs. HgbA1c was up at last visit. Trulicity 1.5.1WCeekly was added. HgbA1c down to 7.2% today. Kidney function stable. Encouraged hydration and avoiding NSAIDs due to stage 3 CKD. Liver function, thyroid and vitamin D levels normal.   01/18/20 AWV and OV: BP has been lower and pt has occasional lightheadedness. Decrease lisinopril to 2.75m49maily.   Consult Visits:  CCM Encounters: 02/05/20 PharmD: Comprehensive medication review performed  01/24/20 PharmD: Increase Trulicity to 1.55.8NIekly. Sent in new Rx to [facility] for patient assistance. Discontinue glimepiride after pt finishes current supply.   Medications: Outpatient Encounter Medications as of 08/28/2020  Medication Sig Note  . Accu-Chek FastClix Lancets MISC AS DIRECTED TWICE A DAY SUBCUTANEOUS 90 DAYS E11.9   . Ascorbic Acid (VITAMIN C) 1000 MG tablet Take 1,000 mg by mouth daily.   . aMarland Kitchenpirin 81 MG tablet Take 81 mg by mouth daily.   . aMarland Kitchenorvastatin (LIPITOR) 10 MG tablet TAKE 1 TABLET BY MOUTH EVERY DAY   . beta carotene w/minerals (OCUVITE) tablet Take 1 tablet by mouth daily. 08/17/2018: Uses preservision  . calcium citrate-vitamin D (CITRACAL+D) 315-200 MG-UNIT tablet Take 1 tablet by mouth 2 (two) times daily.   . Cyanocobalamin (VITAMIN B 12 PO) Take 1,000 mg by mouth daily.   . Dulaglutide (TRULICITY) 1.5 MG/DP/8.2UMPN Inject 0.5 mLs (1.5 mg  total) into the skin once a week.   . ferrous sulfate 325 (65 FE) MG tablet Take 1 tablet (325 mg total) by mouth 2 (two) times daily.   . hydrocortisone (ANUSOL-HC) 2.5 % rectal cream PLACE 1 APPLICATION RECTALLY 2 (TWO) TIMES DAILY.   . IMarland Kitchensulin Pen Needle (PEN NEEDLES) 32G X 4 MM MISC 1 each by Does not apply route once a week. Use as directed once per week with Ozempic   . levothyroxine (SYNTHROID) 112 MCG tablet Take 1 tablet (112 mcg total) by mouth daily before breakfast.   . lisinopril (ZESTRIL) 2.5 MG tablet TAKE 1 TABLET BY MOUTH EVERY DAY   . Multiple Vitamin (MULTIVITAMIN WITH MINERALS) TABS tablet Take 1 tablet by mouth daily.   . Multiple Vitamins-Minerals (HAIR SKIN AND NAILS FORMULA) TABS Take 1 tablet by mouth daily.   . mupirocin ointment (BACTROBAN) 2 % APPLY TO AFFECTED AREA TWICE A DAY   . Omega-3 Fatty Acids (FISH OIL) 1000 MG CAPS Take 1 capsule by mouth daily.    . Wheat Dextrin (BENEFIBER DRINK MIX PO) Take by mouth.    No facility-administered encounter medications on file as of 08/28/2020.    Current Diagnosis/Assessment:    Goals Addressed            This Visit's Progress   . Pharmacy Care Plan       CARE PLAN ENTRY (see longitudinal plan of care for additional care plan information)  Current Barriers:  . Chronic Disease Management support, education, and care coordination needs related to Hypertension, Hyperlipidemia, and Diabetes  Hypertension BP Readings from Last 3 Encounters:  08/06/20 124/80  04/22/20 124/70  01/18/20 126/74   . Pharmacist Clinical Goal(s): o Over the next 90 days, patient will work with PharmD and providers to maintain BP goal <130/80 . Current regimen:  o Lisinopril 2.53m daily . Interventions: o Reviewed recent home blood pressure readings o Commended her on her diet and exercise lifestyle changes . Patient self care activities - Over the next 180 days, patient will: o Check BP twice daily, document, and provide at  future appointments o Ensure daily salt intake < 2300 mg/day o Try to exercise for 30 minutes daily 5 times per week (150 minutes per week total)  Hyperlipidemia Lab Results  Component Value Date/Time   LDLCALC 89 08/06/2020 03:02 PM   . Pharmacist Clinical Goal(s): o Over the next 90 days, patient will work with PharmD and providers to achieve LDL goal < 70 . Current regimen:  o Atorvastatin 138mdaily . Interventions: o Provided dietary and exercise recommendations o Discussed appropriate goals for LDL  . Patient self care activities - Over the next 90 days, patient will: o Try to exercise for 30 minutes daily 5 times per week (150 minutes per week total) o Focus on eating a healthy, well-balanced diet   Diabetes Lab Results  Component Value Date/Time   HGBA1C 6.2 (H) 08/06/2020 03:02 PM   HGBA1C 7.2 (H) 04/22/2020 11:38 AM   . Pharmacist Clinical Goal(s): o Over the next 90 days, patient will work with PharmD and providers to maintain A1c goal <7% . Current regimen:  . Trulicity 1.3.4HDeekly . Interventions: o Discussed appropriate goals for fasting blood sugar (80-130) and 2 hours after eating (less than 180) o Will need to re-enroll patient for LiAssurantor 2022 . Patient self care activities - Over the next 90 days, patient will: o Check blood sugar once daily, document, and provide at future appointments o Contact provider with any episodes of hypoglycemia o Try to exercise for 30 minutes daily 5 times per week (150 minutes per week total)  Medication management . Pharmacist Clinical Goal(s): o Over the next 180 days, patient will work with PharmD and providers to maintain optimal medication adherence . [facility] . Interventions o Comprehensive medication review performed. o Continue current medication management strategy . Patient self care activities - Over the next 180 days, patient will: o Focus on medication adherence by continued use of pill  box o Take medications as prescribed o Report any questions or concerns to PharmD and/or provider(s)  Please see past updates related to this goal by clicking on the "Past Updates" button in the selected goal         Diabetes   A1c goal <7%  Recent Relevant Labs: Lab Results  Component Value Date/Time   HGBA1C 6.2 (H) 08/06/2020 03:02 PM   HGBA1C 7.2 (H) 04/22/2020 11:38 AM   MICROALBUR 10 08/06/2020 01:40 PM   MICROALBUR 10 10/05/2019 12:37 PM    Last diabetic Eye exam: No results found for: HMDIABEYEEXA  Last diabetic Foot exam: No results found for: HMDIABFOOTEX   Checking BG: Daily  Recent FBG Readings: 117, 85, 120, 98 Recent pre-meal BG readings:  Recent 2hr PP BG readings: 129, 120, 120,  Recent HS BG readings:   Patient has failed these meds in past: Lantus, metformin, Kombiglyze, Ozempic Patient is currently controlled on the following medications: . Trulicity 1.6.2IWeekly  We discussed:  . [name] Kitchenoals for FBG and 2 hr  PP BG goals (80-130 and <180) . Pt denies BG <70 and symptoms of hypoglycemia . She has stopped the glimepiride . She has made great changes to her diet including decreased late night snacking, also reports some weight loss  Plan Continue current medications, contact providers with any hypoglycemia   Hyperlipidemia   LDL goal < 70  Lipid Panel     Component Value Date/Time   CHOL 160 08/06/2020 1502   TRIG 114 08/06/2020 1502   HDL 50 08/06/2020 1502   LDLCALC 89 08/06/2020 1502    Hepatic Function Latest Ref Rng & Units 08/06/2020 04/22/2020 01/18/2020  Total Protein 6.0 - 8.5 g/dL 6.9 6.5 6.9  Albumin 3.7 - 4.7 g/dL 4.8(H) 4.5 4.5  AST 0 - 40 IU/L '21 21 25  ' ALT 0 - 32 IU/L 35(H) 28 42(H)  Alk Phosphatase 44 - 121 IU/L 88 78 84  Total Bilirubin 0.0 - 1.2 mg/dL 0.5 0.6 0.5     The 10-year ASCVD risk score Mikey Bussing DC Jr., et al., 2013) is: 23.6%   Values used to calculate the score:     Age: 74 years     Sex: Female     Is Non-Hispanic  African American: Yes     Diabetic: Yes     Tobacco smoker: No     Systolic Blood Pressure: 132 mmHg     Is BP treated: Yes     HDL Cholesterol: 50 mg/dL     Total Cholesterol: 160 mg/dL   Patient has failed these meds in past: N/A Patient is currently controlled on the following medications:  . Atorvastatin 18m daily  We discussed:   . Diet extensively o She has stopped the late night snacking o TG's WNL at last lipid panel . Exercise extensively o Recommend pt get 30 minutes of moderate intensity exercise daily 5 times a week (150 minutes total per week) She requests a refill on her [facility].  Plan Continue current medications   Hypertension   BP goal is:  <130/80  Office blood pressures are  BP Readings from Last 3 Encounters:  08/06/20 124/80  04/22/20 124/70  01/18/20 126/74   Patient checks BP at home twice daily Patient home BP readings are ranging: 123-50 P 65, 122/57 P 66  Patient has failed these meds in the past: Lisinopril/HCTZ Patient is currently controlled on the following medications:  . Lisinopril 2.527mdaily  We discussed: . Denies lightheadedness and dizziness o She is now on the decreased dose of 2.27m60maily Plan Continue current medications , continue home monitoring plan  Vaccines   Reviewed and discussed patient's vaccination history.    Immunization History  Administered Date(s) Administered  . Influenza,inj,Quad PF,6+ Mos 08/16/2014  . Influenza-Unspecified 08/08/2018, 08/11/2019  . PFIZER SARS-COV-2 Vaccination 12/21/2019, 01/11/2020  . Pneumococcal Polysaccharide-23 08/24/2019  . Tdap 09/18/2019    Plan  Recommended patient receive flu and Prevnar 13 vaccine in  office.   Follow up: 3 month phone visit  ChrBeverly MilchharmD Clinical Pharmacist [phone]

## 2020-09-09 DIAGNOSIS — Z0100 Encounter for examination of eyes and vision without abnormal findings: Secondary | ICD-10-CM | POA: Diagnosis not present

## 2020-09-09 DIAGNOSIS — E119 Type 2 diabetes mellitus without complications: Secondary | ICD-10-CM | POA: Diagnosis not present

## 2020-09-10 ENCOUNTER — Other Ambulatory Visit: Payer: Self-pay | Admitting: Nurse Practitioner

## 2020-09-10 ENCOUNTER — Telehealth: Payer: Self-pay

## 2020-09-10 DIAGNOSIS — Z23 Encounter for immunization: Secondary | ICD-10-CM

## 2020-09-10 MED ORDER — PNEUMOCOCCAL 13-VAL CONJ VACC IM SUSP
0.5000 mL | INTRAMUSCULAR | 0 refills | Status: AC
Start: 2020-09-10 — End: 2020-09-10

## 2020-09-10 NOTE — Telephone Encounter (Signed)
I have sent the Rx to the pharmacy for prevnar 13, she is to also not take the covid vaccine within 2 weeks.of having the pnuemonia vaccine

## 2020-09-10 NOTE — Telephone Encounter (Signed)
Pt wants to know is it time for her pneumonia shot, if so can we send it to the pharmacy

## 2020-09-13 ENCOUNTER — Other Ambulatory Visit: Payer: Self-pay

## 2020-09-13 ENCOUNTER — Ambulatory Visit (INDEPENDENT_AMBULATORY_CARE_PROVIDER_SITE_OTHER): Payer: Medicare Other

## 2020-09-13 VITALS — BP 120/74 | HR 79 | Temp 97.7°F | Wt 166.8 lb

## 2020-09-13 DIAGNOSIS — Z23 Encounter for immunization: Secondary | ICD-10-CM | POA: Diagnosis not present

## 2020-09-13 NOTE — Progress Notes (Signed)
Patient presented today for a flu shot. YL,RMA

## 2020-10-07 ENCOUNTER — Other Ambulatory Visit: Payer: Self-pay | Admitting: Nurse Practitioner

## 2020-10-09 ENCOUNTER — Encounter: Payer: Medicare Other | Admitting: Nurse Practitioner

## 2020-10-18 ENCOUNTER — Telehealth: Payer: Self-pay

## 2020-10-18 NOTE — Chronic Care Management (AMB) (Signed)
Chronic Care Management Pharmacy Assistant   Name: Catherine [name]  MRN: [id number] DOB: [birth year]  Reason for Encounter: Disease State- Diabetes, Hypertension and Lipid-Adherence Call.  Patient Questions:  1.  Have you seen any other providers since your last visit? No   2.  Any changes in your medicines or health? No   PCP : [name], FNP  Allergies:  No Known Allergies  Medications: Outpatient Encounter Medications as of 10/18/2020  Medication Sig Note  . Accu-Chek FastClix Lancets MISC AS DIRECTED TWICE A DAY SUBCUTANEOUS 90 DAYS E11.9   . Ascorbic Acid (VITAMIN C) 1000 MG tablet Take 1,000 mg by mouth daily.   [name] Kitchen aspirin 81 MG tablet Take 81 mg by mouth daily.   [name] Kitchen atorvastatin (LIPITOR) 10 MG tablet TAKE 1 TABLET BY MOUTH EVERY DAY   . beta carotene w/minerals (OCUVITE) tablet Take 1 tablet by mouth daily. 08/17/2018: Uses preservision  . calcium citrate-vitamin D (CITRACAL+D) 315-200 MG-UNIT tablet Take 1 tablet by mouth 2 (two) times daily.   . Cyanocobalamin (VITAMIN B 12 PO) Take 1,000 mg by mouth daily.   . Dulaglutide (TRULICITY) 1.5 MG/0.5ML SOPN Inject 0.5 mLs (1.5 mg total) into the skin once a week.   . ferrous sulfate 325 (65 FE) MG tablet Take 1 tablet (325 mg total) by mouth 2 (two) times daily.   . hydrocortisone (ANUSOL-HC) 2.5 % rectal cream PLACE 1 APPLICATION RECTALLY 2 (TWO) TIMES DAILY.   [name] Kitchen Insulin Pen Needle (PEN NEEDLES) 32G X 4 MM MISC 1 each by Does not apply route once a week. Use as directed once per week with Ozempic   . levothyroxine (SYNTHROID) 112 MCG tablet Take 1 tablet (112 mcg total) by mouth daily before breakfast.   . lisinopril (ZESTRIL) 2.5 MG tablet TAKE 1 TABLET BY MOUTH EVERY DAY   . Multiple Vitamin (MULTIVITAMIN WITH MINERALS) TABS tablet Take 1 tablet by mouth daily.   . Multiple Vitamins-Minerals (HAIR SKIN AND NAILS FORMULA) TABS Take 1 tablet by mouth daily.   . mupirocin ointment (BACTROBAN) 2 % APPLY TO AFFECTED AREA  TWICE A DAY   . Omega-3 Fatty Acids (FISH OIL) 1000 MG CAPS Take 1 capsule by mouth daily.    . Wheat Dextrin (BENEFIBER DRINK MIX PO) Take by mouth.    No facility-administered encounter medications on file as of 10/18/2020.    Current Diagnosis: Patient Active Problem List   Diagnosis Date Noted  . Type 2 diabetes mellitus without complication, without long-term current use of insulin (HCC) 02/06/2019  . Essential hypertension 02/06/2019  . Hordeolum externum of left upper eyelid 12/14/2018  . Benign hypertension with chronic kidney disease, stage III (HCC) 08/03/2018  . Diabetes type 2, controlled 08/23/2014  . Anemia 08/23/2014  . Hypokalemia 08/23/2014  . CAP (community acquired pneumonia)--treated 08/23/2014  . Physical debility 08/16/2014  . Pneumonia 08/13/2014  . DKA, type 2 (HCC) 08/13/2014  . Acute renal failure (HCC) 08/13/2014  . Hypernatremia 08/13/2014  . Elevated LFTs 08/13/2014  . Acquired hypothyroidism 08/13/2014  . DKA (diabetic ketoacidoses) 08/13/2014   Reviewed chart prior to disease state call. Spoke with patient regarding BP  Recent Office Vitals: BP Readings from Last 3 Encounters:  09/13/20 120/74  08/06/20 124/80  04/22/20 124/70   Pulse Readings from Last 3 Encounters:  09/13/20 79  08/06/20 73  04/22/20 76    Wt Readings from Last 3 Encounters:  09/13/20 166 lb 12.8 oz (75.7 kg)  08/06/20 166 lb 9.6 oz (  75.6 kg)  04/22/20 170 lb (77.1 kg)     Kidney Function Lab Results  Component Value Date/Time   CREATININE 1.32 (H) 08/06/2020 03:02 PM   CREATININE 1.36 (H) 04/22/2020 11:38 AM   CREATININE 1.79 (H) 12/21/2017 11:26 AM   GFRNONAA 40 (L) 08/06/2020 03:02 PM   GFRNONAA 28 (L) 12/21/2017 11:26 AM   GFRAA 46 (L) 08/06/2020 03:02 PM   GFRAA 32 (L) 12/21/2017 11:26 AM    BMP Latest Ref Rng & Units 08/06/2020 04/22/2020 01/18/2020  Glucose 65 - 99 mg/dL 696(E126(H) 952(W186(H) 413(K212(H)  BUN 8 - 27 mg/dL 19 21 18   Creatinine 0.57 - 1.00 mg/dL 4.40(N1.32(H)  0.27(O1.36(H) 5.36(U1.37(H)  BUN/Creat Ratio 12 - 28 14 15 13   Sodium 134 - 144 mmol/L 141 138 140  Potassium 3.5 - 5.2 mmol/L 5.1 4.6 4.4  Chloride 96 - 106 mmol/L 105 102 103  CO2 20 - 29 mmol/L 24 22 22   Calcium 8.7 - 10.3 mg/dL 9.9 9.7 44.010.1    . Current antihypertensive regimen:  o Lisinopril 2.5mg  daily o  . How often are you checking your Blood Pressure?   Patient is taking her blood pressure daily.  . Current home BP readings:   10/31/20 120/50  10/30/20 113/64  10/29/20 119/68  10/28/20 120/65  . What recent interventions/DTPs have been made by any provider to improve Blood Pressure control since last CPP Visit:  Patient stated she is taking her medication as directed per provider.   . Any recent hospitalizations or ED visits since last visit with CPP?No  . What diet changes have been made to improve Blood Pressure Control?  o Patient stated she is watching her salt intake. o Patient is eating more baked foods. . What exercise is being done to improve your Blood Pressure Control?  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  Adherence Review: Is the patient currently on ACE/ARB medication? Yes Does the patient have >5 day gap between last estimated fill dates? No   Recent Relevant Labs: Lab Results  Component Value Date/Time   HGBA1C 6.2 (H) 08/06/2020 03:02 PM   HGBA1C 7.2 (H) 04/22/2020 11:38 AM   MICROALBUR 10 08/06/2020 01:40 PM   MICROALBUR 10 10/05/2019 12:37 PM    Kidney Function Lab Results  Component Value Date/Time   CREATININE 1.32 (H) 08/06/2020 03:02 PM   CREATININE 1.36 (H) 04/22/2020 11:38 AM   CREATININE 1.79 (H) 12/21/2017 11:26 AM   GFRNONAA 40 (L) 08/06/2020 03:02 PM   GFRNONAA 28 (L) 12/21/2017 11:26 AM   GFRAA 46 (L) 08/06/2020 03:02 PM   GFRAA 32 (L) 12/21/2017 11:26 AM    . Current antihyperglycemic regimen:   Trulicity 1.5 mg weekly . What recent interventions/DTPs have been made to improve glycemic control:   o Patient states she is taking her medications as directed per provider.  . Have there been any recent hospitalizations or ED visits since last visit with CPP?  [name] Kitchen. Patient denies hypoglycemic symptoms, including Pale, Sweaty, Shaky, Hungry, Nervous/irritable and Vision changes . Patient denies hyperglycemic symptoms, including blurry vision, excessive thirst, fatigue, polyuria and weakness . How often are you checking your blood sugar? daily.  . What are your blood sugars ranging?  o Fasting: 10/31/2020 107 o Fasting: 10/30/2020 127 o Fasting: 10/29/2020 113 o Fasting: 10/28/2020 139 o Fasting: 10/27/2020 91 o Before meals: None o After meals: None o Bedtime: None  . During the week, how often does your blood  glucose drop below 70? None.  Are you checking your feet daily/regularly? Yes, Patient denies any open sores , numbness, tingling, and no pain.  Adherence Review: Is the patient currently on a STATIN medication? Yes Is the patient currently on ACE/ARB medication? Yes Does the patient have >5 day gap between last estimated fill dates? No    Comprehensive medication review performed; Spoke to patient regarding cholesterol  Lipid Panel    Component Value Date/Time   CHOL 160 08/06/2020 1502   TRIG 114 08/06/2020 1502   HDL 50 08/06/2020 1502   LDLCALC 89 08/06/2020 1502    10-year ASCVD risk score: The 10-year ASCVD risk score Denman George DC Montez Hageman., et al., 2013) is: 22.4%   Values used to calculate the score:     Age: 74 years     Sex: Female     Is Non-Hispanic African American: Yes     Diabetic: Yes     Tobacco smoker: No     Systolic Blood Pressure: 120 mmHg     Is BP treated: Yes     HDL Cholesterol: 50 mg/dL     Total Cholesterol: 160 mg/dL  . Current antihyperlipidemic regimen:  o Atorvastatin 10 mg daily  . Previous antihyperlipidemic medications tried: None.  . ASCVD risk enhancing conditions: age >29, DM and HTN   . What recent interventions/DTPs have been  made by any provider to improve Cholesterol control since last CPP Visit: Patient stated she is taking her medication as directed per provider.  . Any recent hospitalizations or ED visits since last visit with CPP? No  . What diet changes have been made to improve Cholesterol?  o Patient stated she is watching her salt intake. o Patient is eating more baked foods.  . What exercise is being done to improve Cholesterol?  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  Adherence Review: Does the patient have >5 day gap between last estimated fill dates? No       Goals Addressed            This Visit's Progress   . Pharmacy Care Plan   On track    CARE PLAN ENTRY (see longitudinal plan of care for additional care plan information)  Current Barriers:  . Chronic Disease Management support, education, and care coordination needs related to Hypertension, Hyperlipidemia, and Diabetes   Hypertension BP Readings from Last 3 Encounters:  08/06/20 124/80  04/22/20 124/70  01/18/20 126/74   . Pharmacist Clinical Goal(s): o Over the next 90 days, patient will work with PharmD and providers to maintain BP goal <130/80 . Current regimen:  o Lisinopril 2.5mg  daily . Interventions: o Reviewed recent home blood pressure readings o Commended her on her diet and exercise lifestyle changes . Patient self care activities - Over the next 180 days, patient will: o Check BP twice daily, document, and provide at future appointments o Ensure daily salt intake < 2300 mg/day o Try to exercise for 30 minutes daily 5 times per week (150 minutes per week total)  Hyperlipidemia Lab Results  Component Value Date/Time   LDLCALC 89 08/06/2020 03:02 PM   . Pharmacist Clinical Goal(s): o Over the next 90 days, patient will work with PharmD and providers to achieve LDL goal < 70 . Current regimen:  o Atorvastatin 10mg  daily . Interventions: o Provided dietary and  exercise recommendations o Discussed appropriate goals for LDL  . Patient self care activities - Over  the next 90 days, patient will: o Try to exercise for 30 minutes daily 5 times per week (150 minutes per week total) o Focus on eating a healthy, well-balanced diet   Diabetes Lab Results  Component Value Date/Time   HGBA1C 6.2 (H) 08/06/2020 03:02 PM   HGBA1C 7.2 (H) 04/22/2020 11:38 AM   . Pharmacist Clinical Goal(s): o Over the next 90 days, patient will work with PharmD and providers to maintain A1c goal <7% . Current regimen:  . Trulicity 1.5mg  weekly . Interventions: o Discussed appropriate goals for fasting blood sugar (80-130) and 2 hours after eating (less than 180) o Will need to re-enroll patient for [employer] for 2022 . Patient self care activities - Over the next 90 days, patient will: o Check blood sugar once daily, document, and provide at future appointments o Contact provider with any episodes of hypoglycemia o Try to exercise for 30 minutes daily 5 times per week (150 minutes per week total)  Medication management . Pharmacist Clinical Goal(s): o Over the next 180 days, patient will work with PharmD and providers to maintain optimal medication adherence . Current pharmacy: [facility] . Interventions o Comprehensive medication review performed. o Continue current medication management strategy . Patient self care activities - Over the next 180 days, patient will: o Focus on medication adherence by continued use of pill box o Take medications as prescribed o Report any questions or concerns to PharmD and/or provider(s)  Please see past updates related to this goal by clicking on the "Past Updates" button in the selected goal         Follow-Up:  Pharmacist Review  New patient assistance application form filled out to [employer], for [employer]. Waiting for provider and patient's signature.    Cherylin Mylar, CPP. Notified  Jon Gills, [facility] Clinical  Pharmacist Assistant [phone]

## 2020-11-01 ENCOUNTER — Encounter: Payer: Self-pay | Admitting: Nurse Practitioner

## 2020-11-05 ENCOUNTER — Other Ambulatory Visit: Payer: Self-pay

## 2020-11-05 ENCOUNTER — Ambulatory Visit (INDEPENDENT_AMBULATORY_CARE_PROVIDER_SITE_OTHER): Payer: Medicare Other | Admitting: Nurse Practitioner

## 2020-11-05 ENCOUNTER — Encounter: Payer: Self-pay | Admitting: Nurse Practitioner

## 2020-11-05 VITALS — BP 124/68 | HR 72 | Temp 97.6°F | Ht 64.6 in | Wt 173.4 lb

## 2020-11-05 DIAGNOSIS — I10 Essential (primary) hypertension: Secondary | ICD-10-CM | POA: Diagnosis not present

## 2020-11-05 DIAGNOSIS — E663 Overweight: Secondary | ICD-10-CM

## 2020-11-05 DIAGNOSIS — Z6829 Body mass index (BMI) 29.0-29.9, adult: Secondary | ICD-10-CM

## 2020-11-05 DIAGNOSIS — E782 Mixed hyperlipidemia: Secondary | ICD-10-CM | POA: Diagnosis not present

## 2020-11-05 DIAGNOSIS — E119 Type 2 diabetes mellitus without complications: Secondary | ICD-10-CM | POA: Diagnosis not present

## 2020-11-05 DIAGNOSIS — E559 Vitamin D deficiency, unspecified: Secondary | ICD-10-CM | POA: Diagnosis not present

## 2020-11-05 DIAGNOSIS — E039 Hypothyroidism, unspecified: Secondary | ICD-10-CM | POA: Diagnosis not present

## 2020-11-05 NOTE — Progress Notes (Signed)
I,[name],acting as a Education administrator for [employer], FNP.,have documented all relevant documentation on the behalf of [name], FNP,as directed by  [name], FNP while in the presence of [name], [place].  This visit occurred during the SARS-CoV-2 public health emergency.  Safety protocols were in place, including screening questions prior to the visit, additional usage of staff PPE, and extensive cleaning of exam room while observing appropriate contact time as indicated for disinfecting solutions.  Subjective:     Patient ID: Catherine [name] , female    DOB: 08/07/1946 , 74 y.o.   MRN: [id number]   Chief Complaint  Patient presents with  . Diabetes  . Hypertension    HPI  Patient is here for bp dm check. She reports compliance with medication. She has no concerns at this time.  Blood pressure ranging 109/4/-126/60.  She has had a visit with [place] with normal findings on ABI and eye exam, she needs to go for her routine eye exam.   Wt Readings from Last 3 Encounters: 11/05/20 : 173 lb 6.4 oz (78.7 kg) 09/13/20 : 166 lb 12.8 oz (75.7 kg) 08/06/20 : 166 lb 9.6 oz (75.6 kg)   Diabetes She presents for her follow-up diabetic visit. She has type 2 diabetes mellitus. Her disease course has been stable. There are no hypoglycemic associated symptoms. Pertinent negatives for hypoglycemia include no confusion, dizziness, headaches or nervousness/anxiousness. Pertinent negatives for diabetes include no blurred vision, no chest pain, no fatigue, no polydipsia, no polyphagia and no polyuria. There are no hypoglycemic complications. Symptoms are stable. There are no diabetic complications. Pertinent negatives for diabetic complications include no CVA. Risk factors for coronary artery disease include diabetes mellitus and sedentary lifestyle. Current diabetic treatment includes oral agent (dual therapy). She is compliant with treatment all of the time. Her weight is decreasing  steadily. She is following a diabetic diet. When asked about meal planning, she reported none. She has not had a previous visit with a dietitian. She rarely participates in exercise. (Blood sugar readings range  91-157) An ACE inhibitor/angiotensin II receptor blocker is being taken. She does not see a podiatrist.Eye exam is not current.  Hypertension This is a chronic problem. The current episode started more than 1 year ago. The problem is unchanged. The problem is controlled. Pertinent negatives include no anxiety, blurred vision, chest pain, headaches or palpitations. There are no associated agents to hypertension. Risk factors for coronary artery disease include obesity and sedentary lifestyle. Past treatments include ACE inhibitors. The current treatment provides significant improvement. There are no compliance problems.  There is no history of CVA. There is no history of chronic renal disease.     Past Medical History:  Diagnosis Date  . Diabetes mellitus without complication ([facility])   . Hypertension   . Thyroid disease      Family History  Problem Relation Age of Onset  . Diabetes Mellitus II Mother   . Stroke Mother   . Diabetes Mellitus II Sister   . Hypertension Sister      Current Outpatient Medications:  .  Accu-Chek FastClix Lancets MISC, AS DIRECTED TWICE A DAY SUBCUTANEOUS 90 DAYS E11.9, Disp: 204 each, Rfl: 4 .  Ascorbic Acid (VITAMIN C) 1000 MG tablet, Take 1,000 mg by mouth daily., Disp: , Rfl:  .  aspirin 81 MG tablet, Take 81 mg by mouth daily., Disp: , Rfl:  .  atorvastatin (LIPITOR) 10 MG tablet, TAKE 1 TABLET BY MOUTH EVERY DAY, Disp: 90  tablet, Rfl: 0 .  beta carotene w/minerals (OCUVITE) tablet, Take 1 tablet by mouth daily., Disp: , Rfl:  .  calcium citrate-vitamin D (CITRACAL+D) 315-200 MG-UNIT tablet, Take 1 tablet by mouth 2 (two) times daily., Disp: , Rfl:  .  Cyanocobalamin (VITAMIN B 12 PO), Take 1,000 mg by mouth daily., Disp: , Rfl:  .  Dulaglutide  (TRULICITY) 1.5 WY/6.3ZC SOPN, Inject 0.5 mLs (1.5 mg total) into the skin once a week., Disp: 12 pen, Rfl: 1 .  ferrous sulfate 325 (65 FE) MG tablet, Take 1 tablet (325 mg total) by mouth 2 (two) times daily., Disp: 180 tablet, Rfl: 1 .  hydrocortisone (ANUSOL-HC) 2.5 % rectal cream, PLACE 1 APPLICATION RECTALLY 2 (TWO) TIMES DAILY., Disp: 30 g, Rfl: 2 .  Insulin Pen Needle (PEN NEEDLES) 32G X 4 MM MISC, 1 each by Does not apply route once a week. Use as directed once per week with Ozempic, Disp: 30 each, Rfl: 2 .  levothyroxine (SYNTHROID) 112 MCG tablet, Take 1 tablet (112 mcg total) by mouth daily before breakfast., Disp: 90 tablet, Rfl: 0 .  lisinopril (ZESTRIL) 2.5 MG tablet, TAKE 1 TABLET BY MOUTH EVERY DAY, Disp: 90 tablet, Rfl: 1 .  Multiple Vitamin (MULTIVITAMIN WITH MINERALS) TABS tablet, Take 1 tablet by mouth daily., Disp: , Rfl:  .  Multiple Vitamins-Minerals (HAIR SKIN AND NAILS FORMULA) TABS, Take 1 tablet by mouth daily., Disp: , Rfl:  .  mupirocin ointment (BACTROBAN) 2 %, APPLY TO AFFECTED AREA TWICE A DAY, Disp: 22 g, Rfl: 0 .  Omega-3 Fatty Acids (FISH OIL) 1000 MG CAPS, Take 1 capsule by mouth daily. , Disp: , Rfl:  .  Wheat Dextrin (BENEFIBER DRINK MIX PO), Take by mouth., Disp: , Rfl:    No Known Allergies   Review of Systems  Constitutional: Negative.  Negative for fatigue.  Eyes: Negative for blurred vision.  Respiratory: Negative.   Cardiovascular: Negative.  Negative for chest pain and palpitations.  Gastrointestinal: Negative.   Endocrine: Negative for polydipsia, polyphagia and polyuria.  Neurological: Negative.  Negative for dizziness and headaches.  Psychiatric/Behavioral: Negative for confusion. The patient is not nervous/anxious.      Today's Vitals   11/05/20 1105  BP: 124/68  Pulse: 72  Temp: 97.6 F (36.4 C)  TempSrc: Oral  Weight: 173 lb 6.4 oz (78.7 kg)  Height: 5' 4.6" (1.641 m)   Body mass index is 29.21 kg/m.  Wt Readings from Last 3  Encounters:  11/05/20 173 lb 6.4 oz (78.7 kg)  09/13/20 166 lb 12.8 oz (75.7 kg)  08/06/20 166 lb 9.6 oz (75.6 kg)     Objective:  Physical Exam Vitals reviewed.  Constitutional:      General: She is not in acute distress.    Appearance: Normal appearance. She is well-developed.  HENT:     Head: Normocephalic and atraumatic.  Eyes:     Pupils: Pupils are equal, round, and reactive to light.  Cardiovascular:     Rate and Rhythm: Normal rate and regular rhythm.     Pulses: Normal pulses.     Heart sounds: Normal heart sounds. No murmur heard.   Pulmonary:     Effort: Pulmonary effort is normal. No respiratory distress.     Breath sounds: Normal breath sounds. No wheezing.  Musculoskeletal:        General: Normal range of motion.  Skin:    General: Skin is warm and dry.     Capillary Refill: Capillary refill  takes less than 2 seconds.  Neurological:     General: No focal deficit present.     Mental Status: She is alert and oriented to person, place, and time.     Cranial Nerves: No cranial nerve deficit.  Psychiatric:        Mood and Affect: Mood normal.        Behavior: Behavior normal.        Thought Content: Thought content normal.        Judgment: Judgment normal.         Assessment And Plan:     1. Type 2 diabetes mellitus without complication, without long-term current use of insulin (HCC)  Chronic, blood sugars are better controlled  Will check HgbA1c  She had assistance with her patient assistance forms - BMP8+eGFR - Hemoglobin A1c  2. Essential hypertension  Chronic, well controlled  Continue with current medications  Will check GFR for kidney functions - BMP8+eGFR  3. Overweight with body mass index (BMI) of 29 to 29.9 in adult  Her weight has increased approximately 5 lbs since her last visit  She feels this is related to caring for her husband and not having as much time to exercise  She is encouraged to strive for BMI less than 26 to  decrease cardiac risk. Advised to aim for at least 150 minutes of exercise per week.  4. Mixed hyperlipidemia  Chronic, controlled  Continue with current medications, tolerating medications well - Lipid panel  5. Acquired hypothyroidism  Chronic, stable  Continue with current medications - T4 - T3, free - TSH  6. Vitamin D deficiency  Will check vitamin D level and supplement as needed.     Also encouraged to spend 15 minutes in the sun daily.  - VITAMIN D 25 Hydroxy (Vit-D Deficiency, Fractures)     Patient was given opportunity to ask questions. Patient verbalized understanding of the plan and was able to repeat key elements of the plan. All questions were answered to their satisfaction.  [name], FNP   I, [name], FNP, have reviewed all documentation for this visit. The documentation on 11/10/20 for the exam, diagnosis, procedures, and orders are all accurate and complete.   THE PATIENT IS ENCOURAGED TO PRACTICE SOCIAL DISTANCING DUE TO THE COVID-19 PANDEMIC.

## 2020-11-05 NOTE — Progress Notes (Signed)
11/05/2020- Patient came into office with new form from [facility] for Trulicity, patient signed form and brought income documentation. Will have provider to sign and will fax to [employer].  11/06/2020- Patient assistance forms faxed to [facility].  Billee Cashing, CMA Clinical Pharmacist Assistant [phone]

## 2020-11-05 NOTE — Patient Instructions (Signed)

## 2020-11-06 LAB — BMP8+EGFR
BUN/Creatinine Ratio: 13 (ref 12–28)
BUN: 17 mg/dL (ref 8–27)
CO2: 24 mmol/L (ref 20–29)
Calcium: 9.8 mg/dL (ref 8.7–10.3)
Chloride: 104 mmol/L (ref 96–106)
Creatinine, Ser: 1.28 mg/dL — ABNORMAL HIGH (ref 0.57–1.00)
GFR calc Af Amer: 48 mL/min/{1.73_m2} — ABNORMAL LOW (ref >59)
GFR calc non Af Amer: 42 mL/min/{1.73_m2} — ABNORMAL LOW (ref >59)
Glucose: 141 mg/dL — ABNORMAL HIGH (ref 65–99)
Potassium: 4.5 mmol/L (ref 3.5–5.2)
Sodium: 141 mmol/L (ref 134–144)

## 2020-11-06 LAB — HEMOGLOBIN A1C
Est. average glucose Bld gHb Est-mCnc: 160 mg/dL
Hgb A1c MFr Bld: 7.2 % — ABNORMAL HIGH (ref 4.8–5.6)

## 2020-11-06 LAB — LIPID PANEL
Chol/HDL Ratio: 2.6 ratio (ref 0.0–4.4)
Cholesterol, Total: 166 mg/dL (ref 100–199)
HDL: 63 mg/dL (ref >39)
LDL Chol Calc (NIH): 88 mg/dL (ref 0–99)
Triglycerides: 77 mg/dL (ref 0–149)
VLDL Cholesterol Cal: 15 mg/dL (ref 5–40)

## 2020-11-06 LAB — VITAMIN D 25 HYDROXY (VIT D DEFICIENCY, FRACTURES): Vit D, 25-Hydroxy: 33.6 ng/mL (ref 30.0–100.0)

## 2020-11-06 LAB — TSH: TSH: 1.5 u[IU]/mL (ref 0.450–4.500)

## 2020-11-06 LAB — T3, FREE: T3, Free: 2.3 pg/mL (ref 2.0–4.4)

## 2020-11-06 LAB — T4: T4, Total: 8.3 ug/dL (ref 4.5–12.0)

## 2020-11-10 ENCOUNTER — Encounter: Payer: Self-pay | Admitting: Nurse Practitioner

## 2020-11-13 ENCOUNTER — Ambulatory Visit: Payer: Medicare Other

## 2020-11-13 ENCOUNTER — Other Ambulatory Visit: Payer: Self-pay

## 2020-11-13 LAB — HM DIABETES EYE EXAM

## 2020-11-23 ENCOUNTER — Other Ambulatory Visit: Payer: Self-pay | Admitting: Nurse Practitioner

## 2020-12-02 ENCOUNTER — Telehealth: Payer: Federal, State, Local not specified - PPO

## 2020-12-02 NOTE — Progress Notes (Signed)
No, I do not want her to start that back we will recheck at her next visit.

## 2020-12-09 ENCOUNTER — Telehealth: Payer: Self-pay

## 2020-12-09 NOTE — Progress Notes (Signed)
12/09/2020 ° °Called patient to remind her of appointment with Vallie Pearson,CPP on 12/10/2020. °Patient is aware to have all Medications and supplements available at time of appointment ° °Vallie Pearson,CPP Notified ° °Veronica Foster, CCMA °Clinical Pharmacist Assistant °[phone]  ° °

## 2020-12-10 ENCOUNTER — Ambulatory Visit: Payer: Medicare Other

## 2020-12-10 DIAGNOSIS — E119 Type 2 diabetes mellitus without complications: Secondary | ICD-10-CM

## 2020-12-10 DIAGNOSIS — E782 Mixed hyperlipidemia: Secondary | ICD-10-CM

## 2020-12-10 DIAGNOSIS — D649 Anemia, unspecified: Secondary | ICD-10-CM

## 2020-12-10 NOTE — Patient Instructions (Signed)
Visit Information  Goals Addressed            This Visit's Progress   . Pharmacy Care Plan       CARE PLAN ENTRY (see longitudinal plan of care for additional care plan information)  Current Barriers:  . Chronic Disease Management support, education, and care coordination needs related to Hypertension, Hyperlipidemia, and Diabetes   Hypertension BP Readings from Last 3 Encounters:  11/05/20 124/68  09/13/20 120/74  08/06/20 124/80   . Pharmacist Clinical Goal(s): o Over the next 90 days, patient will work with PharmD and providers to maintain BP goal <130/80 . Current regimen:  o Lisinopril 2.5mg  daily . Interventions: o Reviewed recent home blood pressure readings o Commended her on her diet and exercise lifestyle changes . Patient self care activities - Over the next 180 days, patient will: o Check BP twice daily, document, and provide at future appointments o Ensure daily salt intake < 2300 mg/day o Try to exercise for 30 minutes daily 5 times per week (150 minutes per week total)  Hyperlipidemia Lab Results  Component Value Date/Time   LDLCALC 88 11/05/2020 12:51 PM   . Pharmacist Clinical Goal(s): o Over the next 90 days, patient will work with PharmD and providers to achieve LDL goal < 70 . Current regimen:  o Atorvastatin 10mg  daily . Interventions: o Provided dietary and exercise recommendations o Discussed appropriate goals for LDL  . Patient self care activities - Over the next 90 days, patient will: o Try to exercise for 30 minutes daily 5 times per week (150 minutes per week total) o Focus on eating a healthy, well-balanced diet   Diabetes Lab Results  Component Value Date/Time   HGBA1C 7.2 (H) 11/05/2020 12:51 PM   HGBA1C 6.2 (H) 08/06/2020 03:02 PM   . Pharmacist Clinical Goal(s): o Over the next 90 days, patient will work with PharmD and providers to achieve A1c goal <7% . Current regimen:  . Trulicity 1.5mg  weekly on Tuesdays   . Interventions: o Discussed appropriate goals for fasting blood sugar (80-130) and 2 hours after eating (less than 180) o Will need to re-enroll patient for 05-24-1972 for 2022 . Patient self care activities - Over the next 90 days, patient will: o Check blood sugar twice daily, document, and provide at future appointments o Contact provider with any episodes of hypoglycemia o Try to exercise for 30 minutes daily 5 times per week (150 minutes per week total)  Medication management . Pharmacist Clinical Goal(s): o Over the next 180 days, patient will work with PharmD and providers to maintain optimal medication adherence . [facility] . Interventions o Comprehensive medication review performed. o Continue current medication management strategy . Patient self care activities - Over the next 180 days, patient will: o Focus on medication adherence by continued use of pill box o Take medications as prescribed o Report any questions or concerns to PharmD and/or provider(s)  Please see past updates related to this goal by clicking on the "Past Updates" button in the selected goal         The patient verbalized understanding of instructions, educational materials, and care plan provided today and agreed to receive a mailed copy of patient instructions, educational materials, and care plan.   The pharmacy team will reach out to the patient again over the next 90 days.   2023, [facility]

## 2020-12-10 NOTE — Chronic Care Management (AMB) (Signed)
Chronic Care Management Pharmacy  Name: Catherine [name]  MRN: [id number] DOB: 07-02-1946  Chief Complaint/ HPI  Catherine [name],  75 y.o. , female presents for their Follow-Up CCM visit with the clinical pharmacist via telephone due to COVID-19 Pandemic.Patient is taking care of her husband who is recovering from open heart surgery and she is always watching him. Her husband had surgery in April, was in the hospital for three months and then he went to rehab and came home on August 13 th but he still has a way to go. They have been married for 53 years. She reports that taking care of her husband is a full time job. She works hard to be patient to help him recover. She reports that surgery was life changing. She reports at night sometimes she does not get any sleep.   PCP : [name], FNP  Their chronic conditions include: Hypertension, Hyperlipidemia, Diabetes and Hypothyroidism  Office Visits:   08/06/20 OV: Recommend intake of daily multivitamin, Vitamin D, and calcium. Recommend mammogram and colonoscopy for preventive screenings, as well as recommend immunizations that include influenza, TDAP, ferrous sulfate 325 (65 FE) MG tablet; Take 1 tablet (325 mg total) by mouth 2 (two) times daily.  Dispense: 180 tablet; Refill: 1    04/22/20 OV: HTN/DM follow up. Pt has been taking lisinopril 82m 1/2 tablet daily. Would like to start lisinopril 2.5480monce daily due to low BPs. HgbA1c was up at last visit. Trulicity 1.9.3TTeekly was added. HgbA1c down to 7.2% today. Kidney function stable. Encouraged hydration and avoiding NSAIDs due to stage 3 CKD. Liver function, thyroid and vitamin D levels normal.   01/18/20 AWV and OV: BP has been lower and pt has occasional lightheadedness. Decrease lisinopril to 2.80m69maily.   Consult Visits:  CCM Encounters: 02/05/20 PharmD: Comprehensive medication review performed  01/24/20 PharmD: Increase Trulicity to 1.50.1XBekly. Sent in new Rx to [facility] for patient assistance. Discontinue glimepiride after pt finishes current supply.   Medications: Outpatient Encounter Medications as of 12/10/2020  Medication Sig Note  . Accu-Chek FastClix Lancets MISC AS DIRECTED TWICE A DAY SUBCUTANEOUS 90 DAYS E11.9   . Ascorbic Acid (VITAMIN C) 1000 MG tablet Take 1,000 mg by mouth daily.   . aMarland Kitchenpirin 81 MG tablet Take 81 mg by mouth daily.   . aMarland Kitchenorvastatin (LIPITOR) 10 MG tablet TAKE 1 TABLET BY MOUTH EVERY DAY   . beta carotene w/minerals (OCUVITE) tablet Take 1 tablet by mouth daily. 08/17/2018: Uses preservision  . calcium citrate-vitamin D (CITRACAL+D) 315-200 MG-UNIT tablet Take 1 tablet by mouth 2 (two) times daily.   . Cyanocobalamin (VITAMIN B 12 PO) Take 1,000 mg by mouth daily.   . Dulaglutide (TRULICITY) 1.5 MG/LT/9.0ZEPN Inject 0.5 mLs (1.5 mg total) into the skin once a week.   . ferrous sulfate 325 (65 FE) MG tablet Take 1 tablet (325 mg total) by mouth 2 (two) times daily.   . hydrocortisone (ANUSOL-HC) 2.5 % rectal cream PLACE 1 APPLICATION RECTALLY 2 (TWO) TIMES DAILY.   . IMarland Kitchensulin Pen Needle (PEN NEEDLES) 32G X 4 MM MISC 1 each by Does not apply route once a week. Use as directed once per week with Ozempic   . levothyroxine (SYNTHROID) 112 MCG tablet Take 1 tablet (112 mcg total) by mouth daily before breakfast.   . lisinopril (ZESTRIL) 2.5 MG tablet TAKE 1 TABLET BY MOUTH EVERY DAY   . Multiple Vitamin (MULTIVITAMIN WITH MINERALS) TABS tablet Take  1 tablet by mouth daily.   . Multiple Vitamins-Minerals (HAIR SKIN AND NAILS FORMULA) TABS Take 1 tablet by mouth daily.   . mupirocin ointment (BACTROBAN) 2 % APPLY TO AFFECTED AREA TWICE A DAY   . Omega-3 Fatty Acids (FISH OIL) 1000 MG CAPS Take 1 capsule by mouth daily.    . Wheat Dextrin (BENEFIBER DRINK MIX PO) Take by mouth.    No facility-administered encounter medications on file as of 12/10/2020.    Current Diagnosis/Assessment:    Goals Addressed            This  Visit's Progress   . Pharmacy Care Plan       CARE PLAN ENTRY (see longitudinal plan of care for additional care plan information)  Current Barriers:  . Chronic Disease Management support, education, and care coordination needs related to Hypertension, Hyperlipidemia, and Diabetes   Hypertension BP Readings from Last 3 Encounters:  11/05/20 124/68  09/13/20 120/74  08/06/20 124/80   . Pharmacist Clinical Goal(s): o Over the next 90 days, patient will work with PharmD and providers to maintain BP goal <130/80 . Current regimen:  o Lisinopril 2.21m daily . Interventions: o Reviewed recent home blood pressure readings o Commended her on her diet and exercise lifestyle changes . Patient self care activities - Over the next 180 days, patient will: o Check BP twice daily, document, and provide at future appointments o Ensure daily salt intake < 2300 mg/day o Try to exercise for 30 minutes daily 5 times per week (150 minutes per week total)  Hyperlipidemia Lab Results  Component Value Date/Time   LDLCALC 88 11/05/2020 12:51 PM   . Pharmacist Clinical Goal(s): o Over the next 90 days, patient will work with PharmD and providers to achieve LDL goal < 70 . Current regimen:  o Atorvastatin 124mdaily . Interventions: o Provided dietary and exercise recommendations o Discussed appropriate goals for LDL  . Patient self care activities - Over the next 90 days, patient will: o Try to exercise for 30 minutes daily 5 times per week (150 minutes per week total) o Focus on eating a healthy, well-balanced diet   Diabetes Lab Results  Component Value Date/Time   HGBA1C 7.2 (H) 11/05/2020 12:51 PM   HGBA1C 6.2 (H) 08/06/2020 03:02 PM   . Pharmacist Clinical Goal(s): o Over the next 90 days, patient will work with PharmD and providers to achieve A1c goal <7% . Current regimen:  . Trulicity 1.2.3JSeekly on Tuesdays  . Interventions: o Discussed appropriate goals for fasting blood sugar  (80-130) and 2 hours after eating (less than 180) o Will need to re-enroll patient for [employer] 2022 . Patient self care activities - Over the next 90 days, patient will: o Check blood sugar twice daily, document, and provide at future appointments o Start limiting True Moo chocolate milk to a small bottle once a month  o Contact provider with any episodes of hypoglycemia o Try to exercise for 30 minutes daily 5 times per week (150 minutes per week total)  Medication management . Pharmacist Clinical Goal(s): o Over the next 180 days, patient will work with PharmD and providers to maintain optimal medication adherence . [facility] . Interventions o Comprehensive medication review performed. o Continue current medication management strategy . Patient self care activities - Over the next 180 days, patient will: o Focus on medication adherence by continued use of pill box o Take medications as prescribed o Report any questions or  concerns to PharmD and/or provider(s)  Please see past updates related to this goal by clicking on the "Past Updates" button in the selected goal        Social Determinants of Health with Concerns   Physical Activity: Inactive  . Days of Exercise per Week: 0 days  . Minutes of Exercise per Session: 0 min  Social Connections: Not on file  Intimate Partner Violence: Not on file  Depression (PHQ2-9): Medium Risk  . PHQ-2 Score: 7  Alcohol Screen: Not on file  Housing: Not on file     Diabetes   A1c goal <7%  Recent Relevant Labs: Lab Results  Component Value Date/Time   HGBA1C 7.2 (H) 11/05/2020 12:51 PM   HGBA1C 6.2 (H) 08/06/2020 03:02 PM   MICROALBUR 10 08/06/2020 01:40 PM   MICROALBUR 10 10/05/2019 12:37 PM    Last diabetic Eye exam: No results found for: HMDIABEYEEXA  Last diabetic Foot exam: No results found for: HMDIABFOOTEX   Checking BG: 2x per Day ,every day at 7:30 am - 8 am and then 9 PM reading   Recent FBG  Readings: 112, 150,129,105,133, 95, 129, 128, 105  Recent 2hr PP BG readings: 123, 125, 130, 132, 133,129   Patient has failed these meds in past: Lantus, metformin, Kombiglyze, glimeperide, Ozempic Patient is currently uncontrolled on the following medications: . Trulicity 1.0UV weekly on Tuesday   We discussed:  . Diet and exercise extensively: o Breakfast: sometimes has a breakfast bar  o Patient reports that she has been drinking more chocolate milk - 8 ounces for breakfast sometimes o She drinks diet green tea hot . Pt denies BG <70 and symptoms of hypoglycemia o She has peanut butter in case of low blood sugar and water . Glimepiride was stopped due to readings being less than 100  o She reports taking Glimepiride when she feels like her sugar is high - I recommended that patient not take the medication because it was stopped by her PCP . Pt receives Trulicity through [employer] patient assistance program  Plan Continue current medications  CPA to do diabetes assessments with patient and review blood sugar   Hyperlipidemia   LDL goal < 70  Lipid Panel     Component Value Date/Time   CHOL 166 11/05/2020 1251   TRIG 77 11/05/2020 1251   HDL 63 11/05/2020 1251   LDLCALC 88 11/05/2020 1251    Hepatic Function Latest Ref Rng & Units 08/06/2020 04/22/2020 01/18/2020  Total Protein 6.0 - 8.5 g/dL 6.9 6.5 6.9  Albumin 3.7 - 4.7 g/dL 4.8(H) 4.5 4.5  AST 0 - 40 IU/L '21 21 25  ' ALT 0 - 32 IU/L 35(H) 28 42(H)  Alk Phosphatase 44 - 121 IU/L 88 78 84  Total Bilirubin 0.0 - 1.2 mg/dL 0.5 0.6 0.5     The 10-year ASCVD risk score Mikey Bussing DC Jr., et al., 2013) is: 25.7%   Values used to calculate the score:     Age: 89 years     Sex: Female     Is Non-Hispanic African American: Yes     Diabetic: Yes     Tobacco smoker: No     Systolic Blood Pressure: 253 mmHg     Is BP treated: Yes     HDL Cholesterol: 63 mg/dL     Total Cholesterol: 166 mg/dL   Patient has failed these meds in  past: N/A Patient is currently uncontrolled on the following medications:  . Atorvastatin 110m daily  We discussed:   . Diet extensively o Pt says she is not eating very much due to schedule with her husband o She limits fried foods (bakes her food) o Eats fruit cups or saltines with peanut butter, or a piece of toast with peanut butter for breakfast o Eats vegetables and fruits o Pork Chops, Chicken, Baked Fish, Bean soup  o Does not drink soda  o Drinks water during the day (unsure of how much) o Recommend pt drink 64 ounces of water daily (8- 8 ounce bottles of water daily) o Recommend well-balanced meals  . Exercise extensively o Pt says she is very active and moving constantly o Recommend pt get 30 minutes of moderate intensity exercise daily 5 times a week (150 minutes total per week) . Triglycerides were elevated at last lipid panel  Plan Continue current medications  Recommend lipid panel at next appointment  Anemia   CBC Latest Ref Rng & Units 08/06/2020 11/17/2018 08/17/2018  WBC 3.4 - 10.8 x10E3/uL 4.4 4.0 3.5  Hemoglobin 11.1 - 15.9 g/dL 11.4 10.4(L) 10.8(L)  Hematocrit 34.0 - 46.6 % 33.7(L) 29.8(L) 32.8(L)  Platelets 150 - 450 x10E3/uL 215 188 194   Iron/TIBC/Ferritin/ %Sat    Component Value Date/Time   IRON 73 12/21/2017 1127   TIBC 309 12/21/2017 1127   FERRITIN 161 12/21/2017 1127   IRONPCTSAT 24 12/21/2017 1127    Patient has failed these meds in past: none noted Patient is currently uncontrolled on the following medications:  . Ferrous Sulfate 352 mg- taking 1 tablet prior to lunch time and dinner . Vitamin B12- taking once daily  We discussed:    The importance of taking medication daily  We discussed the importance of not crushing or chewing   Taking with water  Plan  Continue current medications  Hypertension   BP goal is:  <130/80  Office blood pressures are  BP Readings from Last 3 Encounters:  11/05/20 124/68  09/13/20 120/74   08/06/20 124/80   Patient checks BP at home twice daily - in the morning after BG, and  Patient home BP readings are ranging:   Patient has failed these meds in the past: Lisinopril/HCTZ Patient is currently controlled on the following medications:  . Lisinopril 2.12m daily  We discussed: . Denies lightheadedness and dizziness . Pt has still been taking lisinopril 179m1/2 tablet daily (2 tablets left) o After pt finishes current supply she will start the new strength (2.39m67maily) o Pt has already picked up the new strength of lisinopril  Plan Continue current medications   Hypothyroidism   Lab Results  Component Value Date/Time   TSH 1.500 11/05/2020 12:51 PM   TSH 0.868 08/06/2020 03:02 PM   FREET4 1.42 10/05/2019 08:55 AM   FREET4 1.50 05/10/2019 10:37 AM   Patient has failed these meds in past: N/A Patient is currently controlled on the following medications:  . Levothyroxine 112m50maily before breakfast  We discussed:    Takes levothyroxine first thing in the morning on an empty stomach around 7:30am  Plan Continue current medications  Health Maintenance   Patient is currently on the following medications:  . ViMarland Kitchenamin C 1000mg3mly . Aspirin 81mg 17my . Preservision daily . Hydrocortisone 2.5% rectal cream . Multivitamin daily . Omega- fatty acids 1000mg d9m . Benefiber drink once daily . Citracal + D twice daily  We discussed:    No issues with current OTC regimen  Plan Continue current medications  Discuss calcium/Vitamin D/osteoporosis screening  at follow up  Vaccines   Reviewed and discussed patient's vaccination history.    Immunization History  Administered Date(s) Administered  . Fluad Quad(high Dose 65+) 08/11/2019, 09/13/2020  . Influenza, High Dose Seasonal PF 08/08/2018  . Influenza,inj,Quad PF,6+ Mos 08/16/2014  . Influenza-Unspecified 08/08/2018, 08/11/2019  . PFIZER(Purple Top)SARS-COV-2 Vaccination 12/21/2019, 01/11/2020,  10/23/2020  . Pneumococcal Conjugate-13 10/02/2020  . Pneumococcal Polysaccharide-23 08/24/2019  . Tdap 09/18/2019   Plan Review and discuss at follow up   Medication Management   [facility] Uses pill box? Yes Pt endorses 100% compliance  We discussed:  . Importance of taking each medications daily as directed . Not interested in medication synchronization/adherence packaging/delivery available with [facility] because she likes to [facility] counter stuff at [facility] . COVID-19 Test ordered through:  [email or web address]            - Your order #: [id number]  Plan Continue current medication management strategy  CPP will reach out to the [facility] team to help patient with other resources social worker and nurse.    Follow up: 3 month phone visit  Orlando Penner, PharmD Clinical Pharmacist [facility] [phone]

## 2020-12-16 ENCOUNTER — Ambulatory Visit: Payer: Medicare Other

## 2020-12-16 DIAGNOSIS — I10 Essential (primary) hypertension: Secondary | ICD-10-CM

## 2020-12-16 DIAGNOSIS — E119 Type 2 diabetes mellitus without complications: Secondary | ICD-10-CM

## 2020-12-16 DIAGNOSIS — E782 Mixed hyperlipidemia: Secondary | ICD-10-CM

## 2020-12-16 NOTE — Patient Instructions (Signed)
   Goals we discussed today:   .  Work with SW to address caregiver stress        Timeframe:  Long-Range Goal Priority:  High Start Date:  1.31.22                           Expected End Date: 5.31.22  Next planned outreach: 2.11.22  Patient Goals/Self-Care Activities Over the next 30 days, patient will:  . Patient will self administer medications as prescribed . Patient will attend all scheduled provider appointments . Patient will call provider office for new concerns or questions . Contact SW as needed prior to next scheduled call

## 2020-12-16 NOTE — Chronic Care Management (AMB) (Signed)
Chronic Care Management    Social Work Note  12/16/2020 Name: Catherine [name] MRN: [id number] DOB: 06/25/1946  Catherine [name] is a 75 y.o. year old female who is a primary care patient of [name], FNP. The CCM team was consulted to assist the patient with chronic disease management and/or care coordination needs related to: Caregiver Stress.   Engaged with patient by telephone for initial visit in response to provider referral for social work chronic care management and care coordination services.   Consent to Services:  The patient was given information about Chronic Care Management services, agreed to services, and gave verbal consent prior to initiation of services.  Please see initial visit note for detailed documentation.   Patient agreed to services and consent obtained.   Assessment: Review of patient past medical history, allergies, medications, and health status, including review of relevant consultants reports was performed today as part of a comprehensive evaluation and provision of chronic care management and care coordination services.     SDOH (Social Determinants of Health) assessments and interventions performed:    Advanced Directives Status: Not addressed in this encounter.  CCM Care Plan  No Known Allergies  Outpatient Encounter Medications as of 12/16/2020  Medication Sig Note  . Accu-Chek FastClix Lancets MISC AS DIRECTED TWICE A DAY SUBCUTANEOUS 90 DAYS E11.9   . Ascorbic Acid (VITAMIN C) 1000 MG tablet Take 1,000 mg by mouth daily.   [name] Kitchen aspirin 81 MG tablet Take 81 mg by mouth daily.   [name] Kitchen atorvastatin (LIPITOR) 10 MG tablet TAKE 1 TABLET BY MOUTH EVERY DAY   . beta carotene w/minerals (OCUVITE) tablet Take 1 tablet by mouth daily. 08/17/2018: Uses preservision  . calcium citrate-vitamin D (CITRACAL+D) 315-200 MG-UNIT tablet Take 1 tablet by mouth 2 (two) times daily.   . Cyanocobalamin (VITAMIN B 12 PO) Take 1,000 mg by mouth daily.   . Dulaglutide  (TRULICITY) 1.5 MG/0.5ML SOPN Inject 0.5 mLs (1.5 mg total) into the skin once a week.   . ferrous sulfate 325 (65 FE) MG tablet Take 1 tablet (325 mg total) by mouth 2 (two) times daily.   . hydrocortisone (ANUSOL-HC) 2.5 % rectal cream PLACE 1 APPLICATION RECTALLY 2 (TWO) TIMES DAILY.   [name] Kitchen Insulin Pen Needle (PEN NEEDLES) 32G X 4 MM MISC 1 each by Does not apply route once a week. Use as directed once per week with Ozempic   . levothyroxine (SYNTHROID) 112 MCG tablet Take 1 tablet (112 mcg total) by mouth daily before breakfast.   . lisinopril (ZESTRIL) 2.5 MG tablet TAKE 1 TABLET BY MOUTH EVERY DAY   . Multiple Vitamin (MULTIVITAMIN WITH MINERALS) TABS tablet Take 1 tablet by mouth daily.   . Multiple Vitamins-Minerals (HAIR SKIN AND NAILS FORMULA) TABS Take 1 tablet by mouth daily.   . mupirocin ointment (BACTROBAN) 2 % APPLY TO AFFECTED AREA TWICE A DAY   . Omega-3 Fatty Acids (FISH OIL) 1000 MG CAPS Take 1 capsule by mouth daily.    . Wheat Dextrin (BENEFIBER DRINK MIX PO) Take by mouth.    No facility-administered encounter medications on file as of 12/16/2020.    Patient Active Problem List   Diagnosis Date Noted  . Type 2 diabetes mellitus without complication, without long-term current use of insulin (HCC) 02/06/2019  . Essential hypertension 02/06/2019  . Hordeolum externum of left upper eyelid 12/14/2018  . Benign hypertension with chronic kidney disease, stage III (HCC) 08/03/2018  . Diabetes type 2, controlled 08/23/2014  .  Anemia 08/23/2014  . Hypokalemia 08/23/2014  . CAP (community acquired pneumonia)--treated 08/23/2014  . Physical debility 08/16/2014  . Pneumonia 08/13/2014  . DKA, type 2 (HCC) 08/13/2014  . Acute renal failure (HCC) 08/13/2014  . Hypernatremia 08/13/2014  . Elevated LFTs 08/13/2014  . Acquired hypothyroidism 08/13/2014  . DKA (diabetic ketoacidoses) 08/13/2014    Conditions to be addressed/monitored: HTN, HLD and DMII; caregiver stress  Care  Plan : Social Work [facility] Care Plan  Updates made by Bevelyn Ngo since 12/16/2020 12:00 AM    Problem: Caregiver Stress     Long-Range Goal: Caregiver Coping Optimized   Start Date: 12/16/2020  Expected End Date: 04/15/2021  Priority: High  Note:   Current Barriers:  . Caregiver burnout . Chronic conditions including DM II, HLD, and HTN  . Limited social support   Social Work Clinical Goal(s):  [name] Kitchen Over the next 120 days, patient will work with SW to address concerns related to caregiver stress  Interventions: . 1:1 collaboration with [name], FNP regarding development and update of comprehensive plan of care as evidenced by provider attestation and co-signature . Inter-disciplinary care team collaboration (see longitudinal plan of care) . In basket message received from Maisie Fus D requesting SW assistance with resources pertaining to social support . Successful outbound call placed to the patient to assist with caregiver resource needs . Determined the patient is having difficulty caring for her husband since he has been discharged from home health . Attempted to explore respite resources - patient requests SW contact her at a later date as she prefers to speak with the patients PCP before exploring resources . Scheduled follow up call over the next two weeks  Patient Goals/Self-Care Activities Over the next 30 days, patient will:   - Patient will self administer medications as prescribed Patient will attend all scheduled provider appointments Patient will call provider office for new concerns or questions Contact SW as needed prior to next scheduled call  Follow up Plan: SW will follow up with patient by phone over the next two weeks       Follow Up Plan: SW will follow up with patient by phone over the next two weeks      Bevelyn Ngo, BSW, CDP Social Worker, Certified Dementia Practitioner [facility] Care Management [phone]  Total time spent  performing care coordination and/or care management activities with the patient by phone or face to face = 15 minutes.

## 2020-12-23 ENCOUNTER — Telehealth: Payer: Self-pay

## 2020-12-23 NOTE — Chronic Care Management (AMB) (Addendum)
Chronic Care Management Pharmacy Assistant   Name: Catherine [name]  MRN: [id number] DOB: 05-05-1946  Reason for Encounter: Diabetes Adherence Call  Patient Questions:  1.  Have you seen any other providers since your last visit? Yes, 12/16/2020-Humble, Kendra (CCM).     2.  Any changes in your medicines or health? No     PCP : [name], FNP  Allergies:  No Known Allergies  Medications: Outpatient Encounter Medications as of 12/23/2020  Medication Sig Note   Accu-Chek FastClix Lancets MISC AS DIRECTED TWICE A DAY SUBCUTANEOUS 90 DAYS E11.9    Ascorbic Acid (VITAMIN C) 1000 MG tablet Take 1,000 mg by mouth daily.    aspirin 81 MG tablet Take 81 mg by mouth daily.    atorvastatin (LIPITOR) 10 MG tablet TAKE 1 TABLET BY MOUTH EVERY DAY    beta carotene w/minerals (OCUVITE) tablet Take 1 tablet by mouth daily. 08/17/2018: Uses preservision   calcium citrate-vitamin D (CITRACAL+D) 315-200 MG-UNIT tablet Take 1 tablet by mouth 2 (two) times daily.    Cyanocobalamin (VITAMIN B 12 PO) Take 1,000 mg by mouth daily.    Dulaglutide (TRULICITY) 1.5 MG/0.5ML SOPN Inject 0.5 mLs (1.5 mg total) into the skin once a week.    ferrous sulfate 325 (65 FE) MG tablet Take 1 tablet (325 mg total) by mouth 2 (two) times daily.    hydrocortisone (ANUSOL-HC) 2.5 % rectal cream PLACE 1 APPLICATION RECTALLY 2 (TWO) TIMES DAILY.    Insulin Pen Needle (PEN NEEDLES) 32G X 4 MM MISC 1 each by Does not apply route once a week. Use as directed once per week with Ozempic    levothyroxine (SYNTHROID) 112 MCG tablet Take 1 tablet (112 mcg total) by mouth daily before breakfast.    lisinopril (ZESTRIL) 2.5 MG tablet TAKE 1 TABLET BY MOUTH EVERY DAY    Multiple Vitamin (MULTIVITAMIN WITH MINERALS) TABS tablet Take 1 tablet by mouth daily.    Multiple Vitamins-Minerals (HAIR SKIN AND NAILS FORMULA) TABS Take 1 tablet by mouth daily.    mupirocin ointment (BACTROBAN) 2 % APPLY TO AFFECTED AREA TWICE A DAY     Omega-3 Fatty Acids (FISH OIL) 1000 MG CAPS Take 1 capsule by mouth daily.     Wheat Dextrin (BENEFIBER DRINK MIX PO) Take by mouth.    No facility-administered encounter medications on file as of 12/23/2020.    Current Diagnosis: Patient Active Problem List   Diagnosis Date Noted   Type 2 diabetes mellitus without complication, without long-term current use of insulin (HCC) 02/06/2019   Essential hypertension 02/06/2019   Hordeolum externum of left upper eyelid 12/14/2018   Benign hypertension with chronic kidney disease, stage III (HCC) 08/03/2018   Diabetes type 2, controlled 08/23/2014   Anemia 08/23/2014   Hypokalemia 08/23/2014   CAP (community acquired pneumonia)--treated 08/23/2014   Physical debility 08/16/2014   Pneumonia 08/13/2014   DKA, type 2 (HCC) 08/13/2014   Acute renal failure (HCC) 08/13/2014   Hypernatremia 08/13/2014   Elevated LFTs 08/13/2014   Acquired hypothyroidism 08/13/2014   DKA (diabetic ketoacidoses) 08/13/2014   Recent Relevant Labs: Lab Results  Component Value Date/Time   HGBA1C 7.2 (H) 11/05/2020 12:51 PM   HGBA1C 6.2 (H) 08/06/2020 03:02 PM   MICROALBUR 10 08/06/2020 01:40 PM   MICROALBUR 10 10/05/2019 12:37 PM    Kidney Function Lab Results  Component Value Date/Time   CREATININE 1.28 (H) 11/05/2020 12:51 PM   CREATININE 1.32 (H) 08/06/2020 03:02 PM  CREATININE 1.79 (H) 12/21/2017 11:26 AM   GFRNONAA 42 (L) 11/05/2020 12:51 PM   GFRNONAA 28 (L) 12/21/2017 11:26 AM   GFRAA 48 (L) 11/05/2020 12:51 PM   GFRAA 32 (L) 12/21/2017 11:26 AM    Current antihyperglycemic regimen:  Trulicity 1.5mg  weekly on Tuesdays   What recent interventions/DTPs have been made to improve glycemic control:  Recent interventions was to check blood sugar twice daily, document, and provide at future appointments. Start limiting True Moo chocolate milk to a small bottle once a month. Contact provider with any episodes of hypoglycemia. Try to exercise for 30  minutes daily 5 times per week (150 minutes per week total). Patient stated she has stopped drinking chocolate milk. Patient stated she is checking blood sugars twice daily and taking medications as prescribed.  Have there been any recent hospitalizations or ED visits since last visit with CPP? No   Patient denies hypoglycemic symptoms, including Pale, Sweaty, Shaky, Hungry, Nervous/irritable and Vision changes   Patient denies hyperglycemic symptoms, including blurry vision, excessive thirst, fatigue, polyuria and weakness   How often are you checking your blood sugar? twice daily and in the morning before eating or drinking and 2 hours after eating.  What are your blood sugars ranging?  Fasting: 167 - 12/23/20. Before meals: None After meals: None Bedtime: None  During the week, how often does your blood glucose drop below 70? Never   Are you checking your feet daily/regularly? Patient checks feet daily.   Adherence Review: Is the patient currently on a STATIN medication? Yes   Is the patient currently on ACE/ARB medication? Yes   Does the patient have >5 day gap between last estimated fill dates? No    Goals Addressed             This Visit's Progress    Pharmacy Care Plan   Not on track    CARE PLAN ENTRY (see longitudinal plan of care for additional care plan information)  Current Barriers:  Chronic Disease Management support, education, and care coordination needs related to Hypertension, Hyperlipidemia, and Diabetes   Hypertension BP Readings from Last 3 Encounters:  11/05/20 124/68  09/13/20 120/74  08/06/20 124/80   Pharmacist Clinical Goal(s): Over the next 90 days, patient will work with PharmD and providers to maintain BP goal <130/80 Current regimen:  Lisinopril 2.5mg  daily Interventions: Reviewed recent home blood pressure readings Commended her on her diet and exercise lifestyle changes Patient self care activities - Over the next 180 days,  patient will: Check BP twice daily, document, and provide at future appointments Ensure daily salt intake < 2300 mg/day Try to exercise for 30 minutes daily 5 times per week (150 minutes per week total)  Hyperlipidemia Lab Results  Component Value Date/Time   LDLCALC 88 11/05/2020 12:51 PM   Pharmacist Clinical Goal(s): Over the next 90 days, patient will work with PharmD and providers to achieve LDL goal < 70 Current regimen:  Atorvastatin 10mg  daily Interventions: Provided dietary and exercise recommendations Discussed appropriate goals for LDL  Patient self care activities - Over the next 90 days, patient will: Try to exercise for 30 minutes daily 5 times per week (150 minutes per week total) Focus on eating a healthy, well-balanced diet   Diabetes Lab Results  Component Value Date/Time   HGBA1C 7.2 (H) 11/05/2020 12:51 PM   HGBA1C 6.2 (H) 08/06/2020 03:02 PM   Pharmacist Clinical Goal(s): Over the next 90 days, patient will work with PharmD and providers to  achieve A1c goal <7% Current regimen:  Trulicity 1.5mg  weekly on Tuesdays  Interventions: Discussed appropriate goals for fasting blood sugar (80-130) and 2 hours after eating (less than 180) Will need to re-enroll patient for [employer] for 2022 Patient self care activities - Over the next 90 days, patient will: Check blood sugar twice daily, document, and provide at future appointments Start limiting True Moo chocolate milk to a small bottle once a month  Contact provider with any episodes of hypoglycemia Try to exercise for 30 minutes daily 5 times per week (150 minutes per week total)  Medication management Pharmacist Clinical Goal(s): Over the next 180 days, patient will work with PharmD and providers to maintain optimal medication adherence Current pharmacy: [facility] Interventions Comprehensive medication review performed. Continue current medication management strategy Patient self care activities - Over the  next 180 days, patient will: Focus on medication adherence by continued use of pill box Take medications as prescribed Report any questions or concerns to PharmD and/or provider(s)  Please see past updates related to this goal by clicking on the "Past Updates" button in the selected goal          Follow-Up:  Pharmacist Review-Patient communicated that she is trying to balance blood sugar readings below 126 by eating the right foods but recently had a higher reading and manage her stress due to taking care of husband. Patient voiced she was advised to stop taking Glimepiride by PCP and reminded by Glennon Hamilton, CPP; only take Trulicity. Patient stated she feels she has a better FBG before snacking. Patient also stated she would be needing refills of medications but unable to tell me exactly what she needs at this time because she has people doing work in the house but does recall needing refill for test strips to check blood sugars. Advised patient to please call me back once she has time to go over medications and voice what refills she will be needing. Patient verbalized understanding. Call back number was provided during call.  Patient mentioned she dropped wheelchair on top of her foot while helping husband out of the vehicle. Patient stated there was some bruising on big toe that eventually turned bluish. Patient voiced she is still taking care of the bruising by keeping it cleaned.  Cherylin Mylar, CPP Notified.  Suezanne Cheshire, CMA Clinical Pharmacist Assistant [phone]  I have reviewed the care management and care coordination activities outlined in this encounter and I am certifying that I agree with the content of this note. I informed patients PCP Team of patient dropping husbands wheelchair on big toe for follow up.   13 minutes spent in review, coordination, and documentation.  Harlan Stains, [facility] 12/24/20 1:33 PM

## 2020-12-25 ENCOUNTER — Other Ambulatory Visit: Payer: Self-pay

## 2020-12-25 MED ORDER — ACCU-CHEK GUIDE VI STRP: ORAL_STRIP | 2 refills | Status: DC

## 2020-12-27 ENCOUNTER — Telehealth: Payer: Medicare Other

## 2020-12-27 ENCOUNTER — Other Ambulatory Visit: Payer: Self-pay | Admitting: Nurse Practitioner

## 2020-12-27 ENCOUNTER — Telehealth: Payer: Self-pay

## 2020-12-27 DIAGNOSIS — E039 Hypothyroidism, unspecified: Secondary | ICD-10-CM

## 2020-12-27 NOTE — Telephone Encounter (Signed)
  Chronic Care Management   Outreach Note  12/27/2020 Name: Catherine [name] MRN: [id number] DOB: Apr 10, 1946  Referred by: [name], FNP Reason for referral : Care Coordination   An unsuccessful telephone outreach was attempted today. The patient was referred to the case management team for assistance with care management and care coordination.     Follow Up Plan: A HIPAA compliant phone message was left for the patient providing contact information and requesting a return call.  The care management team will reach out to the patient again over the next 21 days.   Bevelyn Ngo, BSW, CDP Social Worker, Certified Dementia Practitioner [facility] Care Management [phone]

## 2021-01-06 ENCOUNTER — Other Ambulatory Visit: Payer: Self-pay | Admitting: Nurse Practitioner

## 2021-01-06 DIAGNOSIS — I10 Essential (primary) hypertension: Secondary | ICD-10-CM

## 2021-01-09 ENCOUNTER — Telehealth: Payer: Medicare Other

## 2021-01-09 ENCOUNTER — Telehealth: Payer: Self-pay

## 2021-01-09 NOTE — Telephone Encounter (Signed)
  Chronic Care Management   Outreach Note  01/09/2021 Name: NICKEY CANEDO MRN: [id number] DOB: 1946/08/25  Referred by: [name], FNP Reason for referral : Chronic Care Management   A second unsuccessful telephone outreach was attempted today. SW unable to leave a voice message requesting a return call. The patient was referred to the case management team for assistance with care management and care coordination.   Follow Up Plan: The care management team will reach out to the patient again over the next 14 days.   Bevelyn Ngo, BSW, CDP Social Worker, Certified Dementia Practitioner [facility] Care Management [phone]

## 2021-01-21 ENCOUNTER — Telehealth: Payer: Self-pay

## 2021-01-21 NOTE — Chronic Care Management (AMB) (Signed)
Chronic Care Management Pharmacy Assistant   Name: Catherine [name]  MRN: [id number] DOB: 1946-10-01   Reason for Encounter: Hypertension Adherence Call.   Recent office visits:  12/16/2020-Humble, Kendra (CCM).  Recent consult visits:  None.  Hospital visits:  None in previous 6 months  Medications: Outpatient Encounter Medications as of 01/21/2021  Medication Sig Note   Accu-Chek FastClix Lancets MISC AS DIRECTED TWICE A DAY SUBCUTANEOUS 90 DAYS E11.9    Ascorbic Acid (VITAMIN C) 1000 MG tablet Take 1,000 mg by mouth daily.    aspirin 81 MG tablet Take 81 mg by mouth daily.    atorvastatin (LIPITOR) 10 MG tablet TAKE 1 TABLET BY MOUTH EVERY DAY    beta carotene w/minerals (OCUVITE) tablet Take 1 tablet by mouth daily. 08/17/2018: Uses preservision   calcium citrate-vitamin D (CITRACAL+D) 315-200 MG-UNIT tablet Take 1 tablet by mouth 2 (two) times daily.    Cyanocobalamin (VITAMIN B 12 PO) Take 1,000 mg by mouth daily.    Dulaglutide (TRULICITY) 1.5 MG/0.5ML SOPN Inject 0.5 mLs (1.5 mg total) into the skin once a week.    ferrous sulfate 325 (65 FE) MG tablet Take 1 tablet (325 mg total) by mouth 2 (two) times daily.    glucose blood (ACCU-CHEK GUIDE) test strip Use to check blood sugars twice daily E11.69    hydrocortisone (ANUSOL-HC) 2.5 % rectal cream PLACE 1 APPLICATION RECTALLY 2 (TWO) TIMES DAILY.    Insulin Pen Needle (PEN NEEDLES) 32G X 4 MM MISC 1 each by Does not apply route once a week. Use as directed once per week with Ozempic    levothyroxine (SYNTHROID) 112 MCG tablet TAKE 1 TABLET (112 MCG TOTAL) BY MOUTH DAILY BEFORE BREAKFAST.    lisinopril (ZESTRIL) 2.5 MG tablet TAKE 1 TABLET BY MOUTH EVERY DAY    Multiple Vitamin (MULTIVITAMIN WITH MINERALS) TABS tablet Take 1 tablet by mouth daily.    Multiple Vitamins-Minerals (HAIR SKIN AND NAILS FORMULA) TABS Take 1 tablet by mouth daily.    mupirocin ointment (BACTROBAN) 2 % APPLY TO AFFECTED AREA  TWICE A DAY    Omega-3 Fatty Acids (FISH OIL) 1000 MG CAPS Take 1 capsule by mouth daily.     Wheat Dextrin (BENEFIBER DRINK MIX PO) Take by mouth.    No facility-administered encounter medications on file as of 01/21/2021.   Reviewed chart prior to disease state call. Spoke with patient regarding BP  Recent Office Vitals: BP Readings from Last 3 Encounters:  11/05/20 124/68  09/13/20 120/74  08/06/20 124/80   Pulse Readings from Last 3 Encounters:  11/05/20 72  09/13/20 79  08/06/20 73    Wt Readings from Last 3 Encounters:  11/05/20 173 lb 6.4 oz (78.7 kg)  09/13/20 166 lb 12.8 oz (75.7 kg)  08/06/20 166 lb 9.6 oz (75.6 kg)     Kidney Function Lab Results  Component Value Date/Time   CREATININE 1.28 (H) 11/05/2020 12:51 PM   CREATININE 1.32 (H) 08/06/2020 03:02 PM   CREATININE 1.79 (H) 12/21/2017 11:26 AM   GFRNONAA 42 (L) 11/05/2020 12:51 PM   GFRNONAA 28 (L) 12/21/2017 11:26 AM   GFRAA 48 (L) 11/05/2020 12:51 PM   GFRAA 32 (L) 12/21/2017 11:26 AM    BMP Latest Ref Rng & Units 11/05/2020 08/06/2020 04/22/2020  Glucose 65 - 99 mg/dL 664(Q) 034(V) 425(Z)  BUN 8 - 27 mg/dL 17 19 21   Creatinine 0.57 - 1.00 mg/dL ) 5.63(O) 7.56(E)  BUN/Creat Ratio 12 - 28 13 14  15  Sodium 134 - 144 mmol/L 141 141 138  Potassium 3.5 - 5.2 mmol/L 4.5 5.1 4.6  Chloride 96 - 106 mmol/L 104 105 102  CO2 20 - 29 mmol/L 24 24 22   Calcium 8.7 - 10.3 mg/dL 9.8 9.9 9.7     Current antihypertensive regimen:  ? Lisinopril 2.5mg  daily.   How often are you checking your Blood Pressure? daily in the morning.   Current home BP readings: BP: 130/73, HR:67, weight: 164. Weight: 164, BP:115/64, HR:61. 120/51, HR: 68, BP:124/72, HR:75.   What recent interventions/DTPs have been made by any provider to improve Blood Pressure control since last CPP Visit:  - Patient reports she is taking her medications as directed.   Any recent hospitalizations or ED visits since last visit with CPP? No     What diet changes have been made to improve Blood Pressure Control?  o Patient reports she is drinking plenty of water but not 8 glasses a day, eating vegetables, and fruits.   What exercise is being done to improve your Blood Pressure Control?  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  Adherence Review: Is the patient currently on ACE/ARB medication? Yes Does the patient have >5 day gap between last estimated fill dates? Yes   Recent Relevant Labs: Lab Results  Component Value Date/Time   HGBA1C 7.2 (H) 11/05/2020 12:51 PM   HGBA1C 6.2 (H) 08/06/2020 03:02 PM   MICROALBUR 10 08/06/2020 01:40 PM   MICROALBUR 10 10/05/2019 12:37 PM    Kidney Function Lab Results  Component Value Date/Time   CREATININE 1.28 (H) 11/05/2020 12:51 PM   CREATININE 1.32 (H) 08/06/2020 03:02 PM   CREATININE 1.79 (H) 12/21/2017 11:26 AM   GFRNONAA 42 (L) 11/05/2020 12:51 PM   GFRNONAA 28 (L) 12/21/2017 11:26 AM   GFRAA 48 (L) 11/05/2020 12:51 PM   GFRAA 32 (L) 12/21/2017 11:26 AM     Current antihyperglycemic regimen:   Trulicity 1.5mg  weekly on Tuesdays    What recent interventions/DTPs have been made to improve glycemic control:  o Patient reports she is taking her medication as directed.   Have there been any recent hospitalizations or ED visits since last visit with CPP? No    Patient denies hypoglycemic symptoms, including Pale, Sweaty, Shaky, Hungry, Nervous/irritable and Vision changes    Patient denies hyperglycemic symptoms, including blurry vision, excessive thirst, fatigue, polyuria and weakness    How often are you checking your blood sugar? twice daily and in the morning before eating or drinking and 2 hours after dinner meal.    What are your blood sugars ranging?  o Fasting: 140 AM, 179 AM, 190 AM o Before meals: None o After meals: 150 PM (2 hours after eating) o Bedtime: none   During the week, how often does your  blood glucose drop below 70? Never    Are you checking your feet daily/regularly? Patient states she is checking her feet daily with no pain since the accident with the wheelchair falling on her foot.  Adherence Review: Is the patient currently on a STATIN medication? Yes Is the patient currently on ACE/ARB medication? Yes Does the patient have >5 day gap between last estimated fill dates? Yes  Star Rating Drugs: Patient reports she is exhausted and tired from her daily activities when blood sugars are elevated. Patient reports she eats a tuna sandwich, an apple, hot dog, an orange, and a banana. The patient stated her  and Mr. Llamas will split 2 hotdogs and a hot fudge sundae from [employer] . Patient stated she typically add Benefiber Fiber supplement in her coffee with the zero sugar creamer added. Patient stated she is drinking plenty of water, Ice tea lemon flavored, diet green tea, and coffee. Patient also stated she recently baked some chocolate chip cookies but will eat one while her husband will eat the rest.   The patient voiced Dr. Christell Constant directed her to stop taking the Glimepiride 4MG  last year but the patient decided to take it again while taking her Trulicity to control her blood sugars from raising.  Patient reports she is good with her Trulicity medication she received on 11/29/2020. Patient stated she is needing refills of Ferrous Sulfate 325 (65 FE) MG, hydrocortisone 2.5 %, lancets, Pen Needles32G X 4 MM, Glucose (Accu-Chek Guide)Test strips.  12/01/2020, CPP Notified.   Cherylin Mylar, CMA Clinical Pharmacist Assistant [phone] CCM Total Time: 58 Minutes

## 2021-01-22 ENCOUNTER — Ambulatory Visit: Payer: Self-pay

## 2021-01-22 ENCOUNTER — Other Ambulatory Visit: Payer: Self-pay

## 2021-01-22 ENCOUNTER — Telehealth: Payer: Medicare Other

## 2021-01-22 ENCOUNTER — Telehealth: Payer: Self-pay

## 2021-01-22 DIAGNOSIS — D649 Anemia, unspecified: Secondary | ICD-10-CM

## 2021-01-22 DIAGNOSIS — I10 Essential (primary) hypertension: Secondary | ICD-10-CM

## 2021-01-22 DIAGNOSIS — E119 Type 2 diabetes mellitus without complications: Secondary | ICD-10-CM

## 2021-01-22 MED ORDER — PEN NEEDLES 32G X 4 MM MISC: 1.0000 | 2 refills | Status: DC

## 2021-01-22 MED ORDER — ACCU-CHEK FASTCLIX LANCETS MISC: 4 refills | Status: DC

## 2021-01-22 MED ORDER — ACCU-CHEK GUIDE VI STRP: ORAL_STRIP | 2 refills | Status: DC

## 2021-01-22 MED ORDER — HYDROCORTISONE (PERIANAL) 2.5 % EX CREA: 1.0000 "application " | TOPICAL_CREAM | Freq: Two times a day (BID) | CUTANEOUS | 2 refills | Status: DC

## 2021-01-22 MED ORDER — FERROUS SULFATE 325 (65 FE) MG PO TABS: 325.0000 mg | ORAL_TABLET | Freq: Two times a day (BID) | ORAL | 1 refills | Status: DC

## 2021-01-22 NOTE — Telephone Encounter (Signed)
error 

## 2021-01-22 NOTE — Chronic Care Management (AMB) (Signed)
  Chronic Care Management   Outreach Note  01/22/2021 Name: Catherine [name] MRN: [id number] DOB: Jun 20, 1946  Referred by: [name], FNP Reason for referral : Chronic Care Management   Third unsuccessful telephone outreach was attempted today. The patient was referred to the case management team for assistance with care management and care coordination. The patient's primary care provider has been notified of our unsuccessful attempts to make or maintain contact with the patient. The care management team is pleased to engage with this patient at any time in the future should he/she be interested in assistance from the care management team.   Follow Up Plan: No SW follow up planned due to inability to maintain patient contact. Patient will remain engaged with PharmD.  Bevelyn Ngo, BSW, CDP Social Worker, Certified Dementia Practitioner [facility] Care Management [phone]

## 2021-01-23 ENCOUNTER — Encounter: Payer: Self-pay | Admitting: Nurse Practitioner

## 2021-01-23 ENCOUNTER — Other Ambulatory Visit: Payer: Self-pay

## 2021-01-23 ENCOUNTER — Ambulatory Visit (INDEPENDENT_AMBULATORY_CARE_PROVIDER_SITE_OTHER): Payer: Medicare Other

## 2021-01-23 ENCOUNTER — Ambulatory Visit (INDEPENDENT_AMBULATORY_CARE_PROVIDER_SITE_OTHER): Payer: Medicare Other | Admitting: Nurse Practitioner

## 2021-01-23 VITALS — BP 138/60 | HR 85 | Temp 97.9°F | Ht 63.2 in | Wt 169.3 lb

## 2021-01-23 VITALS — BP 138/60 | HR 85 | Temp 97.9°F | Ht 63.2 in | Wt 169.4 lb

## 2021-01-23 DIAGNOSIS — Z0000 Encounter for general adult medical examination without abnormal findings: Secondary | ICD-10-CM | POA: Diagnosis not present

## 2021-01-23 DIAGNOSIS — I10 Essential (primary) hypertension: Secondary | ICD-10-CM

## 2021-01-23 DIAGNOSIS — E039 Hypothyroidism, unspecified: Secondary | ICD-10-CM | POA: Diagnosis not present

## 2021-01-23 DIAGNOSIS — E559 Vitamin D deficiency, unspecified: Secondary | ICD-10-CM | POA: Diagnosis not present

## 2021-01-23 DIAGNOSIS — E782 Mixed hyperlipidemia: Secondary | ICD-10-CM | POA: Diagnosis not present

## 2021-01-23 DIAGNOSIS — E119 Type 2 diabetes mellitus without complications: Secondary | ICD-10-CM | POA: Diagnosis not present

## 2021-01-23 DIAGNOSIS — D649 Anemia, unspecified: Secondary | ICD-10-CM | POA: Diagnosis not present

## 2021-01-23 MED ORDER — FERROUS SULFATE 325 (65 FE) MG PO TABS: 325.0000 mg | ORAL_TABLET | Freq: Two times a day (BID) | ORAL | 1 refills | Status: AC

## 2021-01-23 MED ORDER — TRULICITY 3 MG/0.5ML ~~LOC~~ SOAJ: 3.0000 mg | SUBCUTANEOUS | 1 refills | Status: DC

## 2021-01-23 NOTE — Patient Instructions (Signed)

## 2021-01-23 NOTE — Patient Instructions (Signed)
Catherine [name] , Thank you for taking time to come for your Medicare Wellness Visit. I appreciate your ongoing commitment to your health goals. Please review the following plan we discussed and let me know if I can assist you in the future.   Screening recommendations/referrals: Colonoscopy: completed 02/26/2016 Mammogram: patient to schedule Bone Density: patient to schedule Recommended yearly ophthalmology/optometry visit for glaucoma screening and checkup Recommended yearly dental visit for hygiene and checkup  Vaccinations: Influenza vaccine: completed 09/13/2020, due 06/16/2021 Pneumococcal vaccine: completed 10/02/2020 Tdap vaccine: completed 09/18/2019, due 09/17/2029 Shingles vaccine: discussed   Covid-19: 10/23/2020, 01/11/2020, 12/21/2019  Advanced directives: Please bring a copy of your POA (Power of Attorney) and/or Living Will to your next appointment.   Conditions/risks identified: none  Next appointment: Follow up in one year for your annual wellness visit    Preventive Care 65 Years and Older, Female Preventive care refers to lifestyle choices and visits with your health care provider that can promote health and wellness. What does preventive care include?  A yearly physical exam. This is also called an annual well check.  Dental exams once or twice a year.  Routine eye exams. Ask your health care provider how often you should have your eyes checked.  Personal lifestyle choices, including:  Daily care of your teeth and gums.  Regular physical activity.  Eating a healthy diet.  Avoiding tobacco and drug use.  Limiting alcohol use.  Practicing safe sex.  Taking low-dose aspirin every day.  Taking vitamin and mineral supplements as recommended by your health care provider. What happens during an annual well check? The services and screenings done by your health care provider during your annual well check will depend on your age, overall health, lifestyle risk  factors, and family history of disease. Counseling  Your health care provider may ask you questions about your:  Alcohol use.  Tobacco use.  Drug use.  Emotional well-being.  Home and relationship well-being.  Sexual activity.  Eating habits.  History of falls.  Memory and ability to understand (cognition).  Work and work Astronomer.  Reproductive health. Screening  You may have the following tests or measurements:  Height, weight, and BMI.  Blood pressure.  Lipid and cholesterol levels. These may be checked every 5 years, or more frequently if you are over 44 years old.  Skin check.  Lung cancer screening. You may have this screening every year starting at age 54 if you have a 30-pack-year history of smoking and currently smoke or have quit within the past 15 years.  Fecal occult blood test (FOBT) of the stool. You may have this test every year starting at age 36.  Flexible sigmoidoscopy or colonoscopy. You may have a sigmoidoscopy every 5 years or a colonoscopy every 10 years starting at age 28.  Hepatitis C blood test.  Hepatitis B blood test.  Sexually transmitted disease (STD) testing.  Diabetes screening. This is done by checking your blood sugar (glucose) after you have not eaten for a while (fasting). You may have this done every 1-3 years.  Bone density scan. This is done to screen for osteoporosis. You may have this done starting at age 39.  Mammogram. This may be done every 1-2 years. Talk to your health care provider about how often you should have regular mammograms. Talk with your health care provider about your test results, treatment options, and if necessary, the need for more tests. Vaccines  Your health care provider may recommend certain vaccines, such as:  Influenza vaccine. This is recommended every year.  Tetanus, diphtheria, and acellular pertussis (Tdap, Td) vaccine. You may need a Td booster every 10 years.  Zoster vaccine. You  may need this after age 79.  Pneumococcal 13-valent conjugate (PCV13) vaccine. One dose is recommended after age 25.  Pneumococcal polysaccharide (PPSV23) vaccine. One dose is recommended after age 69. Talk to your health care provider about which screenings and vaccines you need and how often you need them. This information is not intended to replace advice given to you by your health care provider. Make sure you discuss any questions you have with your health care provider. Document Released: 11/29/2015 Document Revised: 07/22/2016 Document Reviewed: 09/03/2015 Elsevier Interactive Patient Education  2017 [place] Prevention in the Home Falls can cause injuries. They can happen to people of all ages. There are many things you can do to make your home safe and to help prevent falls. What can I do on the outside of my home?  Regularly fix the edges of walkways and driveways and fix any cracks.  Remove anything that might make you trip as you walk through a door, such as a raised step or threshold.  Trim any bushes or trees on the path to your home.  Use bright outdoor lighting.  Clear any walking paths of anything that might make someone trip, such as rocks or tools.  Regularly check to see if handrails are loose or broken. Make sure that both sides of any steps have handrails.  Any raised decks and porches should have guardrails on the edges.  Have any leaves, snow, or ice cleared regularly.  Use sand or salt on walking paths during winter.  Clean up any spills in your garage right away. This includes oil or grease spills. What can I do in the bathroom?  Use night lights.  Install grab bars by the toilet and in the tub and shower. Do not use towel bars as grab bars.  Use non-skid mats or decals in the tub or shower.  If you need to sit down in the shower, use a plastic, non-slip stool.  Keep the floor dry. Clean up any water that spills on the floor as soon as  it happens.  Remove soap buildup in the tub or shower regularly.  Attach bath mats securely with double-sided non-slip rug tape.  Do not have throw rugs and other things on the floor that can make you trip. What can I do in the bedroom?  Use night lights.  Make sure that you have a light by your bed that is easy to reach.  Do not use any sheets or blankets that are too big for your bed. They should not hang down onto the floor.  Have a firm chair that has side arms. You can use this for support while you get dressed.  Do not have throw rugs and other things on the floor that can make you trip. What can I do in the kitchen?  Clean up any spills right away.  Avoid walking on wet floors.  Keep items that you use a lot in easy-to-reach places.  If you need to reach something above you, use a strong step stool that has a grab bar.  Keep electrical cords out of the way.  Do not use floor polish or wax that makes floors slippery. If you must use wax, use non-skid floor wax.  Do not have throw rugs and other things on the floor that  can make you trip. What can I do with my stairs?  Do not leave any items on the stairs.  Make sure that there are handrails on both sides of the stairs and use them. Fix handrails that are broken or loose. Make sure that handrails are as long as the stairways.  Check any carpeting to make sure that it is firmly attached to the stairs. Fix any carpet that is loose or worn.  Avoid having throw rugs at the top or bottom of the stairs. If you do have throw rugs, attach them to the floor with carpet tape.  Make sure that you have a light switch at the top of the stairs and the bottom of the stairs. If you do not have them, ask someone to add them for you. What else can I do to help prevent falls?  Wear shoes that:  Do not have high heels.  Have rubber bottoms.  Are comfortable and fit you well.  Are closed at the toe. Do not wear sandals.  If  you use a stepladder:  Make sure that it is fully opened. Do not climb a closed stepladder.  Make sure that both sides of the stepladder are locked into place.  Ask someone to hold it for you, if possible.  Clearly mark and make sure that you can see:  Any grab bars or handrails.  First and last steps.  Where the edge of each step is.  Use tools that help you move around (mobility aids) if they are needed. These include:  Canes.  Walkers.  Scooters.  Crutches.  Turn on the lights when you go into a dark area. Replace any light bulbs as soon as they burn out.  Set up your furniture so you have a clear path. Avoid moving your furniture around.  If any of your floors are uneven, fix them.  If there are any pets around you, be aware of where they are.  Review your medicines with your doctor. Some medicines can make you feel dizzy. This can increase your chance of falling. Ask your doctor what other things that you can do to help prevent falls. This information is not intended to replace advice given to you by your health care provider. Make sure you discuss any questions you have with your health care provider. Document Released: 08/29/2009 Document Revised: 04/09/2016 Document Reviewed: 12/07/2014 Elsevier Interactive Patient Education  2017 [employer].

## 2021-01-23 NOTE — Progress Notes (Signed)
I,Yamilka [employer] as a Education administrator for [employer], FNP.,have documented all relevant documentation on the behalf of Catherine Brine, FNP,as directed by  Catherine Brine, FNP while in the presence of Catherine [name], [place]. This visit occurred during the SARS-CoV-2 public health emergency.  Safety protocols were in place, including screening questions prior to the visit, additional usage of staff PPE, and extensive cleaning of exam room while observing appropriate contact time as indicated for disinfecting solutions.  Subjective:     Patient ID: Catherine [name] , female    DOB: 1946-06-29 , 75 y.o.   MRN: [id number]   Chief Complaint  Patient presents with  . Diabetes  . Hypertension    HPI  Patient is here for bp dm check.  Wt Readings from Last 3 Encounters: 01/23/21 : 169 lb 5 oz (76.8 kg) 01/23/21 : 169 lb 6.4 oz (76.8 kg) 11/05/20 : 173 lb 6.4 oz (78.7 kg)  Her blood pressure at home was 112/67 this morning. Blood pressure at night was 96/62 last night. She has been baking chocolate chip cookies.  She has cut back on her chocolate milk. She is under increased stress with her husband.    Diabetes She presents for her follow-up diabetic visit. She has type 2 diabetes mellitus. Her disease course has been stable. There are no hypoglycemic associated symptoms. Pertinent negatives for hypoglycemia include no confusion, dizziness, headaches or nervousness/anxiousness. Pertinent negatives for diabetes include no blurred vision, no chest pain, no fatigue, no polydipsia, no polyphagia and no polyuria. There are no hypoglycemic complications. Symptoms are stable. There are no diabetic complications. Pertinent negatives for diabetic complications include no CVA. Risk factors for coronary artery disease include diabetes mellitus and sedentary lifestyle. Current diabetic treatment includes oral agent (dual therapy). She is compliant with treatment all of the time. Her weight is decreasing steadily.  She is following a diabetic diet. When asked about meal planning, she reported none. She has not had a previous visit with a dietitian. She rarely participates in exercise. (Blood sugar readings range  149-190. She is no longer taking the glyburide) An ACE inhibitor/angiotensin II receptor blocker is being taken. She does not see a podiatrist.Eye exam is not current.  Hypertension This is a chronic problem. The current episode started more than 1 year ago. The problem is unchanged. The problem is controlled. Pertinent negatives include no anxiety, blurred vision, chest pain, headaches or palpitations. There are no associated agents to hypertension. Risk factors for coronary artery disease include obesity and sedentary lifestyle. Past treatments include ACE inhibitors. The current treatment provides significant improvement. There are no compliance problems.  There is no history of CVA. There is no history of chronic renal disease.     Past Medical History:  Diagnosis Date  . Diabetes mellitus without complication ([place])   . Hypertension   . Thyroid disease      Family History  Problem Relation Age of Onset  . Diabetes Mellitus II Mother   . Stroke Mother   . Diabetes Mellitus II Sister   . Hypertension Sister      Current Outpatient Medications:  .  Dulaglutide (TRULICITY) 3 KV/4.2VZ SOPN, Inject 3 mg as directed once a week., Disp: 12 mL, Rfl: 1 .  Accu-Chek FastClix Lancets MISC, AS DIRECTED TWICE A DAY SUBCUTANEOUS 90 DAYS E11.9, Disp: 204 each, Rfl: 4 .  Ascorbic Acid (VITAMIN C) 1000 MG tablet, Take 1,000 mg by mouth daily., Disp: , Rfl:  .  aspirin 81 MG tablet,  Take 81 mg by mouth daily., Disp: , Rfl:  .  atorvastatin (LIPITOR) 10 MG tablet, TAKE 1 TABLET BY MOUTH EVERY DAY, Disp: 90 tablet, Rfl: 0 .  beta carotene w/minerals (OCUVITE) tablet, Take 1 tablet by mouth daily., Disp: , Rfl:  .  calcium citrate-vitamin D (CITRACAL+D) 315-200 MG-UNIT tablet, Take 1 tablet by mouth 2 (two)  times daily., Disp: , Rfl:  .  Cyanocobalamin (VITAMIN B 12 PO), Take 1,000 mg by mouth daily., Disp: , Rfl:  .  dapagliflozin propanediol (FARXIGA) 5 MG TABS tablet, Take 2 tablets (10 mg total) by mouth daily before breakfast., Disp: 30 tablet, Rfl: 3 .  ferrous sulfate 325 (65 FE) MG tablet, Take 1 tablet (325 mg total) by mouth 2 (two) times daily., Disp: 180 tablet, Rfl: 1 .  glucose blood (ACCU-CHEK GUIDE) test strip, Use to check blood sugars twice daily E11.69, Disp: 100 each, Rfl: 2 .  hydrocortisone (ANUSOL-HC) 2.5 % rectal cream, Place 1 application rectally 2 (two) times daily., Disp: 30 g, Rfl: 2 .  Insulin Pen Needle (PEN NEEDLES) 32G X 4 MM MISC, 1 each by Does not apply route once a week. Use as directed once per week with Ozempic, Disp: 30 each, Rfl: 2 .  levothyroxine (SYNTHROID) 112 MCG tablet, TAKE 1 TABLET (112 MCG TOTAL) BY MOUTH DAILY BEFORE BREAKFAST., Disp: 90 tablet, Rfl: 0 .  lisinopril (ZESTRIL) 2.5 MG tablet, TAKE 1 TABLET BY MOUTH EVERY DAY, Disp: 90 tablet, Rfl: 1 .  Multiple Vitamin (MULTIVITAMIN WITH MINERALS) TABS tablet, Take 1 tablet by mouth daily., Disp: , Rfl:  .  Multiple Vitamins-Minerals (HAIR SKIN AND NAILS FORMULA) TABS, Take 1 tablet by mouth daily., Disp: , Rfl:  .  mupirocin ointment (BACTROBAN) 2 %, APPLY TO AFFECTED AREA TWICE A DAY, Disp: 22 g, Rfl: 0 .  Omega-3 Fatty Acids (FISH OIL) 1000 MG CAPS, Take 1 capsule by mouth daily. , Disp: , Rfl:  .  Wheat Dextrin (BENEFIBER DRINK MIX PO), Take by mouth., Disp: , Rfl:    No Known Allergies   Review of Systems  Constitutional: Negative for fatigue.  Eyes: Negative for blurred vision.  Respiratory: Negative.   Cardiovascular: Negative for chest pain, palpitations and leg swelling.  Endocrine: Negative for polydipsia, polyphagia and polyuria.  Neurological: Negative for dizziness and headaches.  Psychiatric/Behavioral: Negative for confusion. The patient is not nervous/anxious.      Today's  Vitals   01/23/21 1118  BP: 138/60  Pulse: 85  Temp: 97.9 F (36.6 C)  TempSrc: Oral  Weight: 169 lb 5 oz (76.8 kg)  Height: 5' 3.2" (1.605 m)   Body mass index is 29.8 kg/m.   Objective:  Physical Exam Constitutional:      General: She is not in acute distress.    Appearance: Normal appearance. She is normal weight.  Cardiovascular:     Rate and Rhythm: Normal rate and regular rhythm.     Pulses: Normal pulses.     Heart sounds: Normal heart sounds. No murmur heard.   Pulmonary:     Effort: Pulmonary effort is normal. No respiratory distress.     Breath sounds: Normal breath sounds. No wheezing.  Abdominal:     Palpations: Abdomen is soft.  Skin:    General: Skin is warm and dry.     Capillary Refill: Capillary refill takes less than 2 seconds.  Neurological:     General: No focal deficit present.     Mental Status: She is  alert and oriented to person, place, and time.     Cranial Nerves: No cranial nerve deficit.  Psychiatric:        Mood and Affect: Mood normal.        Behavior: Behavior normal.        Thought Content: Thought content normal.        Judgment: Judgment normal.         Assessment And Plan:     1. Type 2 diabetes mellitus without complication, without long-term current use of insulin (HCC)  Chronic, she has been having slightly elevated blood sugars and will increase her Trulicity to 3 mg weekly, I have also informed the pharmacist in office.   Continue to avoid sugary foods and drinks - Dulaglutide (TRULICITY) 3 QZ/0.0PQ SOPN; Inject 3 mg as directed once a week.  Dispense: 12 mL; Refill: 1 - CMP14+EGFR - Hemoglobin A1c  2. Essential hypertension  Chronic, fair control  Continue with current medications  Continue to eat a low sodium diet - CMP14+EGFR  3. Anemia, unspecified type - ferrous sulfate 325 (65 FE) MG tablet; Take 1 tablet (325 mg total) by mouth 2 (two) times daily.  Dispense: 180 tablet; Refill: 1 - CBC  4. Mixed  hyperlipidemia  Chronic, stable  Continue with current medications, she is tolerating well - CMP14+EGFR - Lipid panel - VITAMIN D 25 Hydroxy (Vit-D Deficiency, Fractures)  5. Acquired hypothyroidism  Chronic, controlled  Continue with current medications, will make changes to medications as necessary  6. Vitamin D deficiency  Will check vitamin D level and supplement as needed.     Also encouraged to spend 15 minutes in the sun daily.  - VITAMIN D 25 Hydroxy (Vit-D Deficiency, Fractures)     Patient was given opportunity to ask questions. Patient verbalized understanding of the plan and was able to repeat key elements of the plan. All questions were answered to their satisfaction.  Catherine Brine, FNP    I, Catherine Brine, FNP, have reviewed all documentation for this visit. The documentation on 01/23/21 for the exam, diagnosis, procedures, and orders are all accurate and complete.   THE PATIENT IS ENCOURAGED TO PRACTICE SOCIAL DISTANCING DUE TO THE COVID-19 PANDEMIC.

## 2021-01-23 NOTE — Progress Notes (Signed)
This visit occurred during the SARS-CoV-2 public health emergency.  Safety protocols were in place, including screening questions prior to the visit, additional usage of staff PPE, and extensive cleaning of exam room while observing appropriate contact time as indicated for disinfecting solutions.  Subjective:   Catherine [name] is a 75 y.o. female who presents for Medicare Annual (Subsequent) preventive examination.  Review of Systems     Cardiac Risk Factors include: advanced age (>72men, >89 women);diabetes mellitus;hypertension;sedentary lifestyle     Objective:    Today's Vitals   01/23/21 1058  BP: 138/60  Pulse: 85  Temp: 97.9 F (36.6 C)  TempSrc: Oral  SpO2: 99%  Weight: 169 lb 6.4 oz (76.8 kg)  Height: 5' 3.2" (1.605 m)   Body mass index is 29.82 kg/m.  Advanced Directives 01/23/2021 01/18/2020 10/04/2019 08/17/2018 12/21/2017 03/16/2017 08/13/2014  Does Patient Have a Medical Advance Directive? Yes Yes Yes Yes No Yes No  Type of Estate agent of [place];Living will Healthcare Power of [place];Living will Healthcare Power of [place];Living will - - - -  Does patient want to make changes to medical advance directive? - - - - - Yes (MAU/Ambulatory/Procedural Areas - Information given) -  Copy of Healthcare Power of Attorney in Chart? No - copy requested No - copy requested No - copy requested - - - -    Current Medications (verified) Outpatient Encounter Medications as of 01/23/2021  Medication Sig  . Accu-Chek FastClix Lancets MISC AS DIRECTED TWICE A DAY SUBCUTANEOUS 90 DAYS E11.9  . Ascorbic Acid (VITAMIN C) 1000 MG tablet Take 1,000 mg by mouth daily.  [name] Kitchen aspirin 81 MG tablet Take 81 mg by mouth daily.  [name] Kitchen atorvastatin (LIPITOR) 10 MG tablet TAKE 1 TABLET BY MOUTH EVERY DAY  . beta carotene w/minerals (OCUVITE) tablet Take 1 tablet by mouth daily.  . calcium citrate-vitamin D (CITRACAL+D) 315-200 MG-UNIT tablet Take 1 tablet by mouth 2 (two) times  daily.  . Cyanocobalamin (VITAMIN B 12 PO) Take 1,000 mg by mouth daily.  . Dulaglutide (TRULICITY) 1.5 MG/0.5ML SOPN Inject 0.5 mLs (1.5 mg total) into the skin once a week.  . ferrous sulfate 325 (65 FE) MG tablet Take 1 tablet (325 mg total) by mouth 2 (two) times daily.  [name] Kitchen glucose blood (ACCU-CHEK GUIDE) test strip Use to check blood sugars twice daily E11.69  . hydrocortisone (ANUSOL-HC) 2.5 % rectal cream Place 1 application rectally 2 (two) times daily.  . Insulin Pen Needle (PEN NEEDLES) 32G X 4 MM MISC 1 each by Does not apply route once a week. Use as directed once per week with Ozempic  . levothyroxine (SYNTHROID) 112 MCG tablet TAKE 1 TABLET (112 MCG TOTAL) BY MOUTH DAILY BEFORE BREAKFAST.  [name] Kitchen lisinopril (ZESTRIL) 2.5 MG tablet TAKE 1 TABLET BY MOUTH EVERY DAY  . Multiple Vitamin (MULTIVITAMIN WITH MINERALS) TABS tablet Take 1 tablet by mouth daily.  . Multiple Vitamins-Minerals (HAIR SKIN AND NAILS FORMULA) TABS Take 1 tablet by mouth daily.  . mupirocin ointment (BACTROBAN) 2 % APPLY TO AFFECTED AREA TWICE A DAY  . Omega-3 Fatty Acids (FISH OIL) 1000 MG CAPS Take 1 capsule by mouth daily.   . Wheat Dextrin (BENEFIBER DRINK MIX PO) Take by mouth.   No facility-administered encounter medications on file as of 01/23/2021.    Allergies (verified) Patient has no known allergies.   History: Past Medical History:  Diagnosis Date  . Diabetes mellitus without complication (HCC)   . Hypertension   .  Thyroid disease    Past Surgical History:  Procedure Laterality Date  . CHOLECYSTECTOMY     Family History  Problem Relation Age of Onset  . Diabetes Mellitus II Mother   . Stroke Mother   . Diabetes Mellitus II Sister   . Hypertension Sister    Social History   Socioeconomic History  . Marital status: Married    Spouse name: Not on file  . Number of children: Not on file  . Years of education: Not on file  . Highest education level: Not on file  Occupational History  .  Occupation: retired  Tobacco Use  . Smoking status: Never Smoker  . Smokeless tobacco: Never Used  Vaping Use  . Vaping Use: Never used  Substance and Sexual Activity  . Alcohol use: No  . Drug use: No  . Sexual activity: Not Currently  Other Topics Concern  . Not on file  Social History Narrative  . Not on file   Social Determinants of Health   Financial Resource Strain: Low Risk   . Difficulty of Paying Living Expenses: Not hard at all  Food Insecurity: No Food Insecurity  . Worried About Programme researcher, broadcasting/film/video in the Last Year: Never true  . Ran Out of Food in the Last Year: Never true  Transportation Needs: No Transportation Needs  . Lack of Transportation (Medical): No  . Lack of Transportation (Non-Medical): No  Physical Activity: Inactive  . Days of Exercise per Week: 0 days  . Minutes of Exercise per Session: 0 min  Stress: No Stress Concern Present  . Feeling of Stress : Not at all  Social Connections: Not on file    Tobacco Counseling Counseling given: Not Answered   Clinical Intake:  Pre-visit preparation completed: Yes  Pain : No/denies pain     Nutritional Status: BMI 25 -29 Overweight Nutritional Risks: None Diabetes: Yes  How often do you need to have someone help you when you read instructions, pamphlets, or other written materials from your doctor or pharmacy?: 1 - Never What is the last grade level you completed in school?: some vocational  Diabetic? Yes Nutrition Risk Assessment:  Has the patient had any N/V/D within the last 2 months?  No  Does the patient have any non-healing wounds?  No  Has the patient had any unintentional weight loss or weight gain?  No   Diabetes:  Is the patient diabetic?  Yes  If diabetic, was a CBG obtained today?  No  Did the patient bring in their glucometer from home?  No  How often do you monitor your CBG's? Twice daily.   Financial Strains and Diabetes Management:  Are you having any financial strains  with the device, your supplies or your medication? No .  Does the patient want to be seen by Chronic Care Management for management of their diabetes?  No  Would the patient like to be referred to a Nutritionist or for Diabetic Management?  No   Diabetic Exams:  Diabetic Eye Exam: Completed 11/13/2020 Diabetic Foot Exam: Completed 08/06/2020   Interpreter Needed?: No  Information entered by :: NAllen LPN   Activities of Daily Living In your present state of health, do you have any difficulty performing the following activities: 01/23/2021  Hearing? N  Vision? N  Difficulty concentrating or making decisions? N  Walking or climbing stairs? N  Dressing or bathing? N  Doing errands, shopping? N  Preparing Food and eating ? N  Using the  Toilet? N  In the past six months, have you accidently leaked urine? N  Do you have problems with loss of bowel control? N  Managing your Medications? N  Managing your Finances? N  Housekeeping or managing your Housekeeping? N  Some recent data might be hidden    Patient Care Team: [name], FNP as PCP - General (General Practice) Johney Maine, MD as Consulting Physician (Hematology) Harlan Stains, [facility] (Pharmacist)  Indicate any recent Medical Services you may have received from other than [facility] providers in the past year (date may be approximate).     Assessment:   This is a routine wellness examination for [place].  Hearing/Vision screen  Hearing Screening   125Hz  250Hz  500Hz  1000Hz  2000Hz  3000Hz  4000Hz  6000Hz  8000Hz   Right ear:           Left ear:           Vision Screening Comments: Regular eye exams, Dr.  Dietary issues and exercise activities discussed: Current Exercise Habits: The patient does not participate in regular exercise at present  Goals    .  HEMOGLOBIN A1C < 7.0 (pt-stated)    .  Patient Stated      10/04/2019, wants to stay healthy and avoid the virus    .  Patient Stated      01/18/2020,  wants to maintain health and make own decisions    .  Patient Stated      01/23/2021, get blood sugars under control    .  Pharmacy Care Plan      CARE PLAN ENTRY (see longitudinal plan of care for additional care plan information)  Current Barriers:  . Chronic Disease Management support, education, and care coordination needs related to Hypertension, Hyperlipidemia, and Diabetes   Hypertension BP Readings from Last 3 Encounters:  11/05/20 124/68  09/13/20 120/74  08/06/20 124/80   . Pharmacist Clinical Goal(s): o Over the next 90 days, patient will work with PharmD and providers to maintain BP goal <130/80 . Current regimen:  o Lisinopril 2.5mg  daily . Interventions: o Reviewed recent home blood pressure readings o Commended her on her diet and exercise lifestyle changes . Patient self care activities - Over the next 180 days, patient will: o Check BP twice daily, document, and provide at future appointments o Ensure daily salt intake < 2300 mg/day o Try to exercise for 30 minutes daily 5 times per week (150 minutes per week total)  Hyperlipidemia Lab Results  Component Value Date/Time   LDLCALC 88 11/05/2020 12:51 PM   . Pharmacist Clinical Goal(s): o Over the next 90 days, patient will work with PharmD and providers to achieve LDL goal < 70 . Current regimen:  o Atorvastatin 10mg  daily . Interventions: o Provided dietary and exercise recommendations o Discussed appropriate goals for LDL  . Patient self care activities - Over the next 90 days, patient will: o Try to exercise for 30 minutes daily 5 times per week (150 minutes per week total) o Focus on eating a healthy, well-balanced diet   Diabetes Lab Results  Component Value Date/Time   HGBA1C 7.2 (H) 11/05/2020 12:51 PM   HGBA1C 6.2 (H) 08/06/2020 03:02 PM   . Pharmacist Clinical Goal(s): o Over the next 90 days, patient will work with PharmD and providers to achieve A1c goal <7% . Current regimen:   . Trulicity 1.5mg  weekly on Tuesdays  . Interventions: o Discussed appropriate goals for fasting blood sugar (80-130) and 2 hours after eating (  less than 180) o Will need to re-enroll patient for [employer] for 2022 . Patient self care activities - Over the next 90 days, patient will: o Check blood sugar twice daily, document, and provide at future appointments o Start limiting True Moo chocolate milk to a small bottle once a month  o Contact provider with any episodes of hypoglycemia o Try to exercise for 30 minutes daily 5 times per week (150 minutes per week total)  Medication management . Pharmacist Clinical Goal(s): o Over the next 180 days, patient will work with PharmD and providers to maintain optimal medication adherence . Current pharmacy: [facility] . Interventions o Comprehensive medication review performed. o Continue current medication management strategy . Patient self care activities - Over the next 180 days, patient will: o Focus on medication adherence by continued use of pill box o Take medications as prescribed o Report any questions or concerns to PharmD and/or provider(s)  Please see past updates related to this goal by clicking on the "Past Updates" button in the selected goal        Depression Screen PHQ 2/9 Scores 01/23/2021 12/10/2020 01/18/2020 01/18/2020 10/04/2019 10/04/2019 08/24/2019  PHQ - 2 Score 0 2 0 0 0 0 0  PHQ- 9 Score - 7 0 - 0 - -    Fall Risk Fall Risk  01/23/2021 01/18/2020 01/18/2020 10/04/2019 10/04/2019  Falls in the past year? 0 0 0 0 0  Comment - - - - -  Risk for fall due to : Medication side effect Medication side effect - Medication side effect -  Follow up Falls evaluation completed;Education provided;Falls prevention discussed - - Falls evaluation completed;Education provided;Falls prevention discussed -    FALL RISK PREVENTION PERTAINING TO THE HOME:  Any stairs in or around the home? Yes  If so, are there any without handrails? Yes   Home free of loose throw rugs in walkways, pet beds, electrical cords, etc? Yes  Adequate lighting in your home to reduce risk of falls? Yes   ASSISTIVE DEVICES UTILIZED TO PREVENT FALLS:  Life alert? No  Use of a cane, walker or w/c? No  Grab bars in the bathroom? No  Shower chair or bench in shower? Yes  Elevated toilet seat or a handicapped toilet? No   TIMED UP AND GO:  Was the test performed? No . .   Gait steady and fast without use of assistive device  Cognitive Function:     6CIT Screen 01/23/2021 01/18/2020 10/04/2019 08/17/2018  What Year? 0 points 0 points 0 points 0 points  What month? 0 points 0 points 0 points 0 points  What time? 0 points 0 points 0 points 0 points  Count back from 20 0 points 0 points 0 points 4 points  Months in reverse 0 points 0 points 0 points 0 points  Repeat phrase 0 points 2 points 2 points 0 points  Total Score 0 2 2 4     Immunizations Immunization History  Administered Date(s) Administered  . Fluad Quad(high Dose 65+) 08/11/2019, 09/13/2020  . Influenza, High Dose Seasonal PF 08/08/2018  . Influenza,inj,Quad PF,6+ Mos 08/16/2014  . Influenza-Unspecified 08/08/2018, 08/11/2019  . PFIZER(Purple Top)SARS-COV-2 Vaccination 12/21/2019, 01/11/2020, 10/23/2020  . Pneumococcal Conjugate-13 10/02/2020  . Pneumococcal Polysaccharide-23 08/24/2019  . Tdap 09/18/2019    TDAP status: Up to date  Flu Vaccine status: Up to date  Pneumococcal vaccine status: Up to date  Covid-19 vaccine status: Completed vaccines  Qualifies for Shingles Vaccine? Yes   Zostavax  completed No   Shingrix Completed?: No.    Education has been provided regarding the importance of this vaccine. Patient has been advised to call insurance company to determine out of pocket expense if they have not yet received this vaccine. Advised may also receive vaccine at local pharmacy or Health Dept. Verbalized acceptance and understanding.  Screening Tests Health  Maintenance  Topic Date Due  . OPHTHALMOLOGY EXAM  Never done  . HEMOGLOBIN A1C  05/06/2021  . FOOT EXAM  08/06/2021  . MAMMOGRAM  10/02/2021  . COLONOSCOPY (Pts [phone] Insurance coverage will need to be confirmed)  02/25/2026  . TETANUS/TDAP  09/17/2029  . INFLUENZA VACCINE  Completed  . DEXA SCAN  Completed  . COVID-19 Vaccine  Completed  . Hepatitis C Screening  Completed  . PNA vac Low Risk Adult  Completed  . HPV VACCINES  Aged Out    Health Maintenance  Health Maintenance Due  Topic Date Due  . OPHTHALMOLOGY EXAM  Never done    Colorectal cancer screening: Type of screening: Colonoscopy. Completed 02/26/2016. Repeat every 10 years  Mammogram status: patient to schedule  Bone Density status: Completed 10/03/2019. Results reflect: Bone density results: OSTEOPENIA. Repeat every 2 years.  Lung Cancer Screening: (Low Dose CT Chest recommended if Age 3-80 years, 30 pack-year currently smoking OR have quit w/in 15years.) does not qualify.   Lung Cancer Screening Referral: no  Additional Screening:  Hepatitis C Screening: does qualify; Completed 08/06/2020  Vision Screening: Recommended annual ophthalmology exams for early detection of glaucoma and other disorders of the eye. Is the patient up to date with their annual eye exam?  Yes  Who is the provider or what is the name of the office in which the patient attends annual eye exams? Dr. Dione BoozeGroat If pt is not established with a provider, would they like to be referred to a provider to establish care? No .   Dental Screening: Recommended annual dental exams for proper oral hygiene  Community Resource Referral / Chronic Care Management: CRR required this visit?  No   CCM required this visit?  No      Plan:     I have personally reviewed and noted the following in the patient's chart:   . Medical and social history . Use of alcohol, tobacco or illicit drugs  . Current medications and supplements . Functional  ability and status . Nutritional status . Physical activity . Advanced directives . List of other physicians . Hospitalizations, surgeries, and ER visits in previous 12 months . Vitals . Screenings to include cognitive, depression, and falls . Referrals and appointments  In addition, I have reviewed and discussed with patient certain preventive protocols, quality metrics, and best practice recommendations. A written personalized care plan for preventive services as well as general preventive health recommendations were provided to patient.     Barb Merinoickeah E Allen, LPN   1/61/09603/08/2021   Nurse Notes:

## 2021-01-24 LAB — CMP14+EGFR
ALT: 30 IU/L (ref 0–32)
AST: 24 IU/L (ref 0–40)
Albumin/Globulin Ratio: 2.1 (ref 1.2–2.2)
Albumin: 4.6 g/dL (ref 3.7–4.7)
Alkaline Phosphatase: 89 IU/L (ref 44–121)
BUN/Creatinine Ratio: 15 (ref 12–28)
BUN: 18 mg/dL (ref 8–27)
Bilirubin Total: 0.6 mg/dL (ref 0.0–1.2)
CO2: 21 mmol/L (ref 20–29)
Calcium: 9.5 mg/dL (ref 8.7–10.3)
Chloride: 101 mmol/L (ref 96–106)
Creatinine, Ser: 1.22 mg/dL — ABNORMAL HIGH (ref 0.57–1.00)
Globulin, Total: 2.2 g/dL (ref 1.5–4.5)
Glucose: 247 mg/dL — ABNORMAL HIGH (ref 65–99)
Potassium: 4.1 mmol/L (ref 3.5–5.2)
Sodium: 137 mmol/L (ref 134–144)
Total Protein: 6.8 g/dL (ref 6.0–8.5)
eGFR: 47 mL/min/{1.73_m2} — ABNORMAL LOW (ref >59)

## 2021-01-24 LAB — LIPID PANEL
Chol/HDL Ratio: 3 ratio (ref 0.0–4.4)
Cholesterol, Total: 160 mg/dL (ref 100–199)
HDL: 54 mg/dL (ref >39)
LDL Chol Calc (NIH): 88 mg/dL (ref 0–99)
Triglycerides: 101 mg/dL (ref 0–149)
VLDL Cholesterol Cal: 18 mg/dL (ref 5–40)

## 2021-01-24 LAB — CBC
Hematocrit: 32.3 % — ABNORMAL LOW (ref 34.0–46.6)
Hemoglobin: 11 g/dL — ABNORMAL LOW (ref 11.1–15.9)
MCH: 30.1 pg (ref 26.6–33.0)
MCHC: 34.1 g/dL (ref 31.5–35.7)
MCV: 89 fL (ref 79–97)
Platelets: 211 10*3/uL (ref 150–450)
RBC: 3.65 x10E6/uL — ABNORMAL LOW (ref 3.77–5.28)
RDW: 13.1 % (ref 11.7–15.4)
WBC: 3.6 10*3/uL (ref 3.4–10.8)

## 2021-01-24 LAB — HEMOGLOBIN A1C
Est. average glucose Bld gHb Est-mCnc: 160 mg/dL
Hgb A1c MFr Bld: 7.2 % — ABNORMAL HIGH (ref 4.8–5.6)

## 2021-01-24 LAB — VITAMIN D 25 HYDROXY (VIT D DEFICIENCY, FRACTURES): Vit D, 25-Hydroxy: 34.5 ng/mL (ref 30.0–100.0)

## 2021-01-28 ENCOUNTER — Other Ambulatory Visit: Payer: Self-pay | Admitting: Nurse Practitioner

## 2021-01-28 DIAGNOSIS — E119 Type 2 diabetes mellitus without complications: Secondary | ICD-10-CM

## 2021-01-28 MED ORDER — DAPAGLIFLOZIN PROPANEDIOL 5 MG PO TABS: 10.0000 mg | ORAL_TABLET | Freq: Every day | ORAL | 3 refills | Status: DC

## 2021-01-29 ENCOUNTER — Other Ambulatory Visit: Payer: Self-pay | Admitting: Nurse Practitioner

## 2021-01-29 NOTE — Progress Notes (Signed)
I have sent it in, she needs to call to see how much it is and she can come and get samples to get started.

## 2021-02-16 ENCOUNTER — Other Ambulatory Visit: Payer: Self-pay | Admitting: Nurse Practitioner

## 2021-02-26 ENCOUNTER — Encounter: Payer: Self-pay | Admitting: Nurse Practitioner

## 2021-03-10 ENCOUNTER — Telehealth: Payer: Self-pay

## 2021-03-10 NOTE — Chronic Care Management (AMB) (Signed)
    Chronic Care Management Pharmacy Assistant   Name: Catherine [name]  MRN: [id number] DOB: 1946/06/13   Reason for Encounter: Appointment Reminder Call    Recent office visits:  01/23/21-[name] Christell Constant, FNP(PCP) Increase Trulicity to 3 mg weekly  Recent consult visits:  None noted  Hospital visits:  None in previous 6 months  Medications: Outpatient Encounter Medications as of 03/10/2021  Medication Sig Note  . Accu-Chek FastClix Lancets MISC AS DIRECTED TWICE A DAY SUBCUTANEOUS 90 DAYS E11.9   . Ascorbic Acid (VITAMIN C) 1000 MG tablet Take 1,000 mg by mouth daily.   [name] Kitchen aspirin 81 MG tablet Take 81 mg by mouth daily.   [name] Kitchen atorvastatin (LIPITOR) 10 MG tablet TAKE 1 TABLET BY MOUTH EVERY DAY   . beta carotene w/minerals (OCUVITE) tablet Take 1 tablet by mouth daily. 08/17/2018: Uses preservision  . calcium citrate-vitamin D (CITRACAL+D) 315-200 MG-UNIT tablet Take 1 tablet by mouth 2 (two) times daily.   . Cyanocobalamin (VITAMIN B 12 PO) Take 1,000 mg by mouth daily.   . dapagliflozin propanediol (FARXIGA) 5 MG TABS tablet Take 2 tablets (10 mg total) by mouth daily before breakfast.   . Dulaglutide (TRULICITY) 3 MG/0.5ML SOPN Inject 3 mg as directed once a week.   . ferrous sulfate 325 (65 FE) MG tablet Take 1 tablet (325 mg total) by mouth 2 (two) times daily.   [name] Kitchen glucose blood (ACCU-CHEK GUIDE) test strip Use to check blood sugars twice daily E11.69   . hydrocortisone (ANUSOL-HC) 2.5 % rectal cream Place 1 application rectally 2 (two) times daily.   . Insulin Pen Needle (PEN NEEDLES) 32G X 4 MM MISC 1 each by Does not apply route once a week. Use as directed once per week with Ozempic   . levothyroxine (SYNTHROID) 112 MCG tablet TAKE 1 TABLET (112 MCG TOTAL) BY MOUTH DAILY BEFORE BREAKFAST.   [name] Kitchen lisinopril (ZESTRIL) 2.5 MG tablet TAKE 1 TABLET BY MOUTH EVERY DAY   . Multiple Vitamin (MULTIVITAMIN WITH MINERALS) TABS tablet Take 1 tablet by mouth daily.   . Multiple  Vitamins-Minerals (HAIR SKIN AND NAILS FORMULA) TABS Take 1 tablet by mouth daily.   . mupirocin ointment (BACTROBAN) 2 % APPLY TO AFFECTED AREA TWICE A DAY   . Omega-3 Fatty Acids (FISH OIL) 1000 MG CAPS Take 1 capsule by mouth daily.    . Wheat Dextrin (BENEFIBER DRINK MIX PO) Take by mouth.    No facility-administered encounter medications on file as of 03/10/2021.    Tried calling patient to remind her of appointment on 04/206/22 at 3:00PM. No voicemail available.   Lura Em Clinical Pharmacist Assistant [phone]

## 2021-03-11 ENCOUNTER — Telehealth: Payer: Federal, State, Local not specified - PPO

## 2021-03-11 NOTE — Chronic Care Management (AMB) (Signed)
03/11/2021- Called patient to reschedule appointment today 03/11/2021 with PharmD due to family emergency. Patient rescheduled to 03/13/2021 at 12:15 pm. Message sent to Gwenevere Ghazi, CCM scheduler, Cherylin Mylar, CPP notified.  Billee Cashing, [facility] Health Concierge [phone]

## 2021-03-12 ENCOUNTER — Telehealth: Payer: Self-pay

## 2021-03-12 NOTE — Progress Notes (Signed)
Called pt for appointment reminder. Unable to reach due to busy signal.  [name] [facility] Health Concierge  [phone]

## 2021-03-13 ENCOUNTER — Ambulatory Visit (INDEPENDENT_AMBULATORY_CARE_PROVIDER_SITE_OTHER): Payer: Medicare Other

## 2021-03-13 DIAGNOSIS — E782 Mixed hyperlipidemia: Secondary | ICD-10-CM | POA: Diagnosis not present

## 2021-03-13 DIAGNOSIS — E119 Type 2 diabetes mellitus without complications: Secondary | ICD-10-CM | POA: Diagnosis not present

## 2021-03-13 NOTE — Progress Notes (Signed)
Chronic Care Management Pharmacy Note  03/14/2021 Name:  Catherine [name] MRN:  [id number] DOB:  [birth year]  Subjective: Catherine [name] is an 75 y.o. year old female who is a primary patient of [name], [place].  The CCM team was consulted for assistance with disease management and care coordination needs.    Engaged with patient by telephone for follow up visit in response to provider referral for pharmacy case management and/or care coordination services.   Consent to Services:  The patient was given information about Chronic Care Management services, agreed to services, and gave verbal consent prior to initiation of services.  Please see initial visit note for detailed documentation.   Patient Care Team: [name], FNP as PCP - General (General Practice) Brunetta Genera, MD as Consulting Physician (Hematology) Mayford Knife, [facility] (Pharmacist)  Recent office visits: 01/23/2021 PCP [facility] visits: None in previous 6 months  Objective:  Lab Results  Component Value Date   CREATININE 1.22 (H) 01/23/2021   BUN 18 01/23/2021   GFRNONAA 42 (L) 11/05/2020   GFRAA 48 (L) 11/05/2020   NA 137 01/23/2021   K 4.1 01/23/2021   CALCIUM 9.5 01/23/2021   CO2 21 01/23/2021   GLUCOSE 247 (H) 01/23/2021    Lab Results  Component Value Date/Time   HGBA1C 7.2 (H) 01/23/2021 11:54 AM   HGBA1C 7.2 (H) 11/05/2020 12:51 PM   MICROALBUR 10 08/06/2020 01:40 PM   MICROALBUR 10 10/05/2019 12:37 PM    Last diabetic Eye exam:  Lab Results  Component Value Date/Time   HMDIABEYEEXA No Retinopathy 11/13/2020 12:00 AM    Last diabetic Foot exam: No results found for: HMDIABFOOTEX   Lab Results  Component Value Date   CHOL 160 01/23/2021   HDL 54 01/23/2021   LDLCALC 88 01/23/2021   TRIG 101 01/23/2021   CHOLHDL 3.0 01/23/2021    Hepatic Function Latest Ref Rng & Units 01/23/2021 08/06/2020 04/22/2020  Total Protein 6.0 - 8.5 g/dL 6.8 6.9 6.5  Albumin 3.7 - 4.7 g/dL  4.6 4.8(H) 4.5  AST 0 - 40 IU/L _0 ALT 0 - 32 IU/L 30 35(H) 28  Alk Phosphatase 44 - 121 IU/L 89 88 78  Total Bilirubin 0.0 - 1.2 mg/dL 0.6 0.5 0.6    Lab Results  Component Value Date/Time   TSH 1.500 11/05/2020 12:51 PM   TSH 0.868 08/06/2020 03:02 PM   FREET4 1.42 10/05/2019 08:55 AM   FREET4 1.50 05/10/2019 10:37 AM    CBC Latest Ref Rng & Units 01/23/2021 08/06/2020 11/17/2018  WBC 3.4 - 10.8 x10E3/uL 3.6 4.4 4.0  Hemoglobin 11.1 - 15.9 g/dL 11.0(L) 11.4 10.4(L)  Hematocrit 34.0 - 46.6 % 32.3(L) 33.7(L) 29.8(L)  Platelets 150 - 450 x10E3/uL 211 215 188    Lab Results  Component Value Date/Time   VD25OH 34.5 01/23/2021 11:54 AM   VD25OH 33.6 11/05/2020 12:51 PM    Clinical ASCVD: No  The 10-year ASCVD risk score Mikey Bussing DC Jr., et al., 2013) is: 28%   Values used to calculate the score:     Age: 45 years     Sex: Female     Is Non-Hispanic African American: Yes     Diabetic: Yes     Tobacco smoker: No     Systolic Blood Pressure: 563 mmHg     Is BP treated: Yes     HDL Cholesterol: 54 mg/dL     Total Cholesterol: 160 mg/dL  Depression screen PHQ 2/9 01/23/2021 12/10/2020 01/18/2020  Decreased Interest 0 1 0  Down, Depressed, Hopeless 0 1 0  PHQ - 2 Score 0 2 0  Altered sleeping - 1 0  Tired, decreased energy - 1 0  Change in appetite - 1 0  Feeling bad or failure about yourself  - 1 0  Trouble concentrating - 1 0  Moving slowly or fidgety/restless - 0 0  Suicidal thoughts - 0 0  PHQ-9 Score - 7 0  Difficult doing work/chores - - Not difficult at all     Social History   Tobacco Use  Smoking Status Never Smoker  Smokeless Tobacco Never Used   BP Readings from Last 3 Encounters:  01/23/21 138/60  01/23/21 138/60  11/05/20 124/68   Pulse Readings from Last 3 Encounters:  01/23/21 85  01/23/21 85  11/05/20 72   Wt Readings from Last 3 Encounters:  01/23/21 169 lb 5 oz (76.8 kg)  01/23/21 169 lb 6.4 oz (76.8 kg)  11/05/20 173 lb 6.4 oz (78.7  kg)   BMI Readings from Last 3 Encounters:  01/23/21 29.80 kg/m  01/23/21 29.82 kg/m  11/05/20 29.21 kg/m    Assessment/Interventions: Review of patient past medical history, allergies, medications, health status, including review of consultants reports, laboratory and other test data, was performed as part of comprehensive evaluation and provision of chronic care management services.   SDOH:  (Social Determinants of Health) assessments and interventions performed: No  SDOH Screenings   Alcohol Screen: Not on file  Depression (PHQ2-9): Low Risk   . PHQ-2 Score: 0  Financial Resource Strain: Low Risk   . Difficulty of Paying Living Expenses: Not hard at all  Food Insecurity: No Food Insecurity  . Worried About Running Out of Food in the Last Year: Never true  . Ran Out of Food in the Last Year: Never true  Housing: Not on file  Physical Activity: Inactive  . Days of Exercise per Week: 0 days  . Minutes of Exercise per Session: 0 min  Social Connections: Not on file  Stress: No Stress Concern Present  . Feeling of Stress : Not at all  Tobacco Use: Low Risk   . Smoking Tobacco Use: Never Smoker  . Smokeless Tobacco Use: Never Used  Transportation Needs: No Transportation Needs  . Lack of Transportation (Medical): No  . Lack of Transportation (Non-Medical): No    CCM Care Plan  No Known Allergies  Medications Reviewed Today    Reviewed by ,  J, RPH (Pharmacist) on 03/13/21 at 1406  Med List Status: <None>  Medication Order Taking? Sig Documenting Provider Last Dose Status Informant  Accu-Chek FastClix Lancets MISC [id number] Yes AS DIRECTED TWICE A DAY SUBCUTANEOUS 90 DAYS E11.9 Moore, [name], FNP Taking Active   Ascorbic Acid (VITAMIN C) 1000 MG tablet [id number] Yes Take 1,000 mg by mouth daily. [provider] Taking Active   aspirin 81 MG tablet [id number] Yes Take 81 mg by mouth daily. [provider] Taking Active Multiple Informants   atorvastatin (LIPITOR) 10 MG tablet [id number] Yes TAKE 1 TABLET BY MOUTH EVERY DAY Moore, [name], FNP Taking Active   beta carotene w/minerals (OCUVITE) tablet [id number] Yes Take 1 tablet by mouth daily. [provider] Taking Active Multiple Informants           Med Note (ALLEN, NICKEAH E   Wed Aug 17, 2018  9:06 AM) Uses preservision  calcium citrate-vitamin D (CITRACAL+D) 315-200 MG-UNIT tablet [id number] Yes Take   1 tablet by mouth 2 (two) times daily. [provider] Taking Active   Cyanocobalamin (VITAMIN B 12 PO) [id number] Yes Take 1,000 mg by mouth daily. [provider] Taking Active Multiple Informants  dapagliflozin propanediol (FARXIGA) 5 MG TABS tablet [id number] Yes Take 2 tablets (10 mg total) by mouth daily before breakfast. Moore, [name], FNP Taking Active   Dulaglutide (TRULICITY) 3 MG/0.5ML SOPN [id number] Yes Inject 3 mg as directed once a week. Moore, [name], FNP Taking Active   ferrous sulfate 325 (65 FE) MG tablet [id number] Yes Take 1 tablet (325 mg total) by mouth 2 (two) times daily. Moore, [name], FNP Taking Active   glucose blood (ACCU-CHEK GUIDE) test strip [id number] Yes Use to check blood sugars twice daily E11.69 Moore, [name], FNP Taking Active   hydrocortisone (ANUSOL-HC) 2.5 % rectal cream [id number] Yes Place 1 application rectally 2 (two) times daily. Moore, [name], FNP Taking Active   Insulin Pen Needle (PEN NEEDLES) 32G X 4 MM MISC [id number] Yes 1 each by Does not apply route once a week. Use as directed once per week with Ozempic Moore, [name], FNP Taking Active   levothyroxine (SYNTHROID) 112 MCG tablet [id number] Yes TAKE 1 TABLET (112 MCG TOTAL) BY MOUTH DAILY BEFORE BREAKFAST. Moore, [name], FNP Taking Active   lisinopril (ZESTRIL) 2.5 MG tablet [id number] Yes TAKE 1 TABLET BY MOUTH EVERY DAY Moore, [name], FNP Taking Active   Multiple Vitamin (MULTIVITAMIN WITH MINERALS) TABS tablet [id number] Yes Take 1 tablet by mouth daily. [provider] Taking Active   Multiple Vitamins-Minerals (HAIR SKIN AND NAILS FORMULA) TABS [id number] Yes Take 1 tablet by mouth daily. [provider] Taking Active Self  mupirocin ointment (BACTROBAN) 2 % [id number] Yes APPLY TO AFFECTED AREA TWICE A DAY Moore, [name], FNP Taking Active   Omega-3 Fatty Acids (FISH OIL) 1000 MG CAPS [id number] Yes Take 1 capsule by mouth daily.  [provider] Taking Active Self  Wheat Dextrin (BENEFIBER DRINK MIX PO) [id number] Yes Take by mouth. [provider] Taking Active           Patient Active Problem List   Diagnosis Date Noted  . Type 2 diabetes mellitus without complication, without long-term current use of insulin (HCC) 02/06/2019  . Essential hypertension 02/06/2019  . Hordeolum externum of left upper eyelid 12/14/2018  . Benign hypertension with chronic kidney disease, stage III (HCC) 08/03/2018  . Diabetes type 2, controlled 08/23/2014  . Anemia 08/23/2014  . Hypokalemia 08/23/2014  . CAP (community acquired pneumonia)--treated 08/23/2014  . Physical debility 08/16/2014  . Pneumonia 08/13/2014  . DKA, type 2 (HCC) 08/13/2014  . Acute renal failure (HCC) 08/13/2014  . Hypernatremia 08/13/2014  . Elevated LFTs 08/13/2014  . Acquired hypothyroidism 08/13/2014  . DKA (diabetic ketoacidoses) 08/13/2014    Immunization History  Administered Date(s) Administered  . Fluad Quad(high Dose 65+) 08/11/2019, 09/13/2020  . Influenza, High Dose Seasonal PF 08/08/2018  . Influenza,inj,Quad PF,6+ Mos 08/16/2014  . Influenza-Unspecified 08/08/2018, 08/11/2019  . PFIZER(Purple Top)SARS-COV-2 Vaccination 12/21/2019, 01/11/2020, 10/23/2020  . Pneumococcal Conjugate-13 10/02/2020  . Pneumococcal Polysaccharide-23 08/24/2019  . Tdap 09/18/2019    Conditions to be addressed/monitored:  Hypertension, Hyperlipidemia and Diabetes  Care Plan : CCM Pharmacy Care Plan  Updates made by ,  J, RPH since 03/14/2021  12:00 AM    Problem: DM II , HTN   Priority: High    Long-Range Goal: Disease Management   This Visit's Progress: On track  Priority: High  Note:      Current Barriers:  . Unable to independently monitor therapeutic efficacy  Pharmacist Clinical Goal(s):  [name] Kitchen Patient will achieve adherence to monitoring guidelines and medication adherence to achieve therapeutic efficacy through collaboration with PharmD and provider.   Interventions: . 1:1 collaboration with [name], FNP regarding development and update of comprehensive plan of care as evidenced by provider attestation and co-signature . Inter-disciplinary care team collaboration (see longitudinal plan of care) . Comprehensive medication review performed; medication list updated in electronic medical record  Hypertension (BP goal <130/80) -Controlled -Current treatment: . Lisinopril 2.5 mg tablet once per day.  -Current home readings: Checking BP 2 times per day. 96/58 pulse:66, 109/60-62, 110/59 - 67,  -Current dietary habits: using Mrs. Dash to season her food -Current exercise habits: she is walking around the house a lot -Denies hypotensive/hypertensive symptoms -Patient reports losing weight -Educated on BP goals and benefits of medications for prevention of heart attack, stroke and kidney damage; Daily salt intake goal < 2300 mg; Importance of home blood pressure monitoring; Proper BP monitoring technique;  -Counseled to monitor BP at home one time per day, document, and provide log at future appointments -Recommended to continue current medication  Diabetes (A1c goal <7%) -Uncontrolled -Current medications: . Farxiga 5 mg - take 2 tablets daily  . Trulicity 3 GB/1.5VV - inject 3 mg once a week on Tuesday  -Current home glucose readings . fasting glucose: [phone] . post prandial glucose: 150, 160, 159  -Denies hypoglycemic/hyperglycemic symptoms -Current meal patterns:  . Snacks: patient reports she  stopped eating unhealthy snacks . Drinks: drinking 3-15 ounce bottles of water, patient reports drinking sweet tea twice a month -Educated on A1c and blood sugar goals; Exercise goal of 150 minutes per week;  -Encouraged patient to drink at least 64 ounces of water  -Patient would like to continue the Farxiga 5 mg taking 2 tablets by mouth in the morning. -Counseled to check feet daily and get yearly eye exams -Counseled on diet and exercise extensively Recommended to continue current medication   Patient Goals/Self-Care Activities . Patient will:  - take medications as prescribed engage in dietary modifications by increasing the amount of frozen vegetables she is eating.   Follow Up Plan: The patient has been provided with contact information for the care management team and has been advised to call with any health related questions or concerns.       Medication Assistance: None required.  Patient affirms current coverage meets needs.  Patient's preferred pharmacy is:  [id number] [place], [place] [id number]: [phone]: [phone] Uses pill box? Yes Pt endorses 85% compliance  We discussed: Benefits of medication synchronization, packaging and delivery as well as enhanced pharmacist oversight with [employer]. Patient decided to: Continue current medication management strategy  Care Plan and Follow Up Patient Decision:  Patient agrees to Care Plan and Follow-up.  Plan: The patient has been provided with contact information for the care management team and has been advised to call with any health related questions or concerns.   VOrlando Penner PharmD Clinical Pharmacist [facility] [phone]

## 2021-03-14 NOTE — Patient Instructions (Signed)
Visit Information It was great speaking with you today!  Please let me know if you have any questions about our visit.  Goals Addressed            This Visit's Progress   . Manage My Medicine       Timeframe:  Long-Range Goal Priority:  High Start Date:                             Expected End Date:                       Follow Up Date 05/13/2021   - call for medicine refill 2 or 3 days before it runs out - call if I am sick and can't take my medicine - keep a list of all the medicines I take; vitamins and herbals too - use a pillbox to sort medicine    Why is this important?   . These steps will help you keep on track with your medicines.        Patient Care Plan: Social Work [facility] Care Plan  Completed 01/22/2021  Problem Identified: Caregiver Stress Resolved 01/22/2021    Long-Range Goal: Caregiver Coping Optimized Completed 01/22/2021  Start Date: 12/16/2020  Expected End Date: 04/15/2021  Priority: High  Note:   Current Barriers:  . Caregiver burnout . Chronic conditions including DM II, HLD, and HTN  . Limited social support   Social Work Clinical Goal(s):  [name] Kitchen Over the next 120 days, patient will work with SW to address concerns related to caregiver stress  3.9.22- Goal closed due to inability to maintain patient contact  Interventions: . 1:1 collaboration with [name], FNP regarding development and update of comprehensive plan of care as evidenced by provider attestation and co-signature . Inter-disciplinary care team collaboration (see longitudinal plan of care) . In basket message received from Maisie Fus D requesting SW assistance with resources pertaining to social support . Successful outbound call placed to the patient to assist with caregiver resource needs . Determined the patient is having difficulty caring for her husband since he has been discharged from home health . Attempted to explore respite resources - patient requests SW contact her  at a later date as she prefers to speak with the patients PCP before exploring resources . Scheduled follow up call over the next two weeks  Patient Goals/Self-Care Activities Over the next 30 days, patient will:   - Patient will self administer medications as prescribed Patient will attend all scheduled provider appointments Patient will call provider office for new concerns or questions Contact SW as needed prior to next scheduled call  Follow up Plan: SW will follow up with patient by phone over the next two weeks    Patient Care Plan: CCM Pharmacy Care Plan    Problem Identified: DM II , HTN   Priority: High    Long-Range Goal: Disease Management   This Visit's Progress: On track  Priority: High  Note:    Current Barriers:  . Unable to independently monitor therapeutic efficacy  Pharmacist Clinical Goal(s):  [name] Kitchen Patient will achieve adherence to monitoring guidelines and medication adherence to achieve therapeutic efficacy through collaboration with PharmD and provider.   Interventions: . 1:1 collaboration with [name], FNP regarding development and update of comprehensive plan of care as evidenced by provider attestation and co-signature . Inter-disciplinary care team collaboration (see longitudinal plan of care) .  Comprehensive medication review performed; medication list updated in electronic medical record  Hypertension (BP goal <130/80) -Controlled -Current treatment: . Lisinopril 2.5 mg tablet once per day.  -Current home readings: Checking BP 2 times per day. 96/58 pulse:66, 109/60-62, 110/59 - 67,  -Current dietary habits: using Mrs. Dash to season her food -Current exercise habits: she is walking around the house a lot -Denies hypotensive/hypertensive symptoms -Patient reports losing weight -Educated on BP goals and benefits of medications for prevention of heart attack, stroke and kidney damage; Daily salt intake goal < 2300 mg; Importance of home blood  pressure monitoring; Proper BP monitoring technique;  -Counseled to monitor BP at home one time per day, document, and provide log at future appointments -Recommended to continue current medication  Diabetes (A1c goal <7%) -Uncontrolled -Current medications: . Farxiga 5 mg - take 2 tablets daily  . Trulicity 3 mg/0.54ml - inject 3 mg once a week on Tuesday  -Current home glucose readings . fasting glucose: [phone] . post prandial glucose: 150, 160, 159  -Denies hypoglycemic/hyperglycemic symptoms -Current meal patterns:  . Snacks: patient reports she stopped eating unhealthy snacks . Drinks: drinking 3-15 ounce bottles of water, patient reports drinking sweet tea twice a month -Educated on A1c and blood sugar goals; Exercise goal of 150 minutes per week;  -Encouraged patient to drink at least 64 ounces of water  -Patient would like to continue the Farxiga 5 mg taking 2 tablets by mouth in the morning. -Counseled to check feet daily and get yearly eye exams -Counseled on diet and exercise extensively Recommended to continue current medication   Patient Goals/Self-Care Activities . Patient will:  - take medications as prescribed engage in dietary modifications by increasing the amount of frozen vegetables she is eating.   Follow Up Plan: The patient has been provided with contact information for the care management team and has been advised to call with any health related questions or concerns.       Patient agreed to services and verbal consent obtained.   The patient verbalized understanding of instructions, educational materials, and care plan provided today and agreed to receive a mailed copy of patient instructions, educational materials, and care plan.   Cherylin Mylar, PharmD Clinical Pharmacist [facility] [phone]

## 2021-03-20 ENCOUNTER — Telehealth: Payer: Self-pay

## 2021-03-20 NOTE — Chronic Care Management (AMB) (Addendum)
Chronic Care Management Pharmacy Assistant   Name: Catherine [name]  MRN: [id number] DOB: 04/29/1946  Reason for Encounter: Disease State/ Diabetes  Recent office visits:  None  Recent consult visits:  None  Hospital visits:  None in previous 6 months  Medications: Outpatient Encounter Medications as of 03/20/2021  Medication Sig Note   Accu-Chek FastClix Lancets MISC AS DIRECTED TWICE A DAY SUBCUTANEOUS 90 DAYS E11.9    Ascorbic Acid (VITAMIN C) 1000 MG tablet Take 1,000 mg by mouth daily.    aspirin 81 MG tablet Take 81 mg by mouth daily.    atorvastatin (LIPITOR) 10 MG tablet TAKE 1 TABLET BY MOUTH EVERY DAY    beta carotene w/minerals (OCUVITE) tablet Take 1 tablet by mouth daily. 08/17/2018: Uses preservision   calcium citrate-vitamin D (CITRACAL+D) 315-200 MG-UNIT tablet Take 1 tablet by mouth 2 (two) times daily.    Cyanocobalamin (VITAMIN B 12 PO) Take 1,000 mg by mouth daily.    dapagliflozin propanediol (FARXIGA) 5 MG TABS tablet Take 2 tablets (10 mg total) by mouth daily before breakfast.    Dulaglutide (TRULICITY) 3 MG/0.5ML SOPN Inject 3 mg as directed once a week.    ferrous sulfate 325 (65 FE) MG tablet Take 1 tablet (325 mg total) by mouth 2 (two) times daily.    glucose blood (ACCU-CHEK GUIDE) test strip Use to check blood sugars twice daily E11.69    hydrocortisone (ANUSOL-HC) 2.5 % rectal cream Place 1 application rectally 2 (two) times daily.    Insulin Pen Needle (PEN NEEDLES) 32G X 4 MM MISC 1 each by Does not apply route once a week. Use as directed once per week with Ozempic    levothyroxine (SYNTHROID) 112 MCG tablet TAKE 1 TABLET (112 MCG TOTAL) BY MOUTH DAILY BEFORE BREAKFAST.    lisinopril (ZESTRIL) 2.5 MG tablet TAKE 1 TABLET BY MOUTH EVERY DAY    Multiple Vitamin (MULTIVITAMIN WITH MINERALS) TABS tablet Take 1 tablet by mouth daily.    Multiple Vitamins-Minerals (HAIR SKIN AND NAILS FORMULA) TABS Take 1 tablet by mouth daily.    mupirocin ointment  (BACTROBAN) 2 % APPLY TO AFFECTED AREA TWICE A DAY    Omega-3 Fatty Acids (FISH OIL) 1000 MG CAPS Take 1 capsule by mouth daily.     Wheat Dextrin (BENEFIBER DRINK MIX PO) Take by mouth.    No facility-administered encounter medications on file as of 03/20/2021.   Recent Relevant Labs: Lab Results  Component Value Date/Time   HGBA1C 7.2 (H) 01/23/2021 11:54 AM   HGBA1C 7.2 (H) 11/05/2020 12:51 PM   MICROALBUR 10 08/06/2020 01:40 PM   MICROALBUR 10 10/05/2019 12:37 PM    Kidney Function Lab Results  Component Value Date/Time   CREATININE 1.22 (H) 01/23/2021 11:54 AM   CREATININE 1.28 (H) 11/05/2020 12:51 PM   CREATININE 1.79 (H) 12/21/2017 11:26 AM   GFRNONAA 42 (L) 11/05/2020 12:51 PM   GFRNONAA 28 (L) 12/21/2017 11:26 AM   GFRAA 48 (L) 11/05/2020 12:51 PM   GFRAA 32 (L) 12/21/2017 11:26 AM    Current antihyperglycemic regimen:  Trulicity 3mg  weekly on Tuesdays Farxiga 5mg  2 tablets daily  What recent interventions/DTPs have been made to improve glycemic control:  Patient states she is taking medications as directed.  Have there been any recent hospitalizations or ED visits since last visit with CPP? No   Patient denies hypoglycemic symptoms  Patient denies hyperglycemic symptoms  How often are you checking your blood sugar? twice daily  What are your blood sugars ranging?  Fasting: 157, 173 Before meals: None After meals: 140 Bedtime: None  During the week, how often does your blood glucose drop below 70? Patient reports  Never   Are you checking your feet daily/regularly? Patient reports daily.  Adherence Review: Is the patient currently on a STATIN medication? Yes Is the patient currently on ACE/ARB medication? Yes Does the patient have >5 day gap between last estimated fill dates? Yes   Star Rating Drugs: Trulicity 3mg - Patient Assistance Lisinopril 2.5- Last filled [facility]-06-2021 [facility] Farxiga 5mg - Last filled 03-10-2021 [facility] Atorvastatin 10mg - Last  filled 02-17-2021 [facility] Glimepiride 4mg - Last [facility]10-[facility]   [facility]  [phone]  I have reviewed the care management and care coordination activities outlined in this encounter and I am certifying that I agree with the content of this note. No further action required.  , [facility] 04/02/21 4:21 PM

## 2021-03-21 ENCOUNTER — Other Ambulatory Visit: Payer: Self-pay | Admitting: Nurse Practitioner

## 2021-03-21 DIAGNOSIS — E119 Type 2 diabetes mellitus without complications: Secondary | ICD-10-CM

## 2021-03-24 ENCOUNTER — Other Ambulatory Visit: Payer: Self-pay | Admitting: Nurse Practitioner

## 2021-03-24 DIAGNOSIS — E039 Hypothyroidism, unspecified: Secondary | ICD-10-CM

## 2021-03-25 ENCOUNTER — Telehealth: Payer: Self-pay

## 2021-03-25 NOTE — Telephone Encounter (Signed)
-----   Message from [name], FNP sent at 03/24/2021  5:30 PM EDT ----- Regarding: RE: Trulicity She will have the higher dose shipped. Continue taking what she is taking  ----- Message ----- From: Mariam Dollar, CMA Sent: 03/24/2021   9:51 AM EDT To: [name], FNP, Harlan Stains, RPH Subject: Trulicity                                      The patient said that she looked at her print out of meds, she see that she has 3mg  of trulicity but that she has only ever been taking 1.5 mg.  The patient wants to check to see if she is supposed to double up on what she has and if she is getting a shipment of 3 mg trulicity. The pt said her scheduled dose day is tomorrow.

## 2021-03-25 NOTE — Telephone Encounter (Signed)
I left the pt a message to call the office back. 

## 2021-04-29 ENCOUNTER — Encounter: Payer: Self-pay | Admitting: Nurse Practitioner

## 2021-04-29 ENCOUNTER — Ambulatory Visit (INDEPENDENT_AMBULATORY_CARE_PROVIDER_SITE_OTHER): Payer: Medicare Other | Admitting: Nurse Practitioner

## 2021-04-29 ENCOUNTER — Other Ambulatory Visit: Payer: Self-pay | Admitting: Nurse Practitioner

## 2021-04-29 ENCOUNTER — Other Ambulatory Visit: Payer: Self-pay

## 2021-04-29 VITALS — BP 114/70 | HR 79 | Temp 98.9°F | Ht 64.6 in | Wt 159.6 lb

## 2021-04-29 DIAGNOSIS — E782 Mixed hyperlipidemia: Secondary | ICD-10-CM

## 2021-04-29 DIAGNOSIS — E039 Hypothyroidism, unspecified: Secondary | ICD-10-CM

## 2021-04-29 DIAGNOSIS — E1122 Type 2 diabetes mellitus with diabetic chronic kidney disease: Secondary | ICD-10-CM

## 2021-04-29 DIAGNOSIS — I10 Essential (primary) hypertension: Secondary | ICD-10-CM | POA: Diagnosis not present

## 2021-04-29 DIAGNOSIS — N1831 Chronic kidney disease, stage 3a: Secondary | ICD-10-CM | POA: Diagnosis not present

## 2021-04-29 MED ORDER — DAPAGLIFLOZIN PROPANEDIOL 10 MG PO TABS: 10.0000 mg | ORAL_TABLET | Freq: Every day | ORAL | 5 refills | Status: DC

## 2021-04-29 MED ORDER — HYDROCORTISONE (PERIANAL) 2.5 % EX CREA: 1.0000 "application " | TOPICAL_CREAM | Freq: Two times a day (BID) | CUTANEOUS | 5 refills | Status: DC

## 2021-04-29 NOTE — Patient Instructions (Signed)

## 2021-04-29 NOTE — Progress Notes (Signed)
I,Yamilka Roman [employer] as a Education administrator for [employer], FNP.,have documented all relevant documentation on the behalf of Catherine Brine, FNP,as directed by  Catherine Brine, FNP while in the presence of Catherine [name], [place].   This visit occurred during the SARS-CoV-2 public health emergency.  Safety protocols were in place, including screening questions prior to the visit, additional usage of staff PPE, and extensive cleaning of exam room while observing appropriate contact time as indicated for disinfecting solutions.  Subjective:     Patient ID: Catherine [name] , female    DOB: 1946-07-23 , 75 y.o.   MRN: [id number]   Chief Complaint  Patient presents with   Diabetes   Hypertension    HPI  Patient is here for bp dm check.  She has been taking 1.5 mg Trulicity rather than 3 mg. Blood pressure is ranging 107-111/49-67  Wt Readings from Last 3 Encounters: 04/29/21 : 159 lb 9.6 oz (72.4 kg) 01/23/21 : 169 lb 5 oz (76.8 kg) 01/23/21 : 169 lb 6.4 oz (76.8 kg)    Diabetes She presents for her follow-up diabetic visit. She has type 2 diabetes mellitus. Her disease course has been stable. There are no hypoglycemic associated symptoms. Pertinent negatives for hypoglycemia include no confusion, dizziness, headaches or nervousness/anxiousness. Pertinent negatives for diabetes include no blurred vision, no chest pain, no fatigue, no polydipsia, no polyphagia and no polyuria. There are no hypoglycemic complications. Symptoms are stable. There are no diabetic complications. Pertinent negatives for diabetic complications include no CVA. Risk factors for coronary artery disease include diabetes mellitus and sedentary lifestyle. Current diabetic treatment includes oral agent (dual therapy). She is compliant with treatment all of the time. Her weight is decreasing steadily. She is following a diabetic diet. When asked about meal planning, she reported none. She has not had a previous visit with a dietitian.  She rarely participates in exercise. (Blood sugar readings range  130-194. She is no longer taking the glyburide) An ACE inhibitor/angiotensin II receptor blocker is being taken. She does not see a podiatrist.Eye exam is current.  Hypertension This is a chronic problem. The current episode started more than 1 year ago. The problem is unchanged. The problem is controlled. Pertinent negatives include no anxiety, blurred vision, chest pain, headaches or palpitations. There are no associated agents to hypertension. Risk factors for coronary artery disease include obesity and sedentary lifestyle. Past treatments include ACE inhibitors. The current treatment provides significant improvement. There are no compliance problems.  There is no history of CVA. There is no history of chronic renal disease.    Past Medical History:  Diagnosis Date   Diabetes mellitus without complication ([place])    Hypertension    Thyroid disease      Family History  Problem Relation Age of Onset   Diabetes Mellitus II Mother    Stroke Mother    Diabetes Mellitus II Sister    Hypertension Sister      Current Outpatient Medications:    Accu-Chek FastClix Lancets MISC, AS DIRECTED TWICE A DAY SUBCUTANEOUS 90 DAYS E11.9, Disp: 204 each, Rfl: 4   Ascorbic Acid (VITAMIN C) 1000 MG tablet, Take 1,000 mg by mouth daily., Disp: , Rfl:    aspirin 81 MG tablet, Take 81 mg by mouth daily., Disp: , Rfl:    atorvastatin (LIPITOR) 10 MG tablet, TAKE 1 TABLET BY MOUTH EVERY DAY, Disp: 90 tablet, Rfl: 0   beta carotene w/minerals (OCUVITE) tablet, Take 1 tablet by mouth daily., Disp: , Rfl:  calcium citrate-vitamin D (CITRACAL+D) 315-200 MG-UNIT tablet, Take 1 tablet by mouth 2 (two) times daily., Disp: , Rfl:    Cyanocobalamin (VITAMIN B 12 PO), Take 1,000 mg by mouth daily., Disp: , Rfl:    Dulaglutide (TRULICITY) 3 FV/4.9SW SOPN, Inject 3 mg as directed once a week., Disp: 12 mL, Rfl: 1   ferrous sulfate 325 (65 FE) MG tablet, Take  1 tablet (325 mg total) by mouth 2 (two) times daily., Disp: 180 tablet, Rfl: 1   glucose blood (ACCU-CHEK GUIDE) test strip, Use to check blood sugars twice daily E11.69, Disp: 100 each, Rfl: 2   levothyroxine (SYNTHROID) 112 MCG tablet, TAKE 1 TABLET BY MOUTH DAILY BEFORE BREAKFAST., Disp: 90 tablet, Rfl: 0   lisinopril (ZESTRIL) 2.5 MG tablet, TAKE 1 TABLET BY MOUTH EVERY DAY, Disp: 90 tablet, Rfl: 1   Multiple Vitamin (MULTIVITAMIN WITH MINERALS) TABS tablet, Take 1 tablet by mouth daily., Disp: , Rfl:    mupirocin ointment (BACTROBAN) 2 %, APPLY TO AFFECTED AREA TWICE A DAY, Disp: 22 g, Rfl: 0   Omega-3 Fatty Acids (FISH OIL) 1000 MG CAPS, Take 1 capsule by mouth daily. , Disp: , Rfl:    Wheat Dextrin (BENEFIBER DRINK MIX PO), Take by mouth., Disp: , Rfl:    dapagliflozin propanediol (FARXIGA) 10 MG TABS tablet, Take 1 tablet (10 mg total) by mouth daily., Disp: 30 tablet, Rfl: 5   hydrocortisone (ANUSOL-HC) 2.5 % rectal cream, Place 1 application rectally 2 (two) times daily., Disp: 30 g, Rfl: 5   No Known Allergies   Review of Systems  Constitutional:  Negative for fatigue.  Eyes:  Negative for blurred vision.  Respiratory: Negative.  Negative for cough and wheezing.   Cardiovascular:  Negative for chest pain, palpitations and leg swelling.  Endocrine: Negative for polydipsia, polyphagia and polyuria.  Neurological:  Negative for dizziness and headaches.  Psychiatric/Behavioral:  Negative for confusion. The patient is not nervous/anxious.     Today's Vitals   04/29/21 1131  BP: 114/70  Pulse: 79  Temp: 98.9 F (37.2 C)  Weight: 159 lb 9.6 oz (72.4 kg)  Height: 5' 4.6" (1.641 m)  PainSc: 0-No pain   Body mass index is 26.89 kg/m.   Objective:  Physical Exam Vitals reviewed.  Constitutional:      General: She is not in acute distress.    Appearance: Normal appearance. She is normal weight.  Cardiovascular:     Rate and Rhythm: Normal rate and regular rhythm.      Pulses: Normal pulses.     Heart sounds: Normal heart sounds. No murmur heard. Pulmonary:     Effort: Pulmonary effort is normal. No respiratory distress.     Breath sounds: Normal breath sounds. No wheezing.  Skin:    General: Skin is warm and dry.     Capillary Refill: Capillary refill takes less than 2 seconds.  Neurological:     General: No focal deficit present.     Mental Status: She is alert and oriented to person, place, and time.     Cranial Nerves: No cranial nerve deficit.  Psychiatric:        Mood and Affect: Mood normal.        Behavior: Behavior normal.        Thought Content: Thought content normal.        Judgment: Judgment normal.        Assessment And Plan:     1. Type 2 diabetes mellitus without complication, without  long-term current use of insulin (HCC) Chronic, will change her Farxiga from 78m to 176msince she is taking 2 tabs of the 5 mg.  Will check her HgbA1c - CMP14+EGFR - Hemoglobin A1c - dapagliflozin propanediol (FARXIGA) 10 MG TABS tablet; Take 1 tablet (10 mg total) by mouth daily.  Dispense: 30 tablet; Refill: 5  2. Essential hypertension Chronic, well controlled  Continue with current medications  3. Mixed hyperlipidemia Chronic, controlled Continue with current medications, tolerating well - Lipid panel  4. Acquired hypothyroidism Chronic, stable Continue with current medications - T3, free - T4 - TSH    Patient was given opportunity to ask questions. Patient verbalized understanding of the plan and was able to repeat key elements of the plan. All questions were answered to their satisfaction.  JaMinette BrineFNP   I, JaMinette BrineFNP, have reviewed all documentation for this visit. The documentation on 04/29/21 for the exam, diagnosis, procedures, and orders are all accurate and complete.   IF YOU HAVE BEEN REFERRED TO A SPECIALIST, IT MAY TAKE 1-2 WEEKS TO SCHEDULE/PROCESS THE REFERRAL. IF YOU HAVE NOT HEARD FROM US/SPECIALIST IN  TWO WEEKS, PLEASE GIVE [place] CALL AT [phone] X [phone].   THE PATIENT IS ENCOURAGED TO PRACTICE SOCIAL DISTANCING DUE TO THE COVID-19 PANDEMIC.

## 2021-04-30 LAB — HEMOGLOBIN A1C
Est. average glucose Bld gHb Est-mCnc: 186 mg/dL
Hgb A1c MFr Bld: 8.1 % — ABNORMAL HIGH (ref 4.8–5.6)

## 2021-04-30 LAB — LIPID PANEL
Chol/HDL Ratio: 3 ratio (ref 0.0–4.4)
Cholesterol, Total: 145 mg/dL (ref 100–199)
HDL: 48 mg/dL (ref >39)
LDL Chol Calc (NIH): 77 mg/dL (ref 0–99)
Triglycerides: 113 mg/dL (ref 0–149)
VLDL Cholesterol Cal: 20 mg/dL (ref 5–40)

## 2021-04-30 LAB — CMP14+EGFR
ALT: 23 IU/L (ref 0–32)
AST: 23 IU/L (ref 0–40)
Albumin/Globulin Ratio: 1.7 (ref 1.2–2.2)
Albumin: 4.5 g/dL (ref 3.7–4.7)
Alkaline Phosphatase: 93 IU/L (ref 44–121)
BUN/Creatinine Ratio: 14 (ref 12–28)
BUN: 20 mg/dL (ref 8–27)
Bilirubin Total: 0.6 mg/dL (ref 0.0–1.2)
CO2: 22 mmol/L (ref 20–29)
Calcium: 9.6 mg/dL (ref 8.7–10.3)
Chloride: 99 mmol/L (ref 96–106)
Creatinine, Ser: 1.42 mg/dL — ABNORMAL HIGH (ref 0.57–1.00)
Globulin, Total: 2.6 g/dL (ref 1.5–4.5)
Glucose: 230 mg/dL — ABNORMAL HIGH (ref 65–99)
Potassium: 4.2 mmol/L (ref 3.5–5.2)
Sodium: 138 mmol/L (ref 134–144)
Total Protein: 7.1 g/dL (ref 6.0–8.5)
eGFR: 39 mL/min/{1.73_m2} — ABNORMAL LOW (ref >59)

## 2021-04-30 LAB — T4: T4, Total: 9.8 ug/dL (ref 4.5–12.0)

## 2021-04-30 LAB — T3, FREE: T3, Free: 1.7 pg/mL — ABNORMAL LOW (ref 2.0–4.4)

## 2021-04-30 LAB — TSH: TSH: 0.694 u[IU]/mL (ref 0.450–4.500)

## 2021-05-12 ENCOUNTER — Telehealth: Payer: Self-pay

## 2021-05-12 NOTE — Chronic Care Management (AMB) (Signed)
   No answer, left message of telephone appointment with Cherylin Mylar CPP on 05-13-2021 at 12:30. Left message to have all medications, supplements, blood pressure and/or blood sugar logs available during appointment and to return call if need to reschedule.  [name] [facility], [facility], [facility] Clinical Pharmacist Assistant [phone]

## 2021-05-13 ENCOUNTER — Telehealth: Payer: Federal, State, Local not specified - PPO

## 2021-05-13 NOTE — Progress Notes (Deleted)
Current Barriers:  {pharmacybarriers:24917}  Pharmacist Clinical Goal(s):  Patient will {PHARMACYGOALCHOICES:24921} through collaboration with PharmD and provider.   Interventions: 1:1 collaboration with [name], FNP regarding development and update of comprehensive plan of care as evidenced by provider attestation and co-signature Inter-disciplinary care team collaboration (see longitudinal plan of care) Comprehensive medication review performed; medication list updated in electronic medical record  {CCM [facility] DISEASE STATES:25130}  Patient Goals/Self-Care Activities Patient will:  - {pharmacypatientgoals:24919}  Follow Up Plan: {CM FOLLOW UP PTWS:56812}   Chronic Care Management Pharmacy Note  05/13/2021 Name:  Catherine [name] MRN:  [id number] DOB:  1946-07-09  Summary: ***  Recommendations/Changes made from today's visit: ***  Plan: ***   Subjective: Catherine [name] is an 75 y.o. year old female who is a primary patient of [name], [place]net[place].  The CCM team was consulted for assistance with disease management and care coordination needs.    {CCMTELEPHONEFACETOFACE:21091510} for {CCMINITIALFOLLOWUPCHOICE:21091511} in response to provider referral for pharmacy case management and/or care coordination services.   Consent to Services:  {CCMCONSENTOPTIONS:25074}  Patient Care Team: [name], FNP as PCP - General (General Practice) Brunetta Genera, MD as Consulting Physician (Hematology) Mayford Knife, [facility] (Pharmacist)  Recent office visits: ***  Recent consult visits: [facility] visits: {Hospital DC Yes/No:25215}   Objective:  Lab Results  Component Value Date   CREATININE 1.42 (H) 04/29/2021   BUN 20 04/29/2021   GFRNONAA 42 (L) 11/05/2020   GFRAA 48 (L) 11/05/2020   NA 138 04/29/2021   K 4.2 04/29/2021   CALCIUM 9.6 04/29/2021   CO2 22 04/29/2021   GLUCOSE 230 (H) 04/29/2021    Lab Results  Component Value Date/Time    HGBA1C 8.1 (H) 04/29/2021 12:26 PM   HGBA1C 7.2 (H) 01/23/2021 11:54 AM   MICROALBUR 10 08/06/2020 01:40 PM   MICROALBUR 10 10/05/2019 12:37 PM    Last diabetic Eye exam:  Lab Results  Component Value Date/Time   HMDIABEYEEXA No Retinopathy 11/13/2020 12:00 AM    Last diabetic Foot exam: No results found for: HMDIABFOOTEX   Lab Results  Component Value Date   CHOL 145 04/29/2021   HDL 48 04/29/2021   LDLCALC 77 04/29/2021   TRIG 113 04/29/2021   CHOLHDL 3.0 04/29/2021    Hepatic Function Latest Ref Rng & Units 04/29/2021 01/23/2021 08/06/2020  Total Protein 6.0 - 8.5 g/dL 7.1 6.8 6.9  Albumin 3.7 - 4.7 g/dL 4.5 4.6 4.8(H)  AST 0 - 40 IU/L '23 24 21  ' ALT 0 - 32 IU/L 23 30 35(H)  Alk Phosphatase 44 - 121 IU/L 93 89 88  Total Bilirubin 0.0 - 1.2 mg/dL 0.6 0.6 0.5    Lab Results  Component Value Date/Time   TSH 0.694 04/29/2021 12:26 PM   TSH 1.500 11/05/2020 12:51 PM   FREET4 1.42 10/05/2019 08:55 AM   FREET4 1.50 05/10/2019 10:37 AM    CBC Latest Ref Rng & Units 01/23/2021 08/06/2020 11/17/2018  WBC 3.4 - 10.8 x10E3/uL 3.6 4.4 4.0  Hemoglobin 11.1 - 15.9 g/dL 11.0(L) 11.4 10.4(L)  Hematocrit 34.0 - 46.6 % 32.3(L) 33.7(L) 29.8(L)  Platelets 150 - 450 x10E3/uL 211 215 188    Lab Results  Component Value Date/Time   VD25OH 34.5 01/23/2021 11:54 AM   VD25OH 33.6 11/05/2020 12:51 PM    Clinical ASCVD: {YES/NO:21197} The 10-year ASCVD risk score Mikey Bussing DC Jr., et al., 2013) is: 18.9%   Values used to calculate the score:     Age: 75  years     Sex: Female     Is Non-Hispanic African American: Yes     Diabetic: Yes     Tobacco smoker: No     Systolic Blood Pressure: 150 mmHg     Is BP treated: Yes     HDL Cholesterol: 48 mg/dL     Total Cholesterol: 145 mg/dL    Depression screen [facility] 2/9 01/23/2021 12/10/2020 01/18/2020  Decreased Interest 0 1 0  Down, Depressed, Hopeless 0 1 0  PHQ - 2 Score 0 2 0  Altered sleeping - 1 0  Tired, decreased energy - 1 0  Change in  appetite - 1 0  Feeling bad or failure about yourself  - 1 0  Trouble concentrating - 1 0  Moving slowly or fidgety/restless - 0 0  Suicidal thoughts - 0 0  PHQ-9 Score - 7 0  Difficult doing work/chores - - Not difficult at all     ***Other: (CHADS2VASc if Afib, MMRC or CAT for COPD, ACT, DEXA)  Social History   Tobacco Use  Smoking Status Never  Smokeless Tobacco Never   BP Readings from Last 3 Encounters:  04/29/21 114/70  01/23/21 138/60  01/23/21 138/60   Pulse Readings from Last 3 Encounters:  04/29/21 79  01/23/21 85  01/23/21 85   Wt Readings from Last 3 Encounters:  04/29/21 159 lb 9.6 oz (72.4 kg)  01/23/21 169 lb 5 oz (76.8 kg)  01/23/21 169 lb 6.4 oz (76.8 kg)   BMI Readings from Last 3 Encounters:  04/29/21 26.89 kg/m  01/23/21 29.80 kg/m  01/23/21 29.82 kg/m    Assessment/Interventions: Review of patient past medical history, allergies, medications, health status, including review of consultants reports, laboratory and other test data, was performed as part of comprehensive evaluation and provision of chronic care management services.   SDOH:  (Social Determinants of Health) assessments and interventions performed: {yes/no:20286}  SDOH Screenings   Alcohol Screen: Not on file  Depression (PHQ2-9): Low Risk    PHQ-2 Score: 0  Financial Resource Strain: Low Risk    Difficulty of Paying Living Expenses: Not hard at all  Food Insecurity: No Food Insecurity   Worried About Charity fundraiser in the Last Year: Never true   Ran Out of Food in the Last Year: Never true  Housing: Not on file  Physical Activity: Inactive   Days of Exercise per Week: 0 days   Minutes of Exercise per Session: 0 min  Social Connections: Not on file  Stress: No Stress Concern Present   Feeling of Stress : Not at all  Tobacco Use: Low Risk    Smoking Tobacco Use: Never   Smokeless Tobacco Use: Never  Transportation Needs: No Transportation Needs   Lack of  Transportation (Medical): No   Lack of Transportation (Non-Medical): No    CCM Care Plan  No Known Allergies  Medications Reviewed Today     Reviewed by [name], FNP (Family Nurse Practitioner) on 04/29/21 at 1202  Med List Status: <None>   Medication Order Taking? Sig Documenting Provider Last Dose Status Informant  Accu-Chek FastClix Lancets MISC [id number] Yes AS DIRECTED TWICE A DAY SUBCUTANEOUS 90 DAYS E11.9 [name], FNP Taking Active   Ascorbic Acid (VITAMIN C) 1000 MG tablet [id number] Yes Take 1,000 mg by mouth daily. [provider] Taking Active   aspirin 81 MG tablet [id number] Yes Take 81 mg by mouth daily. [provider] Taking Active Multiple Informants  atorvastatin (LIPITOR)  10 MG tablet [id number] Yes TAKE 1 TABLET BY MOUTH EVERY DAY [name], FNP Taking Active   beta carotene w/minerals (OCUVITE) tablet [id number] Yes Take 1 tablet by mouth daily. [provider] Taking Active Multiple Informants           Med Note Kellie Simmering   Wed Aug 17, 2018  9:06 AM) Uses preservision  calcium citrate-vitamin D (CITRACAL+D) 315-200 MG-UNIT tablet [id number] Yes Take 1 tablet by mouth 2 (two) times daily. [provider] Taking Active   Cyanocobalamin (VITAMIN B 12 PO) [id number] Yes Take 1,000 mg by mouth daily. [provider] Taking Active Multiple Informants  Dulaglutide (TRULICITY) 3 FB/5.1WC SOPN [id number] Yes Inject 3 mg as directed once a week. [name], FNP Taking Active   FARXIGA 5 MG TABS tablet [id number] Yes TAKE 2 TABLETS (10 MG TOTAL) BY MOUTH DAILY BEFORE BREAKFAST. [name], FNP Taking Active   ferrous sulfate 325 (65 FE) MG tablet [id number] Yes Take 1 tablet (325 mg total) by mouth 2 (two) times daily. [name], FNP Taking Active   glucose blood (ACCU-CHEK GUIDE) test strip [id number] Yes Use to check blood sugars twice daily E11.69 [name], FNP Taking Active   hydrocortisone (ANUSOL-HC)  2.5 % rectal cream [id number] Yes Place 1 application rectally 2 (two) times daily. [name], FNP Taking Active   Insulin Pen Needle (PEN NEEDLES) 32G X 4 MM MISC [id number] Yes 1 each by Does not apply route once a week. Use as directed once per week with Weyman Croon, Doreene Burke, FNP Taking Active   levothyroxine (SYNTHROID) 112 MCG tablet [id number] Yes TAKE 1 TABLET BY MOUTH DAILY BEFORE BREAKFAST. [name], FNP Taking Active   lisinopril (ZESTRIL) 2.5 MG tablet [id number] Yes TAKE 1 TABLET BY MOUTH EVERY DAY [name], FNP Taking Active   Multiple Vitamin (MULTIVITAMIN WITH MINERALS) TABS tablet [id number] Yes Take 1 tablet by mouth daily. [provider] Taking Active   Multiple Vitamins-Minerals (HAIR SKIN AND NAILS FORMULA) TABS [id number] Yes Take 1 tablet by mouth daily. [provider] Taking Active Self  mupirocin ointment (BACTROBAN) 2 % [id number] Yes APPLY TO AFFECTED AREA TWICE A DAY [name], FNP Taking Active   Omega-3 Fatty Acids (FISH OIL) 1000 MG CAPS [id number] Yes Take 1 capsule by mouth daily.  [provider] Taking Active Self  Wheat Dextrin (BENEFIBER DRINK MIX PO) [id number] Yes Take by mouth. [provider] Taking Active             Patient Active Problem List   Diagnosis Date Noted   Type 2 diabetes mellitus without complication, without long-term current use of insulin ([place]) 02/06/2019   Essential hypertension 02/06/2019   Hordeolum externum of left upper eyelid 12/14/2018   Benign hypertension with chronic kidney disease, stage III ([place]) 08/03/2018   Diabetes type 2, controlled 08/23/2014   Anemia 08/23/2014   Hypokalemia 08/23/2014   CAP (community acquired pneumonia)--treated 08/23/2014   Physical debility 08/16/2014   Pneumonia 08/13/2014   DKA, type 2 ([place]) 08/13/2014   Acute renal failure ([place]) 08/13/2014   Hypernatremia 08/13/2014   Elevated LFTs 08/13/2014   Acquired hypothyroidism 08/13/2014   DKA  (diabetic ketoacidoses) 08/13/2014    Immunization History  Administered Date(s) Administered   Fluad Quad(high Dose 65+) 08/11/2019, 09/13/2020   Influenza, High Dose Seasonal PF 08/08/2018   Influenza,inj,Quad PF,6+ Mos 08/16/2014   Influenza-Unspecified 08/08/2018, 08/11/2019   PFIZER(Purple Top)SARS-COV-2 Vaccination 12/21/2019, 01/11/2020, 10/23/2020   Pneumococcal Conjugate-13 10/02/2020  Pneumococcal Polysaccharide-23 08/24/2019   Tdap 09/18/2019    Conditions to be addressed/monitored:  {USCCMDZASSESSMENTOPTIONS:23563}  There are no care plans that you recently modified to display for this patient.    Medication Assistance: {MEDASSISTANCEINFO:25044}  Compliance/Adherence/Medication fill history: Care Gaps: ***  Star-Rating Drugs: ***  Patient's preferred pharmacy is:  [id number] [place], [place] - [id number] [id number]: [phone]: [phone] Uses pill box? {Yes or If no, why not?:20788} Pt endorses ***% compliance  We discussed: {Pharmacy options:24294} Patient decided to: {US Pharmacy Plan:23885}  Care Plan and Follow Up Patient Decision:  {FOLLOWUP:24991}  Plan: {CM FOLLOW UP PLAN:25073}  ***

## 2021-05-16 ENCOUNTER — Other Ambulatory Visit: Payer: Self-pay | Admitting: Nurse Practitioner

## 2021-05-21 ENCOUNTER — Telehealth: Payer: Self-pay

## 2021-05-21 NOTE — Chronic Care Management (AMB) (Signed)
Chronic Care Management Pharmacy Assistant   Name: Catherine [name]  MRN: [id number] DOB: 13-Dec-1945   Reason for Encounter: Disease State/Diabetes/ Medication Adherence   Recent office visits:  04-29-21 [name] FNP (PCP) Changed Catherine [name] from 5mg  to 10mg  since she was taking 2 tabs of the 5 mg.   Recent consult visits: None  Hospital visits:  None in previous 6 months  Medications: Outpatient Encounter Medications as of 05/21/2021  Medication Sig Note   Accu-Chek FastClix Lancets MISC AS DIRECTED TWICE A DAY SUBCUTANEOUS 90 DAYS E11.9    Ascorbic Acid (VITAMIN C) 1000 MG tablet Take 1,000 mg by mouth daily.    aspirin 81 MG tablet Take 81 mg by mouth daily.    atorvastatin (LIPITOR) 10 MG tablet TAKE 1 TABLET BY MOUTH EVERY DAY    beta carotene w/minerals (OCUVITE) tablet Take 1 tablet by mouth daily. 08/17/2018: Uses preservision   calcium citrate-vitamin D (CITRACAL+D) 315-200 MG-UNIT tablet Take 1 tablet by mouth 2 (two) times daily.    Cyanocobalamin (VITAMIN B 12 PO) Take 1,000 mg by mouth daily.    dapagliflozin propanediol (FARXIGA) 10 MG TABS tablet Take 1 tablet (10 mg total) by mouth daily.    Dulaglutide (TRULICITY) 3 MG/0.5ML SOPN Inject 3 mg as directed once a week.    ferrous sulfate 325 (65 FE) MG tablet Take 1 tablet (325 mg total) by mouth 2 (two) times daily.    glucose blood (ACCU-CHEK GUIDE) test strip Use to check blood sugars twice daily E11.69    hydrocortisone (ANUSOL-HC) 2.5 % rectal cream Place 1 application rectally 2 (two) times daily.    levothyroxine (SYNTHROID) 112 MCG tablet TAKE 1 TABLET BY MOUTH DAILY BEFORE BREAKFAST.    lisinopril (ZESTRIL) 2.5 MG tablet TAKE 1 TABLET BY MOUTH EVERY DAY    Multiple Vitamin (MULTIVITAMIN WITH MINERALS) TABS tablet Take 1 tablet by mouth daily.    mupirocin ointment (BACTROBAN) 2 % APPLY TO AFFECTED AREA TWICE A DAY    Omega-3 Fatty Acids (FISH OIL) 1000 MG CAPS Take 1 capsule by mouth daily.     Wheat  Dextrin (BENEFIBER DRINK MIX PO) Take by mouth.    No facility-administered encounter medications on file as of 05/21/2021.   Recent Relevant Labs: Lab Results  Component Value Date/Time   HGBA1C 8.1 (H) 04/29/2021 12:26 PM   HGBA1C 7.2 (H) 01/23/2021 11:54 AM   MICROALBUR 10 08/06/2020 01:40 PM   MICROALBUR 10 10/05/2019 12:37 PM    Kidney Function Lab Results  Component Value Date/Time   CREATININE 1.42 (H) 04/29/2021 12:26 PM   CREATININE 1.22 (H) 01/23/2021 11:54 AM   CREATININE 1.79 (H) 12/21/2017 11:26 AM   GFRNONAA 42 (L) 11/05/2020 12:51 PM   GFRNONAA 28 (L) 12/21/2017 11:26 AM   GFRAA 48 (L) 11/05/2020 12:51 PM   GFRAA 32 (L) 12/21/2017 11:26 AM    Current antihyperglycemic regimen:  Farxiga 10mg  - 1 tab once daily by mouth Trulicity 3mg /0.74ml - Inject 29ml once weekly What recent interventions/DTPs have been made to improve glycemic control:  Changed Farxiga from 5mg  to 10mg  since she was taking 2 tabs of the 5 mg.  Patient stated she has been injecting 1.39ml of Trulicity instead of 43ml once weekly. She states she has not received an updated dose in packaging so she is still taking the 1.59ml injection once a week. Patient has increased her water intake per PCP due to taking her 1m.  Have there been any recent hospitalizations  or ED visits since last visit with CPP? No Patient denies hypoglycemic symptoms, including None Patient denies hyperglycemic symptoms, including none How often are you checking your blood sugar? twice daily What are your blood sugars ranging?  Fasting: 187 Before meals: N/A After meals: N/A Bedtime: 150 During the week, how often does your blood glucose drop below 70? Never Are you checking your feet daily/regularly?  Every day  Adherence Review: Is the patient currently on a STATIN medication? Yes Is the patient currently on ACE/ARB medication? Yes Does the patient have >5 day gap between last estimated fill dates? Yes- Lisinopril 2.5  mg Last fill date was 01-19-21 but was never picked up due to overabundance of medication from previous fills. [facility] and has picked up a 90 day supply of Lisinopril before running out of current supply. She is still taking her lisinopril daily but from 10-07-20 fill and has 25 DS left.     Care Gaps: RAF: 0.637% Due For Covid Vaccine 01-23-21 Annual Wellness Visit completed next AWV is 02-12-22.  Star Rating Drugs: Trulicity 3mg - Patient Assistance Lisinopril 2.5- Last filled 10-07-20 25 [facility] Farxiga 10mg - Last filled 04-29-21 30 [facility] Atorvastatin 10mg - Last filled [facility]01--2022 [facility]  Note: Trulicity dosage needs to be updated in system. Patient is only taking 1.65ml once a week and confirmed with PCP. Sent message to CMA for need of Lisinopril 2.5mg  to pharmacy with refills but does not need refill until 06-12-21.   Next CPP appointment 06-25-21 at 11:30am with 4m. Message sent to [facility] scheduler to place appointment.     08-16-1991, CMA Clinical Pharmacist Assistant [phone]

## 2021-06-03 ENCOUNTER — Telehealth: Payer: Self-pay

## 2021-06-03 NOTE — Chronic Care Management (AMB) (Signed)
    Chronic Care Management Pharmacy Assistant   Name: SAKIA SCHRIMPF  MRN: [id number] DOB: 05/03/1946  Reason for Encounter: Patient Assistance Coordination  06/03/2021-Called patient to verify the dosage of Trulicity she is using for application from [employer] [facility]. No answer, left message to return call.

## 2021-06-07 ENCOUNTER — Other Ambulatory Visit: Payer: Self-pay | Admitting: Nurse Practitioner

## 2021-06-07 DIAGNOSIS — L0292 Furuncle, unspecified: Secondary | ICD-10-CM

## 2021-06-11 ENCOUNTER — Other Ambulatory Visit: Payer: Self-pay

## 2021-06-11 DIAGNOSIS — I10 Essential (primary) hypertension: Secondary | ICD-10-CM

## 2021-06-11 MED ORDER — LISINOPRIL 2.5 MG PO TABS: 2.5000 mg | ORAL_TABLET | Freq: Every day | ORAL | 1 refills | Status: DC

## 2021-06-19 ENCOUNTER — Other Ambulatory Visit: Payer: Self-pay | Admitting: Nurse Practitioner

## 2021-06-19 DIAGNOSIS — E039 Hypothyroidism, unspecified: Secondary | ICD-10-CM

## 2021-06-24 ENCOUNTER — Telehealth: Payer: Self-pay

## 2021-06-24 NOTE — Chronic Care Management (AMB) (Addendum)
   Patient aware of telephone appointment with Cherylin Mylar CPP on 06-25-2021 at 11:30. Patient aware to have/bring all medications, supplements, blood pressure and/or blood sugar logs to visit.  Questions: Have you had any recent office visit or specialist visit outside of [facility]? Patient stated no  Are there any concerns you would like to discuss during your office visit? Patient stated her blood sugars have been elevated (223). Patient stated she feels normal with no symptoms. Told patient if she starts to feel bad to go to the ER. Sent message to Cherylin Mylar and [name].  Are you having any problems obtaining your medications? (Whether it pharmacy issues or cost) Patient stated no.  If patient has any PAP medications ask if they are having any problems getting their PAP medication or refill? No  Care Gaps: RAF: 0.637% Due For Covid Vaccine 01-23-21 Annual Wellness Visit completed next AWV is 02-12-22.  Star Rating Drug: Trulicity 3mg - Patient Assistance (Patient reported taking 1.5 mg) Lisinopril 2.5- Last filled 06-11-2021 [facility] Farxiga 10mg - Last filled 05-29-21 [facility] Atorvastatin 10mg - Last filled 05-16-2021 [facility]  Any gaps in medications fill history? No   05-31-21 [facility] Clinical Pharmacist Assistant [phone]

## 2021-06-25 ENCOUNTER — Ambulatory Visit (INDEPENDENT_AMBULATORY_CARE_PROVIDER_SITE_OTHER): Payer: Medicare Other

## 2021-06-25 DIAGNOSIS — E782 Mixed hyperlipidemia: Secondary | ICD-10-CM | POA: Diagnosis not present

## 2021-06-25 DIAGNOSIS — E1122 Type 2 diabetes mellitus with diabetic chronic kidney disease: Secondary | ICD-10-CM

## 2021-06-25 DIAGNOSIS — N1831 Chronic kidney disease, stage 3a: Secondary | ICD-10-CM

## 2021-06-25 NOTE — Progress Notes (Signed)
Chronic Care Management Pharmacy Note  07/02/2021 Name:  Catherine [name] MRN:  [id number] DOB:  02/20/1946  Summary: Patient reports that she has been doing well   Recommendations/Changes made from today's visit: Recommend patient get COVID-19 vaccine second booster.   Plan: Patient is going to sign up to receive the COVID-19 booster shot.    Subjective: Catherine [name] is an 75 y.o. year old female who is a primary patient of [name], [place].  The CCM team was consulted for assistance with disease management and care coordination needs.    Engaged with patient by telephone for follow up visit in response to provider referral for pharmacy case management and/or care coordination services. Patient reports that she walks around the house and the yard for exercise.   Consent to Services:  The patient was given information about Chronic Care Management services, agreed to services, and gave verbal consent prior to initiation of services.  Please see initial visit note for detailed documentation.   Patient Care Team: [name], FNP as PCP - General (General Practice) Brunetta Genera, MD as Consulting Physician (Hematology) Mayford Knife, [facility] (Pharmacist)  Recent office visits: 04/29/2021 PCP OV  03/13/2021 PCP OV  Recent consult visits: None noted   Hospital visits: None in previous 6 months   Objective:  Lab Results  Component Value Date   CREATININE 1.42 (H) 04/29/2021   BUN 20 04/29/2021   GFRNONAA 42 (L) 11/05/2020   GFRAA 48 (L) 11/05/2020   NA 138 04/29/2021   K 4.2 04/29/2021   CALCIUM 9.6 04/29/2021   CO2 22 04/29/2021   GLUCOSE 230 (H) 04/29/2021    Lab Results  Component Value Date/Time   HGBA1C 8.1 (H) 04/29/2021 12:26 PM   HGBA1C 7.2 (H) 01/23/2021 11:54 AM   MICROALBUR 10 08/06/2020 01:40 PM   MICROALBUR 10 10/05/2019 12:37 PM    Last diabetic Eye exam:  Lab Results  Component Value Date/Time   HMDIABEYEEXA No Retinopathy  11/13/2020 12:00 AM    Last diabetic Foot exam: No results found for: HMDIABFOOTEX   Lab Results  Component Value Date   CHOL 145 04/29/2021   HDL 48 04/29/2021   LDLCALC 77 04/29/2021   TRIG 113 04/29/2021   CHOLHDL 3.0 04/29/2021    Hepatic Function Latest Ref Rng & Units 04/29/2021 01/23/2021 08/06/2020  Total Protein 6.0 - 8.5 g/dL 7.1 6.8 6.9  Albumin 3.7 - 4.7 g/dL 4.5 4.6 4.8(H)  AST 0 - 40 IU/L _0 ALT 0 - 32 IU/L 23 30 35(H)  Alk Phosphatase 44 - 121 IU/L 93 89 88  Total Bilirubin 0.0 - 1.2 mg/dL 0.6 0.6 0.5    Lab Results  Component Value Date/Time   TSH 0.694 04/29/2021 12:26 PM   TSH 1.500 11/05/2020 12:51 PM   FREET4 1.42 10/05/2019 08:55 AM   FREET4 1.50 05/10/2019 10:37 AM    CBC Latest Ref Rng & Units 01/23/2021 08/06/2020 11/17/2018  WBC 3.4 - 10.8 x10E3/uL 3.6 4.4 4.0  Hemoglobin 11.1 - 15.9 g/dL 11.0(L) 11.4 10.4(L)  Hematocrit 34.0 - 46.6 % 32.3(L) 33.7(L) 29.8(L)  Platelets 150 - 450 x10E3/uL 211 215 188    Lab Results  Component Value Date/Time   VD25OH 34.5 01/23/2021 11:54 AM   VD25OH 33.6 11/05/2020 12:51 PM    Clinical ASCVD: No  The 10-year ASCVD risk score Mikey Bussing DC Jr., et al., 2013) is: 18.9%   Values used to calculate the score:  Age: 32 years     Sex: Female     Is Non-Hispanic African American: Yes     Diabetic: Yes     Tobacco smoker: No     Systolic Blood Pressure: 314 mmHg     Is BP treated: Yes     HDL Cholesterol: 48 mg/dL     Total Cholesterol: 145 mg/dL    Depression screen [facility] 2/9 01/23/2021 12/10/2020 01/18/2020  Decreased Interest 0 1 0  Down, Depressed, Hopeless 0 1 0  PHQ - 2 Score 0 2 0  Altered sleeping - 1 0  Tired, decreased energy - 1 0  Change in appetite - 1 0  Feeling bad or failure about yourself  - 1 0  Trouble concentrating - 1 0  Moving slowly or fidgety/restless - 0 0  Suicidal thoughts - 0 0  PHQ-9 Score - 7 0  Difficult doing work/chores - - Not difficult at all    Social History    Tobacco Use  Smoking Status Never  Smokeless Tobacco Never   BP Readings from Last 3 Encounters:  04/29/21 114/70  01/23/21 138/60  01/23/21 138/60   Pulse Readings from Last 3 Encounters:  04/29/21 79  01/23/21 85  01/23/21 85   Wt Readings from Last 3 Encounters:  04/29/21 159 lb 9.6 oz (72.4 kg)  01/23/21 169 lb 5 oz (76.8 kg)  01/23/21 169 lb 6.4 oz (76.8 kg)   BMI Readings from Last 3 Encounters:  04/29/21 26.89 kg/m  01/23/21 29.80 kg/m  01/23/21 29.82 kg/m    Assessment/Interventions: Review of patient past medical history, allergies, medications, health status, including review of consultants reports, laboratory and other test data, was performed as part of comprehensive evaluation and provision of chronic care management services.   SDOH:  (Social Determinants of Health) assessments and interventions performed: No  SDOH Screenings   Alcohol Screen: Not on file  Depression (PHQ2-9): Low Risk    PHQ-2 Score: 0  Financial Resource Strain: Low Risk    Difficulty of Paying Living Expenses: Not hard at all  Food Insecurity: No Food Insecurity   Worried About Charity fundraiser in the Last Year: Never true   Ran Out of Food in the Last Year: Never true  Housing: Not on file  Physical Activity: Inactive   Days of Exercise per Week: 0 days   Minutes of Exercise per Session: 0 min  Social Connections: Not on file  Stress: No Stress Concern Present   Feeling of Stress : Not at all  Tobacco Use: Low Risk    Smoking Tobacco Use: Never   Smokeless Tobacco Use: Never  Transportation Needs: No Transportation Needs   Lack of Transportation (Medical): No   Lack of Transportation (Non-Medical): No    CCM Care Plan  No Known Allergies  Medications Reviewed Today     Reviewed by Mayford Knife, RPH (Pharmacist) on 06/25/21 at 1145  Med List Status: <None>   Medication Order Taking? Sig Documenting Provider Last Dose Status Informant  Accu-Chek FastClix  Lancets MISC [id number] Yes AS DIRECTED TWICE A DAY SUBCUTANEOUS 90 DAYS E11.9 [name], FNP Taking Active   Ascorbic Acid (VITAMIN C) 1000 MG tablet [id number] Yes Take 1,000 mg by mouth daily. [provider] Taking Active   aspirin 81 MG tablet [id number] Yes Take 81 mg by mouth daily. [provider] Taking Active Multiple Informants  atorvastatin (LIPITOR) 10 MG tablet [id number] Yes TAKE 1 TABLET BY MOUTH EVERY DAY  [name], FNP Taking Active   beta carotene w/minerals (OCUVITE) tablet [id number] Yes Take 1 tablet by mouth daily. [provider] Taking Active Multiple Informants           Med Note Kellie Simmering   Wed Aug 17, 2018  9:06 AM) Uses preservision  calcium citrate-vitamin D (CITRACAL+D) 315-200 MG-UNIT tablet [id number] Yes Take 1 tablet by mouth 2 (two) times daily. [provider] Taking Active   Cyanocobalamin (VITAMIN B 12 PO) [id number] Yes Take 1,000 mg by mouth daily. [provider] Taking Active Multiple Informants  dapagliflozin propanediol (FARXIGA) 10 MG TABS tablet [id number] Yes Take 1 tablet (10 mg total) by mouth daily. [name], FNP Taking Active   Dulaglutide (TRULICITY) 3 IW/5.8KD Bon[place] Aid [id number] Yes Inject 3 mg as directed once a week. [name], FNP Taking Active   ferrous sulfate 325 (65 FE) MG tablet [id number] Yes Take 1 tablet (325 mg total) by mouth 2 (two) times daily. [name], FNP Taking Active   glucose blood (ACCU-CHEK GUIDE) test strip [id number] Yes Use to check blood sugars twice daily E11.69 [name], FNP Taking Active   hydrocortisone (ANUSOL-HC) 2.5 % rectal cream [id number] Yes PLACE 1 APPLICATION RECTALLY 2 (TWO) TIMES DAILY. [name], FNP Taking Active   levothyroxine (SYNTHROID) 112 MCG tablet [id number] Yes TAKE 1 TABLET BY MOUTH EVERY DAY BEFORE Evelena Peat, FNP Taking Active   lisinopril (ZESTRIL) 2.5 MG tablet [id number] Yes Take 1 tablet (2.5 mg total) by mouth  daily. [name], FNP Taking Active   Multiple Vitamin (MULTIVITAMIN WITH MINERALS) TABS tablet [id number] Yes Take 1 tablet by mouth daily. [provider] Taking Active   mupirocin ointment (BACTROBAN) 2 % [id number] Yes APPLY TO AFFECTED AREA TWICE A DAY [name], FNP Taking Active   Omega-3 Fatty Acids (FISH OIL) 1000 MG CAPS [id number] Yes Take 1 capsule by mouth daily.  [provider] Taking Active Self  Wheat Dextrin (BENEFIBER DRINK MIX PO) [id number] Yes Take by mouth. [provider] Taking Active             Patient Active Problem List   Diagnosis Date Noted   Type 2 diabetes mellitus without complication, without long-term current use of insulin ([place]) 02/06/2019   Essential hypertension 02/06/2019   Hordeolum externum of left upper eyelid 12/14/2018   Benign hypertension with chronic kidney disease, stage III ([place]) 08/03/2018   Diabetes type 2, controlled 08/23/2014   Anemia 08/23/2014   Hypokalemia 08/23/2014   CAP (community acquired pneumonia)--treated 08/23/2014   Physical debility 08/16/2014   Pneumonia 08/13/2014   DKA, type 2 ([place]) 08/13/2014   Acute renal failure ([place]) 08/13/2014   Hypernatremia 08/13/2014   Elevated LFTs 08/13/2014   Acquired hypothyroidism 08/13/2014   DKA (diabetic ketoacidoses) 08/13/2014    Immunization History  Administered Date(s) Administered   Fluad Quad(high Dose 65+) 08/11/2019, 09/13/2020   Influenza, High Dose Seasonal PF 08/08/2018   Influenza,inj,Quad PF,6+ Mos 08/16/2014   Influenza-Unspecified 08/08/2018, 08/11/2019   PFIZER(Purple Top)SARS-COV-2 Vaccination 12/21/2019, 01/11/2020, 10/23/2020, 06/27/2021   Pneumococcal Conjugate-13 10/02/2020   Pneumococcal Polysaccharide-23 08/24/2019   Tdap 09/18/2019    Conditions to be addressed/monitored:  Hyperlipidemia and Diabetes  Care Plan : [place]  Updates made by Mayford Knife, [place] since 07/02/2021 12:00 AM      Problem: HLD, DM II   Priority: High     Long-Range Goal: Disease Management   This Visit's Progress: On track  Recent Progress:  On track  Priority: High  Note:   Current Barriers:  Unable to independently afford treatment regimen  Pharmacist Clinical Goal(s):  Patient will achieve adherence to monitoring guidelines and medication adherence to achieve therapeutic efficacy through collaboration with PharmD and provider.   Interventions: 1:1 collaboration with [name], FNP regarding development and update of comprehensive plan of care as evidenced by provider attestation and co-signature Inter-disciplinary care team collaboration (see longitudinal plan of care) Comprehensive medication review performed; medication list updated in electronic medical record  Hyperlipidemia: (LDL goal < 70) -Not ideally controlled -Current treatment: Atorvastatin 10 mg tablet once per day -Current dietary patterns: she is avoiding fried and fatty foods, she is using her [employer] and baking her meat and fish. She is using herb seasoning  -Educated on Benefits of statin for ASCVD risk reduction; Importance of limiting foods high in cholesterol; -Recommended to continue current medication  Diabetes (A1c goal <7%) -Not ideally controlled -Current medications: Farxiga 10 mg tablet once per day  Trulicity 3 VO/3.5KK - Inject 3 mg once per week  Currently taking 1.5 mg Trulicity once a week on Tuesday, she receives her medication through patient assistance.  -Medications previously tried: Metformin 500 mg,   -Current home glucose readings fasting glucose: 195, [phone]   -Denies hypoglycemic/hyperglycemic symptoms -Current meal patterns:  breakfast: oatmeal lunch: Tuna fish, peanut butter sandwich other times she might have [employer] Roast beef sandwich, Popeyes dinner: pasta, ground beef casserole, she snacks: eating cookies  drinks: she has cut back on the chocolate milk,  [place] Iced Tea lemon - drinking up to 4 cups of tea a day, she is drinking 16.9 fl ounce water - at least 4 per day -Current exercise: please see hyperlipidemia -She has 5 months left of free medication  -Educated on A1c and blood sugar goals; Complications of diabetes including kidney damage, retinal damage, and cardiovascular disease; Exercise goal of 150 minutes per week; -Patient reports that she does not want to increase her medication at this time. She reports -Counseled to check feet daily and get yearly eye exams -Recommended to continue current medication  Health Maintenance -Vaccine gaps: COVID-19 Booster 2nd Booster, Influenza Vaccine  -Discussed the importance of COVID 19 booster with patient -Collaborated with patient to create an appointment for her follow up Booster appointment.    Patient Goals/Self-Care Activities Patient will:  - take medications as prescribed  Follow Up Plan: The patient has been provided with contact information for the care management team and has been advised to call with any health related questions or concerns.       Medication Assistance:  Trulicity obtained through [employer] medication assistance program.  Enrollment ends 09/2021.   Compliance/Adherence/Medication fill history: Care Gaps: Influenza Vaccine   Star-Rating Drugs: Farxiga 10 mg tablet Trulicity 3 ZJ/6.9 ml Atorvastatin 10 mg tablet Lisinopril 2.5 mg tablet  Patient's preferred pharmacy [facility]:  [id number] [place] [place] - [address] [id number]: [phone]: [phone] Uses pill box? Yes Pt endorses 90% compliance  We discussed: Benefits of medication synchronization, packaging and delivery as well as enhanced pharmacist oversight with [employer]. Patient decided to: Continue current medication management strategy  Care Plan and Follow Up Patient Decision:  Patient  agrees to Care Plan and Follow-up.  Plan: The patient has been provided with contact information for the care management team and has been advised to call with any health related  questions or concerns.   Orlando Penner, PharmD Clinical Pharmacist [facility] [phone]

## 2021-06-30 ENCOUNTER — Encounter: Payer: Self-pay | Admitting: Nurse Practitioner

## 2021-07-02 NOTE — Patient Instructions (Signed)
Visit Information It was great speaking with you today!  Please let me know if you have any questions about our visit.   Goals Addressed             This Visit's Progress    Manage My Medicine       Timeframe:  Long-Range Goal Priority:  High Start Date:                             Expected End Date:                       Follow Up Date 08/20/2021   - call for medicine refill 2 or 3 days before it runs out - call if I am sick and can't take my medicine - keep a list of all the medicines I take; vitamins and herbals too - use a pillbox to sort medicine    Why is this important?   These steps will help you keep on track with your medicines.         Patient Care Plan: CCM Pharmacy Care Plan     Problem Identified: HLD, DM II   Priority: High     Long-Range Goal: Disease Management   This Visit's Progress: On track  Recent Progress: On track  Priority: High  Note:   Current Barriers:  Unable to independently afford treatment regimen  Pharmacist Clinical Goal(s):  Patient will achieve adherence to monitoring guidelines and medication adherence to achieve therapeutic efficacy through collaboration with PharmD and provider.   Interventions: 1:1 collaboration with [name], FNP regarding development and update of comprehensive plan of care as evidenced by provider attestation and co-signature Inter-disciplinary care team collaboration (see longitudinal plan of care) Comprehensive medication review performed; medication list updated in electronic medical record  Hyperlipidemia: (LDL goal < 70) -Not ideally controlled -Current treatment: Atorvastatin 10 mg tablet once per day -Current dietary patterns: she is avoiding fried and fatty foods, she is using her [employer] and baking her meat and fish. She is using herb seasoning  -Educated on Benefits of statin for ASCVD risk reduction; Importance of limiting foods high in cholesterol; -Recommended to  continue current medication  Diabetes (A1c goal <7%) -Not ideally controlled -Current medications: Farxiga 10 mg tablet once per day  Trulicity 3 mg/0.22ml - Inject 3 mg once per week  Currently taking 1.5 mg Trulicity once a week on Tuesday, she receives her medication through patient assistance.  -Medications previously tried: Metformin 500 mg,   -Current home glucose readings fasting glucose: 195, [phone]   -Denies hypoglycemic/hyperglycemic symptoms -Current meal patterns:  breakfast: oatmeal lunch: Tuna fish, peanut butter sandwich other times she might have [employer] Roast beef sandwich, Popeyes dinner: pasta, ground beef casserole, she snacks: eating cookies  drinks: she has cut back on the chocolate milk, [employer] Iced Tea lemon - drinking up to 4 cups of tea a day, she is drinking 16.9 fl ounce water - at least 4 per day -Current exercise: please see hyperlipidemia -She has 5 months left of free medication  -Educated on A1c and blood sugar goals; Complications of diabetes including kidney damage, retinal damage, and cardiovascular disease; Exercise goal of 150 minutes per week; -Patient reports that she does not want to increase her medication at this time. She reports -Counseled to check feet daily and get yearly eye exams -Recommended to continue current medication  Health Maintenance -Vaccine gaps:  COVID-19 Booster 2nd Booster, Influenza Vaccine  -Discussed the importance of COVID 19 booster with patient -Collaborated with patient to create an appointment for her follow up Booster appointment.    Patient Goals/Self-Care Activities Patient will:  - take medications as prescribed  Follow Up Plan: The patient has been provided with contact information for the care management team and has been advised to call with any health related questions or concerns.        Patient agreed to services and verbal consent obtained.   The patient verbalized understanding of  instructions, educational materials, and care plan provided today and agreed to receive a mailed copy of patient instructions, educational materials, and care plan.   Cherylin Mylar, PharmD Clinical Pharmacist [facility] [phone]

## 2021-08-12 ENCOUNTER — Encounter: Payer: Self-pay | Admitting: Nurse Practitioner

## 2021-08-12 ENCOUNTER — Ambulatory Visit (INDEPENDENT_AMBULATORY_CARE_PROVIDER_SITE_OTHER): Payer: Medicare Other | Admitting: Nurse Practitioner

## 2021-08-12 ENCOUNTER — Other Ambulatory Visit: Payer: Self-pay

## 2021-08-12 VITALS — Temp 98.8°F | Ht 64.0 in | Wt 161.2 lb

## 2021-08-12 DIAGNOSIS — Z0000 Encounter for general adult medical examination without abnormal findings: Secondary | ICD-10-CM

## 2021-08-12 DIAGNOSIS — E782 Mixed hyperlipidemia: Secondary | ICD-10-CM

## 2021-08-12 DIAGNOSIS — E2839 Other primary ovarian failure: Secondary | ICD-10-CM

## 2021-08-12 DIAGNOSIS — N1831 Chronic kidney disease, stage 3a: Secondary | ICD-10-CM

## 2021-08-12 DIAGNOSIS — Z23 Encounter for immunization: Secondary | ICD-10-CM

## 2021-08-12 DIAGNOSIS — I10 Essential (primary) hypertension: Secondary | ICD-10-CM

## 2021-08-12 DIAGNOSIS — E1122 Type 2 diabetes mellitus with diabetic chronic kidney disease: Secondary | ICD-10-CM

## 2021-08-12 DIAGNOSIS — Z1231 Encounter for screening mammogram for malignant neoplasm of breast: Secondary | ICD-10-CM

## 2021-08-12 LAB — POCT URINALYSIS DIPSTICK
Bilirubin, UA: NEGATIVE
Blood, UA: NEGATIVE
Glucose, UA: NEGATIVE
Ketones, UA: NEGATIVE
Leukocytes, UA: NEGATIVE
Nitrite, UA: NEGATIVE
Protein, UA: NEGATIVE
Spec Grav, UA: <=1.005 — AB (ref 1.010–1.025)
Urobilinogen, UA: 0.2 E.U./dL
pH, UA: 5.5 (ref 5.0–8.0)

## 2021-08-12 LAB — POCT UA - MICROALBUMIN
Albumin/Creatinine Ratio, Urine, POC: <30
Creatinine, POC: 200 mg/dL
Microalbumin Ur, POC: 10 mg/L

## 2021-08-12 NOTE — Patient Instructions (Signed)
Health Maintenance, Female Adopting a healthy lifestyle and getting preventive care are important in promoting health and wellness. Ask your health care provider about: The right schedule for you to have regular tests and exams. Things you can do on your own to prevent diseases and keep yourself healthy. What should I know about diet, weight, and exercise? Eat a healthy diet  Eat a diet that includes plenty of vegetables, fruits, low-fat dairy products, and lean protein. Do not eat a lot of foods that are high in solid fats, added sugars, or sodium. Maintain a healthy weight Body mass index (BMI) is used to identify weight problems. It estimates body fat based on height and weight. Your health care provider can help determine your BMI and help you achieve or maintain a healthy weight. Get regular exercise Get regular exercise. This is one of the most important things you can do for your health. Most adults should: Exercise for at least 150 minutes each week. The exercise should increase your heart rate and make you sweat (moderate-intensity exercise). Do strengthening exercises at least twice a week. This is in addition to the moderate-intensity exercise. Spend less time sitting. Even light physical activity can be beneficial. Watch cholesterol and blood lipids Have your blood tested for lipids and cholesterol at 74 years of age, then have this test every 5 years. Have your cholesterol levels checked more often if: Your lipid or cholesterol levels are high. You are older than 74 years of age. You are at high risk for heart disease. What should I know about cancer screening? Depending on your health history and family history, you may need to have cancer screening at various ages. This may include screening for: Breast cancer. Cervical cancer. Colorectal cancer. Skin cancer. Lung cancer. What should I know about heart disease, diabetes, and high blood pressure? Blood pressure and heart  disease High blood pressure causes heart disease and increases the risk of stroke. This is more likely to develop in people who have high blood pressure readings, are of African descent, or are overweight. Have your blood pressure checked: Every 3-5 years if you are 18-39 years of age. Every year if you are 40 years old or older. Diabetes Have regular diabetes screenings. This checks your fasting blood sugar level. Have the screening done: Once every three years after age 40 if you are at a normal weight and have a low risk for diabetes. More often and at a younger age if you are overweight or have a high risk for diabetes. What should I know about preventing infection? Hepatitis B If you have a higher risk for hepatitis B, you should be screened for this virus. Talk with your health care provider to find out if you are at risk for hepatitis B infection. Hepatitis C Testing is recommended for: Everyone born from 1945 through 1965. Anyone with known risk factors for hepatitis C. Sexually transmitted infections (STIs) Get screened for STIs, including gonorrhea and chlamydia, if: You are sexually active and are younger than 74 years of age. You are older than 74 years of age and your health care provider tells you that you are at risk for this type of infection. Your sexual activity has changed since you were last screened, and you are at increased risk for chlamydia or gonorrhea. Ask your health care provider if you are at risk. Ask your health care provider about whether you are at high risk for HIV. Your health care provider may recommend a prescription medicine   to help prevent HIV infection. If you choose to take medicine to prevent HIV, you should first get tested for HIV. You should then be tested every 3 months for as long as you are taking the medicine. Pregnancy If you are about to stop having your period (premenopausal) and you may become pregnant, seek counseling before you get  pregnant. Take 400 to 800 micrograms (mcg) of folic acid every day if you become pregnant. Ask for birth control (contraception) if you want to prevent pregnancy. Osteoporosis and menopause Osteoporosis is a disease in which the bones lose minerals and strength with aging. This can result in bone fractures. If you are 65 years old or older, or if you are at risk for osteoporosis and fractures, ask your health care provider if you should: Be screened for bone loss. Take a calcium or vitamin D supplement to lower your risk of fractures. Be given hormone replacement therapy (HRT) to treat symptoms of menopause. Follow these instructions at home: Lifestyle Do not use any products that contain nicotine or tobacco, such as cigarettes, e-cigarettes, and chewing tobacco. If you need help quitting, ask your health care provider. Do not use street drugs. Do not share needles. Ask your health care provider for help if you need support or information about quitting drugs. Alcohol use Do not drink alcohol if: Your health care provider tells you not to drink. You are pregnant, may be pregnant, or are planning to become pregnant. If you drink alcohol: Limit how much you use to 0-1 drink a day. Limit intake if you are breastfeeding. Be aware of how much alcohol is in your drink. In the [place] one drink equals one 12 oz bottle of beer (355 mL), one 5 oz glass of wine (148 mL), or one 1 oz glass of hard liquor (44 mL). General instructions Schedule regular health, dental, and eye exams. Stay current with your vaccines. Tell your health care provider if: You often feel depressed. You have ever been abused or do not feel safe at home. Summary Adopting a healthy lifestyle and getting preventive care are important in promoting health and wellness. Follow your health care provider's instructions about healthy diet, exercising, and getting tested or screened for diseases. Follow your health care provider's  instructions on monitoring your cholesterol and blood pressure. This information is not intended to replace advice given to you by your health care provider. Make sure you discuss any questions you have with your health care provider. Document Revised: 01/10/2021 Document Reviewed: 10/26/2018 Elsevier Patient Education  2022 [employer] Inc.  

## 2021-08-12 NOTE — Progress Notes (Signed)
This visit occurred during the SARS-CoV-2 public health emergency.  Safety protocols were in place, including screening questions prior to the visit, additional usage of staff PPE, and extensive cleaning of exam room while observing appropriate contact time as indicated for disinfecting solutions.  Subjective:     Patient ID: Catherine [name] , female    DOB: [birth year] , 75 y.o.   MRN: [id number]   Chief Complaint  Patient presents with   Annual Exam    HPI  Here for HM, no concerns  Wt Readings from Last 3 Encounters: 08/12/21 : 161 lb 3.2 oz (73.1 kg) 04/29/21 : 159 lb 9.6 oz (72.4 kg) 01/23/21 : 169 lb 5 oz (76.8 kg)     Diabetes She presents for her follow-up diabetic visit. She has type 2 diabetes mellitus. Her disease course has been stable. There are no hypoglycemic associated symptoms. Pertinent negatives for hypoglycemia include no confusion, dizziness, headaches or nervousness/anxiousness. Pertinent negatives for diabetes include no blurred vision, no chest pain, no fatigue, no polydipsia, no polyphagia and no polyuria. There are no hypoglycemic complications. Symptoms are stable. There are no diabetic complications. Pertinent negatives for diabetic complications include no CVA. Risk factors for coronary artery disease include diabetes mellitus and sedentary lifestyle. Current diabetic treatment includes oral agent (dual therapy). She is compliant with treatment all of the time. Her weight is decreasing steadily. She is following a diabetic diet. When asked about meal planning, she reported none. She has not had a previous visit with a dietitian. She rarely participates in exercise. (Blood sugars 130-149) An ACE inhibitor/angiotensin II receptor blocker is being taken. She does not see a podiatrist.Eye exam is not current.  Hypertension This is a chronic problem. The current episode started more than 1 year ago. The problem is unchanged. The problem is controlled. Pertinent  negatives include no anxiety, blurred vision, chest pain, headaches or palpitations. There are no associated agents to hypertension. Risk factors for coronary artery disease include obesity and sedentary lifestyle. Past treatments include ACE inhibitors. The current treatment provides significant improvement. There are no compliance problems.  There is no history of CVA. There is no history of chronic renal disease.    Past Medical History:  Diagnosis Date   CAP (community acquired pneumonia)--treated 08/23/2014   Diabetes mellitus without complication (Success)    DKA (diabetic ketoacidoses) 08/13/2014   Hypertension    Pneumonia 08/13/2014   Thyroid disease      Family History  Problem Relation Age of Onset   Diabetes Mellitus II Mother    Stroke Mother    Diabetes Mellitus II Sister    Hypertension Sister      Current Outpatient Medications:    Accu-Chek FastClix Lancets MISC, AS DIRECTED TWICE A DAY SUBCUTANEOUS 90 DAYS E11.9, Disp: 204 each, Rfl: 4   Ascorbic Acid (VITAMIN C) 1000 MG tablet, Take 1,000 mg by mouth daily., Disp: , Rfl:    aspirin 81 MG tablet, Take 81 mg by mouth daily., Disp: , Rfl:    atorvastatin (LIPITOR) 10 MG tablet, TAKE 1 TABLET BY MOUTH EVERY DAY, Disp: 90 tablet, Rfl: 0   Cyanocobalamin (VITAMIN B 12 PO), Take 1,000 mg by mouth daily., Disp: , Rfl:    dapagliflozin propanediol (FARXIGA) 10 MG TABS tablet, Take 1 tablet (10 mg total) by mouth daily., Disp: 30 tablet, Rfl: 5   Dulaglutide (TRULICITY) 3 YF/7.4BS SOPN, Inject 3 mg as directed once a week., Disp: 12 mL, Rfl: 1   ferrous sulfate 325 (  65 FE) MG tablet, Take 1 tablet (325 mg total) by mouth 2 (two) times daily., Disp: 180 tablet, Rfl: 1   glucose blood (ACCU-CHEK GUIDE) test strip, Use to check blood sugars twice daily E11.69, Disp: 100 each, Rfl: 2   hydrocortisone (ANUSOL-HC) 2.5 % rectal cream, PLACE 1 APPLICATION RECTALLY 2 (TWO) TIMES DAILY., Disp: 30 g, Rfl: 2   levothyroxine (SYNTHROID) 112 MCG  tablet, TAKE 1 TABLET BY MOUTH EVERY DAY BEFORE BREAKFAST, Disp: 90 tablet, Rfl: 0   lisinopril (ZESTRIL) 2.5 MG tablet, Take 1 tablet (2.5 mg total) by mouth daily., Disp: 90 tablet, Rfl: 1   Multiple Vitamin (MULTIVITAMIN WITH MINERALS) TABS tablet, Take 1 tablet by mouth daily., Disp: , Rfl:    mupirocin ointment (BACTROBAN) 2 %, APPLY TO AFFECTED AREA TWICE A DAY, Disp: 22 g, Rfl: 0   Omega-3 Fatty Acids (FISH OIL) 1000 MG CAPS, Take 1 capsule by mouth daily. , Disp: , Rfl:    Wheat Dextrin (BENEFIBER DRINK MIX PO), Take by mouth., Disp: , Rfl:    beta carotene w/minerals (OCUVITE) tablet, Take 1 tablet by mouth daily. (Patient not taking: Reported on 08/12/2021), Disp: , Rfl:    calcium citrate-vitamin D (CITRACAL+D) 315-200 MG-UNIT tablet, Take 1 tablet by mouth 2 (two) times daily., Disp: , Rfl:    No Known Allergies    The patient states she is post menopausal status.  No LMP recorded. Patient is postmenopausal.. Negative for Dysmenorrhea and Negative for Menorrhagia. Negative for: breast discharge, breast lump(s), breast pain and breast self exam. Associated symptoms include abnormal vaginal bleeding. Pertinent negatives include abnormal bleeding (hematology), anxiety, decreased libido, depression, difficulty falling sleep, dyspareunia, history of infertility, nocturia, sexual dysfunction, sleep disturbances, urinary incontinence, urinary urgency, vaginal discharge and vaginal itching. Diet regular.The patient states her exercise level is    . The patient's tobacco use is:  Social History   Tobacco Use  Smoking Status Never  Smokeless Tobacco Never  . She has been exposed to passive smoke. The patient's alcohol use is:  Social History   Substance and Sexual Activity  Alcohol Use No     Review of Systems  Constitutional: Negative.  Negative for fatigue.  HENT: Negative.    Eyes: Negative.  Negative for blurred vision.  Respiratory: Negative.    Cardiovascular: Negative.   Negative for chest pain, palpitations and leg swelling.  Gastrointestinal: Negative.   Endocrine: Negative.  Negative for polydipsia, polyphagia and polyuria.  Genitourinary: Negative.   Musculoskeletal: Negative.   Skin: Negative.   Allergic/Immunologic: Negative.   Neurological: Negative.  Negative for dizziness and headaches.  Hematological: Negative.   Psychiatric/Behavioral: Negative.  Negative for confusion. The patient is not nervous/anxious.     Today's Vitals   08/12/21 1114  Temp: 98.8 F (37.1 C)  Weight: 161 lb 3.2 oz (73.1 kg)  Height: 5' 4" (1.626 m)  PainSc: 0-No pain   Body mass index is 27.67 kg/m.   Objective:  Physical Exam Vitals reviewed.  Constitutional:      General: She is not in acute distress.    Appearance: Normal appearance. She is well-developed.  HENT:     Head: Normocephalic and atraumatic.     Right Ear: Hearing, tympanic membrane, ear canal and external ear normal. There is no impacted cerumen.     Left Ear: Hearing, tympanic membrane, ear canal and external ear normal. There is no impacted cerumen.     Nose:     Comments: Deferred - maksed      Mouth/Throat:     Comments: Deferred - masked Eyes:     General: Lids are normal.     Extraocular Movements: Extraocular movements intact.     Conjunctiva/sclera: Conjunctivae normal.     Pupils: Pupils are equal, round, and reactive to light.     Funduscopic exam:    Right eye: No papilledema.        Left eye: No papilledema.  Neck:     Thyroid: No thyroid mass.     Vascular: No carotid bruit.  Cardiovascular:     Rate and Rhythm: Normal rate and regular rhythm.     Pulses: Normal pulses.     Heart sounds: Normal heart sounds. No murmur heard. Pulmonary:     Effort: Pulmonary effort is normal. No respiratory distress.     Breath sounds: Normal breath sounds. No wheezing.  Abdominal:     General: Abdomen is flat. Bowel sounds are normal. There is no distension.     Palpations: Abdomen is  soft.     Tenderness: There is no abdominal tenderness.  Musculoskeletal:        General: No swelling. Normal range of motion.     Cervical back: Full passive range of motion without pain, normal range of motion and neck supple.     Right lower leg: No edema.     Left lower leg: No edema.  Skin:    General: Skin is warm and dry.     Capillary Refill: Capillary refill takes less than 2 seconds.  Neurological:     General: No focal deficit present.     Mental Status: She is alert and oriented to person, place, and time.     Cranial Nerves: No cranial nerve deficit.     Sensory: No sensory deficit.     Motor: No weakness.  Psychiatric:        Mood and Affect: Mood normal.        Behavior: Behavior normal.        Thought Content: Thought content normal.        Judgment: Judgment normal.        Assessment And Plan:     1. Encounter for general adult medical examination w/o abnormal findings - CMP14+EGFR - CBC - POCT Urinalysis Dipstick (81002) - POCT UA - Microalbumin  2. Screening mammogram, encounter for Pt instructed on Self Breast Exam.According to ACOG guidelines Women aged 4 and older are recommended to get an annual mammogram. Form completed and given to patient contact the [facility] for appointment scheduing.  Pt encouraged to get annual mammogram - MM DIGITAL SCREENING BILATERAL; Future  3. Decreased estrogen level - DG Bone Density; Future  4. Immunization due Influenza vaccine administered Encouraged to take Tylenol as needed for fever or muscle aches. - Flu Vaccine QUAD High Dose(Fluad)  5. Mixed hyperlipidemia Chronic, stable and tolerating statin well - Lipid panel  6. Type 2 diabetes mellitus with stage 3a chronic kidney disease, without long-term current use of insulin (HCC) Comments: Diabetic foot exam done, no abnormal findings HgbA1c slightly worse at last visit Continue with current medications Encouraged to limit intake of sugary foods and  drinks Encouraged to increase physical activity to 150 minutes per week as tolerated Eye exam is up to date - Hemoglobin A1c  7. Essential hypertension B/P is controlled.  CMP ordered to check renal function.  The importance of regular exercise and dietary modification was stressed to the patient.  Stressed importance of losing ten  percent of her body weight to help with B/P control.  The weight loss would help with decreasing cardiac and cancer risk as well.  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 Patient verbalized understanding of the plan and was able to repeat key elements of the plan. All questions were answered to their satisfaction.   [name], FNP   I, [name], FNP, have reviewed all documentation for this visit. The documentation on 08/13/21 for the exam, diagnosis, procedures, and orders are all accurate and complete.  THE PATIENT IS ENCOURAGED TO PRACTICE SOCIAL DISTANCING DUE TO THE COVID-19 PANDEMIC.

## 2021-08-13 ENCOUNTER — Encounter: Payer: Self-pay | Admitting: Nurse Practitioner

## 2021-08-13 LAB — CMP14+EGFR
ALT: 26 IU/L (ref 0–32)
AST: 19 IU/L (ref 0–40)
Albumin/Globulin Ratio: 2.9 — ABNORMAL HIGH (ref 1.2–2.2)
Albumin: 5.2 g/dL — ABNORMAL HIGH (ref 3.7–4.7)
Alkaline Phosphatase: 87 IU/L (ref 44–121)
BUN/Creatinine Ratio: 18 (ref 12–28)
BUN: 21 mg/dL (ref 8–27)
Bilirubin Total: 0.5 mg/dL (ref 0.0–1.2)
CO2: 23 mmol/L (ref 20–29)
Calcium: 10 mg/dL (ref 8.7–10.3)
Chloride: 105 mmol/L (ref 96–106)
Creatinine, Ser: 1.18 mg/dL — ABNORMAL HIGH (ref 0.57–1.00)
Globulin, Total: 1.8 g/dL (ref 1.5–4.5)
Glucose: 133 mg/dL — ABNORMAL HIGH (ref 70–99)
Potassium: 4.7 mmol/L (ref 3.5–5.2)
Sodium: 144 mmol/L (ref 134–144)
Total Protein: 7 g/dL (ref 6.0–8.5)
eGFR: 48 mL/min/{1.73_m2} — ABNORMAL LOW (ref >59)

## 2021-08-13 LAB — HEMOGLOBIN A1C
Est. average glucose Bld gHb Est-mCnc: 189 mg/dL
Hgb A1c MFr Bld: 8.2 % — ABNORMAL HIGH (ref 4.8–5.6)

## 2021-08-13 LAB — CBC
Hematocrit: 37.4 % (ref 34.0–46.6)
Hemoglobin: 12.5 g/dL (ref 11.1–15.9)
MCH: 29.4 pg (ref 26.6–33.0)
MCHC: 33.4 g/dL (ref 31.5–35.7)
MCV: 88 fL (ref 79–97)
Platelets: 205 10*3/uL (ref 150–450)
RBC: 4.25 x10E6/uL (ref 3.77–5.28)
RDW: 12.4 % (ref 11.7–15.4)
WBC: 3.7 10*3/uL (ref 3.4–10.8)

## 2021-08-13 LAB — LIPID PANEL
Chol/HDL Ratio: 2.8 ratio (ref 0.0–4.4)
Cholesterol, Total: 151 mg/dL (ref 100–199)
HDL: 54 mg/dL (ref >39)
LDL Chol Calc (NIH): 78 mg/dL (ref 0–99)
Triglycerides: 101 mg/dL (ref 0–149)
VLDL Cholesterol Cal: 19 mg/dL (ref 5–40)

## 2021-08-14 ENCOUNTER — Telehealth: Payer: Self-pay

## 2021-08-15 NOTE — Chronic Care Management (AMB) (Signed)
08/14/2021- Patient called to reschedule appointment on 08/20/2021 at 12pm due to husband having an appointment same day with Cardiologist at 11 am and was not sure if she would make it home in time to make telephone visit. Unable to reschedule patient for same day due to schedule being full. Rescheduled patient to 09/10/2021 at 4pm. Patient ok with appointment change.   Billee Cashing, CMA Clinical Pharmacist Assistant [phone]

## 2021-08-17 ENCOUNTER — Other Ambulatory Visit: Payer: Self-pay | Admitting: Nurse Practitioner

## 2021-08-20 ENCOUNTER — Telehealth: Payer: Federal, State, Local not specified - PPO

## 2021-09-09 ENCOUNTER — Telehealth: Payer: Self-pay

## 2021-09-09 NOTE — Chronic Care Management (AMB) (Signed)
    Called [facility], No answer, unable to leave voicemail of appointment on 09-10-2021 at 4:00 via telephone visit with Cherylin Mylar, Pharm D.   Care Gaps: AWV 02-12-22. Covid booster overdue  Star Rating Drug: Trulicity 3mg - Patient Assistance (Patient reported taking 1.5 mg) Lisinopril 2.5- Last filled 08-24-2021 [facility] Farxiga 10mg - Last filled 08-27-2021 [facility] Atorvastatin 10mg - Last filled 08-18-2021 [facility]  Any gaps in medications fill history? No  10-27-2021 [facility] Clinical Pharmacist Assistant [phone]

## 2021-09-10 ENCOUNTER — Ambulatory Visit (INDEPENDENT_AMBULATORY_CARE_PROVIDER_SITE_OTHER): Payer: Medicare Other

## 2021-09-10 DIAGNOSIS — N1831 Chronic kidney disease, stage 3a: Secondary | ICD-10-CM

## 2021-09-10 DIAGNOSIS — E782 Mixed hyperlipidemia: Secondary | ICD-10-CM

## 2021-09-10 DIAGNOSIS — E1122 Type 2 diabetes mellitus with diabetic chronic kidney disease: Secondary | ICD-10-CM

## 2021-09-10 NOTE — Progress Notes (Signed)
Chronic Care Management Pharmacy Note  09/11/2021 Name:  Catherine [name] MRN:  [id number] DOB:  20-Feb-1946  Summary: Patient reports that she has been doing well   Recommendations/Changes made from today's visit: Patient reported a BP reading that was low and she was concerned about the readings.   Plan: Patient to have a follow up visit with [facility] tomorrow, 09/11/2021.    Subjective: Catherine [name] is an 75 y.o. year old female who is a primary patient of [name], [place].  The CCM team was consulted for assistance with disease management and care coordination needs.    Engaged with patient by telephone for follow up visit in response to provider referral for pharmacy case management and/or care coordination services. Patient reports that a pipe burst and she had get everything replaced.   Consent to Services:  The patient was given information about Chronic Care Management services, agreed to services, and gave verbal consent prior to initiation of services.  Please see initial visit note for detailed documentation.   Patient Care Team: [name], FNP as PCP - General ([place]) Brunetta Genera, MD as Consulting Physician (Hematology) Mayford Knife, [facility] (Pharmacist)  Recent office visits: 08/12/2021 PCP [facility]  04/29/2021 PCP [facility] visits: None in previous 6 months   Objective:  Lab Results  Component Value Date   CREATININE 1.18 (H) 08/12/2021   BUN 21 08/12/2021   GFRNONAA 42 (L) 11/05/2020   GFRAA 48 (L) 11/05/2020   NA 144 08/12/2021   K 4.7 08/12/2021   CALCIUM 10.0 08/12/2021   CO2 23 08/12/2021   GLUCOSE 133 (H) 08/12/2021    Lab Results  Component Value Date/Time   HGBA1C 8.2 (H) 08/12/2021 12:25 PM   HGBA1C 8.1 (H) 04/29/2021 12:26 PM   MICROALBUR 10 08/12/2021 12:52 PM   MICROALBUR 10 08/06/2020 01:40 PM    Last diabetic Eye exam:  Lab Results  Component Value Date/Time   HMDIABEYEEXA No Retinopathy  11/13/2020 12:00 AM    Last diabetic Foot exam: No results found for: HMDIABFOOTEX   Lab Results  Component Value Date   CHOL 151 08/12/2021   HDL 54 08/12/2021   LDLCALC 78 08/12/2021   TRIG 101 08/12/2021   CHOLHDL 2.8 08/12/2021    Hepatic Function Latest Ref Rng & Units 08/12/2021 04/29/2021 01/23/2021  Total Protein 6.0 - 8.5 g/dL 7.0 7.1 6.8  Albumin 3.7 - 4.7 g/dL 5.2(H) 4.5 4.6  AST 0 - 40 IU/L _0 ALT 0 - 32 IU/L _1 Alk Phosphatase 44 - 121 IU/L 87 93 89  Total Bilirubin 0.0 - 1.2 mg/dL 0.5 0.6 0.6    Lab Results  Component Value Date/Time   TSH 0.694 04/29/2021 12:26 PM   TSH 1.500 11/05/2020 12:51 PM   FREET4 1.42 10/05/2019 08:55 AM   FREET4 1.50 05/10/2019 10:37 AM    CBC Latest Ref Rng & Units 08/12/2021 01/23/2021 08/06/2020  WBC 3.4 - 10.8 x10E3/uL 3.7 3.6 4.4  Hemoglobin 11.1 - 15.9 g/dL 12.5 11.0(L) 11.4  Hematocrit 34.0 - 46.6 % 37.4 32.3(L) 33.7(L)  Platelets 150 - 450 x10E3/uL 205 211 215    Lab Results  Component Value Date/Time   VD25OH 34.5 01/23/2021 11:54 AM   VD25OH 33.6 11/05/2020 12:51 PM    Clinical ASCVD: No  The 10-year ASCVD risk score (Arnett DK, et al., 2019) is: 21.8%   Values used to calculate the score:  Age: 39 years     Sex: Female     Is Non-Hispanic African American: Yes     Diabetic: Yes     Tobacco smoker: No     Systolic Blood Pressure: 616 mmHg     Is BP treated: Yes     HDL Cholesterol: 54 mg/dL     Total Cholesterol: 151 mg/dL    Depression screen [facility] 2/9 01/23/2021 12/10/2020 01/18/2020  Decreased Interest 0 1 0  Down, Depressed, Hopeless 0 1 0  PHQ - 2 Score 0 2 0  Altered sleeping - 1 0  Tired, decreased energy - 1 0  Change in appetite - 1 0  Feeling bad or failure about yourself  - 1 0  Trouble concentrating - 1 0  Moving slowly or fidgety/restless - 0 0  Suicidal thoughts - 0 0  PHQ-9 Score - 7 0  Difficult doing work/chores - - Not difficult at all     Social History   Tobacco Use   Smoking Status Never  Smokeless Tobacco Never   BP Readings from Last 3 Encounters:  09/11/21 116/78  04/29/21 114/70  01/23/21 138/60   Pulse Readings from Last 3 Encounters:  09/11/21 71  04/29/21 79  01/23/21 85   Wt Readings from Last 3 Encounters:  09/11/21 156 lb 12.8 oz (71.1 kg)  08/12/21 161 lb 3.2 oz (73.1 kg)  04/29/21 159 lb 9.6 oz (72.4 kg)   BMI Readings from Last 3 Encounters:  09/11/21 26.91 kg/m  08/12/21 27.67 kg/m  04/29/21 26.89 kg/m    Assessment/Interventions: Review of patient past medical history, allergies, medications, health status, including review of consultants reports, laboratory and other test data, was performed as part of comprehensive evaluation and provision of chronic care management services.   SDOH:  (Social Determinants of Health) assessments and interventions performed: No  SDOH Screenings   Alcohol Screen: Not on file  Depression (PHQ2-9): Low Risk    PHQ-2 Score: 0  Financial Resource Strain: Low Risk    Difficulty of Paying Living Expenses: Not hard at all  Food Insecurity: No Food Insecurity   Worried About Charity fundraiser in the Last Year: Never true   Ran Out of Food in the Last Year: Never true  Housing: Not on file  Physical Activity: Inactive   Days of Exercise per Week: 0 days   Minutes of Exercise per Session: 0 min  Social Connections: Not on file  Stress: No Stress Concern Present   Feeling of Stress : Not at all  Tobacco Use: Low Risk    Smoking Tobacco Use: Never   Smokeless Tobacco Use: Never   Passive Exposure: Not on file  Transportation Needs: No Transportation Needs   Lack of Transportation (Medical): No   Lack of Transportation (Non-Medical): No    CCM Care Plan  No Known Allergies  Medications Reviewed Today     Reviewed by Michelle Nasuti, [place] (Certified Medical Assistant) on 09/11/21 at 1526  Med List Status: <None>   Medication Order Taking? Sig Documenting Provider Last Dose  Status Informant  Accu-Chek FastClix Lancets MISC [id number] Yes AS DIRECTED TWICE A DAY SUBCUTANEOUS 90 DAYS E11.9 [name], FNP Taking Active   Ascorbic Acid (VITAMIN C) 1000 MG tablet [id number] Yes Take 1,000 mg by mouth daily. [provider] Taking Active   aspirin 81 MG tablet [id number] Yes Take 81 mg by mouth daily. [provider] Taking Active Multiple Informants  atorvastatin (LIPITOR) 10 MG tablet  [id number] Yes TAKE 1 TABLET BY MOUTH EVERY DAY [name], FNP Taking Active   beta carotene w/minerals (OCUVITE) tablet [id number] Yes Take 1 tablet by mouth daily. [provider] Taking Active            Med Note Kellie Simmering   Wed Aug 17, 2018  9:06 AM) Uses preservision  calcium citrate-vitamin D (CITRACAL+D) 315-200 MG-UNIT tablet [id number] Yes Take 1 tablet by mouth 2 (two) times daily. [provider] Taking Active   Cyanocobalamin (VITAMIN B 12 PO) [id number] Yes Take 1,000 mg by mouth daily. [provider] Taking Active Multiple Informants  dapagliflozin propanediol (FARXIGA) 10 MG TABS tablet [id number] Yes Take 1 tablet (10 mg total) by mouth daily. [name], FNP Taking Active   Dulaglutide (TRULICITY) 3 WN/4.6EV [place] [id number] Yes Inject 3 mg as directed once a week. [name], FNP Taking Active   ferrous sulfate 325 (65 FE) MG tablet [id number] Yes Take 1 tablet (325 mg total) by mouth 2 (two) times daily. [name], FNP Taking Active   glucose blood (ACCU-CHEK GUIDE) test strip [id number] Yes Use to check blood sugars twice daily E11.69 [name], FNP Taking Active   hydrocortisone (ANUSOL-HC) 2.5 % rectal cream [id number] Yes PLACE 1 APPLICATION RECTALLY 2 (TWO) TIMES DAILY. [name], FNP Taking Active   levothyroxine (SYNTHROID) 112 MCG tablet [id number] Yes TAKE 1 TABLET BY MOUTH EVERY DAY BEFORE Evelena Peat, FNP Taking Active   lisinopril (ZESTRIL) 2.5 MG tablet [id number] Yes Take 1 tablet (2.5  mg total) by mouth daily. [name], FNP Taking Active   Multiple Vitamin (MULTIVITAMIN WITH MINERALS) TABS tablet [id number] Yes Take 1 tablet by mouth daily. [provider] Taking Active   mupirocin ointment (BACTROBAN) 2 % [id number] Yes APPLY TO AFFECTED AREA TWICE A DAY [name], FNP Taking Active   Omega-3 Fatty Acids (FISH OIL) 1000 MG CAPS [id number] Yes Take 1 capsule by mouth daily.  [provider] Taking Active Self  Wheat Dextrin (BENEFIBER DRINK MIX PO) [id number] Yes Take by mouth. [provider] Taking Active             Patient Active Problem List   Diagnosis Date Noted   Type 2 diabetes mellitus without complication, without long-term current use of insulin ([place]) 02/06/2019   Essential hypertension 02/06/2019   Hordeolum externum of left upper eyelid 12/14/2018   Benign hypertension with chronic kidney disease, stage III ([place]) 08/03/2018   Diabetes type 2, controlled 08/23/2014   Anemia 08/23/2014   Hypokalemia 08/23/2014   DKA, type 2 ([place]) 08/13/2014   Hypernatremia 08/13/2014   Elevated LFTs 08/13/2014   Acquired hypothyroidism 08/13/2014    Immunization History  Administered Date(s) Administered   Fluad Quad(high Dose 65+) 08/11/2019, 09/13/2020, 08/12/2021   Influenza, High Dose Seasonal PF 08/08/2018   Influenza,inj,Quad PF,6+ Mos 08/16/2014   Influenza-Unspecified 08/08/2018, 08/11/2019   PFIZER(Purple Top)SARS-COV-2 Vaccination 12/21/2019, 01/11/2020, 10/23/2020, 06/27/2021   Pneumococcal Conjugate-13 10/02/2020   Pneumococcal Polysaccharide-23 08/24/2019   Tdap 09/18/2019    Conditions to be addressed/monitored:  Hyperlipidemia and Diabetes  Care Plan : [place]  Updates made by Mayford Knife, [place] since 09/11/2021 12:00 AM     Problem: HLD, DM II   Priority: High     Long-Range Goal: Disease Management   Recent Progress: On track  Priority: High  Note:   Current Barriers:  Unable to  independently monitor therapeutic efficacy  Pharmacist Clinical Goal(s):  Patient will achieve  adherence to monitoring guidelines and medication adherence to achieve therapeutic efficacy through collaboration with PharmD and provider.   Interventions: 1:1 collaboration with [name], FNP regarding development and update of comprehensive plan of care as evidenced by provider attestation and co-signature Inter-disciplinary care team collaboration (see longitudinal plan of care) Comprehensive medication review performed; medication list updated in electronic medical record  Hyperlipidemia: (LDL goal < 70) -Controlled -Current treatment: Atorvastatin 10 mg tablet once daily  -Current dietary patterns: she is cutting back on her fried and fatty foods  -Current exercise habits: she is walking everyday  -Educated on Benefits of statin for ASCVD risk reduction; Importance of limiting foods high in cholesterol; -Counseled on diet and exercise extensively Recommended to continue current medication   Diabetes (A1c goal <7%) -Uncontrolled -Current medications: Trulicity 1.5 mg - inject once per week  Farxiga 10 mg tablet once per day  -Current home glucose readings fasting glucose: 139, 159, 138, 143,151,133, 129, 150, 130, 130,149 post prandial glucose: 150 -Denies hypoglycemic/hyperglycemic symptoms -Current meal patterns:  breakfast: oatmeal and toast, fruit   lunch: mid day apple   drinks: she is drinking more water -Current exercise: she has increased her activity  -Educated on Complications of diabetes including kidney damage, retinal damage, and cardiovascular disease; Counseled to check feet daily and get yearly eye exams -Counseled on diet and exercise extensively  Patient Goals/Self-Care Activities Patient will:  - take medications as prescribed  Follow Up Plan: The patient has been provided with contact information for the care management team and has been advised to  call with any health related questions or concerns.       Medication Assistance:  Trulicity obtained through [employer] medication assistance program.  Enrollment ends 10/2021  Compliance/Adherence/Medication fill history: Care Gaps: COVID-19 Booster Mammogram   Star-Rating Drugs: Farxiga 10 mg tablet  Lisinopril 2.5 mg Atorvastatin 10 mg tablet   Patient's preferred pharmacy is:  [id number] [place], [place] - [id number] [id number]: [phone]: [phone] Uses pill box? Yes Pt endorses 95% compliance  We discussed: Benefits of medication synchronization, packaging and delivery as well as enhanced pharmacist oversight with [employer]. Patient decided to: Continue current medication management strategy  Care Plan and Follow Up Patient Decision:  Patient agrees to Care Plan and Follow-up.  Plan: The patient has been provided with contact information for the care management team and has been advised to call with any health related questions or concerns.   VOrlando Penner PharmD Clinical Pharmacist [facility] [phone]

## 2021-09-11 ENCOUNTER — Encounter: Payer: Self-pay | Admitting: Nurse Practitioner

## 2021-09-11 ENCOUNTER — Other Ambulatory Visit: Payer: Self-pay

## 2021-09-11 ENCOUNTER — Ambulatory Visit (INDEPENDENT_AMBULATORY_CARE_PROVIDER_SITE_OTHER): Payer: Medicare Other | Admitting: Nurse Practitioner

## 2021-09-11 VITALS — BP 116/78 | HR 71 | Temp 98.5°F | Ht 64.0 in | Wt 156.8 lb

## 2021-09-11 DIAGNOSIS — I129 Hypertensive chronic kidney disease with stage 1 through stage 4 chronic kidney disease, or unspecified chronic kidney disease: Secondary | ICD-10-CM

## 2021-09-11 DIAGNOSIS — E1122 Type 2 diabetes mellitus with diabetic chronic kidney disease: Secondary | ICD-10-CM | POA: Diagnosis not present

## 2021-09-11 DIAGNOSIS — N1831 Chronic kidney disease, stage 3a: Secondary | ICD-10-CM | POA: Diagnosis not present

## 2021-09-11 DIAGNOSIS — I10 Essential (primary) hypertension: Secondary | ICD-10-CM

## 2021-09-11 DIAGNOSIS — D573 Sickle-cell trait: Secondary | ICD-10-CM

## 2021-09-11 NOTE — Progress Notes (Signed)
I,Katawbba Wiggins,acting as a Neurosurgeon for [employer], FNP.,have documented all relevant documentation on the behalf of [name], FNP,as directed by  [name], FNP while in the presence of [name], FNP.   This visit occurred during the SARS-CoV-2 public health emergency.  Safety protocols were in place, including screening questions prior to the visit, additional usage of staff PPE, and extensive cleaning of exam room while observing appropriate contact time as indicated for disinfecting solutions.  Subjective:     Patient ID: Catherine [name] , female    DOB: Mar 27, 1946 , 75 y.o.   MRN: [id number]   Chief Complaint  Patient presents with   Diabetes    HPI  She is here due to having low blood pressures. She is not having any symptoms or concerns. She is not taking the 3 mg trulicity. She has been changing her diet and she is moving more.     Past Medical History:  Diagnosis Date   CAP (community acquired pneumonia)--treated 08/23/2014   Diabetes mellitus without complication (HCC)    DKA (diabetic ketoacidoses) 08/13/2014   Hypertension    Pneumonia 08/13/2014   Thyroid disease      Family History  Problem Relation Age of Onset   Diabetes Mellitus II Mother    Stroke Mother    Diabetes Mellitus II Sister    Hypertension Sister      Current Outpatient Medications:    Accu-Chek FastClix Lancets MISC, AS DIRECTED TWICE A DAY SUBCUTANEOUS 90 DAYS E11.9, Disp: 204 each, Rfl: 4   Ascorbic Acid (VITAMIN C) 1000 MG tablet, Take 1,000 mg by mouth daily., Disp: , Rfl:    aspirin 81 MG tablet, Take 81 mg by mouth daily., Disp: , Rfl:    atorvastatin (LIPITOR) 10 MG tablet, TAKE 1 TABLET BY MOUTH EVERY DAY, Disp: 90 tablet, Rfl: 0   beta carotene w/minerals (OCUVITE) tablet, Take 1 tablet by mouth daily., Disp: , Rfl:    calcium citrate-vitamin D (CITRACAL+D) 315-200 MG-UNIT tablet, Take 1 tablet by mouth 2 (two) times daily., Disp: , Rfl:    Cyanocobalamin (VITAMIN B 12  PO), Take 1,000 mg by mouth daily., Disp: , Rfl:    dapagliflozin propanediol (FARXIGA) 10 MG TABS tablet, Take 1 tablet (10 mg total) by mouth daily., Disp: 30 tablet, Rfl: 5   Dulaglutide (TRULICITY) 1.5 MG/0.5ML SOPN, Inject into the skin once a week., Disp: , Rfl:    ferrous sulfate 325 (65 FE) MG tablet, Take 1 tablet (325 mg total) by mouth 2 (two) times daily., Disp: 180 tablet, Rfl: 1   glucose blood (ACCU-CHEK GUIDE) test strip, Use to check blood sugars twice daily E11.69, Disp: 100 each, Rfl: 2   hydrocortisone (ANUSOL-HC) 2.5 % rectal cream, PLACE 1 APPLICATION RECTALLY 2 (TWO) TIMES DAILY., Disp: 30 g, Rfl: 2   lisinopril (ZESTRIL) 2.5 MG tablet, Take 1 tablet (2.5 mg total) by mouth daily., Disp: 90 tablet, Rfl: 1   Multiple Vitamin (MULTIVITAMIN WITH MINERALS) TABS tablet, Take 1 tablet by mouth daily., Disp: , Rfl:    mupirocin ointment (BACTROBAN) 2 %, APPLY TO AFFECTED AREA TWICE A DAY, Disp: 22 g, Rfl: 0   Omega-3 Fatty Acids (FISH OIL) 1000 MG CAPS, Take 1 capsule by mouth daily. , Disp: , Rfl:    Wheat Dextrin (BENEFIBER DRINK MIX PO), Take by mouth., Disp: , Rfl:    Dulaglutide (TRULICITY) 3 MG/0.5ML SOPN, Inject 3 mg as directed once a week. (Patient not taking: Reported on 09/11/2021),  Disp: 12 mL, Rfl: 1   levothyroxine (SYNTHROID) 112 MCG tablet, TAKE 1 TABLET BY MOUTH EVERY DAY BEFORE BREAKFAST, Disp: 90 tablet, Rfl: 0   No Known Allergies   Review of Systems  Constitutional:  Negative for fatigue.  Respiratory: Negative.  Negative for cough and wheezing.   Cardiovascular:  Negative for chest pain, palpitations and leg swelling.  Endocrine: Negative for polydipsia, polyphagia and polyuria.  Neurological:  Negative for dizziness and headaches.  Psychiatric/Behavioral:  Negative for confusion. The patient is not nervous/anxious.     Today's Vitals   09/11/21 1527  BP: 116/78  Pulse: 71  Temp: 98.5 F (36.9 C)  Weight: 156 lb 12.8 oz (71.1 kg)  Height: 5\' 4"   (1.626 m)  PainSc: 0-No pain   Body mass index is 26.91 kg/m.  Wt Readings from Last 3 Encounters:  09/11/21 156 lb 12.8 oz (71.1 kg)  08/12/21 161 lb 3.2 oz (73.1 kg)  04/29/21 159 lb 9.6 oz (72.4 kg)    BP Readings from Last 3 Encounters:  09/11/21 116/78  04/29/21 114/70  01/23/21 138/60    Objective:  Physical Exam Vitals reviewed.  Constitutional:      General: She is not in acute distress.    Appearance: Normal appearance. She is normal weight.  Cardiovascular:     Rate and Rhythm: Normal rate and regular rhythm.     Pulses: Normal pulses.     Heart sounds: Normal heart sounds. No murmur heard. Pulmonary:     Effort: Pulmonary effort is normal. No respiratory distress.     Breath sounds: Normal breath sounds. No wheezing.  Skin:    General: Skin is warm and dry.     Capillary Refill: Capillary refill takes less than 2 seconds.  Neurological:     General: No focal deficit present.     Mental Status: She is alert and oriented to person, place, and time.     Cranial Nerves: No cranial nerve deficit.     Motor: No weakness.  Psychiatric:        Mood and Affect: Mood normal.        Behavior: Behavior normal.        Thought Content: Thought content normal.        Judgment: Judgment normal.        Assessment And Plan:     1. Type 2 diabetes mellitus with stage 3a chronic kidney disease, without long-term current use of insulin (HCC)  2. Essential hypertension Comments: Blood pressure had been lower but will have her to increase her water intake.      Patient was given opportunity to ask questions. Patient verbalized understanding of the plan and was able to repeat key elements of the plan. All questions were answered to their satisfaction.  03/25/21, FNP   I, [name], FNP, have reviewed all documentation for this visit. The documentation on 10/13/21 for the exam, diagnosis, procedures, and orders are all accurate and complete.   IF YOU HAVE BEEN  REFERRED TO A SPECIALIST, IT MAY TAKE 1-2 WEEKS TO SCHEDULE/PROCESS THE REFERRAL. IF YOU HAVE NOT HEARD FROM US/SPECIALIST IN TWO WEEKS, PLEASE GIVE 10/15/21 A CALL AT [phone].   THE PATIENT IS ENCOURAGED TO PRACTICE SOCIAL DISTANCING DUE TO THE COVID-19 PANDEMIC.

## 2021-09-11 NOTE — Patient Instructions (Addendum)
Diabetes Mellitus and Foot Care Foot care is an important part of your health, especially when you have diabetes. Diabetes may cause you to have problems because of poor blood flow (circulation) to your feet and legs, which can cause your skin to: Become thinner and drier. Break more easily. Heal more slowly. Peel and crack. You may also have nerve damage (neuropathy) in your legs and feet, causing decreased feeling in them. This means that you may not notice minor injuries to your feet that could lead to more serious problems. Noticing and addressing any potential problems early is the best way to prevent future foot problems. How to care for your feet Foot hygiene  Wash your feet daily with warm water and mild soap. Do not use hot water. Then, pat your feet and the areas between your toes until they are completely dry. Do not soak your feet as this can dry your skin. Trim your toenails straight across. Do not dig under them or around the cuticle. File the edges of your nails with an emery board or nail file. Apply a moisturizing lotion or petroleum jelly to the skin on your feet and to dry, brittle toenails. Use lotion that does not contain alcohol and is unscented. Do not apply lotion between your toes. Shoes and socks Wear clean socks or stockings every day. Make sure they are not too tight. Do not wear knee-high stockings since they may decrease blood flow to your legs. Wear shoes that fit properly and have enough cushioning. Always look in your shoes before you put them on to be sure there are no objects inside. To break in new shoes, wear them for just a few hours a day. This prevents injuries on your feet. Wounds, scrapes, corns, and calluses  Check your feet daily for blisters, cuts, bruises, sores, and redness. If you cannot see the bottom of your feet, use a mirror or ask someone for help. Do not cut corns or calluses or try to remove them with medicine. If you find a minor scrape,  cut, or break in the skin on your feet, keep it and the skin around it clean and dry. You may clean these areas with mild soap and water. Do not clean the area with peroxide, alcohol, or iodine. If you have a wound, scrape, corn, or callus on your foot, look at it several times a day to make sure it is healing and not infected. Check for: Redness, swelling, or pain. Fluid or blood. Warmth. Pus or a bad smell. General tips Do not cross your legs. This may decrease blood flow to your feet. Do not use heating pads or hot water bottles on your feet. They may burn your skin. If you have lost feeling in your feet or legs, you may not know this is happening until it is too late. Protect your feet from hot and cold by wearing shoes, such as at the beach or on hot pavement. Schedule a complete foot exam at least once a year (annually) or more often if you have foot problems. Report any cuts, sores, or bruises to your health care provider immediately. Where to find more information American Diabetes Association: www.diabetes.org Association of Diabetes Care & Education Specialists: www.diabeteseducator.org Contact a health care provider if: You have a medical condition that increases your risk of infection and you have any cuts, sores, or bruises on your feet. You have an injury that is not healing. You have redness on your legs or feet. You   feel burning or tingling in your legs or feet. You have pain or cramps in your legs and feet. Your legs or feet are numb. Your feet always feel cold. You have pain around any toenails. Get help right away if: You have a wound, scrape, corn, or callus on your foot and: You have pain, swelling, or redness that gets worse. You have fluid or blood coming from the wound, scrape, corn, or callus. Your wound, scrape, corn, or callus feels warm to the touch. You have pus or a bad smell coming from the wound, scrape, corn, or callus. You have a fever. You have a red  line going up your leg. Summary Check your feet every day for blisters, cuts, bruises, sores, and redness. Apply a moisturizing lotion or petroleum jelly to the skin on your feet and to dry, brittle toenails. Wear shoes that fit properly and have enough cushioning. If you have foot problems, report any cuts, sores, or bruises to your health care provider immediately. Schedule a complete foot exam at least once a year (annually) or more often if you have foot problems. This information is not intended to replace advice given to you by your health care provider. Make sure you discuss any questions you have with your health care provider. Document Revised: 05/23/2020 Document Reviewed: 05/23/2020 Elsevier Patient Education  2022 [employer]   You need to increase your Trulicity 1.5 mg to take 3 mg - you will take 2 of the 1.5 mg doses once a week (Tuesday) until we can get your dose changed with the patient assistance

## 2021-09-11 NOTE — Patient Instructions (Signed)
Visit Information It was great speaking with you today!  Please let me know if you have any questions about our visit.   Goals Addressed             This Visit's Progress    Manage My Medicine       Timeframe:  Long-Range Goal Priority:  High Start Date:                             Expected End Date:                       Follow Up Date 11/12/2021   - call for medicine refill 2 or 3 days before it runs out - call if I am sick and can't take my medicine - keep a list of all the medicines I take; vitamins and herbals too - use a pillbox to sort medicine    Why is this important?   These steps will help you keep on track with your medicines.        Patient Care Plan: CCM Pharmacy Care Plan     Problem Identified: HLD, DM II   Priority: High     Long-Range Goal: Disease Management   Recent Progress: On track  Priority: High  Note:   Current Barriers:  Unable to independently monitor therapeutic efficacy  Pharmacist Clinical Goal(s):  Patient will achieve adherence to monitoring guidelines and medication adherence to achieve therapeutic efficacy through collaboration with PharmD and provider.   Interventions: 1:1 collaboration with [name], FNP regarding development and update of comprehensive plan of care as evidenced by provider attestation and co-signature Inter-disciplinary care team collaboration (see longitudinal plan of care) Comprehensive medication review performed; medication list updated in electronic medical record  Hyperlipidemia: (LDL goal < 70) -Controlled -Current treatment: Atorvastatin 10 mg tablet once daily  -Current dietary patterns: she is cutting back on her fried and fatty foods  -Current exercise habits: she is walking everyday  -Educated on Benefits of statin for ASCVD risk reduction; Importance of limiting foods high in cholesterol; -Counseled on diet and exercise extensively Recommended to continue current  medication   Diabetes (A1c goal <7%) -Uncontrolled -Current medications: Trulicity 3 mg - inject once per week  Trulicity 1.5 mg - inject once per week  Patients dose should be 3 mg send a new prescription to patient assistance  Farxiga 10 mg tablet once per day  -Current home glucose readings fasting glucose: 139, 159, 138, 143,151,133, 129, 150, 130, 130,149 post prandial glucose: 150 -Denies hypoglycemic/hyperglycemic symptoms -Current meal patterns:  breakfast: oatmeal and toast, fruit   lunch: mid day apple   drinks: she is drinking more water -Current exercise: she has increased her activity  -Educated on Complications of diabetes including kidney damage, retinal damage, and cardiovascular disease; Counseled to check feet daily and get yearly eye exams -Patient assistance to send a Trulicity 3 mg in new shipment. Patient made aware and is agreeable to this plan.  -Counseled on diet and exercise extensively  Patient Goals/Self-Care Activities Patient will:  - take medications as prescribed  Follow Up Plan: The patient has been provided with contact information for the care management team and has been advised to call with any health related questions or concerns.          Patient agreed to services and verbal consent obtained.   The patient verbalized understanding of instructions, educational materials, and  care plan provided today and agreed to receive a mailed copy of patient instructions, educational materials, and care plan.   Catherine [name], PharmD Clinical Pharmacist [facility] [phone]

## 2021-09-15 DIAGNOSIS — E782 Mixed hyperlipidemia: Secondary | ICD-10-CM | POA: Diagnosis not present

## 2021-09-15 DIAGNOSIS — E1122 Type 2 diabetes mellitus with diabetic chronic kidney disease: Secondary | ICD-10-CM

## 2021-09-15 DIAGNOSIS — N1831 Chronic kidney disease, stage 3a: Secondary | ICD-10-CM | POA: Diagnosis not present

## 2021-09-16 ENCOUNTER — Other Ambulatory Visit: Payer: Self-pay | Admitting: Nurse Practitioner

## 2021-09-16 DIAGNOSIS — E039 Hypothyroidism, unspecified: Secondary | ICD-10-CM

## 2021-09-24 ENCOUNTER — Encounter: Payer: Self-pay | Admitting: Nurse Practitioner

## 2021-10-01 ENCOUNTER — Telehealth: Payer: Self-pay

## 2021-10-01 NOTE — Chronic Care Management (AMB) (Signed)
    Chronic Care Management Pharmacy Assistant   Name: Catherine [name]  MRN: [id number] DOB: Oct 24, 1946  Reason for Encounter: 2023 PAP   Medications: Outpatient Encounter Medications as of 10/01/2021  Medication Sig Note   Accu-Chek FastClix Lancets MISC AS DIRECTED TWICE A DAY SUBCUTANEOUS 90 DAYS E11.9    Ascorbic Acid (VITAMIN C) 1000 MG tablet Take 1,000 mg by mouth daily.    aspirin 81 MG tablet Take 81 mg by mouth daily.    atorvastatin (LIPITOR) 10 MG tablet TAKE 1 TABLET BY MOUTH EVERY DAY    beta carotene w/minerals (OCUVITE) tablet Take 1 tablet by mouth daily. 08/17/2018: Uses preservision   calcium citrate-vitamin D (CITRACAL+D) 315-200 MG-UNIT tablet Take 1 tablet by mouth 2 (two) times daily.    Cyanocobalamin (VITAMIN B 12 PO) Take 1,000 mg by mouth daily.    dapagliflozin propanediol (FARXIGA) 10 MG TABS tablet Take 1 tablet (10 mg total) by mouth daily.    Dulaglutide (TRULICITY) 1.5 MG/0.5ML SOPN Inject into the skin once a week.    Dulaglutide (TRULICITY) 3 MG/0.5ML SOPN Inject 3 mg as directed once a week. (Patient not taking: Reported on 09/11/2021)    ferrous sulfate 325 (65 FE) MG tablet Take 1 tablet (325 mg total) by mouth 2 (two) times daily.    glucose blood (ACCU-CHEK GUIDE) test strip Use to check blood sugars twice daily E11.69    hydrocortisone (ANUSOL-HC) 2.5 % rectal cream PLACE 1 APPLICATION RECTALLY 2 (TWO) TIMES DAILY.    levothyroxine (SYNTHROID) 112 MCG tablet TAKE 1 TABLET BY MOUTH EVERY DAY BEFORE BREAKFAST    lisinopril (ZESTRIL) 2.5 MG tablet Take 1 tablet (2.5 mg total) by mouth daily.    Multiple Vitamin (MULTIVITAMIN WITH MINERALS) TABS tablet Take 1 tablet by mouth daily.    mupirocin ointment (BACTROBAN) 2 % APPLY TO AFFECTED AREA TWICE A DAY    Omega-3 Fatty Acids (FISH OIL) 1000 MG CAPS Take 1 capsule by mouth daily.     Wheat Dextrin (BENEFIBER DRINK MIX PO) Take by mouth.    No facility-administered encounter medications on file as of  10/01/2021.   10-01-2021: Initiated 2023 application for trulicity. Patient has recently increased to 3 mg and is taking 2 1.5 mg until the 3 mg arrives. Patient counted the amount left and has enough supply to the end of the year. Informed patient that application will be mailed to sign and return to the office with income.  [name] Clinical Pharmacist Assistant [phone]

## 2021-10-06 ENCOUNTER — Telehealth: Payer: Self-pay

## 2021-10-06 NOTE — Chronic Care Management (AMB) (Signed)
Chronic Care Management Pharmacy Assistant   Name: Catherine [name]  MRN: [id number] DOB: Mar 26, 1946  Reason for Encounter: Disease State/ Diabetes  Recent office visits:  09-11-2021 [name], FNP. INCREASE trulicity 1.5 mg weekly TO 3 mg weekly.  Recent consult visits:  None  Hospital visits:  None in previous 6 months  Medications: Outpatient Encounter Medications as of 10/06/2021  Medication Sig Note   Accu-Chek FastClix Lancets MISC AS DIRECTED TWICE A DAY SUBCUTANEOUS 90 DAYS E11.9    Ascorbic Acid (VITAMIN C) 1000 MG tablet Take 1,000 mg by mouth daily.    aspirin 81 MG tablet Take 81 mg by mouth daily.    atorvastatin (LIPITOR) 10 MG tablet TAKE 1 TABLET BY MOUTH EVERY DAY    beta carotene w/minerals (OCUVITE) tablet Take 1 tablet by mouth daily. 08/17/2018: Uses preservision   calcium citrate-vitamin D (CITRACAL+D) 315-200 MG-UNIT tablet Take 1 tablet by mouth 2 (two) times daily.    Cyanocobalamin (VITAMIN B 12 PO) Take 1,000 mg by mouth daily.    dapagliflozin propanediol (FARXIGA) 10 MG TABS tablet Take 1 tablet (10 mg total) by mouth daily.    Dulaglutide (TRULICITY) 1.5 MG/0.5ML SOPN Inject into the skin once a week.    Dulaglutide (TRULICITY) 3 MG/0.5ML SOPN Inject 3 mg as directed once a week. (Patient not taking: Reported on 09/11/2021)    ferrous sulfate 325 (65 FE) MG tablet Take 1 tablet (325 mg total) by mouth 2 (two) times daily.    glucose blood (ACCU-CHEK GUIDE) test strip Use to check blood sugars twice daily E11.69    hydrocortisone (ANUSOL-HC) 2.5 % rectal cream PLACE 1 APPLICATION RECTALLY 2 (TWO) TIMES DAILY.    levothyroxine (SYNTHROID) 112 MCG tablet TAKE 1 TABLET BY MOUTH EVERY DAY BEFORE BREAKFAST    lisinopril (ZESTRIL) 2.5 MG tablet Take 1 tablet (2.5 mg total) by mouth daily.    Multiple Vitamin (MULTIVITAMIN WITH MINERALS) TABS tablet Take 1 tablet by mouth daily.    mupirocin ointment (BACTROBAN) 2 % APPLY TO AFFECTED AREA TWICE A DAY     Omega-3 Fatty Acids (FISH OIL) 1000 MG CAPS Take 1 capsule by mouth daily.     Wheat Dextrin (BENEFIBER DRINK MIX PO) Take by mouth.    No facility-administered encounter medications on file as of 10/06/2021.  Recent Relevant Labs: Lab Results  Component Value Date/Time   HGBA1C 8.2 (H) 08/12/2021 12:25 PM   HGBA1C 8.1 (H) 04/29/2021 12:26 PM   MICROALBUR 10 08/12/2021 12:52 PM   MICROALBUR 10 08/06/2020 01:40 PM    Kidney Function Lab Results  Component Value Date/Time   CREATININE 1.18 (H) 08/12/2021 12:25 PM   CREATININE 1.42 (H) 04/29/2021 12:26 PM   CREATININE 1.79 (H) 12/21/2017 11:26 AM   GFRNONAA 42 (L) 11/05/2020 12:51 PM   GFRNONAA 28 (L) 12/21/2017 11:26 AM   GFRAA 48 (L) 11/05/2020 12:51 PM   GFRAA 32 (L) 12/21/2017 11:26 AM    Current antihyperglycemic regimen:  Trulicity 3 mg weekly Farxiga 10 mg daily  What recent interventions/DTPs have been made to improve glycemic control:  Educated on Complications of diabetes including kidney damage, retinal damage, and cardiovascular disease; Counseled to check feet daily and get yearly eye exams -Counseled on diet and exercise extensively  Have there been any recent hospitalizations or ED visits since last visit with CPP? No  Patient denies hypoglycemic symptoms  Patient denies hyperglycemic symptoms  How often are you checking your blood sugar? twice daily  What  are your blood sugars ranging?  Fasting: 125, 139,118 Before meals: None After meals: None Bedtime: 140, 146, 140  During the week, how often does your blood glucose drop below 70? Never  Are you checking your feet daily/regularly? Patient stated daily  Adherence Review: Is the patient currently on a STATIN medication? Yes Is the patient currently on ACE/ARB medication? Yes Does the patient have >5 day gap between last estimated fill dates? No  NOTES: Patient stated blood sugar readings have been better since the dose increase on  Trulicity.  Care Gaps: Yearly mammogram overdue AWV 02-12-2022  Star Rating Drugs: Trulicity 3 mg- Patient assistance Atorvastatin 10 mg- Last filled 08-18-2021 [facility] Farxiga 10 mg- Last filled 09-26-2021 [facility] Lisinopril 2.5 mg- Last filled 08-24-2021 [facility]  [facility] CMA Clinical Pharmacist Assistant [phone]

## 2021-10-13 DIAGNOSIS — D573 Sickle-cell trait: Secondary | ICD-10-CM | POA: Insufficient documentation

## 2021-10-16 DIAGNOSIS — M8589 Other specified disorders of bone density and structure, multiple sites: Secondary | ICD-10-CM | POA: Diagnosis not present

## 2021-10-16 DIAGNOSIS — Z1231 Encounter for screening mammogram for malignant neoplasm of breast: Secondary | ICD-10-CM | POA: Diagnosis not present

## 2021-10-16 LAB — HM DEXA SCAN

## 2021-10-20 ENCOUNTER — Encounter: Payer: Self-pay | Admitting: Nurse Practitioner

## 2021-10-28 ENCOUNTER — Other Ambulatory Visit: Payer: Self-pay | Admitting: Nurse Practitioner

## 2021-10-28 DIAGNOSIS — N1831 Chronic kidney disease, stage 3a: Secondary | ICD-10-CM

## 2021-10-29 ENCOUNTER — Encounter: Payer: Self-pay | Admitting: Nurse Practitioner

## 2021-10-29 LAB — HM MAMMOGRAPHY

## 2021-11-05 ENCOUNTER — Encounter: Payer: Self-pay | Admitting: Nurse Practitioner

## 2021-11-11 ENCOUNTER — Telehealth: Payer: Self-pay

## 2021-11-11 NOTE — Progress Notes (Signed)
° ° °  Chronic Care Management Pharmacy Assistant   Name: Catherine [name]  MRN: [id number] DOB: [birth year]  Reason for Encounter: Reminder  call for appointment with CPP on 11/12/2021 at 12:00pm.   Patient confirmed appointment.         Doreen Beam Clinical Pharmacist Assistant [phone]

## 2021-11-12 ENCOUNTER — Telehealth: Payer: Medicare Other

## 2021-11-12 NOTE — Progress Notes (Deleted)
Current Barriers:  {pharmacybarriers:24917}  Pharmacist Clinical Goal(s):  Patient will {PHARMACYGOALCHOICES:24921} through collaboration with PharmD and provider.   Interventions: 1:1 collaboration with [name], FNP regarding development and update of comprehensive plan of care as evidenced by provider attestation and co-signature Inter-disciplinary care team collaboration (see longitudinal plan of care) Comprehensive medication review performed; medication list updated in electronic medical record  {CCM [facility] DISEASE STATES:25130}  Patient Goals/Self-Care Activities Patient will:  - {pharmacypatientgoals:24919}  Follow Up Plan: {CM FOLLOW UP IRWE:31540}  Chronic Care Management Pharmacy Note  11/12/2021 Name:  Catherine [name] MRN:  [id number] DOB:  [birth year]  Summary: ***  Recommendations/Changes made from today's visit: ***  Plan: ***   Subjective: Catherine [name] is an 75 y.o. year old female who is a primary patient of [name], [place].  The CCM team was consulted for assistance with disease management and care coordination needs.    {CCMTELEPHONEFACETOFACE:21091510} for {CCMINITIALFOLLOWUPCHOICE:21091511} in response to provider referral for pharmacy case management and/or care coordination services.   Consent to Services:  {CCMCONSENTOPTIONS:25074}  Patient Care Team: [name], FNP as PCP - General ([place]) Brunetta Genera, MD as Consulting Physician (Hematology) Mayford Knife, [facility] (Pharmacist)  Recent office visits: ***  Recent consult visits: [facility] visits: {Hospital DC Yes/No:25215}   Objective:  Lab Results  Component Value Date   CREATININE 1.18 (H) 08/12/2021   BUN 21 08/12/2021   GFRNONAA 42 (L) 11/05/2020   GFRAA 48 (L) 11/05/2020   NA 144 08/12/2021   K 4.7 08/12/2021   CALCIUM 10.0 08/12/2021   CO2 23 08/12/2021   GLUCOSE 133 (H) 08/12/2021    Lab Results  Component Value Date/Time    HGBA1C 8.2 (H) 08/12/2021 12:25 PM   HGBA1C 8.1 (H) 04/29/2021 12:26 PM   MICROALBUR 10 08/12/2021 12:52 PM   MICROALBUR 10 08/06/2020 01:40 PM    Last diabetic Eye exam:  Lab Results  Component Value Date/Time   HMDIABEYEEXA No Retinopathy 11/13/2020 12:00 AM    Last diabetic Foot exam: No results found for: HMDIABFOOTEX   Lab Results  Component Value Date   CHOL 151 08/12/2021   HDL 54 08/12/2021   LDLCALC 78 08/12/2021   TRIG 101 08/12/2021   CHOLHDL 2.8 08/12/2021    Hepatic Function Latest Ref Rng & Units 08/12/2021 04/29/2021 01/23/2021  Total Protein 6.0 - 8.5 g/dL 7.0 7.1 6.8  Albumin 3.7 - 4.7 g/dL 5.2(H) 4.5 4.6  AST 0 - 40 IU/L _0 ALT 0 - 32 IU/L _1 Alk Phosphatase 44 - 121 IU/L 87 93 89  Total Bilirubin 0.0 - 1.2 mg/dL 0.5 0.6 0.6    Lab Results  Component Value Date/Time   TSH 0.694 04/29/2021 12:26 PM   TSH 1.500 11/05/2020 12:51 PM   FREET4 1.42 10/05/2019 08:55 AM   FREET4 1.50 05/10/2019 10:37 AM    CBC Latest Ref Rng & Units 08/12/2021 01/23/2021 08/06/2020  WBC 3.4 - 10.8 x10E3/uL 3.7 3.6 4.4  Hemoglobin 11.1 - 15.9 g/dL 12.5 11.0(L) 11.4  Hematocrit 34.0 - 46.6 % 37.4 32.3(L) 33.7(L)  Platelets 150 - 450 x10E3/uL 205 211 215    Lab Results  Component Value Date/Time   VD25OH 34.5 01/23/2021 11:54 AM   VD25OH 33.6 11/05/2020 12:51 PM    Clinical ASCVD: No  The 10-year ASCVD risk score (Arnett DK, et al., 2019) is: 38.8%   Values used to calculate the score:     Age: 75 years  Sex: Female     Is Non-Hispanic African American: Yes     Diabetic: Yes     Tobacco smoker: Yes     Systolic Blood Pressure: 161 mmHg     Is BP treated: Yes     HDL Cholesterol: 54 mg/dL     Total Cholesterol: 151 mg/dL    Depression screen [facility] 2/9 01/23/2021 12/10/2020 01/18/2020  Decreased Interest 0 1 0  Down, Depressed, Hopeless 0 1 0  PHQ - 2 Score 0 2 0  Altered sleeping - 1 0  Tired, decreased energy - 1 0  Change in appetite - 1 0  Feeling  bad or failure about yourself  - 1 0  Trouble concentrating - 1 0  Moving slowly or fidgety/restless - 0 0  Suicidal thoughts - 0 0  PHQ-9 Score - 7 0  Difficult doing work/chores - - Not difficult at all      Social History   Tobacco Use  Smoking Status Never  Smokeless Tobacco Never   BP Readings from Last 3 Encounters:  09/11/21 116/78  04/29/21 114/70  01/23/21 138/60   Pulse Readings from Last 3 Encounters:  09/11/21 71  04/29/21 79  01/23/21 85   Wt Readings from Last 3 Encounters:  09/11/21 156 lb 12.8 oz (71.1 kg)  08/12/21 161 lb 3.2 oz (73.1 kg)  04/29/21 159 lb 9.6 oz (72.4 kg)   BMI Readings from Last 3 Encounters:  09/11/21 26.91 kg/m  08/12/21 27.67 kg/m  04/29/21 26.89 kg/m    Assessment/Interventions: Review of patient past medical history, allergies, medications, health status, including review of consultants reports, laboratory and other test data, was performed as part of comprehensive evaluation and provision of chronic care management services.   SDOH:  (Social Determinants of Health) assessments and interventions performed: {yes/no:20286}  SDOH Screenings   Alcohol Screen: Not on file  Depression (PHQ2-9): Low Risk    PHQ-2 Score: 0  Financial Resource Strain: Low Risk    Difficulty of Paying Living Expenses: Not hard at all  Food Insecurity: No Food Insecurity   Worried About Charity fundraiser in the Last Year: Never true   Ran Out of Food in the Last Year: Never true  Housing: Not on file  Physical Activity: Inactive   Days of Exercise per Week: 0 days   Minutes of Exercise per Session: 0 min  Social Connections: Not on file  Stress: No Stress Concern Present   Feeling of Stress : Not at all  Tobacco Use: Low Risk    Smoking Tobacco Use: Never   Smokeless Tobacco Use: Never   Passive Exposure: Not on file  Transportation Needs: No Transportation Needs   Lack of Transportation (Medical): No   Lack of Transportation  (Non-Medical): No    CCM Care Plan  No Known Allergies  Medications Reviewed Today     Reviewed by Michelle Nasuti, [place] (Certified Medical Assistant) on 09/11/21 at 1526  Med List Status: <None>   Medication Order Taking? Sig Documenting Provider Last Dose Status Informant  Accu-Chek FastClix Lancets MISC [id number] Yes AS DIRECTED TWICE A DAY SUBCUTANEOUS 90 DAYS E11.9 [name], FNP Taking Active   Ascorbic Acid (VITAMIN C) 1000 MG tablet [id number] Yes Take 1,000 mg by mouth daily. [provider] Taking Active   aspirin 81 MG tablet [id number] Yes Take 81 mg by mouth daily. [provider] Taking Active Multiple Informants  atorvastatin (LIPITOR) 10 MG tablet [id number] Yes TAKE 1 TABLET BY  MOUTH EVERY DAY [name], FNP Taking Active   beta carotene w/minerals (OCUVITE) tablet [id number] Yes Take 1 tablet by mouth daily. [provider] Taking Active            Med Note Kellie Simmering   Wed Aug 17, 2018  9:06 AM) Uses preservision  calcium citrate-vitamin D (CITRACAL+D) 315-200 MG-UNIT tablet [id number] Yes Take 1 tablet by mouth 2 (two) times daily. [provider] Taking Active   Cyanocobalamin (VITAMIN B 12 PO) [id number] Yes Take 1,000 mg by mouth daily. [provider] Taking Active Multiple Informants  dapagliflozin propanediol (FARXIGA) 10 MG TABS tablet [id number] Yes Take 1 tablet (10 mg total) by mouth daily. [name], FNP Taking Active   Dulaglutide (TRULICITY) 3 ZS/0.1UX [place] [facility] [id number] Yes Inject 3 mg as directed once a week. [name], FNP Taking Active   ferrous sulfate 325 (65 FE) MG tablet [id number] Yes Take 1 tablet (325 mg total) by mouth 2 (two) times daily. [name], FNP Taking Active   glucose blood (ACCU-CHEK GUIDE) test strip [id number] Yes Use to check blood sugars twice daily E11.69 [name], FNP Taking Active   hydrocortisone (ANUSOL-HC) 2.5 % rectal cream [id number] Yes PLACE 1 APPLICATION  RECTALLY 2 (TWO) TIMES DAILY. [name], FNP Taking Active   levothyroxine (SYNTHROID) 112 MCG tablet [id number] Yes TAKE 1 TABLET BY MOUTH EVERY DAY BEFORE Evelena Peat, FNP Taking Active   lisinopril (ZESTRIL) 2.5 MG tablet [id number] Yes Take 1 tablet (2.5 mg total) by mouth daily. [name], FNP Taking Active   Multiple Vitamin (MULTIVITAMIN WITH MINERALS) TABS tablet [id number] Yes Take 1 tablet by mouth daily. [provider] Taking Active   mupirocin ointment (BACTROBAN) 2 % [id number] Yes APPLY TO AFFECTED AREA TWICE A DAY [name], FNP Taking Active   Omega-3 Fatty Acids (FISH OIL) 1000 MG CAPS [id number] Yes Take 1 capsule by mouth daily.  [provider] Taking Active Self  Wheat Dextrin (BENEFIBER DRINK MIX PO) [id number] Yes Take by mouth. 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[place]) 08/03/2018   Anemia 08/23/2014   Hypokalemia 08/23/2014   DKA, type 2 ([place]) 08/13/2014   Hypernatremia 08/13/2014   Elevated LFTs 08/13/2014   Acquired hypothyroidism 08/13/2014    Immunization History  Administered Date(s) Administered   Fluad Quad(high Dose 65+) 08/11/2019, 09/13/2020, 08/12/2021   Influenza, High Dose Seasonal PF 08/08/2018   Influenza,inj,Quad PF,6+ Mos 08/16/2014   Influenza-Unspecified 08/08/2018, 08/11/2019   PFIZER Comirnaty(Gray Top)Covid-19 Tri-[place] Vaccine 06/27/2021   PFIZER(Purple Top)SARS-COV-2 Vaccination 12/21/2019, 01/11/2020, 10/23/2020, 06/27/2021, 09/18/2021   PNEUMOCOCCAL CONJUGATE-20 09/18/2021   Pneumococcal Conjugate-13 10/02/2020   Pneumococcal Polysaccharide-23 08/24/2019   Tdap 09/18/2019    Conditions to be addressed/monitored:   {USCCMDZASSESSMENTOPTIONS:23563}  There are no care plans that you recently modified to display for this patient.    Medication Assistance: {MEDASSISTANCEINFO:25044}  Compliance/Adherence/Medication fill history: Care Gaps: ***  Star-Rating Drugs: ***  Patient's preferred pharmacy is:  [id number] [place] [place] - [id number] [id number]: [phone]: [phone] Uses pill box? {Yes or If no, why not?:20788} Pt endorses ***% compliance  We  discussed: {Pharmacy options:24294} Patient decided to: {US Pharmacy Plan:23885}  Care Plan and Follow Up Patient Decision:  {FOLLOWUP:24991}  Plan: {CM FOLLOW UP PLAN:25073}  ***

## 2021-11-18 ENCOUNTER — Ambulatory Visit (INDEPENDENT_AMBULATORY_CARE_PROVIDER_SITE_OTHER): Payer: Medicare Other | Admitting: Nurse Practitioner

## 2021-11-18 ENCOUNTER — Other Ambulatory Visit: Payer: Self-pay

## 2021-11-18 ENCOUNTER — Other Ambulatory Visit: Payer: Self-pay | Admitting: Nurse Practitioner

## 2021-11-18 ENCOUNTER — Encounter: Payer: Self-pay | Admitting: Nurse Practitioner

## 2021-11-18 VITALS — BP 112/80 | HR 82 | Temp 98.8°F | Ht 64.8 in | Wt 154.0 lb

## 2021-11-18 DIAGNOSIS — Z6825 Body mass index (BMI) 25.0-25.9, adult: Secondary | ICD-10-CM

## 2021-11-18 DIAGNOSIS — Z2821 Immunization not carried out because of patient refusal: Secondary | ICD-10-CM | POA: Diagnosis not present

## 2021-11-18 DIAGNOSIS — E782 Mixed hyperlipidemia: Secondary | ICD-10-CM

## 2021-11-18 DIAGNOSIS — I10 Essential (primary) hypertension: Secondary | ICD-10-CM | POA: Diagnosis not present

## 2021-11-18 DIAGNOSIS — E1122 Type 2 diabetes mellitus with diabetic chronic kidney disease: Secondary | ICD-10-CM

## 2021-11-18 DIAGNOSIS — I129 Hypertensive chronic kidney disease with stage 1 through stage 4 chronic kidney disease, or unspecified chronic kidney disease: Secondary | ICD-10-CM

## 2021-11-18 DIAGNOSIS — N1831 Chronic kidney disease, stage 3a: Secondary | ICD-10-CM

## 2021-11-18 NOTE — Patient Instructions (Signed)

## 2021-11-18 NOTE — Progress Notes (Signed)
I,Yamilka J Llittleton,acting as a Education administrator for [employer], FNP.,have documented all relevant documentation on the behalf of [name], FNP,as directed by  [name], FNP while in the presence of [name], [place].   This visit occurred during the SARS-CoV-2 public health emergency.  Safety protocols were in place, including screening questions prior to the visit, additional usage of staff PPE, and extensive cleaning of exam room while observing appropriate contact time as indicated for disinfecting solutions.  Subjective:     Patient ID: Catherine [name] , female    DOB: May 23, 1946 , 76 y.o.   MRN: [id number]   Chief Complaint  Patient presents with   Diabetes    HPI  Patient presents today for diabetes and bp check. She does not have any concerns at this time. Reports her blood sugar was up this morning because she was "upset" about something from yesterday. She is now taking 3 mg Trulicity. Blood pressure at home has been well controlled.  Wt Readings from Last 3 Encounters: 11/18/21 : 159 lb 3.2 oz (72.2 kg) 09/11/21 : 156 lb 12.8 oz (71.1 kg) 08/12/21 : 161 lb 3.2 oz (73.1 kg)    Diabetes She presents for her follow-up diabetic visit. She has type 2 diabetes mellitus. Pertinent negatives for hypoglycemia include no dizziness or headaches. There are no diabetic associated symptoms. There are no hypoglycemic complications. There are no diabetic complications. Risk factors for coronary artery disease include sedentary lifestyle, hypertension, diabetes mellitus and dyslipidemia. Current diabetic treatment includes oral agent (dual therapy). When asked about meal planning, she reported none. She rarely participates in exercise. (Blood sugar in 150's average) An ACE inhibitor/angiotensin II receptor blocker is being taken. Eye exam is not current (She will schedule an appt).    Past Medical History:  Diagnosis Date   CAP (community acquired pneumonia)--treated 08/23/2014   Diabetes  mellitus without complication ([place])    DKA (diabetic ketoacidoses) 08/13/2014   Hypertension    Pneumonia 08/13/2014   Thyroid disease      Family History  Problem Relation Age of Onset   Diabetes Mellitus II Mother    Stroke Mother    Diabetes Mellitus II Sister    Hypertension Sister      Current Outpatient Medications:    Accu-Chek FastClix Lancets MISC, AS DIRECTED TWICE A DAY SUBCUTANEOUS 90 DAYS E11.9, Disp: 204 each, Rfl: 4   Ascorbic Acid (VITAMIN C) 1000 MG tablet, Take 1,000 mg by mouth daily., Disp: , Rfl:    aspirin 81 MG tablet, Take 81 mg by mouth daily., Disp: , Rfl:    atorvastatin (LIPITOR) 10 MG tablet, TAKE 1 TABLET BY MOUTH EVERY DAY, Disp: 90 tablet, Rfl: 0   beta carotene w/minerals (OCUVITE) tablet, Take 1 tablet by mouth daily., Disp: , Rfl:    calcium citrate-vitamin D (CITRACAL+D) 315-200 MG-UNIT tablet, Take 1 tablet by mouth 2 (two) times daily., Disp: , Rfl:    Cyanocobalamin (VITAMIN B 12 PO), Take 1,000 mg by mouth daily., Disp: , Rfl:    Dulaglutide (TRULICITY) 1.5 KG/8.1EH SOPN, Inject into the skin once a week., Disp: , Rfl:    FARXIGA 10 MG TABS tablet, TAKE 1 TABLET BY MOUTH EVERY DAY, Disp: 30 tablet, Rfl: 5   ferrous sulfate 325 (65 FE) MG tablet, Take 1 tablet (325 mg total) by mouth 2 (two) times daily., Disp: 180 tablet, Rfl: 1   glucose blood (ACCU-CHEK GUIDE) test strip, Use to check blood sugars twice daily E11.69, Disp: 100  each, Rfl: 2   hydrocortisone (ANUSOL-HC) 2.5 % rectal cream, PLACE 1 APPLICATION RECTALLY 2 (TWO) TIMES DAILY., Disp: 30 g, Rfl: 2   levothyroxine (SYNTHROID) 112 MCG tablet, TAKE 1 TABLET BY MOUTH EVERY DAY BEFORE BREAKFAST, Disp: 90 tablet, Rfl: 0   lisinopril (ZESTRIL) 2.5 MG tablet, Take 1 tablet (2.5 mg total) by mouth daily., Disp: 90 tablet, Rfl: 1   Multiple Vitamin (MULTIVITAMIN WITH MINERALS) TABS tablet, Take 1 tablet by mouth daily., Disp: , Rfl:    mupirocin ointment (BACTROBAN) 2 %, APPLY TO AFFECTED AREA  TWICE A DAY, Disp: 22 g, Rfl: 0   Omega-3 Fatty Acids (FISH OIL) 1000 MG CAPS, Take 1 capsule by mouth daily. , Disp: , Rfl:    Wheat Dextrin (BENEFIBER DRINK MIX PO), Take by mouth., Disp: , Rfl:    Dulaglutide (TRULICITY) 3 BD/5.3GD SOPN, Inject 3 mg as directed once a week. (Patient not taking: Reported on 09/11/2021), Disp: 12 mL, Rfl: 1   No Known Allergies   Review of Systems  Constitutional: Negative.   Respiratory: Negative.    Cardiovascular: Negative.   Neurological:  Negative for dizziness and headaches.    Today's Vitals   11/18/21 1202  BP: 112/80  Pulse: 82  Temp: 98.8 F (37.1 C)  Weight: 154 lb (69.9 kg)  Height: 5' 4.8" (1.646 m)  PainSc: 0-No pain   Body mass index is 25.79 kg/m.   Objective:  Physical Exam Vitals reviewed.  Constitutional:      General: She is not in acute distress.    Appearance: Normal appearance. She is normal weight.  Cardiovascular:     Rate and Rhythm: Normal rate and regular rhythm.     Pulses: Normal pulses.     Heart sounds: Normal heart sounds. No murmur heard. Pulmonary:     Effort: Pulmonary effort is normal. No respiratory distress.     Breath sounds: Normal breath sounds. No wheezing.  Skin:    General: Skin is warm and dry.     Capillary Refill: Capillary refill takes less than 2 seconds.  Neurological:     General: No focal deficit present.     Mental Status: She is alert and oriented to person, place, and time.     Cranial Nerves: No cranial nerve deficit.     Motor: No weakness.  Psychiatric:        Mood and Affect: Mood normal.        Behavior: Behavior normal.        Thought Content: Thought content normal.        Judgment: Judgment normal.        Assessment And Plan:     1. Type 2 diabetes mellitus with stage 3a chronic kidney disease, without long-term current use of insulin (HCC) Comments: She is now taking Trulicity 3 mg weekly and tolerating well.  - Hemoglobin A1c - CMP14+EGFR  2. Essential  hypertension Comments: Blood pressure is well controlled. Continue current medications - CMP14+EGFR  3. Mixed hyperlipidemia Comments: Normal lipid panel the last 3 times will check at next visit. Tolerating statin well  4. Immunization declined Comments: Declines shingrix  5. BMI 25.0-25.9,adult She is encouraged to strive for BMI less than 25 to decrease cardiac risk. Advised to aim for at least 150 minutes of exercise per week.    Patient was given opportunity to ask questions. Patient verbalized understanding of the plan and was able to repeat key elements of the plan. All questions were answered to  their satisfaction.  [name], FNP   I, [name], FNP, have reviewed all documentation for this visit. The documentation on 11/18/21 for the exam, diagnosis, procedures, and orders are all accurate and complete.   IF YOU HAVE BEEN REFERRED TO A SPECIALIST, IT MAY TAKE 1-2 WEEKS TO SCHEDULE/PROCESS THE REFERRAL. IF YOU HAVE NOT HEARD FROM US/SPECIALIST IN TWO WEEKS, PLEASE GIVE [place] A CALL AT [phone].   THE PATIENT IS ENCOURAGED TO PRACTICE SOCIAL DISTANCING DUE TO THE COVID-19 PANDEMIC.

## 2021-11-19 ENCOUNTER — Other Ambulatory Visit: Payer: Self-pay | Admitting: Nurse Practitioner

## 2021-11-19 LAB — CMP14+EGFR
ALT: 50 IU/L — ABNORMAL HIGH (ref 0–32)
AST: 28 IU/L (ref 0–40)
Albumin/Globulin Ratio: 2.3 — ABNORMAL HIGH (ref 1.2–2.2)
Albumin: 4.8 g/dL — ABNORMAL HIGH (ref 3.7–4.7)
Alkaline Phosphatase: 82 IU/L (ref 44–121)
BUN/Creatinine Ratio: 22 (ref 12–28)
BUN: 26 mg/dL (ref 8–27)
Bilirubin Total: 0.5 mg/dL (ref 0.0–1.2)
CO2: 22 mmol/L (ref 20–29)
Calcium: 10 mg/dL (ref 8.7–10.3)
Chloride: 105 mmol/L (ref 96–106)
Creatinine, Ser: 1.18 mg/dL — ABNORMAL HIGH (ref 0.57–1.00)
Globulin, Total: 2.1 g/dL (ref 1.5–4.5)
Glucose: 137 mg/dL — ABNORMAL HIGH (ref 70–99)
Potassium: 4.6 mmol/L (ref 3.5–5.2)
Sodium: 144 mmol/L (ref 134–144)
Total Protein: 6.9 g/dL (ref 6.0–8.5)
eGFR: 48 mL/min/{1.73_m2} — ABNORMAL LOW (ref >59)

## 2021-11-19 LAB — HEMOGLOBIN A1C
Est. average glucose Bld gHb Est-mCnc: 154 mg/dL
Hgb A1c MFr Bld: 7 % — ABNORMAL HIGH (ref 4.8–5.6)

## 2021-11-24 ENCOUNTER — Telehealth: Payer: Self-pay

## 2021-11-24 NOTE — Chronic Care Management (AMB) (Signed)
Chronic Care Management Pharmacy Assistant   Name: Catherine [name]  MRN: [id number] DOB: [birth year]  Reason for Encounter: Disease State/ Hypertension  Recent office visits:  11-18-2021 [name], [place]. Glucose= 137, Creatinine= 1.18, eGFR= 48, Albumin= 4.8, Albumin/globulin= 2.3, ALT= 50. A1C= 7.0.  Recent consult visits:  None  Hospital visits:  None in previous 6 months  Medications: Outpatient Encounter Medications as of 11/24/2021  Medication Sig Note   Accu-Chek FastClix Lancets MISC AS DIRECTED TWICE A DAY SUBCUTANEOUS 90 DAYS E11.9    ACCU-CHEK GUIDE test strip USE TO CHECK BLOOD SUGARS TWICE DAILY E11.69    Ascorbic Acid (VITAMIN C) 1000 MG tablet Take 1,000 mg by mouth daily.    aspirin 81 MG tablet Take 81 mg by mouth daily.    atorvastatin (LIPITOR) 10 MG tablet TAKE 1 TABLET BY MOUTH EVERY DAY    beta carotene w/minerals (OCUVITE) tablet Take 1 tablet by mouth daily. 08/17/2018: Uses preservision   calcium citrate-vitamin D (CITRACAL+D) 315-200 MG-UNIT tablet Take 1 tablet by mouth 2 (two) times daily.    Cyanocobalamin (VITAMIN B 12 PO) Take 1,000 mg by mouth daily.    Dulaglutide (TRULICITY) 1.5 LG/4.9JS SOPN Inject into the skin once a week.    Dulaglutide (TRULICITY) 3 UN/9.9VA SOPN Inject 3 mg as directed once a week. (Patient not taking: Reported on 09/11/2021)    FARXIGA 10 MG TABS tablet TAKE 1 TABLET BY MOUTH EVERY DAY    ferrous sulfate 325 (65 FE) MG tablet Take 1 tablet (325 mg total) by mouth 2 (two) times daily.    hydrocortisone (ANUSOL-HC) 2.5 % rectal cream PLACE 1 APPLICATION RECTALLY 2 (TWO) TIMES DAILY.    levothyroxine (SYNTHROID) 112 MCG tablet TAKE 1 TABLET BY MOUTH EVERY DAY BEFORE BREAKFAST    lisinopril (ZESTRIL) 2.5 MG tablet Take 1 tablet (2.5 mg total) by mouth daily.    Multiple Vitamin (MULTIVITAMIN WITH MINERALS) TABS tablet Take 1 tablet by mouth daily.    mupirocin ointment (BACTROBAN) 2 % APPLY TO AFFECTED AREA TWICE A DAY     Omega-3 Fatty Acids (FISH OIL) 1000 MG CAPS Take 1 capsule by mouth daily.     Wheat Dextrin (BENEFIBER DRINK MIX PO) Take by mouth.    No facility-administered encounter medications on file as of 11/24/2021.   Reviewed chart prior to disease state call. Spoke with patient regarding BP  Recent Office Vitals: BP Readings from Last 3 Encounters:  11/18/21 112/80  09/11/21 116/78  04/29/21 114/70   Pulse Readings from Last 3 Encounters:  11/18/21 82  09/11/21 71  04/29/21 79    Wt Readings from Last 3 Encounters:  11/18/21 154 lb (69.9 kg)  09/11/21 156 lb 12.8 oz (71.1 kg)  08/12/21 161 lb 3.2 oz (73.1 kg)     Kidney Function Lab Results  Component Value Date/Time   CREATININE 1.18 (H) 11/18/2021 12:26 PM   CREATININE 1.18 (H) 08/12/2021 12:25 PM   CREATININE 1.79 (H) 12/21/2017 11:26 AM   GFRNONAA 42 (L) 11/05/2020 12:51 PM   GFRNONAA 28 (L) 12/21/2017 11:26 AM   GFRAA 48 (L) 11/05/2020 12:51 PM   GFRAA 32 (L) 12/21/2017 11:26 AM    BMP Latest Ref Rng & Units 11/18/2021 08/12/2021 04/29/2021  Glucose 70 - 99 mg/dL 137(H) 133(H) 230(H)  BUN 8 - 27 mg/dL '26 21 20  ' Creatinine 0.57 - 1.00 mg/dL 1.18(H) 1.18(H) 1.42(H)  BUN/Creat Ratio 12 - '28 22 18 14  ' Sodium 134 - 144 mmol/L  144 144 138  Potassium 3.5 - 5.2 mmol/L 4.6 4.7 4.2  Chloride 96 - 106 mmol/L 105 105 99  CO2 20 - 29 mmol/L '22 23 22  ' Calcium 8.7 - 10.3 mg/dL 10.0 10.0 9.6    Current antihypertensive regimen:  Lisinopril 2.5 mg daily  How often are you checking your Blood Pressure? daily  Current home BP readings: 01-09 103/52 62, 01-08 106/50 72 111/50 66  What recent interventions/DTPs have been made by any provider to improve Blood Pressure control since last CPP Visit:  Educated on BP goals and benefits of medications for prevention of heart attack, stroke and kidney damage; Daily salt intake goal < 2300 mg; Importance of home blood pressure monitoring; Proper BP monitoring technique;  -Counseled to  monitor BP at home one time per day, document, and provide log at future appointments -Recommended to continue current medication  Any recent hospitalizations or ED visits since last visit with CPP? No  What diet changes have been made to improve Blood Pressure Control?  Patient states she has limited her salt intake and has increased water intake.  What exercise is being done to improve your Blood Pressure Control?  Patient states she walks around some days cleaning around the house  Adherence Review: Is the patient currently on ACE/ARB medication? Yes Does the patient have >5 day gap between last estimated fill dates? No  NOTES: Rescheduled patient's missed appointment with Orlando Penner CPP in December to February.  Care Gaps: Yearly Ophthalmology exam overdue AWV 02-12-2022  Star Rating Drugs: Trulicity 3 mg- Patient assistance Atorvastatin 10 mg- Last filled 11-19-2020 [facility] Farxiga 10 mg- Last filled 10-28-2021 [facility] Lisinopril 2.5 mg- Last filled 08-24-2021 [facility]  [place] Clinical Pharmacist Assistant [phone]

## 2021-12-15 ENCOUNTER — Other Ambulatory Visit: Payer: Self-pay | Admitting: Nurse Practitioner

## 2021-12-15 DIAGNOSIS — E039 Hypothyroidism, unspecified: Secondary | ICD-10-CM

## 2021-12-16 ENCOUNTER — Ambulatory Visit: Payer: Medicare Other | Admitting: Nurse Practitioner

## 2021-12-22 ENCOUNTER — Telehealth: Payer: Self-pay

## 2021-12-22 NOTE — Chronic Care Management (AMB) (Signed)
° ° °  Chronic Care Management Pharmacy Assistant   Name: Catherine [name]  MRN: [id number] DOB: 08-06-1946  Reason for Encounter: Patient Assistance Coordination  12/22/2021- Patient left a voicemail regarding Trulicity patient assistance medication. Called patient to inform that application was fixed and resent to [employer], no answer, no voicemail pick up. Will retry.    Medications: Outpatient Encounter Medications as of 12/22/2021  Medication Sig Note   Accu-Chek FastClix Lancets MISC AS DIRECTED TWICE A DAY SUBCUTANEOUS 90 DAYS E11.9    ACCU-CHEK GUIDE test strip USE TO CHECK BLOOD SUGARS TWICE DAILY E11.69    Ascorbic Acid (VITAMIN C) 1000 MG tablet Take 1,000 mg by mouth daily.    aspirin 81 MG tablet Take 81 mg by mouth daily.    atorvastatin (LIPITOR) 10 MG tablet TAKE 1 TABLET BY MOUTH EVERY DAY    beta carotene w/minerals (OCUVITE) tablet Take 1 tablet by mouth daily. 08/17/2018: Uses preservision   calcium citrate-vitamin D (CITRACAL+D) 315-200 MG-UNIT tablet Take 1 tablet by mouth 2 (two) times daily.    Cyanocobalamin (VITAMIN B 12 PO) Take 1,000 mg by mouth daily.    Dulaglutide (TRULICITY) 1.5 MG/0.5ML SOPN Inject into the skin once a week.    Dulaglutide (TRULICITY) 3 MG/0.5ML SOPN Inject 3 mg as directed once a week. (Patient not taking: Reported on 09/11/2021)    FARXIGA 10 MG TABS tablet TAKE 1 TABLET BY MOUTH EVERY DAY    ferrous sulfate 325 (65 FE) MG tablet Take 1 tablet (325 mg total) by mouth 2 (two) times daily.    hydrocortisone (ANUSOL-HC) 2.5 % rectal cream PLACE 1 APPLICATION RECTALLY 2 (TWO) TIMES DAILY.    levothyroxine (SYNTHROID) 112 MCG tablet TAKE 1 TABLET BY MOUTH EVERY DAY BEFORE BREAKFAST    lisinopril (ZESTRIL) 2.5 MG tablet Take 1 tablet (2.5 mg total) by mouth daily.    Multiple Vitamin (MULTIVITAMIN WITH MINERALS) TABS tablet Take 1 tablet by mouth daily.    mupirocin ointment (BACTROBAN) 2 % APPLY TO AFFECTED AREA TWICE A DAY    Omega-3 Fatty Acids  (FISH OIL) 1000 MG CAPS Take 1 capsule by mouth daily.     Wheat Dextrin (BENEFIBER DRINK MIX PO) Take by mouth.    No facility-administered encounter medications on file as of 12/22/2021.    Billee Cashing, CMA Clinical Pharmacist Assistant [phone]

## 2021-12-25 ENCOUNTER — Other Ambulatory Visit: Payer: Self-pay | Admitting: Nurse Practitioner

## 2021-12-25 DIAGNOSIS — E119 Type 2 diabetes mellitus without complications: Secondary | ICD-10-CM

## 2021-12-25 DIAGNOSIS — I10 Essential (primary) hypertension: Secondary | ICD-10-CM

## 2021-12-25 DIAGNOSIS — E039 Hypothyroidism, unspecified: Secondary | ICD-10-CM

## 2021-12-25 DIAGNOSIS — L0292 Furuncle, unspecified: Secondary | ICD-10-CM

## 2022-01-05 ENCOUNTER — Telehealth: Payer: Self-pay

## 2022-01-05 NOTE — Chronic Care Management (AMB) (Signed)
°  Catherine [name] was reminded to have all medications, supplements and any blood glucose and blood pressure readings available for review with Orlando Penner, Pharm. D, at her telephone visit on 01-09-2022 at 10:00.  Questions: Have you had any recent office visit or specialist visit outside of [place]? Patient stated no  Are there any concerns you would like to discuss during your office visit? Patient stated no  Are you having any problems obtaining your medications? (Whether it pharmacy issues or cost) Patient stated no  If patient has any PAP medications ask if they are having any problems getting their PAP medication or refill? Patient stated no.  Care Gaps: Yearly ophthalmology exam overdue AWV 02-12-2022  Star Rating Drug: Trulicity 3 mg- Patient assistance Atorvastatin 10 mg- Last filled 12-25-2021 [facility] Farxiga 10 mg- Last filled 12-25-2021 [facility] Lisinopril 2.5 mg- Last [facility]  Any gaps in medications fill history? No  [place] Pharmacist Assistant [phone]

## 2022-01-08 ENCOUNTER — Telehealth: Payer: Self-pay

## 2022-01-08 NOTE — Chronic Care Management (AMB) (Signed)
01/08/2022- called [employer], patient was approved for Trulicity on [id number], Medication was shipped on 12/17/2021.  Pattricia Boss, [place] Pharmacist Assistant [phone]

## 2022-01-09 ENCOUNTER — Ambulatory Visit (INDEPENDENT_AMBULATORY_CARE_PROVIDER_SITE_OTHER): Payer: Medicare Other

## 2022-01-09 DIAGNOSIS — E1122 Type 2 diabetes mellitus with diabetic chronic kidney disease: Secondary | ICD-10-CM

## 2022-01-09 DIAGNOSIS — N1831 Chronic kidney disease, stage 3a: Secondary | ICD-10-CM

## 2022-01-09 DIAGNOSIS — I10 Essential (primary) hypertension: Secondary | ICD-10-CM

## 2022-01-09 DIAGNOSIS — E782 Mixed hyperlipidemia: Secondary | ICD-10-CM

## 2022-01-09 NOTE — Progress Notes (Signed)
Chronic Care Management Pharmacy Note  01/20/2022 Name:  Catherine [name] MRN:  [id number] DOB:  [birth year]  Summary: Patient reports that she is taking her medication everyday   Recommendations/Changes made from today's visit: Recommend continuing to drink more water. Recommend start using upper arm cuff for BP readings. Recommend patients Atorvastatin 10 mg tablet be increased to 20 mg tablet daily.   Plan: Collaborated with PCP team patients Atorvastatin to be increased to 20 mg tablet daily. Patient is aware, labs to be completed in 6 weeks to include a lipid panel and chemistry.    Subjective: Catherine [name] is an 76 y.o. year old female who is a primary patient of [name], [place].  The CCM team was consulted for assistance with disease management and care coordination needs.    Engaged with patient by telephone for follow up visit in response to provider referral for pharmacy case management and/or care coordination services. Patient reports that she is doing well   Consent to Services:  The patient was given information about Chronic Care Management services, agreed to services, and gave verbal consent prior to initiation of services.  Please see initial visit note for detailed documentation.   Patient Care Team: [name], FNP as PCP - General ([place]) Brunetta Genera, MD as Consulting Physician (Hematology) Mayford Knife, [facility] (Pharmacist)  Recent office visits: 11/18/2021 PCP OV 09/11/2021 PCP OV  Recent consult visits: None noted   Hospital visits: None in previous 6 months   Objective:  Lab Results  Component Value Date   CREATININE 1.18 (H) 11/18/2021   BUN 26 11/18/2021   EGFR 48 (L) 11/18/2021   GFRNONAA 42 (L) 11/05/2020   GFRAA 48 (L) 11/05/2020   NA 144 11/18/2021   K 4.6 11/18/2021   CALCIUM 10.0 11/18/2021   CO2 22 11/18/2021   GLUCOSE 137 (H) 11/18/2021    Lab Results  Component Value Date/Time   HGBA1C 7.0 (H)  11/18/2021 12:26 PM   HGBA1C 8.2 (H) 08/12/2021 12:25 PM   MICROALBUR 10 08/12/2021 12:52 PM   MICROALBUR 10 08/06/2020 01:40 PM    Last diabetic Eye exam:  Lab Results  Component Value Date/Time   HMDIABEYEEXA No Retinopathy 11/13/2020 12:00 AM    Last diabetic Foot exam: No results found for: HMDIABFOOTEX   Lab Results  Component Value Date   CHOL 151 08/12/2021   HDL 54 08/12/2021   LDLCALC 78 08/12/2021   TRIG 101 08/12/2021   CHOLHDL 2.8 08/12/2021    Hepatic Function Latest Ref Rng & Units 11/18/2021 08/12/2021 04/29/2021  Total Protein 6.0 - 8.5 g/dL 6.9 7.0 7.1  Albumin 3.7 - 4.7 g/dL 4.8(H) 5.2(H) 4.5  AST 0 - 40 IU/L '28 19 23  ' ALT 0 - 32 IU/L 50(H) 26 23  Alk Phosphatase 44 - 121 IU/L 82 87 93  Total Bilirubin 0.0 - 1.2 mg/dL 0.5 0.5 0.6    Lab Results  Component Value Date/Time   TSH 0.694 04/29/2021 12:26 PM   TSH 1.500 11/05/2020 12:51 PM   FREET4 1.42 10/05/2019 08:55 AM   FREET4 1.50 05/10/2019 10:37 AM    CBC Latest Ref Rng & Units 08/12/2021 01/23/2021 08/06/2020  WBC 3.4 - 10.8 x10E3/uL 3.7 3.6 4.4  Hemoglobin 11.1 - 15.9 g/dL 12.5 11.0(L) 11.4  Hematocrit 34.0 - 46.6 % 37.4 32.3(L) 33.7(L)  Platelets 150 - 450 x10E3/uL 205 211 215    Lab Results  Component Value Date/Time   VD25OH 34.5 01/23/2021 11:54  AM   VD25OH 33.6 11/05/2020 12:51 PM    Clinical ASCVD: Yes  The 10-year ASCVD risk score (Arnett DK, et al., 2019) is: 37.2%   Values used to calculate the score:     Age: 9 years     Sex: Female     Is Non-Hispanic African American: Yes     Diabetic: Yes     Tobacco smoker: Yes     Systolic Blood Pressure: 001 mmHg     Is BP treated: Yes     HDL Cholesterol: 54 mg/dL     Total Cholesterol: 151 mg/dL    Depression screen [facility] 2/9 01/23/2021 12/10/2020 01/18/2020  Decreased Interest 0 1 0  Down, Depressed, Hopeless 0 1 0  PHQ - 2 Score 0 2 0  Altered sleeping - 1 0  Tired, decreased energy - 1 0  Change in appetite - 1 0  Feeling bad or  failure about yourself  - 1 0  Trouble concentrating - 1 0  Moving slowly or fidgety/restless - 0 0  Suicidal thoughts - 0 0  PHQ-9 Score - 7 0  Difficult doing work/chores - - Not difficult at all     Social History   Tobacco Use  Smoking Status Never  Smokeless Tobacco Never   BP Readings from Last 3 Encounters:  11/18/21 112/80  09/11/21 116/78  04/29/21 114/70   Pulse Readings from Last 3 Encounters:  11/18/21 82  09/11/21 71  04/29/21 79   Wt Readings from Last 3 Encounters:  11/18/21 154 lb (69.9 kg)  09/11/21 156 lb 12.8 oz (71.1 kg)  08/12/21 161 lb 3.2 oz (73.1 kg)   BMI Readings from Last 3 Encounters:  11/18/21 25.79 kg/m  09/11/21 26.91 kg/m  08/12/21 27.67 kg/m    Assessment/Interventions: Review of patient past medical history, allergies, medications, health status, including review of consultants reports, laboratory and other test data, was performed as part of comprehensive evaluation and provision of chronic care management services.   SDOH:  (Social Determinants of Health) assessments and interventions performed: No  SDOH Screenings   Alcohol Screen: Not on file  Depression (PHQ2-9): Low Risk    PHQ-2 Score: 0  Financial Resource Strain: Low Risk    Difficulty of Paying Living Expenses: Not hard at all  Food Insecurity: No Food Insecurity   Worried About Charity fundraiser in the Last Year: Never true   Ran Out of Food in the Last Year: Never true  Housing: Not on file  Physical Activity: Inactive   Days of Exercise per Week: 0 days   Minutes of Exercise per Session: 0 min  Social Connections: Not on file  Stress: No Stress Concern Present   Feeling of Stress : Not at all  Tobacco Use: Low Risk    Smoking Tobacco Use: Never   Smokeless Tobacco Use: Never   Passive Exposure: Not on file  Transportation Needs: No Transportation Needs   Lack of Transportation (Medical): No   Lack of Transportation (Non-Medical): No    CCM Care  Plan  No Known Allergies  Medications Reviewed Today     Reviewed by Mayford Knife, RPH (Pharmacist) on 01/09/22 at 1217  Med List Status: <None>   Medication Order Taking? Sig Documenting Provider Last Dose Status Informant  Accu-Chek FastClix Lancets MISC [id number] No AS DIRECTED TWICE A DAY SUBCUTANEOUS 90 DAYS E11.9 [name], FNP Taking Active   ACCU-CHEK GUIDE test strip [id number]  USE TO CHECK BLOOD SUGARS  TWICE DAILY E11.69 [name], FNP  Active   Ascorbic Acid (VITAMIN C) 1000 MG tablet [id number] No Take 1,000 mg by mouth daily. [provider] Taking Active   aspirin 81 MG tablet [id number] No Take 81 mg by mouth daily. [provider] Taking Active Multiple Informants  atorvastatin (LIPITOR) 10 MG tablet [id number]  TAKE 1 TABLET BY MOUTH EVERY DAY [name], FNP  Active   beta carotene w/minerals (OCUVITE) tablet [id number] No Take 1 tablet by mouth daily. [provider] Taking Active            Med Note Kellie Simmering   Wed Aug 17, 2018  9:06 AM) Uses preservision  calcium citrate-vitamin D (CITRACAL+D) 315-200 MG-UNIT tablet [id number] No Take 1 tablet by mouth 2 (two) times daily. [provider] Taking Active   Cyanocobalamin (VITAMIN B 12 PO) [id number] No Take 1,000 mg by mouth daily. [provider] Taking Active Multiple Informants  dapagliflozin propanediol (FARXIGA) 10 MG TABS tablet [id number]  Take 1 tablet (10 mg total) by mouth daily before breakfast. Please d/c Farxiga 78m from the pt's profile.  Thanks. MMinette Brine FNP  Active   Dulaglutide (TRULICITY) 1.5 MRC/7.8LFSOPN [id number] Inject into the skin once a week. [provider] Taking Active   Dulaglutide (TRULICITY) 3 MHE/5.2DPSOPN [id number] Inject 3 mg as directed once a week.  Patient not taking: Reported on 09/11/2021   MMinette Brine FNP Not Taking Active   FARXIGA 10 MG TABS tablet [id number] TAKE 1 TABLET BY MOUTH EVERY DAY  MMinette Brine FNP Taking Active   ferrous sulfate 325 (65 FE) MG tablet [id number] Take 1 tablet (325 mg total) by mouth 2 (two) times daily. MMinette Brine FNP Taking Active   hydrocortisone (ANUSOL-HC) 2.5 % rectal cream [id number] PLACE 1 APPLICATION RECTALLY 2 (TWO) TIMES DAILY. MMinette Brine FNP Taking Active   levothyroxine (SYNTHROID) 112 MCG tablet [id number] TAKE 1 TABLET BY MOUTH EVERY DAY BEFORE BEvelena Peat FNP  Active   lisinopril (ZESTRIL) 2.5 MG tablet [id number] TAKE 1 TABLET BY MOUTH EVERY DAY MMinette Brine FNP  Active   Multiple Vitamin (MULTIVITAMIN WITH MINERALS) TABS tablet [id number] Take 1 tablet by mouth daily. [provider] Taking Active   mupirocin ointment (BACTROBAN) 2 % [id number] APPLY TO AFFECTED AREA TWICE A DAY MMinette Brine FNP  Active   Omega-3 Fatty Acids (FISH OIL) 1000 MG CAPS [id number] Take 1 capsule by mouth daily.  [provider] Taking Active Self  Wheat Dextrin (BENEFIBER DRINK MIX PO) [id number] Take by mouth. [provider] Taking Active             Patient Active Problem List   Diagnosis Date Noted   Sickle-cell trait ([place] 10/13/2021   Essential hypertension 02/06/2019   Hordeolum externum of left upper eyelid 12/14/2018   Benign hypertension with chronic kidney disease, stage III ([place] 08/03/2018   Anemia 08/23/2014   Hypokalemia 08/23/2014   DKA, type 2 ([place] 08/13/2014   Hypernatremia 08/13/2014   Elevated LFTs 08/13/2014   Acquired hypothyroidism 08/13/2014    Immunization History  Administered Date(s) Administered   Fluad Quad(high Dose 65+) 08/11/2019, 09/13/2020, 08/12/2021   Influenza, High Dose Seasonal PF 08/08/2018   Influenza,inj,Quad PF,6+ Mos 08/16/2014   Influenza-Unspecified 08/08/2018, 08/11/2019   PFIZER Comirnaty(Gray Top)Covid-19 Tri-Sucrose Vaccine 06/27/2021   PFIZER(Purple Top)SARS-COV-2 Vaccination 12/21/2019, 01/11/2020, 10/23/2020, 06/27/2021,  09/18/2021   PNEUMOCOCCAL CONJUGATE-20 09/18/2021  Pneumococcal Conjugate-13 10/02/2020   Pneumococcal Polysaccharide-23 08/24/2019   Tdap 09/18/2019    Conditions to be addressed/monitored:  Hypertension, Coronary Artery Disease, and Asthma  Care Plan : Catherine [name]  Updates made by Mayford Knife, RPH since 01/20/2022 12:00 AM     Problem: HTN, HLD, DM II   Priority: High     Long-Range Goal: Disease Management   Recent Progress: On track  Priority: High  Note:   Current Barriers:  Unable to independently monitor therapeutic efficacy  Pharmacist Clinical Goal(s):  Patient will achieve adherence to monitoring guidelines and medication adherence to achieve therapeutic efficacy through collaboration with PharmD and provider.   Interventions: 1:1 collaboration with [name], FNP regarding development and update of comprehensive plan of care as evidenced by provider attestation and co-signature Inter-disciplinary care team collaboration (see longitudinal plan of care) Comprehensive medication review performed; medication list updated in electronic medical record  Hypertension (BP goal <130/80) -Controlled -Current treatment: Lisinopril 2.5 mg tablet once per day Appropriate, Effective, Safe, Accessible  -Current home readings:2/22- 95/54, 2/24 - 100/50 pulse 65  -Current dietary habits: please see hyperlipidemia  -Current exercise habits: please see hyperlipidemia -Denies hypotensive/hypertensive symptoms -Educated on Symptoms of hypotension and importance of maintaining adequate hydration; -Counseled to monitor BP at home at least three  times per week, document, and provide log at future appointments -We discussed the difference between the wrist cuff and the arm cuff.  -Recommended to continue current medication   Hyperlipidemia: (LDL goal < 70) -Uncontrolled -Current treatment: Atorvastatin 10 mg tablet once per day Appropriate, Effective, Safe,  Accessible -Current dietary patterns: please see diabetes for more details  -Current exercise habits: patient is up and down taking care of her husband and completing things in her home.  -Educated on Cholesterol goals;  Benefits of statin for ASCVD risk reduction; Importance of limiting foods high in cholesterol; -Recommended patients medication regimen be changed to 20 mg tablet once per day. She will have labs completed in 6 weeks from the date.    Diabetes (A1c goal <7%) -Not ideally controlled -Current medications: Farxiga 10 mg tablet once per day Appropriate, Effective, Safe, Accessible Trulicity 3 JY/7.8 ml-injecting once per week Appropriate, Effective, Safe, Accessible -Current home glucose readings: she  fasting glucose: 2/24 -131, 2/23-136, 134, 133, 132, 135 -Denies hypoglycemic/hyperglycemic symptoms -Current meal patterns: she is doing well with her meals. She has cut back on her snacks and she is choosing a quick snack that is healthy including fruits like apples or oranges. Sometimes she has a half banana.  drinks: she is drinking more water  -Current exercise habits: patient is up and down taking care of her husband and completing things in her home.  -Congratulated Catherine [name] on her decrease in A1c by more than 1 point  -Patient reports that there are days where she is under stress but she finds quiet moments to readjust herself to do better with her diabetes.  -Recommended patient drink at least 74 ounces of water per day  -Educated on A1c and blood sugar goals; Complications of diabetes including kidney damage, retinal damage, and cardiovascular disease; -Counseled to check feet daily and get yearly eye exams -Recommended to continue current medication  Patient Goals/Self-Care Activities Patient will:  - take medications as prescribed as evidenced by patient report and record review  Follow Up Plan: The patient has been provided with contact information for the  care management team and has been advised to call with any health  related questions or concerns.       Medication Assistance:  Wilder Glade and Trulicity obtained through [employer] and [employer] medication assistance program.  Enrollment ends 10/2022  Compliance/Adherence/Medication fill history: Care Gaps: Ophthalmology Exam  Star-Rating Drugs: Atorvastatin 10 mg tablet  Farxiga 10 mg tablet  Trulicity  Lisinopril 2.5 mg tablet  Patient's preferred pharmacy is:  [id number] [place], [place] - [id number] [id number]: [phone]: [phone] Uses pill box? Yes Pt endorses 95% compliance  We discussed: Benefits of medication synchronization, packaging and delivery as well as enhanced pharmacist oversight with [employer]. Patient decided to: Continue current medication management strategy  Care Plan and Follow Up Patient Decision:  Patient agrees to Care Plan and Follow-up.  Plan: The patient has been provided with contact information for the care management team and has been advised to call with any health related questions or concerns.  Next AWV (Annual Wellness Visit) scheduled for: 02/12/2022 Next PCP visit scheduled for: 02/12/2022    VOrlando Penner CPP, PharmD Clinical Pharmacist Practitioner [facility] [phone]

## 2022-01-13 DIAGNOSIS — E1122 Type 2 diabetes mellitus with diabetic chronic kidney disease: Secondary | ICD-10-CM | POA: Diagnosis not present

## 2022-01-13 DIAGNOSIS — E782 Mixed hyperlipidemia: Secondary | ICD-10-CM | POA: Diagnosis not present

## 2022-01-13 DIAGNOSIS — I10 Essential (primary) hypertension: Secondary | ICD-10-CM | POA: Diagnosis not present

## 2022-01-13 DIAGNOSIS — N1831 Chronic kidney disease, stage 3a: Secondary | ICD-10-CM | POA: Diagnosis not present

## 2022-01-20 ENCOUNTER — Other Ambulatory Visit: Payer: Self-pay

## 2022-01-20 DIAGNOSIS — Z79899 Other long term (current) drug therapy: Secondary | ICD-10-CM

## 2022-01-20 MED ORDER — ATORVASTATIN CALCIUM 20 MG PO TABS: 20.0000 mg | ORAL_TABLET | Freq: Every day | ORAL | 1 refills | Status: DC

## 2022-01-20 NOTE — Patient Instructions (Signed)
Visit Information It was great speaking with you today!  Please let me know if you have any questions about our visit.   Goals Addressed             This Visit's Progress    Manage My Medicine       Timeframe:  Long-Range Goal Priority:  High Start Date:                             Expected End Date:                       Follow Up Date 05/06/2022   In Progress:  - call for medicine refill 2 or 3 days before it runs out - call if I am sick and can't take my medicine - keep a list of all the medicines I take; vitamins and herbals too - use a pillbox to sort medicine    Why is this important?   These steps will help you keep on track with your medicines.        Patient Care Plan: CCM Pharmacy Care Plan     Problem Identified: HTN, HLD, DM II   Priority: High     Long-Range Goal: Disease Management   Recent Progress: On track  Priority: High  Note:   Current Barriers:  Unable to independently monitor therapeutic efficacy  Pharmacist Clinical Goal(s):  Patient will achieve adherence to monitoring guidelines and medication adherence to achieve therapeutic efficacy through collaboration with PharmD and provider.   Interventions: 1:1 collaboration with [name], FNP regarding development and update of comprehensive plan of care as evidenced by provider attestation and co-signature Inter-disciplinary care team collaboration (see longitudinal plan of care) Comprehensive medication review performed; medication list updated in electronic medical record  Hypertension (BP goal <130/80) -Controlled -Current treatment: Lisinopril 2.5 mg tablet once per day Appropriate, Effective, Safe, Accessible  -Current home readings:2/22- 95/54, 2/24 - 100/50 pulse 65  -Current dietary habits: please see hyperlipidemia  -Current exercise habits: please see hyperlipidemia -Denies hypotensive/hypertensive symptoms -Educated on Symptoms of hypotension and importance of maintaining  adequate hydration; -Counseled to monitor BP at home at least three  times per week, document, and provide log at future appointments -We discussed the difference between the wrist cuff and the arm cuff.  -Recommended to continue current medication   Hyperlipidemia: (LDL goal < 70) -Uncontrolled -Current treatment: Atorvastatin 10 mg tablet once per day Appropriate, Effective, Safe, Accessible -Current dietary patterns: please see diabetes for more details  -Current exercise habits: patient is up and down taking care of her husband and completing things in her home.  -Educated on Cholesterol goals;  Benefits of statin for ASCVD risk reduction; Importance of limiting foods high in cholesterol; -Recommended patients medication regimen be changed to 20 mg tablet once per day. She will have labs completed in 6 weeks from the date.    Diabetes (A1c goal <7%) -Not ideally controlled -Current medications: Farxiga 10 mg tablet once per day Appropriate, Effective, Safe, Accessible Trulicity 3 [id number] ml-injecting once per week Appropriate, Effective, Safe, Accessible -Current home glucose readings: she  fasting glucose: 2/24 -131, 2/23-136, 134, 133, 132, 135 -Denies hypoglycemic/hyperglycemic symptoms -Current meal patterns: she is doing well with her meals. She has cut back on her snacks and she is choosing a quick snack that is healthy including fruits like apples or oranges. Sometimes she has a half banana.  drinks:  she is drinking more water  -Current exercise habits: patient is up and down taking care of her husband and completing things in her home.  -Congratulated Ms. Toothman on her decrease in A1c by more than 1 point  -Patient reports that there are days where she is under stress but she finds quiet moments to readjust herself to do better with her diabetes.  -Recommended patient drink at least 74 ounces of water per day  -Educated on A1c and blood sugar goals; Complications of  diabetes including kidney damage, retinal damage, and cardiovascular disease; -Counseled to check feet daily and get yearly eye exams -Recommended to continue current medication  Patient Goals/Self-Care Activities Patient will:  - take medications as prescribed as evidenced by patient report and record review  Follow Up Plan: The patient has been provided with contact information for the care management team and has been advised to call with any health related questions or concerns.        Patient agreed to services and verbal consent obtained.   The patient verbalized understanding of instructions, educational materials, and care plan provided today and agreed to receive a mailed copy of patient instructions, educational materials, and care plan.   Orlando Penner, PharmD Clinical Pharmacist [facility] [phone]

## 2022-02-10 ENCOUNTER — Telehealth: Payer: Self-pay

## 2022-02-10 NOTE — Telephone Encounter (Signed)
The pt was notified that her Wilder Glade 10mg  was approved for coverage by her insurance until 02/06/2023. ?

## 2022-02-12 ENCOUNTER — Ambulatory Visit (INDEPENDENT_AMBULATORY_CARE_PROVIDER_SITE_OTHER): Payer: Medicare Other

## 2022-02-12 ENCOUNTER — Ambulatory Visit (INDEPENDENT_AMBULATORY_CARE_PROVIDER_SITE_OTHER): Payer: Medicare Other | Admitting: Nurse Practitioner

## 2022-02-12 ENCOUNTER — Ambulatory Visit: Payer: Medicare Other

## 2022-02-12 VITALS — BP 112/58 | HR 68 | Temp 98.3°F | Ht 65.0 in | Wt 155.0 lb

## 2022-02-12 VITALS — BP 112/58 | HR 68 | Temp 98.3°F | Ht 65.0 in | Wt 155.2 lb

## 2022-02-12 DIAGNOSIS — Z0000 Encounter for general adult medical examination without abnormal findings: Secondary | ICD-10-CM

## 2022-02-12 DIAGNOSIS — I129 Hypertensive chronic kidney disease with stage 1 through stage 4 chronic kidney disease, or unspecified chronic kidney disease: Secondary | ICD-10-CM

## 2022-02-12 DIAGNOSIS — E782 Mixed hyperlipidemia: Secondary | ICD-10-CM

## 2022-02-12 DIAGNOSIS — N1831 Chronic kidney disease, stage 3a: Secondary | ICD-10-CM | POA: Diagnosis not present

## 2022-02-12 DIAGNOSIS — I10 Essential (primary) hypertension: Secondary | ICD-10-CM

## 2022-02-12 DIAGNOSIS — E1122 Type 2 diabetes mellitus with diabetic chronic kidney disease: Secondary | ICD-10-CM | POA: Diagnosis not present

## 2022-02-12 NOTE — Progress Notes (Signed)
?This visit occurred during the SARS-CoV-2 public health emergency.  Safety protocols were in place, including screening questions prior to the visit, additional usage of staff PPE, and extensive cleaning of exam room while observing appropriate contact time as indicated for disinfecting solutions. ? ?Subjective:  ? Catherine [name] is a 76 y.o. female who presents for Medicare Annual (Subsequent) preventive examination. ? ?Review of Systems    ? ?Cardiac Risk Factors include: advanced age (>75men, >40 women);diabetes mellitus;hypertension ? ?   ?Objective:  ?  ?Today's Vitals  ? 02/12/22 1055  ?BP: (!) 112/58  ?Pulse: 68  ?Temp: 98.3 ?F (36.8 ?C)  ?TempSrc: Oral  ?SpO2: 96%  ?Weight: 155 lb 3.2 oz (70.4 kg)  ?Height: 5\' 5"  (1.651 m)  ? ?Body mass index is 25.83 kg/m?. ? ? ?  02/12/2022  ? 11:10 AM 01/23/2021  ? 11:08 AM 01/18/2020  ? 11:54 AM 10/04/2019  ? 12:11 PM 08/17/2018  ?  9:12 AM 12/21/2017  ? 10:38 AM 03/16/2017  ? 10:38 AM  ?Advanced Directives  ?Does Patient Have a Medical Advance Directive? Yes Yes Yes Yes Yes No Yes  ?Type of Paramedic of [place];Living will [place];Living will [place];Living will [place];Living will     ?Does patient want to make changes to medical advance directive?       Yes (MAU/Ambulatory/Procedural Areas - Information given)  ?Copy of [place] in Chart? No - copy requested No - copy requested No - copy requested No - copy requested     ? ? ?Current Medications (verified) ?Outpatient Encounter Medications as of 02/12/2022  ?Medication Sig  ? Accu-Chek FastClix Lancets MISC AS DIRECTED TWICE A DAY SUBCUTANEOUS 90 DAYS E11.9  ? ACCU-CHEK GUIDE test strip USE TO CHECK BLOOD SUGARS TWICE DAILY E11.69  ? Ascorbic Acid (VITAMIN C) 1000 MG tablet Take 1,000 mg by mouth daily.  ? aspirin 81 MG tablet Take 81 mg by mouth daily.  ? atorvastatin (LIPITOR) 20 MG tablet Take 1 tablet (20 mg  total) by mouth daily.  ? beta carotene w/minerals (OCUVITE) tablet Take 1 tablet by mouth daily.  ? calcium citrate-vitamin D (CITRACAL+D) 315-200 MG-UNIT tablet Take 1 tablet by mouth 2 (two) times daily.  ? Cyanocobalamin (VITAMIN B 12 PO) Take 1,000 mg by mouth daily.  ? dapagliflozin propanediol (FARXIGA) 10 MG TABS tablet Take 1 tablet (10 mg total) by mouth daily before breakfast. Please d/c Farxiga 5mg  from the pt's profile.  Thanks.  ? Dulaglutide (TRULICITY) 1.5 0000000 SOPN Inject into the skin once a week.  ? FARXIGA 10 MG TABS tablet TAKE 1 TABLET BY MOUTH EVERY DAY  ? ferrous sulfate 325 (65 FE) MG tablet Take 1 tablet (325 mg total) by mouth 2 (two) times daily.  ? hydrocortisone (ANUSOL-HC) 2.5 % rectal cream PLACE 1 APPLICATION RECTALLY 2 (TWO) TIMES DAILY.  ? levothyroxine (SYNTHROID) 112 MCG tablet TAKE 1 TABLET BY MOUTH EVERY DAY BEFORE BREAKFAST  ? lisinopril (ZESTRIL) 2.5 MG tablet TAKE 1 TABLET BY MOUTH EVERY DAY  ? Multiple Vitamin (MULTIVITAMIN WITH MINERALS) TABS tablet Take 1 tablet by mouth daily.  ? mupirocin ointment (BACTROBAN) 2 % APPLY TO AFFECTED AREA TWICE A DAY  ? Omega-3 Fatty Acids (FISH OIL) 1000 MG CAPS Take 1 capsule by mouth daily.   ? Wheat Dextrin (BENEFIBER DRINK MIX PO) Take by mouth.  ? Dulaglutide (TRULICITY) 3 0000000 SOPN Inject 3 mg as directed once  a week. (Patient not taking: Reported on 09/11/2021)  ? ?No facility-administered encounter medications on file as of 02/12/2022.  ? ? ?Allergies (verified) ?Patient has no known allergies.  ? ?History: ?Past Medical History:  ?Diagnosis Date  ? CAP (community acquired pneumonia)--treated 08/23/2014  ? Diabetes mellitus without complication ([place])   ? DKA (diabetic ketoacidoses) 08/13/2014  ? Hypertension   ? Pneumonia 08/13/2014  ? Thyroid disease   ? ?Past Surgical History:  ?Procedure Laterality Date  ? CHOLECYSTECTOMY    ? ?Family History  ?Problem Relation Age of Onset  ? Diabetes Mellitus II Mother   ? Stroke Mother    ? Diabetes Mellitus II Sister   ? Hypertension Sister   ? ?Social History  ? ?Socioeconomic History  ? Marital status: Married  ?  Spouse name: Not on file  ? Number of children: Not on file  ? Years of education: Not on file  ? Highest education level: Not on file  ?Occupational History  ? Occupation: retired  ?Tobacco Use  ? Smoking status: Never  ? Smokeless tobacco: Never  ?Vaping Use  ? Vaping Use: Never used  ?Substance and Sexual Activity  ? Alcohol use: No  ? Drug use: No  ? Sexual activity: Not Currently  ?Other Topics Concern  ? Not on file  ?Social History Narrative  ? Not on file  ? ?Social Determinants of Health  ? ?Financial Resource Strain: Low Risk   ? Difficulty of Paying Living Expenses: Not hard at all  ?Food Insecurity: No Food Insecurity  ? Worried About Charity fundraiser in the Last Year: Never true  ? Ran Out of Food in the Last Year: Never true  ?Transportation Needs: No Transportation Needs  ? Lack of Transportation (Medical): No  ? Lack of Transportation (Non-Medical): No  ?Physical Activity: Sufficiently Active  ? Days of Exercise per Week: 7 days  ? Minutes of Exercise per Session: 90 min  ?Stress: No Stress Concern Present  ? Feeling of Stress : Only a little  ?Social Connections: Not on file  ? ? ?Tobacco Counseling ?Counseling given: Not Answered ? ? ?Clinical Intake: ? ?Pre-visit preparation completed: Yes ? ?Pain : No/denies pain ? ?  ? ?Nutritional Status: BMI 25 -29 Overweight ?Nutritional Risks: None ?Diabetes: Yes ? ?How often do you need to have someone help you when you read instructions, pamphlets, or other written materials from your doctor or pharmacy?: 1 - Never ?What is the last grade level you completed in school?: 12th grade ? ?Diabetic? Yes ?Nutrition Risk Assessment: ? ?Has the patient had any N/V/D within the last 2 months?  No  ?Does the patient have any non-healing wounds?  No  ?Has the patient had any unintentional weight loss or weight gain?  No   ? ?Diabetes: ? ?Is the patient diabetic?  Yes  ?If diabetic, was a CBG obtained today?  No  ?Did the patient bring in their glucometer from home?  No  ?How often do you monitor your CBG's? Twice daily.  ? ?Financial Strains and Diabetes Management: ? ?Are you having any financial strains with the device, your supplies or your medication? No .  ?Does the patient want to be seen by Chronic Care Management for management of their diabetes?  No  ?Would the patient like to be referred to a Nutritionist or for Diabetic Management?  No  ? ?Diabetic Exams: ? ?Diabetic Eye Exam: Overdue for diabetic eye exam. Pt has been advised about the importance  in completing this exam. Patient advised to call and schedule an eye exam. ?Diabetic Foot Exam: Completed 08/12/2021 ? ? ?Interpreter Needed?: No ? ?Information entered by :: NAllen LPN ? ? ?Activities of Daily Living ? ?  02/12/2022  ? 11:12 AM  ?In your present state of health, do you have any difficulty performing the following activities:  ?Hearing? 0  ?Vision? 0  ?Difficulty concentrating or making decisions? 0  ?Walking or climbing stairs? 0  ?Dressing or bathing? 0  ?Doing errands, shopping? 0  ?Preparing Food and eating ? N  ?Using the Toilet? N  ?In the past six months, have you accidently leaked urine? N  ?Do you have problems with loss of bowel control? N  ?Managing your Medications? N  ?Managing your Finances? N  ?Housekeeping or managing your Housekeeping? N  ? ? ?Patient Care Team: ?[name], FNP as PCP - General (General Practice) ?Brunetta Genera, MD as Consulting Physician (Hematology) ?Mayford Knife, RPH (Pharmacist) ? ?Indicate any recent Medical Services you may have received from other than [facility] providers in the past year (date may be approximate). ? ?   ?Assessment:  ? This is a routine wellness examination for [facility]. ? ?Hearing/Vision screen ?Vision Screening - Comments:: No regular eye exams,  ? ?Dietary issues and exercise activities  discussed: ?Current Exercise Habits: Home exercise routine, Time (Minutes): > 60, Frequency (Times/Week): 7, Weekly Exercise (Minutes/Week): 0 ? ? Goals Addressed   ? ?  ?  ?  ?  ? This Visit's Progress  ?  Patient Stated

## 2022-02-12 NOTE — Patient Instructions (Signed)

## 2022-02-12 NOTE — Patient Instructions (Signed)
Catherine [name] , ?Thank you for taking time to come for your Medicare Wellness Visit. I appreciate your ongoing commitment to your health goals. Please review the following plan we discussed and let me know if I can assist you in the future.  ? ?Screening recommendations/referrals: ?Colonoscopy: completed 02/26/2016, due 02/25/2026 ?Mammogram: completed 10/29/2021, due 10/30/2022 ?Bone Density: completed 10/16/2021 ?Recommended yearly ophthalmology/optometry visit for glaucoma screening and checkup ?Recommended yearly dental visit for hygiene and checkup ? ?Vaccinations: ?Influenza vaccine: completed 08/12/2021, due next flu season ?Pneumococcal vaccine: completed 09/18/2021 ?Tdap vaccine: completed 09/18/2019, due 09/17/2029 ?Shingles vaccine: decline   ?Covid-19: 09/18/2021, 06/27/2021, 10/23/2020, 01/11/2020, 12/21/2019 ? ?Advanced directives: Please bring a copy of your POA (Power of Attorney) and/or Living Will to your next appointment.  ? ?Conditions/risks identified: none ? ?Next appointment: Follow up in one year for your annual wellness visit  ? ? ?Preventive Care 44 Years and Older, Female ?Preventive care refers to lifestyle choices and visits with your health care provider that can promote health and wellness. ?What does preventive care include? ?A yearly physical exam. This is also called an annual well check. ?Dental exams once or twice a year. ?Routine eye exams. Ask your health care provider how often you should have your eyes checked. ?Personal lifestyle choices, including: ?Daily care of your teeth and gums. ?Regular physical activity. ?Eating a healthy diet. ?Avoiding tobacco and drug use. ?Limiting alcohol use. ?Practicing safe sex. ?Taking low-dose aspirin every day. ?Taking vitamin and mineral supplements as recommended by your health care provider. ?What happens during an annual well check? ?The services and screenings done by your health care provider during your annual well check will depend on your age,  overall health, lifestyle risk factors, and family history of disease. ?Counseling  ?Your health care provider may ask you questions about your: ?Alcohol use. ?Tobacco use. ?Drug use. ?Emotional well-being. ?Home and relationship well-being. ?Sexual activity. ?Eating habits. ?History of falls. ?Memory and ability to understand (cognition). ?Work and work Astronomer. ?Reproductive health. ?Screening  ?You may have the following tests or measurements: ?Height, weight, and BMI. ?Blood pressure. ?Lipid and cholesterol levels. These may be checked every 5 years, or more frequently if you are over 37 years old. ?Skin check. ?Lung cancer screening. You may have this screening every year starting at age 82 if you have a 30-pack-year history of smoking and currently smoke or have quit within the past 15 years. ?Fecal occult blood test (FOBT) of the stool. You may have this test every year starting at age 16. ?Flexible sigmoidoscopy or colonoscopy. You may have a sigmoidoscopy every 5 years or a colonoscopy every 10 years starting at age 85. ?Hepatitis C blood test. ?Hepatitis B blood test. ?Sexually transmitted disease (STD) testing. ?Diabetes screening. This is done by checking your blood sugar (glucose) after you have not eaten for a while (fasting). You may have this done every 1-3 years. ?Bone density scan. This is done to screen for osteoporosis. You may have this done starting at age 32. ?Mammogram. This may be done every 1-2 years. Talk to your health care provider about how often you should have regular mammograms. ?Talk with your health care provider about your test results, treatment options, and if necessary, the need for more tests. ?Vaccines  ?Your health care provider may recommend certain vaccines, such as: ?Influenza vaccine. This is recommended every year. ?Tetanus, diphtheria, and acellular pertussis (Tdap, Td) vaccine. You may need a Td booster every 10 years. ?Zoster vaccine. You may need this  after age  32. ?Pneumococcal 13-valent conjugate (PCV13) vaccine. One dose is recommended after age 50. ?Pneumococcal polysaccharide (PPSV23) vaccine. One dose is recommended after age 38. ?Talk to your health care provider about which screenings and vaccines you need and how often you need them. ?This information is not intended to replace advice given to you by your health care provider. Make sure you discuss any questions you have with your health care provider. ?Document Released: 11/29/2015 Document Revised: 07/22/2016 Document Reviewed: 09/03/2015 ?Elsevier Interactive Patient Education ? 2017 [place]. ? ?Fall Prevention in the Home ?Falls can cause injuries. They can happen to people of all ages. There are many things you can do to make your home safe and to help prevent falls. ?What can I do on the outside of my home? ?Regularly fix the edges of walkways and driveways and fix any cracks. ?Remove anything that might make you trip as you walk through a door, such as a raised step or threshold. ?Trim any bushes or trees on the path to your home. ?Use bright outdoor lighting. ?Clear any walking paths of anything that might make someone trip, such as rocks or tools. ?Regularly check to see if handrails are loose or broken. Make sure that both sides of any steps have handrails. ?Any raised decks and porches should have guardrails on the edges. ?Have any leaves, snow, or ice cleared regularly. ?Use sand or salt on walking paths during winter. ?Clean up any spills in your garage right away. This includes oil or grease spills. ?What can I do in the bathroom? ?Use night lights. ?Install grab bars by the toilet and in the tub and shower. Do not use towel bars as grab bars. ?Use non-skid mats or decals in the tub or shower. ?If you need to sit down in the shower, use a plastic, non-slip stool. ?Keep the floor dry. Clean up any water that spills on the floor as soon as it happens. ?Remove soap buildup in the tub or shower  regularly. ?Attach bath mats securely with double-sided non-slip rug tape. ?Do not have throw rugs and other things on the floor that can make you trip. ?What can I do in the bedroom? ?Use night lights. ?Make sure that you have a light by your bed that is easy to reach. ?Do not use any sheets or blankets that are too big for your bed. They should not hang down onto the floor. ?Have a firm chair that has side arms. You can use this for support while you get dressed. ?Do not have throw rugs and other things on the floor that can make you trip. ?What can I do in the kitchen? ?Clean up any spills right away. ?Avoid walking on wet floors. ?Keep items that you use a lot in easy-to-reach places. ?If you need to reach something above you, use a strong step stool that has a grab bar. ?Keep electrical cords out of the way. ?Do not use floor polish or wax that makes floors slippery. If you must use wax, use non-skid floor wax. ?Do not have throw rugs and other things on the floor that can make you trip. ?What can I do with my stairs? ?Do not leave any items on the stairs. ?Make sure that there are handrails on both sides of the stairs and use them. Fix handrails that are broken or loose. Make sure that handrails are as long as the stairways. ?Check any carpeting to make sure that it is firmly attached to the  stairs. Fix any carpet that is loose or worn. ?Avoid having throw rugs at the top or bottom of the stairs. If you do have throw rugs, attach them to the floor with carpet tape. ?Make sure that you have a light switch at the top of the stairs and the bottom of the stairs. If you do not have them, ask someone to add them for you. ?What else can I do to help prevent falls? ?Wear shoes that: ?Do not have high heels. ?Have rubber bottoms. ?Are comfortable and fit you well. ?Are closed at the toe. Do not wear sandals. ?If you use a stepladder: ?Make sure that it is fully opened. Do not climb a closed stepladder. ?Make sure that  both sides of the stepladder are locked into place. ?Ask someone to hold it for you, if possible. ?Clearly mark and make sure that you can see: ?Any grab bars or handrails. ?First and last steps. ?Where the

## 2022-02-12 NOTE — Progress Notes (Signed)
?Industrial/product designer as a Education administrator for [employer], FNP.,have documented all relevant documentation on the behalf of [name], FNP,as directed by  [name], FNP while in the presence of [name], [place]. ? ?This visit occurred during the SARS-CoV-2 public health emergency.  Safety protocols were in place, including screening questions prior to the visit, additional usage of staff PPE, and extensive cleaning of exam room while observing appropriate contact time as indicated for disinfecting solutions. ? ?Subjective:  ?  ? Patient ID: Catherine [name] , female    DOB: Oct 02, 1946 , 76 y.o.   MRN: [id number] ? ? ?Chief Complaint  ?Patient presents with  ? Diabetes  ? ? ?HPI ? ?Patient presents today for diabetes and bp check. She does not have any concerns at this time. Blood pressure ranged 96/55-124/53 with upper arm cuff. She has the Trulicity 3 mg weekly. She is still trying to catch up with the eye doctor ? ?Diabetes ?She presents for her follow-up diabetic visit. She has type 2 diabetes mellitus. Pertinent negatives for hypoglycemia include no dizziness or headaches. There are no diabetic associated symptoms. There are no hypoglycemic complications. There are no diabetic complications. Risk factors for coronary artery disease include sedentary lifestyle, hypertension, diabetes mellitus and dyslipidemia. Current diabetic treatment includes oral agent (dual therapy). When asked about meal planning, she reported none. She rarely participates in exercise. (Blood sugar 125-144) An ACE inhibitor/angiotensin II receptor blocker is being taken. Eye exam is not current (She will schedule an appt).   ? ?Past Medical History:  ?Diagnosis Date  ? CAP (community acquired pneumonia)--treated 08/23/2014  ? Diabetes mellitus without complication ([place])   ? DKA (diabetic ketoacidoses) 08/13/2014  ? Hypertension   ? Pneumonia 08/13/2014  ? Thyroid disease   ?  ? ?Family History  ?Problem Relation Age of Onset  ? Diabetes  Mellitus II Mother   ? Stroke Mother   ? Diabetes Mellitus II Sister   ? Hypertension Sister   ? ? ? ?Current Outpatient Medications:  ?  Accu-Chek FastClix Lancets MISC, AS DIRECTED TWICE A DAY SUBCUTANEOUS 90 DAYS E11.9, Disp: 204 each, Rfl: 4 ?  ACCU-CHEK GUIDE test strip, USE TO CHECK BLOOD SUGARS TWICE DAILY E11.69, Disp: 100 strip, Rfl: 2 ?  Ascorbic Acid (VITAMIN C) 1000 MG tablet, Take 1,000 mg by mouth daily., Disp: , Rfl:  ?  aspirin 81 MG tablet, Take 81 mg by mouth daily., Disp: , Rfl:  ?  atorvastatin (LIPITOR) 20 MG tablet, Take 1 tablet (20 mg total) by mouth daily., Disp: 90 tablet, Rfl: 1 ?  beta carotene w/minerals (OCUVITE) tablet, Take 1 tablet by mouth daily., Disp: , Rfl:  ?  calcium citrate-vitamin D (CITRACAL+D) 315-200 MG-UNIT tablet, Take 1 tablet by mouth 2 (two) times daily., Disp: , Rfl:  ?  Cyanocobalamin (VITAMIN B 12 PO), Take 1,000 mg by mouth daily., Disp: , Rfl:  ?  dapagliflozin propanediol (FARXIGA) 10 MG TABS tablet, Take 1 tablet (10 mg total) by mouth daily before breakfast. Please d/c Farxiga $RemoveBef'5mg'ZlCPbpRKvg$  from the pt's profile.  Thanks., Disp: 90 tablet, Rfl: 1 ?  Dulaglutide (TRULICITY) 1.5 TS/1.7BL SOPN, Inject into the skin once a week., Disp: , Rfl:  ?  Dulaglutide (TRULICITY) 3 TJ/0.3ES SOPN, Inject 3 mg as directed once a week. (Patient not taking: Reported on 09/11/2021), Disp: 12 mL, Rfl: 1 ?  FARXIGA 10 MG TABS tablet, TAKE 1 TABLET BY MOUTH EVERY DAY, Disp: 30 tablet, Rfl: 5 ?  ferrous sulfate  325 (65 FE) MG tablet, Take 1 tablet (325 mg total) by mouth 2 (two) times daily., Disp: 180 tablet, Rfl: 1 ?  hydrocortisone (ANUSOL-HC) 2.5 % rectal cream, Insert 1 application rectally 2 times daily., Disp: 30 g, Rfl: 2 ?  levothyroxine (SYNTHROID) 112 MCG tablet, TAKE 1 TABLET BY MOUTH EVERY DAY BEFORE BREAKFAST, Disp: 90 tablet, Rfl: 0 ?  lisinopril (ZESTRIL) 2.5 MG tablet, TAKE 1 TABLET BY MOUTH EVERY DAY, Disp: 90 tablet, Rfl: 1 ?  Multiple Vitamin (MULTIVITAMIN WITH MINERALS)  TABS tablet, Take 1 tablet by mouth daily., Disp: , Rfl:  ?  mupirocin ointment (BACTROBAN) 2 %, APPLY TO AFFECTED AREA TWICE A DAY, Disp: 22 g, Rfl: 0 ?  Omega-3 Fatty Acids (FISH OIL) 1000 MG CAPS, Take 1 capsule by mouth daily. , Disp: , Rfl:  ?  Wheat Dextrin (BENEFIBER DRINK MIX PO), Take by mouth., Disp: , Rfl:   ? ?No Known Allergies  ? ?Review of Systems  ?Constitutional: Negative.   ?Respiratory: Negative.    ?Cardiovascular: Negative.   ?Gastrointestinal: Negative.   ?Neurological: Negative.  Negative for dizziness and headaches.  ?Psychiatric/Behavioral: Negative.     ? ?Today's Vitals  ? 02/23/22 1626  ?BP: (!) 112/58  ?Pulse: 68  ?Temp: 98.3 ?F (36.8 ?C)  ?TempSrc: Oral  ?Weight: 155 lb (70.3 kg)  ?Height: $RemoveB'5\' 5"'ZQcgnwnR$  (1.651 m)  ? ?Body mass index is 25.79 kg/m?.  ? ?Objective:  ?Physical Exam ?Vitals reviewed.  ?Constitutional:   ?   General: She is not in acute distress. ?   Appearance: Normal appearance. She is normal weight.  ?Cardiovascular:  ?   Rate and Rhythm: Normal rate and regular rhythm.  ?   Pulses: Normal pulses.  ?   Heart sounds: Normal heart sounds. No murmur heard. ?Pulmonary:  ?   Effort: Pulmonary effort is normal. No respiratory distress.  ?   Breath sounds: Normal breath sounds. No wheezing.  ?Skin: ?   General: Skin is warm and dry.  ?   Capillary Refill: Capillary refill takes less than 2 seconds.  ?Neurological:  ?   General: No focal deficit present.  ?   Mental Status: She is alert and oriented to person, place, and time.  ?   Cranial Nerves: No cranial nerve deficit.  ?   Motor: No weakness.  ?Psychiatric:     ?   Mood and Affect: Mood normal.     ?   Behavior: Behavior normal.     ?   Thought Content: Thought content normal.     ?   Judgment: Judgment normal.  ?  ? ?   ?Assessment And Plan:  ?   ?1. Type 2 diabetes mellitus with stage 3a chronic kidney disease, without long-term current use of insulin ([place]) ?Comments: HgbA1c was improved at last visit, referral to new  opthalmologist.  ?- Hemoglobin A1c ?- Ambulatory referral to Ophthalmology ? ?2. Essential hypertension ?Comments: Blood pressure is controlled, continue current medications.  ?- CMP14+EGFR ? ?3. Mixed hyperlipidemia ?Comments: Stable, continue statin. Tolerating well.  ?- Lipid panel ?  ? ? ?Patient was given opportunity to ask questions. Patient verbalized understanding of the plan and was able to repeat key elements of the plan. All questions were answered to their satisfaction.  ?[name], FNP  ? ?I, [name], FNP, have reviewed all documentation for this visit. The documentation on 02/12/22 for the exam, diagnosis, procedures, and orders are all accurate and complete.  ? ? ?IF YOU  HAVE BEEN REFERRED TO A SPECIALIST, IT MAY TAKE 1-2 WEEKS TO SCHEDULE/PROCESS THE REFERRAL. IF YOU HAVE NOT HEARD FROM US/SPECIALIST IN TWO WEEKS, PLEASE GIVE [place] A CALL AT [phone] X [phone].  ? ?THE PATIENT IS ENCOURAGED TO PRACTICE SOCIAL DISTANCING DUE TO THE COVID-19 PANDEMIC.   ?

## 2022-02-13 LAB — CMP14+EGFR
ALT: 41 IU/L — ABNORMAL HIGH (ref 0–32)
AST: 26 IU/L (ref 0–40)
Albumin/Globulin Ratio: 1.9 (ref 1.2–2.2)
Albumin: 4.6 g/dL (ref 3.7–4.7)
Alkaline Phosphatase: 74 IU/L (ref 44–121)
BUN/Creatinine Ratio: 17 (ref 12–28)
BUN: 25 mg/dL (ref 8–27)
Bilirubin Total: 0.6 mg/dL (ref 0.0–1.2)
CO2: 25 mmol/L (ref 20–29)
Calcium: 10.1 mg/dL (ref 8.7–10.3)
Chloride: 102 mmol/L (ref 96–106)
Creatinine, Ser: 1.44 mg/dL — ABNORMAL HIGH (ref 0.57–1.00)
Globulin, Total: 2.4 g/dL (ref 1.5–4.5)
Glucose: 107 mg/dL — ABNORMAL HIGH (ref 70–99)
Potassium: 4.5 mmol/L (ref 3.5–5.2)
Sodium: 140 mmol/L (ref 134–144)
Total Protein: 7 g/dL (ref 6.0–8.5)
eGFR: 38 mL/min/{1.73_m2} — ABNORMAL LOW (ref >59)

## 2022-02-13 LAB — HEMOGLOBIN A1C
Est. average glucose Bld gHb Est-mCnc: 148 mg/dL
Hgb A1c MFr Bld: 6.8 % — ABNORMAL HIGH (ref 4.8–5.6)

## 2022-02-13 LAB — LIPID PANEL
Chol/HDL Ratio: 2.5 ratio (ref 0.0–4.4)
Cholesterol, Total: 142 mg/dL (ref 100–199)
HDL: 56 mg/dL (ref >39)
LDL Chol Calc (NIH): 72 mg/dL (ref 0–99)
Triglycerides: 71 mg/dL (ref 0–149)
VLDL Cholesterol Cal: 14 mg/dL (ref 5–40)

## 2022-02-21 ENCOUNTER — Other Ambulatory Visit: Payer: Self-pay | Admitting: Nurse Practitioner

## 2022-02-23 ENCOUNTER — Encounter: Payer: Self-pay | Admitting: Nurse Practitioner

## 2022-02-23 ENCOUNTER — Other Ambulatory Visit: Payer: Self-pay | Admitting: Nurse Practitioner

## 2022-02-23 DIAGNOSIS — N1832 Chronic kidney disease, stage 3b: Secondary | ICD-10-CM

## 2022-02-23 DIAGNOSIS — E1122 Type 2 diabetes mellitus with diabetic chronic kidney disease: Secondary | ICD-10-CM

## 2022-02-27 ENCOUNTER — Telehealth: Payer: Self-pay

## 2022-02-27 NOTE — Chronic Care Management (AMB) (Signed)
? ? ?Chronic Care Management ?Pharmacy Assistant  ? ?Name: Catherine [name]  MRN: [id number] DOB: 09/14/1946 ? ? ?Reason for Encounter: Disease State/ Diabetes ? ?Recent office visits:  ?02-12-2022 Kellie Simmering, LPN. Medicare annual wellness visit. ? ?02-12-2022 [name], [place]. Glucose= 107, Creatinine= 1.44, eGFR= 38, ALT= 41. A1C= 6.8. Referral placed to ophthalmology. ? ?Recent consult visits:  ?None ? ?Hospital visits:  ?None in previous 6 months ? ?Medications: ?Outpatient Encounter Medications as of 02/27/2022  ?Medication Sig Note  ? Accu-Chek FastClix Lancets MISC AS DIRECTED TWICE A DAY SUBCUTANEOUS 90 DAYS E11.9   ? ACCU-CHEK GUIDE test strip USE TO CHECK BLOOD SUGARS TWICE DAILY E11.69   ? Ascorbic Acid (VITAMIN C) 1000 MG tablet Take 1,000 mg by mouth daily.   ? aspirin 81 MG tablet Take 81 mg by mouth daily.   ? atorvastatin (LIPITOR) 20 MG tablet Take 1 tablet (20 mg total) by mouth daily.   ? beta carotene w/minerals (OCUVITE) tablet Take 1 tablet by mouth daily. 08/17/2018: Uses preservision  ? calcium citrate-vitamin D (CITRACAL+D) 315-200 MG-UNIT tablet Take 1 tablet by mouth 2 (two) times daily.   ? Cyanocobalamin (VITAMIN B 12 PO) Take 1,000 mg by mouth daily.   ? dapagliflozin propanediol (FARXIGA) 10 MG TABS tablet Take 1 tablet (10 mg total) by mouth daily before breakfast. Please d/c Farxiga 15m from the pt's profile.  Thanks.   ? Dulaglutide (TRULICITY) 1.5 MEQ/6.8TMSOPN Inject into the skin once a week.   ? Dulaglutide (TRULICITY) 3 MHD/6.2IWSOPN Inject 3 mg as directed once a week. (Patient not taking: Reported on 09/11/2021)   ? FARXIGA 10 MG TABS tablet TAKE 1 TABLET BY MOUTH EVERY DAY   ? ferrous sulfate 325 (65 FE) MG tablet Take 1 tablet (325 mg total) by mouth 2 (two) times daily.   ? hydrocortisone (ANUSOL-HC) 2.5 % rectal cream Insert 1 application rectally 2 times daily.   ? levothyroxine (SYNTHROID) 112 MCG tablet TAKE 1 TABLET BY MOUTH EVERY DAY BEFORE BREAKFAST   ?  lisinopril (ZESTRIL) 2.5 MG tablet TAKE 1 TABLET BY MOUTH EVERY DAY   ? Multiple Vitamin (MULTIVITAMIN WITH MINERALS) TABS tablet Take 1 tablet by mouth daily.   ? mupirocin ointment (BACTROBAN) 2 % APPLY TO AFFECTED AREA TWICE A DAY   ? Omega-3 Fatty Acids (FISH OIL) 1000 MG CAPS Take 1 capsule by mouth daily.    ? Wheat Dextrin (BENEFIBER DRINK MIX PO) Take by mouth.   ? ?No facility-administered encounter medications on file as of 02/27/2022.  ? ?Recent Relevant Labs: ?Lab Results  ?Component Value Date/Time  ? HGBA1C 6.8 (H) 02/12/2022 12:18 PM  ? HGBA1C 7.0 (H) 11/18/2021 12:26 PM  ? MICROALBUR 10 08/12/2021 12:52 PM  ? MICROALBUR 10 08/06/2020 01:40 PM  ?  ?Kidney Function ?Lab Results  ?Component Value Date/Time  ? CREATININE 1.44 (H) 02/12/2022 12:18 PM  ? CREATININE 1.18 (H) 11/18/2021 12:26 PM  ? CREATININE 1.79 (H) 12/21/2017 11:26 AM  ? GFRNONAA 42 (L) 11/05/2020 12:51 PM  ? GFRNONAA 28 (L) 12/21/2017 11:26 AM  ? GFRAA 48 (L) 11/05/2020 12:51 PM  ? GFRAA 32 (L) 12/21/2017 11:26 AM  ? ? ?Current antihyperglycemic regimen:  ?Farxiga 10 mg daily ?Trulicity 3 mg weekly ? ?What recent interventions/DTPs have been made to improve glycemic control:  ?-Educated on A1c and blood sugar goals; ?Complications of diabetes including kidney damage, retinal damage, and cardiovascular disease; ?-Counseled to check feet daily and get yearly eye  exams ?-Recommended to continue current medication ? ?Have there been any recent hospitalizations or ED visits since last visit with CPP? No ? ?Patient denies hypoglycemic symptoms ? ?Patient denies hyperglycemic symptoms ? ?How often are you checking your blood sugar? twice daily ? ?What are your blood sugars ranging?  ?Fasting: 04-14 155, 04-13 155, 04-12 118, 04-11 119, 04-10 132, 04-09 114 ?Before meals: None ?After meals: 04-09 135, 04-10 129, 04-11 130, 04-12 131, 04-13 130 ?Bedtime: None ? ?During the week, how often does your blood glucose drop below 70? Never ?Are you  checking your feet daily/regularly? Daily ? ?Adherence Review: ?Is the patient currently on a STATIN medication? Yes ?Is the patient currently on ACE/ARB medication? Yes ?Does the patient have >5 day gap between last estimated fill dates? No ? ?NOTES: ?Patient reported Weight 153, BP 04-14 108/59 65, 04-13 112/53 64 110/60 68, 04-12 100/55 69. Patient states she has stopped drinking tea. ? ?Care Gaps: ?Yearly Ophthalmology exam overdue ?Shingrix overdue ? ?Star Rating Drugs: ?Trulicity 3 mg- Patient assistance ?Atorvastatin 10 mg- Last filled 01-20-2022 [facility] ?Farxiga 10 mg- Last filled 12-27-2021 [facility] ?Lisinopril 2.5 mg- Last filled 02-08-2022 [facility] ? ?Malecca Hicks CMA ?Clinical Pharmacist Assistant ?[phone] ? ?

## 2022-03-10 ENCOUNTER — Telehealth: Payer: Self-pay | Admitting: Nurse Practitioner

## 2022-03-10 ENCOUNTER — Other Ambulatory Visit: Payer: Self-pay | Admitting: Nurse Practitioner

## 2022-03-10 ENCOUNTER — Other Ambulatory Visit: Payer: Medicare Other

## 2022-03-10 NOTE — Telephone Encounter (Signed)
Returned call to patient to discuss her kidney functions and referral to Nephrology, she had been going to Nephrology until the provider retired and has not been seen in 1.5 years. Explained we want to get her back reestablished since she has had a decline in her kidney functions and to continue with kidney protective treatment ?

## 2022-03-24 ENCOUNTER — Other Ambulatory Visit: Payer: Self-pay | Admitting: Nurse Practitioner

## 2022-04-01 ENCOUNTER — Telehealth: Payer: Self-pay

## 2022-04-01 NOTE — Chronic Care Management (AMB) (Signed)
Chronic Care Management Pharmacy Assistant   Name: Catherine [name]  MRN: [id number] DOB: 1946-08-17  Reason for Encounter: Patient Assistance Coordination  Patient sent a question to clinical team stating she was on her last pen of trulicity 3mg  and was not sure of what to do. Called patient, she informed me that she received a letter from [facility] welcoming her to the program and they will not supply her free medication through the [employer] patient assistance program and if she has not already set up her auto-refill to call [facility] for this process. Patient also wanted to make sure they have the correct number on file to notify her when shipment will arrive, her last shipment stayed outside a day after it arrived due to her not getting a notification. Advised patient to call number on letter from [facility] to set up autorefill but after checking patient's application she should already be on auto refill. Patient aware I will call [facility] to check status of auto refill and to place an order for patient.   Spoke with [facility] at [place] [place], she does not see that patient in on auto refill but she marked her account as such and working on getting her a refill sent out due to patient being on her last pen that will last for 1 week, patient takes 3 mg of Trulicity once a week. After being placed on hold, [employer] was able to process refill and medication will be shipped in a few days. They have the correct number for patient but they do not notify patients when shipment is being processed.  Called patient back to inform, she is aware, she will make sure to keep an eye on the date of when her shipment is received to calculate when the next one will arrive.  Medications: Outpatient Encounter Medications as of 04/01/2022  Medication Sig Note   Accu-Chek FastClix Lancets MISC AS DIRECTED TWICE A DAY SUBCUTANEOUS 90 DAYS E11.9     ACCU-CHEK GUIDE test strip USE TO CHECK BLOOD SUGARS TWICE DAILY E11.69    Ascorbic Acid (VITAMIN C) 1000 MG tablet Take 1,000 mg by mouth daily.    aspirin 81 MG tablet Take 81 mg by mouth daily.    atorvastatin (LIPITOR) 20 MG tablet Take 1 tablet (20 mg total) by mouth daily.    beta carotene w/minerals (OCUVITE) tablet Take 1 tablet by mouth daily. 08/17/2018: Uses preservision   calcium citrate-vitamin D (CITRACAL+D) 315-200 MG-UNIT tablet Take 1 tablet by mouth 2 (two) times daily.    Cyanocobalamin (VITAMIN B 12 PO) Take 1,000 mg by mouth daily.    dapagliflozin propanediol (FARXIGA) 10 MG TABS tablet Take 1 tablet (10 mg total) by mouth daily before breakfast. Please d/c Farxiga 5mg  from the pt's profile.  Thanks.    Dulaglutide (TRULICITY) 3 MG/0.5ML SOPN Inject 3 mg as directed once a week. (Patient not taking: Reported on 09/11/2021)    FARXIGA 10 MG TABS tablet TAKE 1 TABLET BY MOUTH EVERY DAY    ferrous sulfate 325 (65 FE) MG tablet Take 1 tablet (325 mg total) by mouth 2 (two) times daily.    hydrocortisone (ANUSOL-HC) 2.5 % rectal cream Insert 1 application rectally 2 times daily.    levothyroxine (SYNTHROID) 112 MCG tablet TAKE 1 TABLET BY MOUTH EVERY DAY BEFORE BREAKFAST    lisinopril (ZESTRIL) 2.5 MG tablet TAKE 1 TABLET BY MOUTH EVERY DAY    Multiple Vitamin (MULTIVITAMIN WITH MINERALS)  TABS tablet Take 1 tablet by mouth daily.    mupirocin ointment (BACTROBAN) 2 % APPLY TO AFFECTED AREA TWICE A DAY    Omega-3 Fatty Acids (FISH OIL) 1000 MG CAPS Take 1 capsule by mouth daily.     Wheat Dextrin (BENEFIBER DRINK MIX PO) Take by mouth.    No facility-administered encounter medications on file as of 04/01/2022.   Billee Cashing, CMA Clinical Pharmacist Assistant [phone]

## 2022-05-04 ENCOUNTER — Telehealth: Payer: Self-pay

## 2022-05-04 NOTE — Chronic Care Management (AMB) (Signed)
   Catherine [name] was reminded to have all medications, supplements and any blood glucose and blood pressure readings available for review with Cherylin Mylar, Pharm. D, at her telephone visit on 05-06-2022 at 11:00.    [name] [facility] Clinical Pharmacist Assistant [phone]

## 2022-05-06 ENCOUNTER — Ambulatory Visit (INDEPENDENT_AMBULATORY_CARE_PROVIDER_SITE_OTHER): Payer: Medicare Other

## 2022-05-06 ENCOUNTER — Ambulatory Visit: Payer: Medicare Other

## 2022-05-06 VITALS — BP 122/64 | HR 68 | Temp 98.4°F | Ht 65.0 in | Wt 155.0 lb

## 2022-05-06 DIAGNOSIS — E1122 Type 2 diabetes mellitus with diabetic chronic kidney disease: Secondary | ICD-10-CM

## 2022-05-06 DIAGNOSIS — I10 Essential (primary) hypertension: Secondary | ICD-10-CM

## 2022-05-06 DIAGNOSIS — E782 Mixed hyperlipidemia: Secondary | ICD-10-CM

## 2022-05-06 NOTE — Progress Notes (Signed)
Patient presents today for a bp check. Patient's blood pressure was 122/64 pulse 68 and with her machine it was 118/62 pulse 66. Pt is okay to continue with her lisinopril 2.5mg  advised by provider JM DNP,FNP-BC. She will f/u with her at her next visit. YL,RMA

## 2022-05-06 NOTE — Progress Notes (Cosign Needed)
Chronic Care Management Pharmacy Note  05/06/2022 Name:  Catherine [name] MRN:  [id number] DOB:  1946/03/15  Summary: Patient reports that she is doing well  Recommendations/Changes made from today's visit: Recommend patient have RN in office visit for low BP readings  Recommend patient receive Shingrix vaccine next time she is in office.   Plan: Collaborated with PCP team to schedule RN BP visit today, 05/06/2022 at 2 pm.  Patient to receive the Shingrix vaccine in office during her next visit.    Subjective: Catherine [name] is an 76 y.o. year old female who is a primary patient of [name], [place].  The CCM team was consulted for assistance with disease management and care coordination needs.    Engaged with patient by telephone for follow up visit in response to provider referral for pharmacy case management and/or care coordination services.   Consent to Services:  The patient was given information about Chronic Care Management services, agreed to services, and gave verbal consent prior to initiation of services.  Please see initial visit note for detailed documentation.   Patient Care Team: [name], FNP as PCP - General (General Practice) Brunetta Genera, MD as Consulting Physician (Hematology) Mayford Knife, [facility] (Pharmacist)  Recent office visits: 02/12/2022 PCP OV 11/18/2021 PCP OV  Recent consult visits: Not applicable  Hospital visits: None in previous 6 months   Objective:  Lab Results  Component Value Date   CREATININE 1.44 (H) 02/12/2022   BUN 25 02/12/2022   EGFR 38 (L) 02/12/2022   GFRNONAA 42 (L) 11/05/2020   GFRAA 48 (L) 11/05/2020   NA 140 02/12/2022   K 4.5 02/12/2022   CALCIUM 10.1 02/12/2022   CO2 25 02/12/2022   GLUCOSE 107 (H) 02/12/2022    Lab Results  Component Value Date/Time   HGBA1C 6.8 (H) 02/12/2022 12:18 PM   HGBA1C 7.0 (H) 11/18/2021 12:26 PM   MICROALBUR 10 08/12/2021 12:52 PM   MICROALBUR 10 08/06/2020  01:40 PM    Last diabetic Eye exam:  Lab Results  Component Value Date/Time   HMDIABEYEEXA No Retinopathy 11/13/2020 12:00 AM    Last diabetic Foot exam: No results found for: "HMDIABFOOTEX"   Lab Results  Component Value Date   CHOL 142 02/12/2022   HDL 56 02/12/2022   LDLCALC 72 02/12/2022   TRIG 71 02/12/2022   CHOLHDL 2.5 02/12/2022       Latest Ref Rng & Units 02/12/2022   12:18 PM 11/18/2021   12:26 PM 08/12/2021   12:25 PM  Hepatic Function  Total Protein 6.0 - 8.5 g/dL 7.0  6.9  7.0   Albumin 3.7 - 4.7 g/dL 4.6  4.8  5.2   AST 0 - 40 IU/L _0 ALT 0 - 32 IU/L 41  50  26   Alk Phosphatase 44 - 121 IU/L 74  82  87   Total Bilirubin 0.0 - 1.2 mg/dL 0.6  0.5  0.5     Lab Results  Component Value Date/Time   TSH 0.694 04/29/2021 12:26 PM   TSH 1.500 11/05/2020 12:51 PM   FREET4 1.42 10/05/2019 08:55 AM   FREET4 1.50 05/10/2019 10:37 AM       Latest Ref Rng & Units 08/12/2021   12:25 PM 01/23/2021   11:54 AM 08/06/2020    3:02 PM  CBC  WBC 3.4 - 10.8 x10E3/uL 3.7  3.6  4.4   Hemoglobin 11.1 - 15.9 g/dL 12.5  11.0  11.4   Hematocrit 34.0 - 46.6 % 37.4  32.3  33.7   Platelets 150 - 450 x10E3/uL 205  211  215     Lab Results  Component Value Date/Time   VD25OH 34.5 01/23/2021 11:54 AM   VD25OH 33.6 11/05/2020 12:51 PM    Clinical ASCVD: No  The 10-year ASCVD risk score (Arnett DK, et al., 2019) is: 20%   Values used to calculate the score:     Age: 32 years     Sex: Female     Is Non-Hispanic African American: Yes     Diabetic: Yes     Tobacco smoker: No     Systolic Blood Pressure: 458 mmHg     Is BP treated: Yes     HDL Cholesterol: 56 mg/dL     Total Cholesterol: 142 mg/dL       02/12/2022   11:11 AM 01/23/2021   11:11 AM 12/10/2020    2:31 PM  Depression screen PHQ 2/9  Decreased Interest 0 0 1  Down, Depressed, Hopeless 0 0 1  PHQ - 2 Score 0 0 2  Altered sleeping   1  Tired, decreased energy   1  Change in appetite   1  Feeling bad  or failure about yourself    1  Trouble concentrating   1  Moving slowly or fidgety/restless   0  Suicidal thoughts   0  PHQ-9 Score   7      Social History   Tobacco Use  Smoking Status Never  Smokeless Tobacco Never   BP Readings from Last 3 Encounters:  02/12/22 (!) 112/58  02/23/22 (!) 112/58  11/18/21 112/80   Pulse Readings from Last 3 Encounters:  02/12/22 68  02/23/22 68  11/18/21 82   Wt Readings from Last 3 Encounters:  02/12/22 155 lb 3.2 oz (70.4 kg)  02/23/22 155 lb (70.3 kg)  11/18/21 154 lb (69.9 kg)   BMI Readings from Last 3 Encounters:  02/12/22 25.83 kg/m  02/23/22 25.79 kg/m  11/18/21 25.79 kg/m    Assessment/Interventions: Review of patient past medical history, allergies, medications, health status, including review of consultants reports, laboratory and other test data, was performed as part of comprehensive evaluation and provision of chronic care management services.   SDOH:  (Social Determinants of Health) assessments and interventions performed: Yes  SDOH Screenings   Alcohol Screen: Low Risk  (07/19/2019)   Alcohol Screen    Last Alcohol Screening Score (AUDIT): 0  Depression (PHQ2-9): Low Risk  (02/12/2022)   Depression (PHQ2-9)    PHQ-2 Score: 0  Financial Resource Strain: Low Risk  (02/12/2022)   Overall Financial Resource Strain (CARDIA)    Difficulty of Paying Living Expenses: Not hard at all  Food Insecurity: No Food Insecurity (02/12/2022)   Hunger Vital Sign    Worried About Running Out of Food in the Last Year: Never true    [place] in the Last Year: Never true  Housing: Low Risk  (09/08/2019)   Housing    Last Housing Risk Score: 0  Physical Activity: Sufficiently Active (02/12/2022)   Exercise Vital Sign    Days of Exercise per Week: 7 days    Minutes of Exercise per Session: 90 min  Social Connections: Not on file  Stress: No Stress Concern Present (02/12/2022)   [place]    Feeling of Stress : Only a little  Tobacco Use:  Low Risk  (02/23/2022)   Patient History    Smoking Tobacco Use: Never    Smokeless Tobacco Use: Never    Passive Exposure: Not on file  Transportation Needs: No Transportation Needs (02/12/2022)   PRAPARE - Transportation    Lack of Transportation (Medical): No    Lack of Transportation (Non-Medical): No    CCM Care Plan  No Known Allergies  Medications Reviewed Today     Reviewed by [name], FNP (Family Nurse Practitioner) on 02/23/22 at [place] List Status: <None>   Medication Order Taking? Sig Documenting Provider Last Dose Status Informant  Accu-Chek FastClix Lancets MISC [id number] No AS DIRECTED TWICE A DAY SUBCUTANEOUS 90 DAYS E11.9 [name], FNP Taking Active   ACCU-CHEK GUIDE test strip [id number] No USE TO CHECK BLOOD SUGARS TWICE DAILY E11.69 [name], FNP Taking Active   Ascorbic Acid (VITAMIN C) 1000 MG tablet [id number] No Take 1,000 mg by mouth daily. [provider] Taking Active   aspirin 81 MG tablet [id number] No Take 81 mg by mouth daily. [provider] Taking Active Multiple Informants  atorvastatin (LIPITOR) 20 MG tablet [id number] No Take 1 tablet (20 mg total) by mouth daily. [name], FNP Taking Active   beta carotene w/minerals (OCUVITE) tablet [id number] No Take 1 tablet by mouth daily. [provider] Taking Active            Med Note Kellie Simmering   Wed Aug 17, 2018  9:06 AM) Uses preservision  calcium citrate-vitamin D (CITRACAL+D) 315-200 MG-UNIT tablet [id number] No Take 1 tablet by mouth 2 (two) times daily. [provider] Taking Active   Cyanocobalamin (VITAMIN B 12 PO) [id number] No Take 1,000 mg by mouth daily. [provider] Taking Active Multiple Informants  dapagliflozin propanediol (FARXIGA) 10 MG TABS tablet [id number] No Take 1 tablet (10 mg total) by mouth daily before breakfast. Please d/c  Farxiga 19m from the pt's profile.  Thanks. MMinette Brine FNP Taking Active   Dulaglutide (TRULICITY) 1.5 MZY/6.0YTSOPN [id number] Inject into the skin once a week. [provider] Taking Active   Dulaglutide (TRULICITY) 3 MTF/5.7DUSOPN [id number] Inject 3 mg as directed once a week.  Patient not taking: Reported on 09/11/2021   MMinette Brine FNP Not Taking Active   FARXIGA 10 MG TABS tablet [id number] TAKE 1 TABLET BY MOUTH EVERY DAY MMinette Brine FNP Taking Active   ferrous sulfate 325 (65 FE) MG tablet [id number] Take 1 tablet (325 mg total) by mouth 2 (two) times daily. MMinette Brine FNP Taking Active   hydrocortisone (ANUSOL-HC) 2.5 % rectal cream [id number] Insert 1 application rectally 2 times daily. MMinette Brine FNP  Active   levothyroxine (SYNTHROID) 112 MCG tablet [id number] TAKE 1 TABLET BY MOUTH EVERY DAY BEFORE BEvelena Peat FNP Taking Active   lisinopril (ZESTRIL) 2.5 MG tablet [id number] TAKE 1 TABLET BY MOUTH EVERY DAY MMinette Brine FNP Taking Active   Multiple Vitamin (MULTIVITAMIN WITH MINERALS) TABS tablet [id number] Take 1 tablet by mouth daily. [provider] Taking Active   mupirocin ointment (BACTROBAN) 2 % [id number] APPLY TO AFFECTED AREA TWICE A DAY MMinette Brine FNP Taking Active   Omega-3 Fatty Acids (FISH OIL) 1000 MG CAPS [id number] Take 1 capsule by mouth daily.  [provider] Taking Active Self  Wheat Dextrin (BENEFIBER DRINK MIX PO) [id number] Take by mouth. 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[place]) 08/03/2018   Anemia 08/23/2014   Hypokalemia 08/23/2014   DKA, type 2 ([place]) 08/13/2014   Hypernatremia 08/13/2014   Elevated LFTs 08/13/2014    Acquired hypothyroidism 08/13/2014    Immunization History  Administered Date(s) Administered   Fluad Quad(high Dose 65+) 08/11/2019, 09/13/2020, 08/12/2021   Influenza, High Dose Seasonal PF 08/08/2018   Influenza,inj,Quad PF,6+ Mos 08/16/2014   Influenza-Unspecified 08/08/2018, 08/11/2019   PFIZER Comirnaty(Gray Top)Covid-19 Tri-Sucrose Vaccine 06/27/2021   PFIZER(Purple Top)SARS-COV-2 Vaccination 12/21/2019, 01/11/2020, 10/23/2020, 06/27/2021, 09/18/2021   PNEUMOCOCCAL CONJUGATE-20 09/18/2021   Pneumococcal Conjugate-13 10/02/2020   Pneumococcal Polysaccharide-23 08/24/2019   Tdap 09/18/2019    Conditions to be addressed/monitored:  Hyperlipidemia and Diabetes  Care Plan : [place]  Updates made by Mayford Knife, [place] since 05/06/2022 12:00 AM     Problem: HTN, HLD, DM II   Priority: High     Long-Range Goal: Disease Management   Recent Progress: On track  Priority: High  Note:   Current Barriers:  Unable to independently afford treatment regimen Unable to independently monitor therapeutic efficacy  Pharmacist Clinical Goal(s):  Patient will achieve adherence to monitoring guidelines and medication adherence to achieve therapeutic efficacy through collaboration with PharmD and provider.   Interventions: 1:1 collaboration with [name], FNP regarding development and update of comprehensive plan of care as evidenced by provider attestation and co-signature Inter-disciplinary care team collaboration (see longitudinal plan of care) Comprehensive medication review performed; medication list updated in electronic medical record  Hypertension (BP goal <130/80) -Controlled -Current treatment: Lisinopril 2.5 mg tablet once per day Appropriate, Effective, Safe, Accessible -Current home readings: 112/53 HR:60, 115/52, 116/60 HR:59, 114/50 HR:63  -Current dietary habits: she is not eating any foods with salt in, she sometimes has a burger from  [employer] -Denies hypotensive/hypertensive symptoms -Educated on BP goals and benefits of medications for prevention of heart attack, stroke and kidney damage; Importance of home blood pressure monitoring; Symptoms of hypotension and importance of maintaining adequate hydration; -Counseled to monitor BP at home at least three times per week, document, and provide log at future appointments -Recommended patient come in to be seen to have a BP check completed.  -Recommended to continue current medication   Hyperlipidemia: (LDL goal < 70) -Controlled -Current treatment: Atorvastatin 20 mg tablet once per day. Appropriate, Effective, Safe, Accessible -Current dietary patterns: she is eating fried and fatty foods in moderation. She is mostly eating healthy.  -Current exercise: she is very proud of herself. She worked in her yard by trimming her shrubberies and she cleaned all of it up. -Educated on Cholesterol goals;  Importance of limiting foods high in cholesterol; -Recommended to continue current medication  Diabetes (A1c goal <7%) -Controlled -Current medications: Trulicity 3 mg once per week on Tuesday Appropriate, Effective, Safe, Accessible Through patient assistance she is receiving her medication through [facility] 10 mg tablet once per day Appropriate, Effective, Safe, Accessible  She picks up from the pharmacy -Current home glucose readings fasting glucose: 6/11-132, 6/12- 125, 6/13-135, 6/14-130, 6/15-123, 6/16-120, 6/17-130, 6/18 - 126, 6/19-122, 6/20 - 130, 6/21- 127 post prandial glucose - 127, 6/12- 139, 6/13-130, 6/14-129, 6/15-129,6/16-128, 6/18-130,6/19-129, 6/20-130 -Denies hypoglycemic/hyperglycemic symptoms -Current meal patterns:  drinks: she is drinking plenty of water, she is drinking  at least 64 ounces of water.  -Current exercise: she is very proud of herself. She worked in her yard by trimming her shrubberies and she cleaned all of it  up. -She reports that it made her feel good -Educated on Prevention and management of hypoglycemic episodes; Benefits of routine self-monitoring of blood sugar; -Patient reports that she is paying 15 dollars for Farxiga, and [facility] -Counseled to check feet daily and get yearly eye exams -Recommended to continue current medication  Health Maintenance -Vaccine gaps: Shingrix Vaccine  -Current therapy:  Ocuvite once per day  Vitamin B-12 once per day Ferrous Sulfate 325 mg tablet once per day Multivitamin once per day Omega 3 Fatty Acids once per day  -Educated on Cost vs benefit of each product must be carefully weighed by individual consumer -Patient is satisfied with current therapy and denies issues   Patient Goals/Self-Care Activities Patient will:  - take medications as prescribed as evidenced by patient report and record review collaborate with provider on medication access solutions  Follow Up Plan: The patient has been provided with contact information for the care management team and has been advised to call with any health related questions or concerns.       Medication Assistance:  Trulicity obtained through [place] medication assistance program.  Enrollment ends 10/2022  Compliance/Adherence/Medication fill history: Care Gaps: Shingrix Vaccine Ophthalmology Exam  Star-Rating Drugs: Atorvastatin 20 mg tablet  Farxiga 10 mg tablet  Trulicity 3 mg Lisinopril 2.5 mg tablet   Patient's preferred pharmacy is:  [id number] [place], [place] - [id number] [id number]: [phone]: [phone] Uses pill box? Yes Pt endorses 90% compliance  We discussed: Benefits of medication synchronization, packaging and delivery as well as enhanced pharmacist oversight with [employer]. Patient decided to: Continue current medication management strategy  Care Plan  and Follow Up Patient Decision:  Patient agrees to Care Plan and Follow-up.  Plan: The patient has been provided with contact information for the care management team and has been advised to call with any health related questions or concerns.   VOrlando Penner CPP, PharmD Clinical Pharmacist Practitioner [facility] [phone]

## 2022-05-06 NOTE — Patient Instructions (Addendum)
Visit Information It was great speaking with you today!  Please let me know if you have any questions about our visit.   Goals Addressed             This Visit's Progress    Manage My Medicine       Timeframe:  Long-Range Goal Priority:  High Start Date:                             Expected End Date:                       Follow Up Date 08/07/2022   In Progress:  - call for medicine refill 2 or 3 days before it runs out - call if I am sick and can't take my medicine - keep a list of all the medicines I take; vitamins and herbals too - use a pillbox to sort medicine    Why is this important?   These steps will help you keep on track with your medicines.         Patient Care Plan: CCM Pharmacy Care Plan     Problem Identified: HTN, HLD, DM II   Priority: High     Long-Range Goal: Disease Management   Recent Progress: On track  Priority: High  Note:   Current Barriers:  Unable to independently afford treatment regimen Unable to independently monitor therapeutic efficacy  Pharmacist Clinical Goal(s):  Patient will achieve adherence to monitoring guidelines and medication adherence to achieve therapeutic efficacy through collaboration with PharmD and provider.   Interventions: 1:1 collaboration with [name], FNP regarding development and update of comprehensive plan of care as evidenced by provider attestation and co-signature Inter-disciplinary care team collaboration (see longitudinal plan of care) Comprehensive medication review performed; medication list updated in electronic medical record  Hypertension (BP goal <130/80) -Controlled -Current treatment: Lisinopril 2.5 mg tablet once per day Appropriate, Effective, Safe, Accessible -Current home readings: 112/53 HR:60, 115/52, 116/60 HR:59, 114/50 HR:63  -Current dietary habits: she is not eating any foods with salt in, she sometimes has a burger from [employer] -Denies hypotensive/hypertensive  symptoms -Educated on BP goals and benefits of medications for prevention of heart attack, stroke and kidney damage; Importance of home blood pressure monitoring; Symptoms of hypotension and importance of maintaining adequate hydration; -Counseled to monitor BP at home at least three times per week, document, and provide log at future appointments -Recommended patient come in to be seen to have a BP check completed.  -Recommended to continue current medication   Hyperlipidemia: (LDL goal < 70) -Controlled -Current treatment: Atorvastatin 20 mg tablet once per day. Appropriate, Effective, Safe, Accessible -Current dietary patterns: she is eating fried and fatty foods in moderation. She is mostly eating healthy.  -Current exercise: she is very proud of herself. She worked in her yard by trimming her shrubberies and she cleaned all of it up. -Educated on Cholesterol goals;  Importance of limiting foods high in cholesterol; -Recommended to continue current medication  Diabetes (A1c goal <7%) -Controlled -Current medications: Trulicity 3 mg once per week on Tuesday Appropriate, Effective, Safe, Accessible Through patient assistance she is receiving her medication through [facility] Farxiga 10 mg tablet once per day Appropriate, Effective, Safe, Accessible  She picks up from the pharmacy -Current home glucose readings fasting glucose: 6/11-132, 6/12- 125, 6/13-135, 6/14-130, 6/15-123, 6/16-120, 6/17-130, 6/18 - 126, 6/19-122, 6/20 - 130, 6/21- 127  post prandial glucose - 127, 6/12- 139, 6/13-130, 6/14-129, 6/15-129,6/16-128, 6/18-130,6/19-129, 6/20-130 -Denies hypoglycemic/hyperglycemic symptoms -Current meal patterns:  drinks: she is drinking plenty of water, she is drinking at least 64 ounces of water.  -Current exercise: she is very proud of herself. She worked in her yard by trimming her shrubberies and she cleaned all of it up. -She reports that it made her feel  good -Educated on Prevention and management of hypoglycemic episodes; Benefits of routine self-monitoring of blood sugar; -Patient reports that she is paying 15 dollars for Farxiga, and [facility] -Counseled to check feet daily and get yearly eye exams -Recommended to continue current medication  Health Maintenance -Vaccine gaps: Shingrix Vaccine  -Current therapy:  Ocuvite once per day  Vitamin B-12 once per day Ferrous Sulfate 325 mg tablet once per day Multivitamin once per day Omega 3 Fatty Acids once per day  -Educated on Cost vs benefit of each product must be carefully weighed by individual consumer -Patient is satisfied with current therapy and denies issues   Patient Goals/Self-Care Activities Patient will:  - take medications as prescribed as evidenced by patient report and record review collaborate with provider on medication access solutions  Follow Up Plan: The patient has been provided with contact information for the care management team and has been advised to call with any health related questions or concerns.        Patient agreed to services and verbal consent obtained.   The patient verbalized understanding of instructions, educational materials, and care plan provided today and agreed to receive a mailed copy of patient instructions, educational materials, and care plan.   Cherylin Mylar, PharmD Clinical Pharmacist [facility] [phone]

## 2022-05-10 ENCOUNTER — Other Ambulatory Visit: Payer: Self-pay | Admitting: Nurse Practitioner

## 2022-05-15 DIAGNOSIS — I10 Essential (primary) hypertension: Secondary | ICD-10-CM | POA: Diagnosis not present

## 2022-05-15 DIAGNOSIS — Z7985 Long-term (current) use of injectable non-insulin antidiabetic drugs: Secondary | ICD-10-CM

## 2022-05-15 DIAGNOSIS — E785 Hyperlipidemia, unspecified: Secondary | ICD-10-CM | POA: Diagnosis not present

## 2022-05-15 DIAGNOSIS — E1159 Type 2 diabetes mellitus with other circulatory complications: Secondary | ICD-10-CM | POA: Diagnosis not present

## 2022-05-18 ENCOUNTER — Encounter: Payer: Self-pay | Admitting: Nurse Practitioner

## 2022-05-18 ENCOUNTER — Ambulatory Visit (INDEPENDENT_AMBULATORY_CARE_PROVIDER_SITE_OTHER): Payer: Medicare Other | Admitting: Nurse Practitioner

## 2022-05-18 VITALS — BP 110/68 | HR 74 | Temp 98.2°F | Ht 65.0 in | Wt 155.2 lb

## 2022-05-18 DIAGNOSIS — I10 Essential (primary) hypertension: Secondary | ICD-10-CM

## 2022-05-18 DIAGNOSIS — E782 Mixed hyperlipidemia: Secondary | ICD-10-CM

## 2022-05-18 DIAGNOSIS — E1122 Type 2 diabetes mellitus with diabetic chronic kidney disease: Secondary | ICD-10-CM

## 2022-05-18 DIAGNOSIS — Z6825 Body mass index (BMI) 25.0-25.9, adult: Secondary | ICD-10-CM

## 2022-05-18 DIAGNOSIS — I129 Hypertensive chronic kidney disease with stage 1 through stage 4 chronic kidney disease, or unspecified chronic kidney disease: Secondary | ICD-10-CM

## 2022-05-18 DIAGNOSIS — E039 Hypothyroidism, unspecified: Secondary | ICD-10-CM | POA: Diagnosis not present

## 2022-05-18 DIAGNOSIS — N1831 Chronic kidney disease, stage 3a: Secondary | ICD-10-CM | POA: Diagnosis not present

## 2022-05-18 DIAGNOSIS — Z23 Encounter for immunization: Secondary | ICD-10-CM

## 2022-05-18 NOTE — Patient Instructions (Addendum)
Diabetes Mellitus and Nutrition, Adult When you have diabetes, or diabetes mellitus, it is very important to have healthy eating habits because your blood sugar (glucose) levels are greatly affected by what you eat and drink. Eating healthy foods in the right amounts, at about the same times every day, can help you: Manage your blood glucose. Lower your risk of heart disease. Improve your blood pressure. Reach or maintain a healthy weight. What can affect my meal plan? Every person with diabetes is different, and each person has different needs for a meal plan. Your health care provider may recommend that you work with a dietitian to make a meal plan that is best for you. Your meal plan may vary depending on factors such as: The calories you need. The medicines you take. Your weight. Your blood glucose, blood pressure, and cholesterol levels. Your activity level. Other health conditions you have, such as heart or kidney disease. How do carbohydrates affect me? Carbohydrates, also called carbs, affect your blood glucose level more than any other type of food. Eating carbs raises the amount of glucose in your blood. It is important to know how many carbs you can safely have in each meal. This is different for every person. Your dietitian can help you calculate how many carbs you should have at each meal and for each snack. How does alcohol affect me? Alcohol can cause a decrease in blood glucose (hypoglycemia), especially if you use insulin or take certain diabetes medicines by mouth. Hypoglycemia can be a life-threatening condition. Symptoms of hypoglycemia, such as sleepiness, dizziness, and confusion, are similar to symptoms of having too much alcohol. Do not drink alcohol if: Your health care provider tells you not to drink. You are pregnant, may be pregnant, or are planning to become pregnant. If you drink alcohol: Limit how much you have to: 0-1 drink a day for women. 0-2 drinks a day  for men. Know how much alcohol is in your drink. In the [place] one drink equals one 12 oz bottle of beer (355 mL), one 5 oz glass of wine (148 mL), or one 1 oz glass of hard liquor (44 mL). Keep yourself hydrated with water, diet soda, or unsweetened iced tea. Keep in mind that regular soda, juice, and other mixers may contain a lot of sugar and must be counted as carbs. What are tips for following this plan?  Reading food labels Start by checking the serving size on the Nutrition Facts label of packaged foods and drinks. The number of calories and the amount of carbs, fats, and other nutrients listed on the label are based on one serving of the item. Many items contain more than one serving per package. Check the total grams (g) of carbs in one serving. Check the number of grams of saturated fats and trans fats in one serving. Choose foods that have a low amount or none of these fats. Check the number of milligrams (mg) of salt (sodium) in one serving. Most people should limit total sodium intake to less than 2,300 mg per day. Always check the nutrition information of foods labeled as "low-fat" or "nonfat." These foods may be higher in added sugar or refined carbs and should be avoided. Talk to your dietitian to identify your daily goals for nutrients listed on the label. Shopping Avoid buying canned, pre-made, or processed foods. These foods tend to be high in fat, sodium, and added sugar. Shop around the outside edge of the grocery store. This is where you   will most often find fresh fruits and vegetables, bulk grains, fresh meats, and fresh dairy products. Cooking Use low-heat cooking methods, such as baking, instead of high-heat cooking methods, such as deep frying. Cook using healthy oils, such as olive, canola, or sunflower oil. Avoid cooking with butter, cream, or high-fat meats. Meal planning Eat meals and snacks regularly, preferably at the same times every day. Avoid going long periods  of time without eating. Eat foods that are high in fiber, such as fresh fruits, vegetables, beans, and whole grains. Eat 4-6 oz (112-168 g) of lean protein each day, such as lean meat, chicken, fish, eggs, or tofu. One ounce (oz) (28 g) of lean protein is equal to: 1 oz (28 g) of meat, chicken, or fish. 1 egg.  cup (62 g) of tofu. Eat some foods each day that contain healthy fats, such as avocado, nuts, seeds, and fish. What foods should I eat? Fruits Berries. Apples. Oranges. Peaches. Apricots. Plums. Grapes. Mangoes. Papayas. Pomegranates. Kiwi. Cherries. Vegetables Leafy greens, including lettuce, spinach, kale, chard, collard greens, mustard greens, and cabbage. Beets. Cauliflower. Broccoli. Carrots. Green beans. Tomatoes. Peppers. Onions. Cucumbers. Brussels sprouts. Grains Whole grains, such as whole-wheat or whole-grain bread, crackers, tortillas, cereal, and pasta. Unsweetened oatmeal. Quinoa. Brown or wild rice. Meats and other proteins Seafood. Poultry without skin. Lean cuts of poultry and beef. Tofu. Nuts. Seeds. Dairy Low-fat or fat-free dairy products such as milk, yogurt, and cheese. The items listed above may not be a complete list of foods and beverages you can eat and drink. Contact a dietitian for more information. What foods should I avoid? Fruits Fruits canned with syrup. Vegetables Canned vegetables. Frozen vegetables with butter or cream sauce. Grains Refined white flour and flour products such as bread, pasta, snack foods, and cereals. Avoid all processed foods. Meats and other proteins Fatty cuts of meat. Poultry with skin. Breaded or fried meats. Processed meat. Avoid saturated fats. Dairy Full-fat yogurt, cheese, or milk. Beverages Sweetened drinks, such as soda or iced tea. The items listed above may not be a complete list of foods and beverages you should avoid. Contact a dietitian for more information. Questions to ask a health care provider Do I need  to meet with a certified diabetes care and education specialist? Do I need to meet with a dietitian? What number can I call if I have questions? When are the best times to check my blood glucose? Where to find more information: American Diabetes Association: diabetes.org Academy of Nutrition and Dietetics: eatright.[employer] of Diabetes and Digestive and Kidney Diseases: StageSync.si Association of Diabetes Care & Education Specialists: diabeteseducator.org Summary It is important to have healthy eating habits because your blood sugar (glucose) levels are greatly affected by what you eat and drink. It is important to use alcohol carefully. A healthy meal plan will help you manage your blood glucose and lower your risk of heart disease. Your health care provider may recommend that you work with a dietitian to make a meal plan that is best for you. This information is not intended to replace advice given to you by your health care provider. Make sure you discuss any questions you have with your health care provider. Document Revised: 06/05/2020 Document Reviewed: 06/05/2020 Elsevier Patient Education  2023 Elsevier Inc. Hypertension, Adult Hypertension is another name for high blood pressure. High blood pressure forces your heart to work harder to pump blood. This can cause problems over time. There are two numbers in a blood pressure  reading. There is a top number (systolic) over a bottom number (diastolic). It is best to have a blood pressure that is below 120/80. What are the causes? The cause of this condition is not known. Some other conditions can lead to high blood pressure. What increases the risk? Some lifestyle factors can make you more likely to develop high blood pressure: Smoking. Not getting enough exercise or physical activity. Being overweight. Having too much fat, sugar, calories, or salt (sodium) in your diet. Drinking too much alcohol. Other risk factors  include: Having any of these conditions: Heart disease. Diabetes. High cholesterol. Kidney disease. Obstructive sleep apnea. Having a family history of high blood pressure and high cholesterol. Age. The risk increases with age. Stress. What are the signs or symptoms? High blood pressure may not cause symptoms. Very high blood pressure (hypertensive crisis) may cause: Headache. Fast or uneven heartbeats (palpitations). Shortness of breath. Nosebleed. Vomiting or feeling like you may vomit (nauseous). Changes in how you see. Very bad chest pain. Feeling dizzy. Seizures. How is this treated? This condition is treated by making healthy lifestyle changes, such as: Eating healthy foods. Exercising more. Drinking less alcohol. Your doctor may prescribe medicine if lifestyle changes do not help enough and if: Your top number is above 130. Your bottom number is above 80. Your personal target blood pressure may vary. Follow these instructions at home: Eating and drinking  If told, follow the DASH eating plan. To follow this plan: Fill one half of your plate at each meal with fruits and vegetables. Fill one fourth of your plate at each meal with whole grains. Whole grains include whole-wheat pasta, brown rice, and whole-grain bread. Eat or drink low-fat dairy products, such as skim milk or low-fat yogurt. Fill one fourth of your plate at each meal with low-fat (lean) proteins. Low-fat proteins include fish, chicken without skin, eggs, beans, and tofu. Avoid fatty meat, cured and processed meat, or chicken with skin. Avoid pre-made or processed food. Limit the amount of salt in your diet to less than 1,500 mg each day. Do not drink alcohol if: Your doctor tells you not to drink. You are pregnant, may be pregnant, or are planning to become pregnant. If you drink alcohol: Limit how much you have to: 0-1 drink a day for women. 0-2 drinks a day for men. Know how much alcohol is in  your drink. In the [place] one drink equals one 12 oz bottle of beer (355 mL), one 5 oz glass of wine (148 mL), or one 1 oz glass of hard liquor (44 mL). Lifestyle  Work with your doctor to stay at a healthy weight or to lose weight. Ask your doctor what the best weight is for you. Get at least 30 minutes of exercise that causes your heart to beat faster (aerobic exercise) most days of the week. This may include walking, swimming, or biking. Get at least 30 minutes of exercise that strengthens your muscles (resistance exercise) at least 3 days a week. This may include lifting weights or doing Pilates. Do not smoke or use any products that contain nicotine or tobacco. If you need help quitting, ask your doctor. Check your blood pressure at home as told by your doctor. Keep all follow-up visits. Medicines Take over-the-counter and prescription medicines only as told by your doctor. Follow directions carefully. Do not skip doses of blood pressure medicine. The medicine does not work as well if you skip doses. Skipping doses also puts you at risk  for problems. Ask your doctor about side effects or reactions to medicines that you should watch for. Contact a doctor if: You think you are having a reaction to the medicine you are taking. You have headaches that keep coming back. You feel dizzy. You have swelling in your ankles. You have trouble with your vision. Get help right away if: You get a very bad headache. You start to feel mixed up (confused). You feel weak or numb. You feel faint. You have very bad pain in your: Chest. Belly (abdomen). You vomit more than once. You have trouble breathing. These symptoms may be an emergency. Get help right away. Call 911. Do not wait to see if the symptoms will go away. Do not drive yourself to the hospital. Summary Hypertension is another name for high blood pressure. High blood pressure forces your heart to work harder to pump blood. For most  people, a normal blood pressure is less than 120/80. Making healthy choices can help lower blood pressure. If your blood pressure does not get lower with healthy choices, you may need to take medicine. This information is not intended to replace advice given to you by your health care provider. Make sure you discuss any questions you have with your health care provider. Document Revised: 08/21/2021 Document Reviewed: 08/21/2021 Elsevier Patient Education  2023 [employer].   [facility]  [phone]

## 2022-05-18 NOTE — Progress Notes (Signed)
Barnet Glasgow Martin,acting as a Education administrator for [name], FNP.,have documented all relevant documentation on the behalf of [name], FNP,as directed by  [name], FNP while in the presence of [name], [place].   Subjective:     Patient ID: Catherine [name] , female    DOB: [birth year] , 76 y.o.   MRN: [id number]   Chief Complaint  Patient presents with   Hypertension   Diabetes    HPI  Patient presents today for bp and diabetes check. Patient has no other issues.  Blood pressure has been ranging 92-115/50-69. She has cut back on her tea intake. Her appt with Dr. Katy Fitch is August 18th.  She has not heard from [facility] for an appt.   Hypertension This is a chronic problem. The current episode started more than 1 year ago. The problem is controlled. Pertinent negatives include no anxiety or headaches. There are no associated agents to hypertension.  Diabetes She presents for her follow-up diabetic visit. She has type 2 diabetes mellitus. Pertinent negatives for hypoglycemia include no dizziness or headaches. There are no diabetic associated symptoms. There are no hypoglycemic complications. There are no diabetic complications. Risk factors for coronary artery disease include sedentary lifestyle, hypertension, diabetes mellitus and dyslipidemia. Current diabetic treatment includes oral agent (dual therapy). She is following a diabetic diet. When asked about meal planning, she reported none. She has not had a previous visit with a dietitian. She participates in exercise every other day. Her home blood glucose trend is decreasing steadily. (Blood sugar 122-150, tolerating medications well) An ACE inhibitor/angiotensin II receptor blocker is being taken. She does not see a podiatrist.Eye exam is not current (She will schedule an appt August 18th).     Past Medical History:  Diagnosis Date   CAP (community acquired pneumonia)--treated 08/23/2014   Diabetes mellitus without complication  ([place])    DKA (diabetic ketoacidoses) 08/13/2014   Hypertension    Pneumonia 08/13/2014   Thyroid disease      Family History  Problem Relation Age of Onset   Diabetes Mellitus II Mother    Stroke Mother    Diabetes Mellitus II Sister    Hypertension Sister      Current Outpatient Medications:    Accu-Chek FastClix Lancets MISC, AS DIRECTED TWICE A DAY SUBCUTANEOUS 90 DAYS E11.9, Disp: 204 each, Rfl: 4   ACCU-CHEK GUIDE test strip, USE TO CHECK BLOOD SUGARS TWICE DAILY E11.69, Disp: 100 strip, Rfl: 2   Ascorbic Acid (VITAMIN C) 1000 MG tablet, Take 1,000 mg by mouth daily., Disp: , Rfl:    aspirin 81 MG tablet, Take 81 mg by mouth daily., Disp: , Rfl:    atorvastatin (LIPITOR) 20 MG tablet, Take 1 tablet (20 mg total) by mouth daily., Disp: 90 tablet, Rfl: 1   beta carotene w/minerals (OCUVITE) tablet, Take 1 tablet by mouth daily., Disp: , Rfl:    calcium citrate-vitamin D (CITRACAL+D) 315-200 MG-UNIT tablet, Take 1 tablet by mouth 2 (two) times daily., Disp: , Rfl:    Cyanocobalamin (VITAMIN B 12 PO), Take 1,000 mg by mouth daily., Disp: , Rfl:    dapagliflozin propanediol (FARXIGA) 10 MG TABS tablet, Take 1 tablet (10 mg total) by mouth daily before breakfast. Please d/c Farxiga 28m from the pt's profile.  Thanks., Disp: 90 tablet, Rfl: 1   Dulaglutide (TRULICITY) 3 MOX/7.3ZHSOPN, Inject 3 mg as directed once a week., Disp: 12 mL, Rfl: 1   ferrous sulfate 325 (65 FE) MG tablet, Take  1 tablet (325 mg total) by mouth 2 (two) times daily., Disp: 180 tablet, Rfl: 1   hydrocortisone (ANUSOL-HC) 2.5 % rectal cream, Insert 1 application rectally 2 times daily., Disp: 30 g, Rfl: 2   levothyroxine (SYNTHROID) 112 MCG tablet, TAKE 1 TABLET BY MOUTH EVERY DAY BEFORE BREAKFAST, Disp: 90 tablet, Rfl: 0   lisinopril (ZESTRIL) 2.5 MG tablet, TAKE 1 TABLET BY MOUTH EVERY DAY, Disp: 90 tablet, Rfl: 1   Multiple Vitamin (MULTIVITAMIN WITH MINERALS) TABS tablet, Take 1 tablet by mouth daily., Disp: ,  Rfl:    mupirocin ointment (BACTROBAN) 2 %, APPLY TO AFFECTED AREA TWICE A DAY, Disp: 22 g, Rfl: 0   Omega-3 Fatty Acids (FISH OIL) 1000 MG CAPS, Take 1 capsule by mouth daily. , Disp: , Rfl:    Wheat Dextrin (BENEFIBER DRINK MIX PO), Take by mouth., Disp: , Rfl:    No Known Allergies   Review of Systems  Constitutional: Negative.   Respiratory: Negative.    Cardiovascular: Negative.   Gastrointestinal: Negative.   Neurological: Negative.  Negative for dizziness and headaches.  Psychiatric/Behavioral: Negative.       Today's Vitals   05/18/22 1137  BP: 110/68  Pulse: 74  Temp: 98.2 F (36.8 C)  TempSrc: Oral  Weight: 155 lb 3.2 oz (70.4 kg)  Height: '5\' 5"'  (1.651 m)  PainSc: 0-No pain   Body mass index is 25.83 kg/m.  Wt Readings from Last 3 Encounters:  05/18/22 155 lb 3.2 oz (70.4 kg)  05/06/22 155 lb (70.3 kg)  02/12/22 155 lb 3.2 oz (70.4 kg)      Objective:  Physical Exam Vitals reviewed.  Constitutional:      General: She is not in acute distress.    Appearance: Normal appearance. She is normal weight.  Cardiovascular:     Rate and Rhythm: Normal rate and regular rhythm.     Pulses: Normal pulses.     Heart sounds: Normal heart sounds. No murmur heard. Pulmonary:     Effort: Pulmonary effort is normal. No respiratory distress.     Breath sounds: Normal breath sounds. No wheezing.  Skin:    General: Skin is warm and dry.     Capillary Refill: Capillary refill takes less than 2 seconds.  Neurological:     General: No focal deficit present.     Mental Status: She is alert and oriented to person, place, and time.     Cranial Nerves: No cranial nerve deficit.     Motor: No weakness.  Psychiatric:        Mood and Affect: Mood normal.        Behavior: Behavior normal.        Thought Content: Thought content normal.        Judgment: Judgment normal.         Assessment And Plan:     1. Type 2 diabetes mellitus with stage 3a chronic kidney disease,  without long-term current use of insulin ([place]) Comments: She is doing well overall, continue current medications. Number given to [facility] due to kidney function declined at last visit. - Hemoglobin A1c  2. Essential hypertension - CMP14+EGFR  3. Acquired hypothyroidism Comments: Stable, continue current medications.  - TSH - T4 - T3, free  4. Mixed hyperlipidemia Comments: Controlled, continue statin tolerating well.  - Lipid panel  5. BMI 25.0-25.9,adult       Patient was given opportunity to ask questions. Patient verbalized understanding of the plan and was able  to repeat key elements of the plan. All questions were answered to their satisfaction.  [name], FNP    I, [name], FNP, have reviewed all documentation for this visit. The documentation on 05/18/22 for the exam, diagnosis, procedures, and orders are all accurate and complete.   IF YOU HAVE BEEN REFERRED TO A SPECIALIST, IT MAY TAKE 1-2 WEEKS TO SCHEDULE/PROCESS THE REFERRAL. IF YOU HAVE NOT HEARD FROM US/SPECIALIST IN TWO WEEKS, PLEASE GIVE [place] A CALL AT [phone] X [phone].   THE PATIENT IS ENCOURAGED TO PRACTICE SOCIAL DISTANCING DUE TO THE COVID-19 PANDEMIC.

## 2022-05-19 LAB — T3, FREE: T3, Free: 2.4 pg/mL (ref 2.0–4.4)

## 2022-05-19 LAB — HEMOGLOBIN A1C
Est. average glucose Bld gHb Est-mCnc: 146 mg/dL
Hgb A1c MFr Bld: 6.7 % — ABNORMAL HIGH (ref 4.8–5.6)

## 2022-05-19 LAB — LIPID PANEL
Chol/HDL Ratio: 2.2 ratio (ref 0.0–4.4)
Cholesterol, Total: 143 mg/dL (ref 100–199)
HDL: 64 mg/dL (ref >39)
LDL Chol Calc (NIH): 59 mg/dL (ref 0–99)
Triglycerides: 110 mg/dL (ref 0–149)
VLDL Cholesterol Cal: 20 mg/dL (ref 5–40)

## 2022-05-19 LAB — CMP14+EGFR
ALT: 53 IU/L — ABNORMAL HIGH (ref 0–32)
AST: 24 IU/L (ref 0–40)
Albumin/Globulin Ratio: 2.1 (ref 1.2–2.2)
Albumin: 4.6 g/dL (ref 3.7–4.7)
Alkaline Phosphatase: 79 IU/L (ref 44–121)
BUN/Creatinine Ratio: 21 (ref 12–28)
BUN: 26 mg/dL (ref 8–27)
Bilirubin Total: 0.5 mg/dL (ref 0.0–1.2)
CO2: 21 mmol/L (ref 20–29)
Calcium: 9.6 mg/dL (ref 8.7–10.3)
Chloride: 101 mmol/L (ref 96–106)
Creatinine, Ser: 1.21 mg/dL — ABNORMAL HIGH (ref 0.57–1.00)
Globulin, Total: 2.2 g/dL (ref 1.5–4.5)
Glucose: 114 mg/dL — ABNORMAL HIGH (ref 70–99)
Potassium: 4.3 mmol/L (ref 3.5–5.2)
Sodium: 138 mmol/L (ref 134–144)
Total Protein: 6.8 g/dL (ref 6.0–8.5)
eGFR: 47 mL/min/{1.73_m2} — ABNORMAL LOW (ref >59)

## 2022-05-19 LAB — TSH: TSH: 0.22 u[IU]/mL — ABNORMAL LOW (ref 0.450–4.500)

## 2022-05-19 LAB — T4: T4, Total: 10.4 ug/dL (ref 4.5–12.0)

## 2022-06-16 ENCOUNTER — Telehealth: Payer: Self-pay

## 2022-06-16 NOTE — Chronic Care Management (AMB) (Signed)
Chronic Care Management Pharmacy Assistant   Name: Catherine [name]  MRN: [id number] DOB: 1946/11/10  Reason for Encounter: Disease State/ Diabetes  Recent office visits:  05-18-2022 [name], [place]. Glucose= 114, Creatinine= 1.21, eGFR= 47, ALT= 53. A1C= 6.7. TSH= 0.220.  05-06-2022 Blanca Friend, CMA. Visit for BP check 122/64 68.   Recent consult visits:  None  Hospital visits:  None in previous 6 months  Medications: Outpatient Encounter Medications as of 06/16/2022  Medication Sig Note   Accu-Chek FastClix Lancets MISC AS DIRECTED TWICE A DAY SUBCUTANEOUS 90 DAYS E11.9    ACCU-CHEK GUIDE test strip USE TO CHECK BLOOD SUGARS TWICE DAILY E11.69    Ascorbic Acid (VITAMIN C) 1000 MG tablet Take 1,000 mg by mouth daily.    aspirin 81 MG tablet Take 81 mg by mouth daily.    atorvastatin (LIPITOR) 20 MG tablet Take 1 tablet (20 mg total) by mouth daily.    beta carotene w/minerals (OCUVITE) tablet Take 1 tablet by mouth daily. 08/17/2018: Uses preservision   calcium citrate-vitamin D (CITRACAL+D) 315-200 MG-UNIT tablet Take 1 tablet by mouth 2 (two) times daily.    Cyanocobalamin (VITAMIN B 12 PO) Take 1,000 mg by mouth daily.    dapagliflozin propanediol (FARXIGA) 10 MG TABS tablet Take 1 tablet (10 mg total) by mouth daily before breakfast. Please d/c Farxiga 90m from the pt's profile.  Thanks.    Dulaglutide (TRULICITY) 3 MWU/9.8JXSOPN Inject 3 mg as directed once a week.    ferrous sulfate 325 (65 FE) MG tablet Take 1 tablet (325 mg total) by mouth 2 (two) times daily.    hydrocortisone (ANUSOL-HC) 2.5 % rectal cream Insert 1 application rectally 2 times daily.    levothyroxine (SYNTHROID) 112 MCG tablet TAKE 1 TABLET BY MOUTH EVERY DAY BEFORE BREAKFAST    lisinopril (ZESTRIL) 2.5 MG tablet TAKE 1 TABLET BY MOUTH EVERY DAY    Multiple Vitamin (MULTIVITAMIN WITH MINERALS) TABS tablet Take 1 tablet by mouth daily.    mupirocin ointment (BACTROBAN) 2 % APPLY TO AFFECTED  AREA TWICE A DAY    Omega-3 Fatty Acids (FISH OIL) 1000 MG CAPS Take 1 capsule by mouth daily.     Wheat Dextrin (BENEFIBER DRINK MIX PO) Take by mouth.    No facility-administered encounter medications on file as of 06/16/2022.   Recent Relevant Labs: Lab Results  Component Value Date/Time   HGBA1C 6.7 (H) 05/18/2022 12:08 PM   HGBA1C 6.8 (H) 02/12/2022 12:18 PM   MICROALBUR 10 08/12/2021 12:52 PM   MICROALBUR 10 08/06/2020 01:40 PM    Kidney Function Lab Results  Component Value Date/Time   CREATININE 1.21 (H) 05/18/2022 12:08 PM   CREATININE 1.44 (H) 02/12/2022 12:18 PM   CREATININE 1.79 (H) 12/21/2017 11:26 AM   GFRNONAA 42 (L) 11/05/2020 12:51 PM   GFRNONAA 28 (L) 12/21/2017 11:26 AM   GFRAA 48 (L) 11/05/2020 12:51 PM   GFRAA 32 (L) 12/21/2017 11:26 AM    Current antihyperglycemic regimen:  Trulicity 3 mg weekly (Patient stated she has 2 months worth) Farxiga 10 mg daily  What recent interventions/DTPs have been made to improve glycemic control:  Educated on Prevention and management of hypoglycemic episodes; Benefits of routine self-monitoring of blood sugar; Counseled to check feet daily and get yearly eye exams -Recommended to continue current medication  Have there been any recent hospitalizations or ED visits since last visit with CPP? No  Patient denies hypoglycemic symptoms  Patient denies hyperglycemic symptoms  How often are you checking your blood sugar? twice daily  What are your blood sugars ranging?  Fasting: 08-01 146, 07-31 130, 07-30 142, 07-29 142 Before meals: None After meals: None Bedtime: 07-31 140, 07-30 155, 07-29 140  During the week, how often does your blood glucose drop below 70? Never  Are you checking your feet daily/regularly? Daily  Adherence Review: Is the patient currently on a STATIN medication? Yes Is the patient currently on ACE/ARB medication? Yes Does the patient have >5 day gap between last estimated fill dates?  No   Care Gaps: Shingrix overdue Flu vaccine overdue Yearly ophthalmology exam overdue AWV 02-25-2023  Star Rating Drugs: Atorvastatin 20 mg- Last filled 04-23-2022 [facility] Farxiga 10 mg- Last filled 14-08-3012 [facility] Trulicity 3 mg- Patient assistance Lisinopril 2.5 mg- Last filled 05-31-2022 [facility]  [place] Clinical Pharmacist Assistant [phone]

## 2022-06-25 ENCOUNTER — Telehealth: Payer: Self-pay

## 2022-06-25 NOTE — Chronic Care Management (AMB) (Signed)
06-25-2022: Contacted [facility] patient assistance for pharmacy information. No longer [facility] now Catherine [name] [phone]. Patient's trulicity was processed and should arrive next week. Patient is on auto refill. Patient is aware.  [name] [facility] Clinical Pharmacist Assistant [phone]

## 2022-07-03 DIAGNOSIS — H25813 Combined forms of age-related cataract, bilateral: Secondary | ICD-10-CM | POA: Diagnosis not present

## 2022-07-03 DIAGNOSIS — E119 Type 2 diabetes mellitus without complications: Secondary | ICD-10-CM | POA: Diagnosis not present

## 2022-07-03 LAB — HM DIABETES EYE EXAM

## 2022-07-15 ENCOUNTER — Other Ambulatory Visit: Payer: Self-pay | Admitting: Nurse Practitioner

## 2022-07-15 DIAGNOSIS — E039 Hypothyroidism, unspecified: Secondary | ICD-10-CM

## 2022-07-21 ENCOUNTER — Other Ambulatory Visit: Payer: Self-pay | Admitting: Nurse Practitioner

## 2022-07-29 ENCOUNTER — Encounter: Payer: Self-pay | Admitting: *Deleted

## 2022-07-29 NOTE — Progress Notes (Signed)
[facility] Quality Team Note  Name: Catherine [name] Date of Birth: 1946-05-19 MRN: [id number] Date: 07/29/2022  [facility] Quality Team has reviewed this patient's chart, please see recommendations below:  [facility] Quality Other; (Pt has open gap for KED measure.  Needs urine albumin creatinine ratio test to close gap.  Has ov for 08/17/2022.)

## 2022-07-31 ENCOUNTER — Telehealth: Payer: Self-pay

## 2022-07-31 NOTE — Chronic Care Management (AMB) (Addendum)
Chronic Care Management Pharmacy Assistant   Name: Catherine [name]  MRN: [id number] DOB: Oct 01, 1946  Reason for Encounter: Disease State/ Hypertension  Recent office visits:  05-18-2022 [name], [place]. Glucose= 114, Creatinine= 1.21, eGFR= 47, ALT= 53.  05-06-2022 Blanca Friend, [place]. BP check 122/64.  Recent consult visits:  None  Hospital visits:  None in previous 6 months  Medications: Outpatient Encounter Medications as of 07/31/2022  Medication Sig Note   Accu-Chek FastClix Lancets MISC AS DIRECTED TWICE A DAY SUBCUTANEOUS 90 DAYS E11.9    ACCU-CHEK GUIDE test strip USE TO CHECK BLOOD SUGARS TWICE DAILY E11.69    Ascorbic Acid (VITAMIN C) 1000 MG tablet Take 1,000 mg by mouth daily.    aspirin 81 MG tablet Take 81 mg by mouth daily.    atorvastatin (LIPITOR) 20 MG tablet TAKE 1 TABLET BY MOUTH EVERY DAY    beta carotene w/minerals (OCUVITE) tablet Take 1 tablet by mouth daily. 08/17/2018: Uses preservision   calcium citrate-vitamin D (CITRACAL+D) 315-200 MG-UNIT tablet Take 1 tablet by mouth 2 (two) times daily.    Cyanocobalamin (VITAMIN B 12 PO) Take 1,000 mg by mouth daily.    dapagliflozin propanediol (FARXIGA) 10 MG TABS tablet Take 1 tablet (10 mg total) by mouth daily before breakfast. Please d/c Farxiga 38m from the pt's profile.  Thanks.    Dulaglutide (TRULICITY) 3 MHW/2.9HBSOPN Inject 3 mg as directed once a week.    ferrous sulfate 325 (65 FE) MG tablet Take 1 tablet (325 mg total) by mouth 2 (two) times daily.    hydrocortisone (ANUSOL-HC) 2.5 % rectal cream Insert 1 application rectally 2 times daily.    levothyroxine (SYNTHROID) 112 MCG tablet TAKE 1 TABLET BY MOUTH EVERY DAY BEFORE BREAKFAST    lisinopril (ZESTRIL) 2.5 MG tablet TAKE 1 TABLET BY MOUTH EVERY DAY    Multiple Vitamin (MULTIVITAMIN WITH MINERALS) TABS tablet Take 1 tablet by mouth daily.    mupirocin ointment (BACTROBAN) 2 % APPLY TO AFFECTED AREA TWICE A DAY    Omega-3 Fatty Acids  (FISH OIL) 1000 MG CAPS Take 1 capsule by mouth daily.     Wheat Dextrin (BENEFIBER DRINK MIX PO) Take by mouth.    No facility-administered encounter medications on file as of 07/31/2022.  Reviewed chart prior to disease state call. Spoke with patient regarding BP  Recent Office Vitals: BP Readings from Last 3 Encounters:  05/18/22 110/68  05/06/22 122/64  02/12/22 (!) 112/58   Pulse Readings from Last 3 Encounters:  05/18/22 74  05/06/22 68  02/12/22 68    Wt Readings from Last 3 Encounters:  05/18/22 155 lb 3.2 oz (70.4 kg)  05/06/22 155 lb (70.3 kg)  02/12/22 155 lb 3.2 oz (70.4 kg)     Kidney Function Lab Results  Component Value Date/Time   CREATININE 1.21 (H) 05/18/2022 12:08 PM   CREATININE 1.44 (H) 02/12/2022 12:18 PM   CREATININE 1.79 (H) 12/21/2017 11:26 AM   GFRNONAA 42 (L) 11/05/2020 12:51 PM   GFRNONAA 28 (L) 12/21/2017 11:26 AM   GFRAA 48 (L) 11/05/2020 12:51 PM   GFRAA 32 (L) 12/21/2017 11:26 AM       Latest Ref Rng & Units 05/18/2022   12:08 PM 02/12/2022   12:18 PM 11/18/2021   12:26 PM  BMP  Glucose 70 - 99 mg/dL 114  107  137   BUN 8 - 27 mg/dL '26  25  26   ' Creatinine 0.57 - 1.00 mg/dL 1.21  1.44  1.18   BUN/Creat Ratio 12 - '28 21  17  22   ' Sodium 134 - 144 mmol/L 138  140  144   Potassium 3.5 - 5.2 mmol/L 4.3  4.5  4.6   Chloride 96 - 106 mmol/L 101  102  105   CO2 20 - 29 mmol/L '21  25  22   ' Calcium 8.7 - 10.3 mg/dL 9.6  10.1  10.0     Current antihypertensive regimen:  Lisinopril 2.5 mg daily  How often are you checking your Blood Pressure? daily  Current home BP readings: 09-15 126/60 67, 09-14 118/58 68, 09-13 113/60 65  What recent interventions/DTPs have been made by any provider to improve Blood Pressure control since last CPP Visit:  Educated on BP goals and benefits of medications for prevention of heart attack, stroke and kidney damage; Importance of home blood pressure monitoring; Symptoms of hypotension and importance of  maintaining adequate hydration; -Counseled to monitor BP at home at least three times per week, document, and provide log at future appointments -Recommended patient come in to be seen to have a BP check completed.  -Recommended to continue current medication  Any recent hospitalizations or ED visits since last visit with CPP? No  What diet changes have been made to improve Blood Pressure Control?  Patient stated she doesn't add salt to food and drinks plenty water daily.  What exercise is being done to improve your Blood Pressure Control?  Patient stated she takes care of husband, yard work and cleaning.  Adherence Review: Is the patient currently on ACE/ARB medication? Yes Does the patient have >5 day gap between last estimated fill dates? No  NOTES: Patient is doing well and is to move to [facility] per Orlando Penner.  Care Gaps: Shingrix overdue Covid booster overdue Flu vaccine overdue  Star Rating Drugs: Lisinopril 2.5 mg- Last filled 05-31-2022 [facility] Atorvastatin 20 mg- Last filled 07-21-2022 [facility] Farxiga 10 mg- Last filled [phone] 30 [facility] Trulicity 3 mg- PAP  [place] Clinical Pharmacist Assistant [phone]

## 2022-08-06 ENCOUNTER — Telehealth: Payer: Self-pay

## 2022-08-06 NOTE — Chronic Care Management (AMB) (Signed)
Care Gap(s) Not Met that Need to be Addressed:  Kidney Health Evaluation for Patients With Diabetes   Action Taken: Reviewed chart last eGFR= 47 05-18-2022. Last uACR= 10 08-12-2021. Physical 08-17-2022. Sent message to team to order.    Follow Up: Follow up on 08-19-2022   [place] Pharmacist Assistant [phone]

## 2022-08-07 ENCOUNTER — Telehealth: Payer: Medicare Other

## 2022-08-17 ENCOUNTER — Encounter: Payer: Self-pay | Admitting: Nurse Practitioner

## 2022-08-17 ENCOUNTER — Ambulatory Visit (INDEPENDENT_AMBULATORY_CARE_PROVIDER_SITE_OTHER): Payer: Medicare Other | Admitting: Nurse Practitioner

## 2022-08-17 VITALS — BP 90/62 | HR 81 | Ht 65.0 in | Wt 154.2 lb

## 2022-08-17 DIAGNOSIS — E039 Hypothyroidism, unspecified: Secondary | ICD-10-CM | POA: Diagnosis not present

## 2022-08-17 DIAGNOSIS — N1832 Chronic kidney disease, stage 3b: Secondary | ICD-10-CM

## 2022-08-17 DIAGNOSIS — Z79899 Other long term (current) drug therapy: Secondary | ICD-10-CM | POA: Diagnosis not present

## 2022-08-17 DIAGNOSIS — Z0000 Encounter for general adult medical examination without abnormal findings: Secondary | ICD-10-CM

## 2022-08-17 DIAGNOSIS — I10 Essential (primary) hypertension: Secondary | ICD-10-CM | POA: Diagnosis not present

## 2022-08-17 DIAGNOSIS — R6889 Other general symptoms and signs: Secondary | ICD-10-CM | POA: Diagnosis not present

## 2022-08-17 DIAGNOSIS — E782 Mixed hyperlipidemia: Secondary | ICD-10-CM

## 2022-08-17 DIAGNOSIS — N1831 Chronic kidney disease, stage 3a: Secondary | ICD-10-CM | POA: Diagnosis not present

## 2022-08-17 DIAGNOSIS — E1122 Type 2 diabetes mellitus with diabetic chronic kidney disease: Secondary | ICD-10-CM

## 2022-08-17 DIAGNOSIS — I129 Hypertensive chronic kidney disease with stage 1 through stage 4 chronic kidney disease, or unspecified chronic kidney disease: Secondary | ICD-10-CM | POA: Diagnosis not present

## 2022-08-17 DIAGNOSIS — Z23 Encounter for immunization: Secondary | ICD-10-CM | POA: Diagnosis not present

## 2022-08-17 NOTE — Progress Notes (Signed)
I,Catherine [name],acting as a Education administrator for [employer], FNP.,have documented all relevant documentation on the behalf of Catherine Brine, FNP,as directed by  Catherine Brine, FNP while in the presence of Catherine [name], [place].   Subjective:     Patient ID: Catherine [name] , female    DOB: 07/10/1946 , 76 y.o.   MRN: [id number]   Chief Complaint  Patient presents with   Diabetes   Health Maintenance    HPI  Here for HM, no concerns. Blood pressure readings 96/50 - 116/56  Diabetes She presents for her follow-up diabetic visit. She has type 2 diabetes mellitus. Her disease course has been stable. There are no hypoglycemic associated symptoms. Pertinent negatives for hypoglycemia include no confusion, dizziness, headaches or nervousness/anxiousness. Pertinent negatives for diabetes include no blurred vision, no chest pain, no fatigue, no polydipsia, no polyphagia and no polyuria. There are no hypoglycemic complications. Symptoms are stable. There are no diabetic complications. Pertinent negatives for diabetic complications include no CVA. Risk factors for coronary artery disease include diabetes mellitus and sedentary lifestyle. Current diabetic treatment includes oral agent (dual therapy). She is compliant with treatment all of the time. Her weight is decreasing steadily. She is following a diabetic diet. When asked about meal planning, she reported none. She has not had a previous visit with a dietitian. She rarely participates in exercise. (Blood sugars 269-485 - she is on Trulicity 3 mg weekly) An ACE inhibitor/angiotensin II receptor blocker is being taken. She does not see a podiatrist.Eye exam is not current.  Hypertension This is a chronic problem. The current episode started more than 1 year ago. The problem is unchanged. The problem is controlled. Pertinent negatives include no anxiety, blurred vision, chest pain, headaches or palpitations. There are no associated agents to hypertension. Risk  factors for coronary artery disease include obesity and sedentary lifestyle. Past treatments include ACE inhibitors. The current treatment provides significant improvement. There are no compliance problems.  There is no history of CVA. There is no history of chronic renal disease.     Past Medical History:  Diagnosis Date   CAP (community acquired pneumonia)--treated 08/23/2014   Diabetes mellitus without complication ([place])    DKA (diabetic ketoacidoses) 08/13/2014   Hypertension    Pneumonia 08/13/2014   Thyroid disease      Family History  Problem Relation Age of Onset   Diabetes Mellitus II Mother    Stroke Mother    Diabetes Mellitus II Sister    Hypertension Sister      Current Outpatient Medications:    Accu-Chek FastClix Lancets MISC, AS DIRECTED TWICE A DAY SUBCUTANEOUS 90 DAYS E11.9, Disp: 204 each, Rfl: 4   ACCU-CHEK GUIDE test strip, USE TO CHECK BLOOD SUGARS TWICE DAILY E11.69, Disp: 100 strip, Rfl: 2   Ascorbic Acid (VITAMIN C) 1000 MG tablet, Take 1,000 mg by mouth daily., Disp: , Rfl:    aspirin 81 MG tablet, Take 81 mg by mouth daily., Disp: , Rfl:    atorvastatin (LIPITOR) 20 MG tablet, TAKE 1 TABLET BY MOUTH EVERY DAY, Disp: 90 tablet, Rfl: 1   calcium citrate-vitamin D (CITRACAL+D) 315-200 MG-UNIT tablet, Take 1 tablet by mouth 2 (two) times daily., Disp: , Rfl:    Cyanocobalamin (VITAMIN B 12 PO), Take 1,000 mg by mouth daily., Disp: , Rfl:    dapagliflozin propanediol (FARXIGA) 10 MG TABS tablet, Take 1 tablet (10 mg total) by mouth daily before breakfast. Please d/c Farxiga 26m from the pt's profile.  Thanks., Disp:  90 tablet, Rfl: 1   Dulaglutide (TRULICITY) 3 BX/0.3YB SOPN, Inject 3 mg as directed once a week., Disp: 12 mL, Rfl: 1   ferrous sulfate 325 (65 FE) MG tablet, Take 1 tablet (325 mg total) by mouth 2 (two) times daily., Disp: 180 tablet, Rfl: 1   hydrocortisone (ANUSOL-HC) 2.5 % rectal cream, Insert 1 application rectally 2 times daily., Disp: 30 g,  Rfl: 2   Multiple Vitamin (MULTIVITAMIN WITH MINERALS) TABS tablet, Take 1 tablet by mouth daily., Disp: , Rfl:    Omega-3 Fatty Acids (FISH OIL) 1000 MG CAPS, Take 1 capsule by mouth daily. , Disp: , Rfl:    Wheat Dextrin (BENEFIBER DRINK MIX PO), Take by mouth., Disp: , Rfl:    atorvastatin (LIPITOR) 10 MG tablet, TAKE 1 TABLET BY MOUTH EVERY DAY, Disp: 90 tablet, Rfl: 0   beta carotene w/minerals (OCUVITE) tablet, Take 1 tablet by mouth daily. (Patient not taking: Reported on 08/17/2022), Disp: , Rfl:    levothyroxine (SYNTHROID) 112 MCG tablet, TAKE 1 TABLET BY MOUTH EVERY DAY BEFORE BREAKFAST, Disp: 90 tablet, Rfl: 0   lisinopril (ZESTRIL) 2.5 MG tablet, TAKE 1 TABLET BY MOUTH EVERY DAY, Disp: 90 tablet, Rfl: 1   mupirocin ointment (BACTROBAN) 2 %, APPLY TO AFFECTED AREA TWICE A DAY, Disp: 22 g, Rfl: 0   No Known Allergies    The patient states she is post menopausal status.  No LMP recorded. Patient is postmenopausal.. Negative for Dysmenorrhea and Negative for Menorrhagia. Negative for: breast discharge, breast lump(s), breast pain and breast self exam. Associated symptoms include abnormal vaginal bleeding. Pertinent negatives include abnormal bleeding (hematology), anxiety, decreased libido, depression, difficulty falling sleep, dyspareunia, history of infertility, nocturia, sexual dysfunction, sleep disturbances, urinary incontinence, urinary urgency, vaginal discharge and vaginal itching. Diet: diabetic diet she will eat some fruits and berries. The patient states her exercise level is minimal - she is taking care of her husband. She does do some yard work.    The patient's tobacco use is:  Social History   Tobacco Use  Smoking Status Never  Smokeless Tobacco Never   She has been exposed to passive smoke. The patient's alcohol use is:  Social History   Substance and Sexual Activity  Alcohol Use No    Review of Systems  Constitutional: Negative.  Negative for fatigue.  HENT:  Negative.    Eyes: Negative.  Negative for blurred vision.  Respiratory: Negative.    Cardiovascular: Negative.  Negative for chest pain and palpitations.  Gastrointestinal: Negative.   Endocrine: Negative.  Negative for polydipsia, polyphagia and polyuria.  Genitourinary: Negative.   Musculoskeletal: Negative.   Skin: Negative.   Allergic/Immunologic: Negative.   Neurological: Negative.  Negative for dizziness and headaches.  Hematological: Negative.   Psychiatric/Behavioral: Negative.  Negative for confusion. The patient is not nervous/anxious.      Today's Vitals   08/17/22 1039  BP: 90/62  Pulse: 81  Weight: 154 lb 3.2 oz (69.9 kg)  Height: '5\' 5"'  (1.651 m)   Body mass index is 25.66 kg/m.   Objective:  Physical Exam Vitals reviewed.  Constitutional:      General: She is not in acute distress.    Appearance: Normal appearance. She is well-developed.  HENT:     Head: Normocephalic and atraumatic.     Right Ear: Hearing, tympanic membrane, ear canal and external ear normal. There is no impacted cerumen.     Left Ear: Hearing, tympanic membrane, ear canal and external ear normal.  There is no impacted cerumen.     Nose:     Comments: Deferred - maksed    Mouth/Throat:     Comments: Deferred - masked Eyes:     General: Lids are normal.     Extraocular Movements: Extraocular movements intact.     Conjunctiva/sclera: Conjunctivae normal.     Pupils: Pupils are equal, round, and reactive to light.     Funduscopic exam:    Right eye: No papilledema.        Left eye: No papilledema.  Neck:     Thyroid: No thyroid mass.     Vascular: No carotid bruit.  Cardiovascular:     Rate and Rhythm: Normal rate and regular rhythm.     Pulses: Normal pulses.     Heart sounds: Normal heart sounds. No murmur heard. Pulmonary:     Effort: Pulmonary effort is normal. No respiratory distress.     Breath sounds: Normal breath sounds. No wheezing.  Abdominal:     General: Abdomen is  flat. Bowel sounds are normal. There is no distension.     Palpations: Abdomen is soft.     Tenderness: There is no abdominal tenderness.  Musculoskeletal:        General: No swelling. Normal range of motion.     Cervical back: Full passive range of motion without pain, normal range of motion and neck supple.     Right lower leg: No edema.     Left lower leg: No edema.  Skin:    General: Skin is warm and dry.     Capillary Refill: Capillary refill takes less than 2 seconds.  Neurological:     General: No focal deficit present.     Mental Status: She is alert and oriented to person, place, and time.     Cranial Nerves: No cranial nerve deficit.     Sensory: No sensory deficit.     Motor: No weakness.  Psychiatric:        Mood and Affect: Mood normal.        Behavior: Behavior normal.        Thought Content: Thought content normal.        Judgment: Judgment normal.         Assessment And Plan:     1. Encounter for annual physical exam Behavior modifications discussed and diet history reviewed.   Pt will continue to exercise regularly and modify diet with low GI, plant based foods and decrease intake of processed foods.  Recommend intake of daily multivitamin, Vitamin D, and calcium.  Recommend mammogram and colonoscopy for preventive screenings, as well as recommend immunizations that include influenza, TDAP, and Shingles (these are up to date)  2. Type 2 diabetes mellitus with stage 3a chronic kidney disease, without long-term current use of insulin (HCC) Comments: Improving, continue current medications. Discussed importance of good kidney health and stability. Diabetes foot exam done.   - EKG 12-Lead - POCT Urinalysis Dipstick (81002) - Microalbumin / Creatinine Urine Ratio - Hemoglobin A1c  3. Essential hypertension Comments: Blood pressure is well controlled, continue current medications - CMP14+EGFR  4. Acquired hypothyroidism Comments: Stable, continue current  medications - TSH + free T4  5. Mixed hyperlipidemia Comments: Cholesterol levels are stable. Continue current medications.  - Lipid panel  6. Abnormal ankle brachial index (ABI) Comments: She had an abnormal screening for PAD with [facility] NP home visit.  She has intermittent tingling to her feet at times. Will refer to Vascular -  Ambulatory referral to Vascular Surgery  7. Other long term (current) drug therapy - CBC  8. Need for zoster vaccination Trans Rx done and 2nd Shingrix given - Zoster Recombinant (Shingrix )   Patient was given opportunity to ask questions. Patient verbalized understanding of the plan and was able to repeat key elements of the plan. All questions were answered to their satisfaction.   Catherine Brine, FNP   I, Catherine Brine, FNP, have reviewed all documentation for this visit. The documentation on 08/21/22 for the exam, diagnosis, procedures, and orders are all accurate and complete.   THE PATIENT IS ENCOURAGED TO PRACTICE SOCIAL DISTANCING DUE TO THE COVID-19 PANDEMIC.

## 2022-08-17 NOTE — Patient Instructions (Signed)

## 2022-08-18 LAB — HEMOGLOBIN A1C
Est. average glucose Bld gHb Est-mCnc: 148 mg/dL
Hgb A1c MFr Bld: 6.8 % — ABNORMAL HIGH (ref 4.8–5.6)

## 2022-08-18 LAB — CMP14+EGFR
ALT: 84 IU/L — ABNORMAL HIGH (ref 0–32)
AST: 36 IU/L (ref 0–40)
Albumin/Globulin Ratio: 2.5 — ABNORMAL HIGH (ref 1.2–2.2)
Albumin: 5 g/dL — ABNORMAL HIGH (ref 3.8–4.8)
Alkaline Phosphatase: 80 IU/L (ref 44–121)
BUN/Creatinine Ratio: 18 (ref 12–28)
BUN: 25 mg/dL (ref 8–27)
Bilirubin Total: 0.7 mg/dL (ref 0.0–1.2)
CO2: 24 mmol/L (ref 20–29)
Calcium: 10.4 mg/dL — ABNORMAL HIGH (ref 8.7–10.3)
Chloride: 100 mmol/L (ref 96–106)
Creatinine, Ser: 1.36 mg/dL — ABNORMAL HIGH (ref 0.57–1.00)
Globulin, Total: 2 g/dL (ref 1.5–4.5)
Glucose: 122 mg/dL — ABNORMAL HIGH (ref 70–99)
Potassium: 4.4 mmol/L (ref 3.5–5.2)
Sodium: 139 mmol/L (ref 134–144)
Total Protein: 7 g/dL (ref 6.0–8.5)
eGFR: 41 mL/min/{1.73_m2} — ABNORMAL LOW (ref >59)

## 2022-08-18 LAB — CBC
Hematocrit: 38.2 % (ref 34.0–46.6)
Hemoglobin: 13.1 g/dL (ref 11.1–15.9)
MCH: 30.9 pg (ref 26.6–33.0)
MCHC: 34.3 g/dL (ref 31.5–35.7)
MCV: 90 fL (ref 79–97)
Platelets: 192 10*3/uL (ref 150–450)
RBC: 4.24 x10E6/uL (ref 3.77–5.28)
RDW: 12.6 % (ref 11.7–15.4)
WBC: 4.1 10*3/uL (ref 3.4–10.8)

## 2022-08-18 LAB — MICROALBUMIN / CREATININE URINE RATIO
Creatinine, Urine: 116.5 mg/dL
Microalb/Creat Ratio: 3 mg/g creat (ref 0–29)
Microalbumin, Urine: 4 ug/mL

## 2022-08-18 LAB — LIPID PANEL
Chol/HDL Ratio: 2.2 ratio (ref 0.0–4.4)
Cholesterol, Total: 145 mg/dL (ref 100–199)
HDL: 66 mg/dL (ref >39)
LDL Chol Calc (NIH): 64 mg/dL (ref 0–99)
Triglycerides: 77 mg/dL (ref 0–149)
VLDL Cholesterol Cal: 15 mg/dL (ref 5–40)

## 2022-08-18 LAB — TSH+FREE T4
Free T4: 1.66 ng/dL (ref 0.82–1.77)
TSH: 0.911 u[IU]/mL (ref 0.450–4.500)

## 2022-08-19 ENCOUNTER — Other Ambulatory Visit: Payer: Self-pay | Admitting: Nurse Practitioner

## 2022-08-19 DIAGNOSIS — E039 Hypothyroidism, unspecified: Secondary | ICD-10-CM

## 2022-08-19 DIAGNOSIS — L0292 Furuncle, unspecified: Secondary | ICD-10-CM

## 2022-08-19 DIAGNOSIS — I10 Essential (primary) hypertension: Secondary | ICD-10-CM

## 2022-08-26 ENCOUNTER — Encounter: Payer: Self-pay | Admitting: Vascular Surgery

## 2022-08-26 ENCOUNTER — Ambulatory Visit (INDEPENDENT_AMBULATORY_CARE_PROVIDER_SITE_OTHER): Payer: Medicare Other | Admitting: Vascular Surgery

## 2022-08-26 VITALS — BP 126/70 | HR 75 | Temp 97.9°F | Resp 20 | Ht 65.0 in | Wt 154.3 lb

## 2022-08-26 DIAGNOSIS — R6889 Other general symptoms and signs: Secondary | ICD-10-CM | POA: Diagnosis not present

## 2022-08-26 NOTE — Progress Notes (Signed)
Patient ID: Catherine [name], female   DOB: 1946/10/18, 76 y.o.   MRN: [id number]  Reason for Consult: New Patient (Initial Visit)   Referred by [name], FNP  Subjective:     HPI:  Catherine [name] is a 76 y.o. female with history of diabetes and hypertension and recently had some coolness to her feet and had home ABIs performed which were reportedly abnormal.  She walks without limitation.  She denies any history of stroke, TIA or amaurosis.  She denies any personal or family history of aneurysm disease but has never specifically discussed that with her family.  She does take aspirin and a statin daily.  Past Medical History:  Diagnosis Date   CAP (community acquired pneumonia)--treated 08/23/2014   Diabetes mellitus without complication (HCC)    DKA (diabetic ketoacidoses) 08/13/2014   Hypertension    Pneumonia 08/13/2014   Thyroid disease    Family History  Problem Relation Age of Onset   Diabetes Mellitus II Mother    Stroke Mother    Diabetes Mellitus II Sister    Hypertension Sister    Past Surgical History:  Procedure Laterality Date   CHOLECYSTECTOMY      Short Social History:  Social History   Tobacco Use   Smoking status: Never   Smokeless tobacco: Never  Substance Use Topics   Alcohol use: No    No Known Allergies  Current Outpatient Medications  Medication Sig Dispense Refill   Accu-Chek FastClix Lancets MISC AS DIRECTED TWICE A DAY SUBCUTANEOUS 90 DAYS E11.9 204 each 4   ACCU-CHEK GUIDE test strip USE TO CHECK BLOOD SUGARS TWICE DAILY E11.69 100 strip 2   Ascorbic Acid (VITAMIN C) 1000 MG tablet Take 1,000 mg by mouth daily.     aspirin 81 MG tablet Take 81 mg by mouth daily.     atorvastatin (LIPITOR) 10 MG tablet TAKE 1 TABLET BY MOUTH EVERY DAY 90 tablet 0   atorvastatin (LIPITOR) 20 MG tablet TAKE 1 TABLET BY MOUTH EVERY DAY 90 tablet 1   beta carotene w/minerals (OCUVITE) tablet Take 1 tablet by mouth daily.     calcium citrate-vitamin D  (CITRACAL+D) 315-200 MG-UNIT tablet Take 1 tablet by mouth 2 (two) times daily.     Cyanocobalamin (VITAMIN B 12 PO) Take 1,000 mg by mouth daily.     dapagliflozin propanediol (FARXIGA) 10 MG TABS tablet Take 1 tablet (10 mg total) by mouth daily before breakfast. Please d/c Farxiga 5mg  from the pt's profile.  Thanks. 90 tablet 1   Dulaglutide (TRULICITY) 3 MG/0.5ML SOPN Inject 3 mg as directed once a week. 12 mL 1   ferrous sulfate 325 (65 FE) MG tablet Take 1 tablet (325 mg total) by mouth 2 (two) times daily. 180 tablet 1   hydrocortisone (ANUSOL-HC) 2.5 % rectal cream Insert 1 application rectally 2 times daily. 30 g 2   levothyroxine (SYNTHROID) 112 MCG tablet TAKE 1 TABLET BY MOUTH EVERY DAY BEFORE BREAKFAST 90 tablet 0   lisinopril (ZESTRIL) 2.5 MG tablet TAKE 1 TABLET BY MOUTH EVERY DAY 90 tablet 1   Multiple Vitamin (MULTIVITAMIN WITH MINERALS) TABS tablet Take 1 tablet by mouth daily.     mupirocin ointment (BACTROBAN) 2 % APPLY TO AFFECTED AREA TWICE A DAY 22 g 0   Omega-3 Fatty Acids (FISH OIL) 1000 MG CAPS Take 1 capsule by mouth daily.      Wheat Dextrin (BENEFIBER DRINK MIX PO) Take by mouth.     No  current facility-administered medications for this visit.    Review of Systems  Constitutional:  Constitutional negative. HENT: HENT negative.  Eyes: Eyes negative.  Respiratory: Respiratory negative.  Cardiovascular: Cardiovascular negative.  GI: Gastrointestinal negative.  Musculoskeletal: Musculoskeletal negative.  Skin: Skin negative.  Neurological: Neurological negative. Hematologic: Hematologic/lymphatic negative.  Psychiatric: Psychiatric negative.        Objective:  Objective   Vitals:   08/26/22 1349  BP: 126/70  Pulse: 75  Resp: 20  Temp: 97.9 F (36.6 C)  SpO2: 98%  Weight: 154 lb 4.8 oz (70 kg)  Height: 5\' 5"  (1.651 m)   Body mass index is 25.68 kg/m.  Physical Exam HENT:     Head: Normocephalic.     Nose: Nose normal.  Eyes:     Pupils:  Pupils are equal, round, and reactive to light.  Cardiovascular:     Rate and Rhythm: Normal rate.     Pulses:          Dorsalis pedis pulses are 2+ on the left side.       Posterior tibial pulses are 2+ on the right side and 2+ on the left side.     Heart sounds: Murmur heard.  Pulmonary:     Effort: Pulmonary effort is normal.  Abdominal:     General: Abdomen is flat.     Palpations: Abdomen is soft.  Musculoskeletal:     Cervical back: Normal range of motion and neck supple.  Skin:    Capillary Refill: Capillary refill takes less than 2 seconds.  Neurological:     General: No focal deficit present.     Mental Status: She is alert.  Psychiatric:        Mood and Affect: Mood normal.     Data: No studies     Assessment/Plan:     76 year old female with asymptomatic abnormal ABIs at home but with palpable pulses on exam and without any issue requiring intervention.  We have discussed the need to protect her feet and continue walking and she can see me on an as-needed basis.     Waynetta Sandy MD [facility]

## 2022-08-28 DIAGNOSIS — N1832 Chronic kidney disease, stage 3b: Secondary | ICD-10-CM | POA: Diagnosis not present

## 2022-08-28 DIAGNOSIS — I129 Hypertensive chronic kidney disease with stage 1 through stage 4 chronic kidney disease, or unspecified chronic kidney disease: Secondary | ICD-10-CM | POA: Diagnosis not present

## 2022-08-28 DIAGNOSIS — E1122 Type 2 diabetes mellitus with diabetic chronic kidney disease: Secondary | ICD-10-CM | POA: Diagnosis not present

## 2022-08-28 DIAGNOSIS — E785 Hyperlipidemia, unspecified: Secondary | ICD-10-CM | POA: Diagnosis not present

## 2022-09-01 ENCOUNTER — Ambulatory Visit: Payer: Medicare Other

## 2022-09-03 ENCOUNTER — Other Ambulatory Visit: Payer: Self-pay

## 2022-09-03 ENCOUNTER — Ambulatory Visit: Payer: Medicare Other

## 2022-09-03 VITALS — BP 112/76 | HR 75 | Temp 98.1°F | Ht 65.0 in | Wt 154.0 lb

## 2022-09-03 DIAGNOSIS — R748 Abnormal levels of other serum enzymes: Secondary | ICD-10-CM

## 2022-09-03 DIAGNOSIS — Z23 Encounter for immunization: Secondary | ICD-10-CM

## 2022-09-03 NOTE — Progress Notes (Unsigned)
Pt presents today for HD flu shot.

## 2022-09-03 NOTE — Progress Notes (Signed)
Patient presents today for high dose flu shot.

## 2022-09-17 ENCOUNTER — Other Ambulatory Visit: Payer: Medicare Other

## 2022-09-17 DIAGNOSIS — R748 Abnormal levels of other serum enzymes: Secondary | ICD-10-CM

## 2022-09-18 LAB — HEPATIC FUNCTION PANEL
ALT: 32 IU/L (ref 0–32)
AST: 23 IU/L (ref 0–40)
Albumin: 4.8 g/dL (ref 3.8–4.8)
Alkaline Phosphatase: 80 IU/L (ref 44–121)
Bilirubin Total: 0.6 mg/dL (ref 0.0–1.2)
Bilirubin, Direct: 0.16 mg/dL (ref 0.00–0.40)
Total Protein: 7.2 g/dL (ref 6.0–8.5)

## 2022-10-05 ENCOUNTER — Telehealth: Payer: Self-pay

## 2022-10-05 NOTE — Progress Notes (Signed)
10-05-2022: Patient called had a a question about trulicity application. Patient will turn in application once 2024 SSI statement is mailed out.  [name] [facility] Clinical Pharmacist Assistant [phone]

## 2022-10-16 ENCOUNTER — Other Ambulatory Visit: Payer: Self-pay | Admitting: Nurse Practitioner

## 2022-10-16 DIAGNOSIS — E119 Type 2 diabetes mellitus without complications: Secondary | ICD-10-CM

## 2022-10-22 DIAGNOSIS — Z1231 Encounter for screening mammogram for malignant neoplasm of breast: Secondary | ICD-10-CM | POA: Diagnosis not present

## 2022-10-22 LAB — HM MAMMOGRAPHY: HM Mammogram: NORMAL (ref 0–4)

## 2022-11-24 ENCOUNTER — Encounter: Payer: Self-pay | Admitting: Nurse Practitioner

## 2022-11-24 ENCOUNTER — Ambulatory Visit (INDEPENDENT_AMBULATORY_CARE_PROVIDER_SITE_OTHER): Payer: Medicare Other | Admitting: Nurse Practitioner

## 2022-11-24 VITALS — BP 110/78 | HR 78 | Temp 98.1°F | Ht 65.0 in | Wt 159.2 lb

## 2022-11-24 DIAGNOSIS — E1122 Type 2 diabetes mellitus with diabetic chronic kidney disease: Secondary | ICD-10-CM

## 2022-11-24 DIAGNOSIS — I129 Hypertensive chronic kidney disease with stage 1 through stage 4 chronic kidney disease, or unspecified chronic kidney disease: Secondary | ICD-10-CM

## 2022-11-24 DIAGNOSIS — I10 Essential (primary) hypertension: Secondary | ICD-10-CM

## 2022-11-24 DIAGNOSIS — N1831 Chronic kidney disease, stage 3a: Secondary | ICD-10-CM

## 2022-11-24 DIAGNOSIS — E039 Hypothyroidism, unspecified: Secondary | ICD-10-CM | POA: Diagnosis not present

## 2022-11-24 DIAGNOSIS — Z6826 Body mass index (BMI) 26.0-26.9, adult: Secondary | ICD-10-CM

## 2022-11-24 DIAGNOSIS — Z79899 Other long term (current) drug therapy: Secondary | ICD-10-CM

## 2022-11-24 DIAGNOSIS — E782 Mixed hyperlipidemia: Secondary | ICD-10-CM | POA: Diagnosis not present

## 2022-11-24 NOTE — Progress Notes (Signed)
I,[name],acting as a Neurosurgeon for Catherine Felts, FNP.,have documented all relevant documentation on the behalf of Catherine Felts, FNP,as directed by  Catherine Felts, FNP while in the presence of Catherine Felts, FNP.    Subjective:     Patient ID: Catherine [name] , female    DOB: 1946/04/26 , 77 y.o.   MRN: [id number]   Chief Complaint  Patient presents with   Diabetes   Hypertension   Hypothyroidism   Hyperlipidemia    HPI  Patient presents today for bp and diabetes check. Patient has no other issues. Denies dizziness, SOB, headache. She is no longer taking the eye vitamin. She has been to the Nephrologist  Mammogram completed on 10/22/22.   Wt Readings from Last 3 Encounters: 11/24/22 : 159 lb 3.2 oz (72.2 kg) 09/03/22 : 154 lb (69.9 kg) 08/26/22 : 154 lb 4.8 oz (70 kg)    Hypertension This is a chronic problem. The current episode started more than 1 year ago. The problem is controlled. Pertinent negatives include no anxiety or headaches. There are no associated agents to hypertension.  Diabetes She presents for her follow-up diabetic visit. She has type 2 diabetes mellitus. Pertinent negatives for hypoglycemia include no dizziness or headaches. There are no diabetic associated symptoms. There are no hypoglycemic complications. There are no diabetic complications. Risk factors for coronary artery disease include sedentary lifestyle, hypertension, diabetes mellitus and dyslipidemia. Current diabetic treatment includes oral agent (dual therapy). She is following a diabetic diet. When asked about meal planning, she reported none. She has not had a previous visit with a dietitian. She participates in exercise every other day. Her home blood glucose trend is decreasing steadily. (Blood sugar 121-155, tolerating medications well) An ACE inhibitor/angiotensin II receptor blocker is being taken. She does not see a podiatrist.Eye exam is not current (She will schedule an appt August  18th).     Past Medical History:  Diagnosis Date   CAP (community acquired pneumonia)--treated 08/23/2014   Diabetes mellitus without complication (HCC)    DKA (diabetic ketoacidoses) 08/13/2014   Hypertension    Pneumonia 08/13/2014   Thyroid disease      Family History  Problem Relation Age of Onset   Diabetes Mellitus II Mother    Stroke Mother    Diabetes Mellitus II Sister    Hypertension Sister      Current Outpatient Medications:    Accu-Chek FastClix Lancets MISC, AS DIRECTED TWICE A DAY SUBCUTANEOUS 90 DAYS E11.9, Disp: 204 each, Rfl: 4   ACCU-CHEK GUIDE test strip, USE TO CHECK BLOOD SUGARS TWICE DAILY E11.69, Disp: 100 strip, Rfl: 2   Ascorbic Acid (VITAMIN C) 1000 MG tablet, Take 1,000 mg by mouth daily., Disp: , Rfl:    aspirin 81 MG tablet, Take 81 mg by mouth daily., Disp: , Rfl:    atorvastatin (LIPITOR) 10 MG tablet, TAKE 1 TABLET BY MOUTH EVERY DAY, Disp: 90 tablet, Rfl: 0   atorvastatin (LIPITOR) 20 MG tablet, TAKE 1 TABLET BY MOUTH EVERY DAY, Disp: 90 tablet, Rfl: 1   beta carotene w/minerals (OCUVITE) tablet, Take 1 tablet by mouth daily., Disp: , Rfl:    calcium citrate-vitamin D (CITRACAL+D) 315-200 MG-UNIT tablet, Take 1 tablet by mouth 2 (two) times daily., Disp: , Rfl:    Cyanocobalamin (VITAMIN B 12 PO), Take 1,000 mg by mouth daily., Disp: , Rfl:    Dulaglutide (TRULICITY) 3 MG/0.5ML SOPN, Inject 3 mg as directed once a week., Disp: 12 mL, Rfl: 1  FARXIGA 10 MG TABS tablet, TAKE 1 TABLET (10 MG TOTAL) BY MOUTH DAILY BEFORE BREAKFAST, Disp: 90 tablet, Rfl: 1   ferrous sulfate 325 (65 FE) MG tablet, Take 1 tablet (325 mg total) by mouth 2 (two) times daily., Disp: 180 tablet, Rfl: 1   hydrocortisone (ANUSOL-HC) 2.5 % rectal cream, Insert 1 application rectally 2 times daily., Disp: 30 g, Rfl: 2   levothyroxine (SYNTHROID) 112 MCG tablet, TAKE 1 TABLET BY MOUTH EVERY DAY BEFORE BREAKFAST, Disp: 90 tablet, Rfl: 0   lisinopril (ZESTRIL) 2.5 MG tablet, TAKE 1  TABLET BY MOUTH EVERY DAY, Disp: 90 tablet, Rfl: 1   Multiple Vitamin (MULTIVITAMIN WITH MINERALS) TABS tablet, Take 1 tablet by mouth daily., Disp: , Rfl:    mupirocin ointment (BACTROBAN) 2 %, APPLY TO AFFECTED AREA TWICE A DAY, Disp: 22 g, Rfl: 0   Omega-3 Fatty Acids (FISH OIL) 1000 MG CAPS, Take 1 capsule by mouth daily. , Disp: , Rfl:    Wheat Dextrin (BENEFIBER DRINK MIX PO), Take by mouth., Disp: , Rfl:    No Known Allergies   Review of Systems  Constitutional: Negative.   Respiratory: Negative.    Cardiovascular: Negative.   Neurological: Negative.  Negative for dizziness and headaches.  Psychiatric/Behavioral: Negative.       Today's Vitals   11/24/22 1025  BP: 110/78  Pulse: 78  Temp: 98.1 F (36.7 C)  SpO2: 98%  Weight: 159 lb 3.2 oz (72.2 kg)  Height: 5\' 5"  (1.651 m)   Body mass index is 26.49 kg/m.  Wt Readings from Last 3 Encounters:  11/24/22 159 lb 3.2 oz (72.2 kg)  09/03/22 154 lb (69.9 kg)  08/26/22 154 lb 4.8 oz (70 kg)    Objective:  Physical Exam Vitals reviewed.  Constitutional:      General: She is not in acute distress.    Appearance: Normal appearance. She is normal weight.  Cardiovascular:     Rate and Rhythm: Normal rate and regular rhythm.     Pulses: Normal pulses.     Heart sounds: Normal heart sounds. No murmur heard. Pulmonary:     Effort: Pulmonary effort is normal. No respiratory distress.     Breath sounds: Normal breath sounds. No wheezing.  Skin:    General: Skin is warm and dry.     Capillary Refill: Capillary refill takes less than 2 seconds.  Neurological:     General: No focal deficit present.     Mental Status: She is alert and oriented to person, place, and time.     Cranial Nerves: No cranial nerve deficit.     Motor: No weakness.  Psychiatric:        Mood and Affect: Mood normal.        Behavior: Behavior normal.        Thought Content: Thought content normal.        Judgment: Judgment normal.          Assessment And Plan:     1. Essential hypertension Comments: Blood pressure is well-controlled.  Continue current medications  2. Type 2 diabetes mellitus with stage 3a chronic kidney disease, without long-term current use of insulin (HCC) Comments: Hemoglobin A1c is improving.  Continue current medications.  No significant low readings or high readings - CMP14+EGFR - Hemoglobin A1c  3. Acquired hypothyroidism Comments: Stable, continue current medications will make changes pending lab results - TSH + free T4  4. Mixed hyperlipidemia Comments: Continue statin, tolerating well.  Encouraged to  eat a low-fat diet - CMP14+EGFR - Lipid panel  5. Body mass index (BMI) 26.0-26.9, adult  6. Other long term (current) drug therapy - CBC     Patient was given opportunity to ask questions. Patient verbalized understanding of the plan and was able to repeat key elements of the plan. All questions were answered to their satisfaction.  Catherine Felts, FNP   I, Catherine Felts, FNP, have reviewed all documentation for this visit. The documentation on 11/24/22 for the exam, diagnosis, procedures, and orders are all accurate and complete.   IF YOU HAVE BEEN REFERRED TO A SPECIALIST, IT MAY TAKE 1-2 WEEKS TO SCHEDULE/PROCESS THE REFERRAL. IF YOU HAVE NOT HEARD FROM US/SPECIALIST IN TWO WEEKS, PLEASE GIVE [place] A CALL AT [phone].   THE PATIENT IS ENCOURAGED TO PRACTICE SOCIAL DISTANCING DUE TO THE COVID-19 PANDEMIC.

## 2022-11-24 NOTE — Patient Instructions (Signed)
Hypertension, Adult ?Hypertension is another name for high blood pressure. High blood pressure forces your heart to work harder to pump blood. This can cause problems over time. ?There are two numbers in a blood pressure reading. There is a top number (systolic) over a bottom number (diastolic). It is best to have a blood pressure that is below 120/80. ?What are the causes? ?The cause of this condition is not known. Some other conditions can lead to high blood pressure. ?What increases the risk? ?Some lifestyle factors can make you more likely to develop high blood pressure: ?Smoking. ?Not getting enough exercise or physical activity. ?Being overweight. ?Having too much fat, sugar, calories, or salt (sodium) in your diet. ?Drinking too much alcohol. ?Other risk factors include: ?Having any of these conditions: ?Heart disease. ?Diabetes. ?High cholesterol. ?Kidney disease. ?Obstructive sleep apnea. ?Having a family history of high blood pressure and high cholesterol. ?Age. The risk increases with age. ?Stress. ?What are the signs or symptoms? ?High blood pressure may not cause symptoms. Very high blood pressure (hypertensive crisis) may cause: ?Headache. ?Fast or uneven heartbeats (palpitations). ?Shortness of breath. ?Nosebleed. ?Vomiting or feeling like you may vomit (nauseous). ?Changes in how you see. ?Very bad chest pain. ?Feeling dizzy. ?Seizures. ?How is this treated? ?This condition is treated by making healthy lifestyle changes, such as: ?Eating healthy foods. ?Exercising more. ?Drinking less alcohol. ?Your doctor may prescribe medicine if lifestyle changes do not help enough and if: ?Your top number is above 130. ?Your bottom number is above 80. ?Your personal target blood pressure may vary. ?Follow these instructions at home: ?Eating and drinking ? ?If told, follow the DASH eating plan. To follow this plan: ?Fill one half of your plate at each meal with fruits and vegetables. ?Fill one fourth of your plate  at each meal with whole grains. Whole grains include whole-wheat pasta, brown rice, and whole-grain bread. ?Eat or drink low-fat dairy products, such as skim milk or low-fat yogurt. ?Fill one fourth of your plate at each meal with low-fat (lean) proteins. Low-fat proteins include fish, chicken without skin, eggs, beans, and tofu. ?Avoid fatty meat, cured and processed meat, or chicken with skin. ?Avoid pre-made or processed food. ?Limit the amount of salt in your diet to less than 1,500 mg each day. ?Do not drink alcohol if: ?Your doctor tells you not to drink. ?You are pregnant, may be pregnant, or are planning to become pregnant. ?If you drink alcohol: ?Limit how much you have to: ?0-1 drink a day for women. ?0-2 drinks a day for men. ?Know how much alcohol is in your drink. In the [place] one drink equals one 12 oz bottle of beer (355 mL), one 5 oz glass of wine (148 mL), or one 1? oz glass of hard liquor (44 mL). ?Lifestyle ? ?Work with your doctor to stay at a healthy weight or to lose weight. Ask your doctor what the best weight is for you. ?Get at least 30 minutes of exercise that causes your heart to beat faster (aerobic exercise) most days of the week. This may include walking, swimming, or biking. ?Get at least 30 minutes of exercise that strengthens your muscles (resistance exercise) at least 3 days a week. This may include lifting weights or doing Pilates. ?Do not smoke or use any products that contain nicotine or tobacco. If you need help quitting, ask your doctor. ?Check your blood pressure at home as told by your doctor. ?Keep all follow-up visits. ?Medicines ?Take over-the-counter and prescription medicines   only as told by your doctor. Follow directions carefully. ?Do not skip doses of blood pressure medicine. The medicine does not work as well if you skip doses. Skipping doses also puts you at risk for problems. ?Ask your doctor about side effects or reactions to medicines that you should watch  for. ?Contact a doctor if: ?You think you are having a reaction to the medicine you are taking. ?You have headaches that keep coming back. ?You feel dizzy. ?You have swelling in your ankles. ?You have trouble with your vision. ?Get help right away if: ?You get a very bad headache. ?You start to feel mixed up (confused). ?You feel weak or numb. ?You feel faint. ?You have very bad pain in your: ?Chest. ?Belly (abdomen). ?You vomit more than once. ?You have trouble breathing. ?These symptoms may be an emergency. Get help right away. Call 911. ?Do not wait to see if the symptoms will go away. ?Do not drive yourself to the hospital. ?Summary ?Hypertension is another name for high blood pressure. ?High blood pressure forces your heart to work harder to pump blood. ?For most people, a normal blood pressure is less than 120/80. ?Making healthy choices can help lower blood pressure. If your blood pressure does not get lower with healthy choices, you may need to take medicine. ?This information is not intended to replace advice given to you by your health care provider. Make sure you discuss any questions you have with your health care provider. ?Document Revised: 08/21/2021 Document Reviewed: 08/21/2021 ?Elsevier Patient Education ? 2023 [employer] ? ?

## 2022-11-25 LAB — CMP14+EGFR
ALT: 27 IU/L (ref 0–32)
AST: 20 IU/L (ref 0–40)
Albumin/Globulin Ratio: 2 (ref 1.2–2.2)
Albumin: 4.7 g/dL (ref 3.8–4.8)
Alkaline Phosphatase: 77 IU/L (ref 44–121)
BUN/Creatinine Ratio: 21 (ref 12–28)
BUN: 31 mg/dL — ABNORMAL HIGH (ref 8–27)
Bilirubin Total: 0.6 mg/dL (ref 0.0–1.2)
CO2: 20 mmol/L (ref 20–29)
Calcium: 10.4 mg/dL — ABNORMAL HIGH (ref 8.7–10.3)
Chloride: 102 mmol/L (ref 96–106)
Creatinine, Ser: 1.45 mg/dL — ABNORMAL HIGH (ref 0.57–1.00)
Globulin, Total: 2.3 g/dL (ref 1.5–4.5)
Glucose: 175 mg/dL — ABNORMAL HIGH (ref 70–99)
Potassium: 4.7 mmol/L (ref 3.5–5.2)
Sodium: 139 mmol/L (ref 134–144)
Total Protein: 7 g/dL (ref 6.0–8.5)
eGFR: 38 mL/min/{1.73_m2} — ABNORMAL LOW (ref >59)

## 2022-11-25 LAB — CBC
Hematocrit: 37.9 % (ref 34.0–46.6)
Hemoglobin: 12.8 g/dL (ref 11.1–15.9)
MCH: 30.7 pg (ref 26.6–33.0)
MCHC: 33.8 g/dL (ref 31.5–35.7)
MCV: 91 fL (ref 79–97)
Platelets: 208 10*3/uL (ref 150–450)
RBC: 4.17 x10E6/uL (ref 3.77–5.28)
RDW: 12.6 % (ref 11.7–15.4)
WBC: 3.8 10*3/uL (ref 3.4–10.8)

## 2022-11-25 LAB — LIPID PANEL
Chol/HDL Ratio: 3.8 ratio (ref 0.0–4.4)
Cholesterol, Total: 249 mg/dL — ABNORMAL HIGH (ref 100–199)
HDL: 66 mg/dL (ref >39)
LDL Chol Calc (NIH): 164 mg/dL — ABNORMAL HIGH (ref 0–99)
Triglycerides: 111 mg/dL (ref 0–149)
VLDL Cholesterol Cal: 19 mg/dL (ref 5–40)

## 2022-11-25 LAB — HEMOGLOBIN A1C
Est. average glucose Bld gHb Est-mCnc: 146 mg/dL
Hgb A1c MFr Bld: 6.7 % — ABNORMAL HIGH (ref 4.8–5.6)

## 2022-11-25 LAB — TSH+FREE T4
Free T4: 1.59 ng/dL (ref 0.82–1.77)
TSH: 2.72 u[IU]/mL (ref 0.450–4.500)

## 2022-12-02 DIAGNOSIS — Z6826 Body mass index (BMI) 26.0-26.9, adult: Secondary | ICD-10-CM | POA: Insufficient documentation

## 2022-12-02 DIAGNOSIS — E782 Mixed hyperlipidemia: Secondary | ICD-10-CM | POA: Insufficient documentation

## 2022-12-13 ENCOUNTER — Other Ambulatory Visit: Payer: Self-pay | Admitting: Nurse Practitioner

## 2022-12-13 DIAGNOSIS — L0292 Furuncle, unspecified: Secondary | ICD-10-CM

## 2022-12-13 DIAGNOSIS — E039 Hypothyroidism, unspecified: Secondary | ICD-10-CM

## 2022-12-13 DIAGNOSIS — I10 Essential (primary) hypertension: Secondary | ICD-10-CM

## 2022-12-14 ENCOUNTER — Telehealth: Payer: Self-pay

## 2022-12-14 NOTE — Progress Notes (Cosign Needed)
Faxed 2024 re-enrollment application to [employer] patient assistance program for Trulicity.   Pattricia Boss, [place] Pharmacist Assistant [phone]

## 2022-12-22 ENCOUNTER — Telehealth: Payer: Self-pay

## 2022-12-22 NOTE — Progress Notes (Cosign Needed)
Patient approved to receive Trulicity through [employer] patient assistance for 2024 enrollment year.   Pattricia Boss, [place] Pharmacist Assistant [phone]

## 2023-02-09 ENCOUNTER — Other Ambulatory Visit: Payer: Self-pay | Admitting: Nurse Practitioner

## 2023-02-09 ENCOUNTER — Other Ambulatory Visit: Payer: Self-pay

## 2023-02-09 ENCOUNTER — Encounter: Payer: Self-pay | Admitting: Nurse Practitioner

## 2023-02-09 DIAGNOSIS — L0292 Furuncle, unspecified: Secondary | ICD-10-CM

## 2023-02-19 ENCOUNTER — Other Ambulatory Visit: Payer: Self-pay | Admitting: Nurse Practitioner

## 2023-02-24 DIAGNOSIS — E1122 Type 2 diabetes mellitus with diabetic chronic kidney disease: Secondary | ICD-10-CM | POA: Diagnosis not present

## 2023-02-24 DIAGNOSIS — K625 Hemorrhage of anus and rectum: Secondary | ICD-10-CM

## 2023-02-24 DIAGNOSIS — K5900 Constipation, unspecified: Secondary | ICD-10-CM | POA: Insufficient documentation

## 2023-02-24 DIAGNOSIS — D509 Iron deficiency anemia, unspecified: Secondary | ICD-10-CM | POA: Insufficient documentation

## 2023-02-24 DIAGNOSIS — E785 Hyperlipidemia, unspecified: Secondary | ICD-10-CM | POA: Diagnosis not present

## 2023-02-24 DIAGNOSIS — N1832 Chronic kidney disease, stage 3b: Secondary | ICD-10-CM | POA: Diagnosis not present

## 2023-02-24 DIAGNOSIS — I129 Hypertensive chronic kidney disease with stage 1 through stage 4 chronic kidney disease, or unspecified chronic kidney disease: Secondary | ICD-10-CM | POA: Diagnosis not present

## 2023-02-24 HISTORY — DX: Constipation, unspecified: K59.00

## 2023-02-24 HISTORY — DX: Hemorrhage of anus and rectum: K62.5

## 2023-02-25 ENCOUNTER — Encounter: Payer: Self-pay | Admitting: Nurse Practitioner

## 2023-02-25 ENCOUNTER — Ambulatory Visit (INDEPENDENT_AMBULATORY_CARE_PROVIDER_SITE_OTHER): Payer: Medicare Other

## 2023-02-25 ENCOUNTER — Ambulatory Visit (INDEPENDENT_AMBULATORY_CARE_PROVIDER_SITE_OTHER): Payer: Medicare Other | Admitting: Nurse Practitioner

## 2023-02-25 VITALS — BP 100/58 | HR 80 | Temp 98.3°F | Ht 64.0 in | Wt 159.6 lb

## 2023-02-25 VITALS — BP 100/58 | HR 80 | Temp 98.3°F | Wt 159.0 lb

## 2023-02-25 DIAGNOSIS — N1831 Chronic kidney disease, stage 3a: Secondary | ICD-10-CM | POA: Diagnosis not present

## 2023-02-25 DIAGNOSIS — Z0000 Encounter for general adult medical examination without abnormal findings: Secondary | ICD-10-CM | POA: Diagnosis not present

## 2023-02-25 DIAGNOSIS — E039 Hypothyroidism, unspecified: Secondary | ICD-10-CM

## 2023-02-25 DIAGNOSIS — E1122 Type 2 diabetes mellitus with diabetic chronic kidney disease: Secondary | ICD-10-CM

## 2023-02-25 DIAGNOSIS — I10 Essential (primary) hypertension: Secondary | ICD-10-CM | POA: Diagnosis not present

## 2023-02-25 DIAGNOSIS — Z23 Encounter for immunization: Secondary | ICD-10-CM

## 2023-02-25 DIAGNOSIS — D573 Sickle-cell trait: Secondary | ICD-10-CM

## 2023-02-25 NOTE — Progress Notes (Signed)
Subjective:   Catherine [name] is a 77 y.o. female who presents for Medicare Annual (Subsequent) preventive examination.  Review of Systems     Cardiac Risk Factors include: advanced age (>101men, >23 women);diabetes mellitus;hypertension     Objective:    Today's Vitals   02/25/23 1023  BP: (!) 100/58  Pulse: 80  Temp: 98.3 F (36.8 C)  TempSrc: Oral  SpO2: 96%  Weight: 159 lb 9.6 oz (72.4 kg)  Height: 5\' 4"  (1.626 m)   Body mass index is 27.4 kg/m.     02/25/2023   10:36 AM 02/12/2022   11:10 AM 01/23/2021   11:08 AM 01/18/2020   11:54 AM 10/04/2019   12:11 PM 08/17/2018    9:12 AM 12/21/2017   10:38 AM  Advanced Directives  Does Patient Have a Medical Advance Directive? Yes Yes Yes Yes Yes Yes No  Type of Estate agent of [employer] Power of [place];Living will Healthcare Power of [place];Living will Healthcare Power of [place];Living will Healthcare Power of [place];Living will    Copy of Healthcare Power of Attorney in Chart? No - copy requested No - copy requested No - copy requested No - copy requested No - copy requested      Current Medications (verified) Outpatient Encounter Medications as of 02/25/2023  Medication Sig   Accu-Chek FastClix Lancets MISC AS DIRECTED TWICE A DAY SUBCUTANEOUS 90 DAYS E11.9   ACCU-CHEK GUIDE test strip USE TO CHECK BLOOD SUGARS TWICE DAILY E11.69   Ascorbic Acid (VITAMIN C) 1000 MG tablet Take 1,000 mg by mouth daily.   aspirin 81 MG tablet Take 81 mg by mouth daily.   atorvastatin (LIPITOR) 10 MG tablet TAKE 1 TABLET BY MOUTH EVERY DAY   calcium citrate-vitamin D (CITRACAL+D) 315-200 MG-UNIT tablet Take 1 tablet by mouth 2 (two) times daily.   Cyanocobalamin (VITAMIN B 12 PO) Take 1,000 mg by mouth daily.   Dulaglutide (TRULICITY) 3 MG/0.5ML SOPN Inject 3 mg as directed once a week.   FARXIGA 10 MG TABS tablet TAKE 1 TABLET (10 MG TOTAL) BY MOUTH DAILY BEFORE BREAKFAST   ferrous sulfate 325 (65 FE)  MG tablet Take 1 tablet (325 mg total) by mouth 2 (two) times daily.   hydrocortisone (ANUSOL-HC) 2.5 % rectal cream INSERT 1 APPLICATION RECTALLY 2 TIMES DAILY.   levothyroxine (SYNTHROID) 112 MCG tablet TAKE 1 TABLET BY MOUTH EVERY DAY BEFORE BREAKFAST (Patient taking differently: Take Monday through Saturday)   lisinopril (ZESTRIL) 2.5 MG tablet TAKE 1 TABLET BY MOUTH EVERY DAY   Multiple Vitamin (MULTIVITAMIN WITH MINERALS) TABS tablet Take 1 tablet by mouth daily.   mupirocin ointment (BACTROBAN) 2 % APPLY TO AFFECTED AREA TWICE A DAY   Omega-3 Fatty Acids (FISH OIL) 1000 MG CAPS Take 1 capsule by mouth daily.    Wheat Dextrin (BENEFIBER DRINK MIX PO) Take by mouth.   beta carotene w/minerals (OCUVITE) tablet Take 1 tablet by mouth daily. (Patient not taking: Reported on 02/25/2023)   No facility-administered encounter medications on file as of 02/25/2023.    Allergies (verified) Patient has no known allergies.   History: Past Medical History:  Diagnosis Date   CAP (community acquired pneumonia)--treated 08/23/2014   Diabetes mellitus without complication    DKA (diabetic ketoacidoses) 08/13/2014   Hypertension    Pneumonia 08/13/2014   Thyroid disease    Past Surgical History:  Procedure Laterality Date   CHOLECYSTECTOMY     Family History  Problem Relation Age of Onset  Diabetes Mellitus II Mother    Stroke Mother    Diabetes Mellitus II Sister    Hypertension Sister    Social History   Socioeconomic History   Marital status: Married    Spouse name: Not on file   Number of children: Not on file   Years of education: Not on file   Highest education level: Not on file  Occupational History   Occupation: retired  Tobacco Use   Smoking status: Never   Smokeless tobacco: Never  Vaping Use   Vaping Use: Never used  Substance and Sexual Activity   Alcohol use: No   Drug use: No   Sexual activity: Not Currently  Other Topics Concern   Not on file  Social History  Narrative   Not on file   Social Determinants of Health   Financial Resource Strain: Low Risk  (02/25/2023)   Overall Financial Resource Strain (CARDIA)    Difficulty of Paying Living Expenses: Not hard at all  Food Insecurity: No Food Insecurity (02/25/2023)   Hunger Vital Sign    Worried About Running Out of Food in the Last Year: Never true    Ran Out of Food in the Last Year: Never true  Transportation Needs: No Transportation Needs (02/25/2023)   PRAPARE - Administrator, Civil Service (Medical): No    Lack of Transportation (Non-Medical): No  Physical Activity: Inactive (02/25/2023)   Exercise Vital Sign    Days of Exercise per Week: 0 days    Minutes of Exercise per Session: 0 min  Stress: No Stress Concern Present (02/25/2023)   [employer] of Occupational Health - Occupational Stress Questionnaire    Feeling of Stress : Only a little  Social Connections: Not on file    Tobacco Counseling Counseling given: Not Answered   Clinical Intake:  Pre-visit preparation completed: Yes  Pain : No/denies pain     Nutritional Status: BMI 25 -29 Overweight Nutritional Risks: None Diabetes: Yes  How often do you need to have someone help you when you read instructions, pamphlets, or other written materials from your doctor or pharmacy?: 1 - Never  Diabetic? Yes Nutrition Risk Assessment:  Has the patient had any N/V/D within the last 2 months?  No  Does the patient have any non-healing wounds?  No  Has the patient had any unintentional weight loss or weight gain?  No   Diabetes:  Is the patient diabetic?  Yes  If diabetic, was a CBG obtained today?  No  Did the patient bring in their glucometer from home?  No  How often do you monitor your CBG's? Twice daily.   Financial Strains and Diabetes Management:  Are you having any financial strains with the device, your supplies or your medication? No .  Does the patient want to be seen by Chronic Care  Management for management of their diabetes?  No  Would the patient like to be referred to a Nutritionist or for Diabetic Management?  No   Diabetic Exams:  Diabetic Eye Exam: Completed 07/03/2022 Diabetic Foot Exam: Completed 08/17/2022   Interpreter Needed?: No  Information entered by :: NAllen LPN   Activities of Daily Living    02/25/2023   10:39 AM  In your present state of health, do you have any difficulty performing the following activities:  Hearing? 0  Vision? 0  Difficulty concentrating or making decisions? 0  Walking or climbing stairs? 0  Dressing or bathing? 0  Doing errands, shopping?  0  Preparing Food and eating ? N  Using the Toilet? N  In the past six months, have you accidently leaked urine? N  Do you have problems with loss of bowel control? N  Managing your Medications? N  Managing your Finances? N  Housekeeping or managing your Housekeeping? N    Patient Care Team: Arnette FeltsMoore, [name], FNP as PCP - General (General Practice) Johney MaineKale, Gautam Kishore, MD as Consulting Physician (Hematology) Harlan StainsPearson, Vallie J, [facility] (Pharmacist)  Indicate any recent Medical Services you may have received from other than [facility] providers in the past year (date may be approximate).     Assessment:   This is a routine wellness examination for [place].  Hearing/Vision screen Vision Screening - Comments:: Regular eye exams, [facility]  Dietary issues and exercise activities discussed: Current Exercise Habits: The patient does not participate in regular exercise at present   Goals Addressed             This Visit's Progress    Patient Stated       02/25/2023, wants to maintain and get a tighter control on blood sugar       Depression Screen    02/25/2023   10:39 AM 11/24/2022   10:28 AM 02/12/2022   11:11 AM 01/23/2021   11:11 AM 12/10/2020    2:31 PM 01/18/2020   11:56 AM 01/18/2020   11:14 AM  PHQ 2/9 Scores  PHQ - 2 Score 0 0 0 0 2 0 0  PHQ- 9 Score     7 0      Fall Risk    02/25/2023   10:39 AM 11/24/2022   10:28 AM 08/17/2022   10:38 AM 05/18/2022   11:40 AM 02/12/2022   11:10 AM  Fall Risk   Falls in the past year? 0 0 0 0 0  Number falls in past yr: 0 0 0 0 0  Injury with Fall? 0 0 0 0 0  Risk for fall due to : Medication side effect No Fall Risks No Fall Risks No Fall Risks Medication side effect  Follow up Falls prevention discussed;Education provided;Falls evaluation completed Falls evaluation completed Falls evaluation completed Falls evaluation completed Falls evaluation completed;Education provided;Falls prevention discussed    FALL RISK PREVENTION PERTAINING TO THE HOME:  Any stairs in or around the home? Yes  If so, are there any without handrails? Yes  Home free of loose throw rugs in walkways, pet beds, electrical cords, etc? Yes  Adequate lighting in your home to reduce risk of falls? Yes   ASSISTIVE DEVICES UTILIZED TO PREVENT FALLS:  Life alert? No  Use of a cane, walker or w/c? No  Grab bars in the bathroom? No  Shower chair or bench in shower? No  Elevated toilet seat or a handicapped toilet? No   TIMED UP AND GO:  Was the test performed? Yes .  Length of time to ambulate 10 feet: 5 sec.   Gait steady and fast without use of assistive device  Cognitive Function:        02/25/2023   10:40 AM 02/12/2022   11:14 AM 01/23/2021   11:12 AM 01/18/2020   12:02 PM 10/04/2019   12:16 PM  6CIT Screen  What Year? 0 points 0 points 0 points 0 points 0 points  What month? 0 points 0 points 0 points 0 points 0 points  What time? 0 points 0 points 0 points 0 points 0 points  Count back from 20 0  points 0 points 0 points 0 points 0 points  Months in reverse 0 points 0 points 0 points 0 points 0 points  Repeat phrase 2 points 4 points 0 points 2 points 2 points  Total Score 2 points 4 points 0 points 2 points 2 points    Immunizations Immunization History  Administered Date(s) Administered   Fluad Quad(high Dose 65+)  08/11/2019, 09/13/2020, 08/12/2021, 09/03/2022   Influenza, High Dose Seasonal PF 08/08/2018   Influenza,inj,Quad PF,6+ Mos 08/16/2014   Influenza-Unspecified 08/08/2018, 08/11/2019   PFIZER Comirnaty(Gray Top)Covid-19 Tri-Sucrose Vaccine 06/27/2021   PFIZER(Purple Top)SARS-COV-2 Vaccination 12/21/2019, 01/11/2020, 10/23/2020, 06/27/2021, 09/18/2021   PNEUMOCOCCAL CONJUGATE-20 09/18/2021   Pneumococcal Conjugate-13 10/02/2020   Pneumococcal Polysaccharide-23 08/24/2019   Tdap 09/18/2019   Zoster Recombinat (Shingrix) 05/18/2022, 08/17/2022    TDAP status: Up to date  Flu Vaccine status: Up to date  Pneumococcal vaccine status: Up to date  Covid-19 vaccine status: Completed vaccines  Qualifies for Shingles Vaccine? Yes   Zostavax completed Yes   Shingrix Completed?: Yes  Screening Tests Health Maintenance  Topic Date Due   COVID-19 Vaccine (6 - 2023-24 season) 07/17/2022   HEMOGLOBIN A1C  05/25/2023   INFLUENZA VACCINE  06/17/2023   OPHTHALMOLOGY EXAM  07/04/2023   Diabetic kidney evaluation - Urine ACR  08/18/2023   FOOT EXAM  08/18/2023   DEXA SCAN  10/17/2023   MAMMOGRAM  10/23/2023   Diabetic kidney evaluation - eGFR measurement  11/25/2023   Medicare Annual Wellness (AWV)  02/25/2024   COLONOSCOPY (Pts 45-66yrs Insurance coverage will need to be confirmed)  02/25/2026   DTaP/Tdap/Td (2 - Td or Tdap) 09/17/2029   Pneumonia Vaccine 97+ Years old  Completed   Hepatitis C Screening  Completed   Zoster Vaccines- Shingrix  Completed   HPV VACCINES  Aged Out    Health Maintenance  Health Maintenance Due  Topic Date Due   COVID-19 Vaccine (6 - 2023-24 season) 07/17/2022    Colorectal cancer screening: Type of screening: Colonoscopy. Completed 02/26/2016. Repeat every 10 years  Mammogram status: Completed 10/22/2022. Repeat every year  Bone Density status: Completed 10/16/2021.   Lung Cancer Screening: (Low Dose CT Chest recommended if Age 77-80 years, 30 pack-year  currently smoking OR have quit w/in 15years.) does not qualify.   Lung Cancer Screening Referral: no  Additional Screening:  Hepatitis C Screening: does qualify; Completed 08/06/2020  Vision Screening: Recommended annual ophthalmology exams for early detection of glaucoma and other disorders of the eye. Is the patient up to date with their annual eye exam?  Yes  Who is the provider or what is the name of the office in which the patient attends annual eye exams? [facility] If pt is not established with a provider, would they like to be referred to a provider to establish care? No .   Dental Screening: Recommended annual dental exams for proper oral hygiene  Community Resource Referral / Chronic Care Management: CRR required this visit?  No   CCM required this visit?  No      Plan:     I have personally reviewed and noted the following in the patient's chart:   Medical and social history Use of alcohol, tobacco or illicit drugs  Current medications and supplements including opioid prescriptions. Patient is not currently taking opioid prescriptions. Functional ability and status Nutritional status Physical activity Advanced directives List of other physicians Hospitalizations, surgeries, and ER visits in previous 12 months Vitals Screenings to include cognitive, depression, and falls  Referrals and appointments  In addition, I have reviewed and discussed with patient certain preventive protocols, quality metrics, and best practice recommendations. A written personalized care plan for preventive services as well as general preventive health recommendations were provided to patient.     Barb Merino, LPN   02/06/4009   Nurse Notes: none

## 2023-02-25 NOTE — Progress Notes (Addendum)
I,Sheena H Holbrook,acting as a Neurosurgeon for [name], FNP.,have documented all relevant documentation on the behalf of [name], FNP,as directed by  [name], FNP while in the presence of [name], FNP.    Subjective:     Patient ID: Catherine [name] , female    DOB: May 02, 1946 , 77 y.o.   MRN: [id number]   Chief Complaint  Patient presents with   Medical Management of Chronic Issues    HPI  Patient presents today for follow up visit; also had AWV today. Patient has restarted Atorvastatin.   She did see the kidney provider  Hypertension This is a chronic problem. The current episode started more than 1 year ago. The problem is controlled. Pertinent negatives include no anxiety or headaches. There are no associated agents to hypertension. Risk factors for coronary artery disease include diabetes mellitus, obesity and dyslipidemia. There are no compliance problems.  There is no history of chronic renal disease.  Diabetes She presents for her follow-up diabetic visit. She has type 2 diabetes mellitus. Pertinent negatives for hypoglycemia include no dizziness or headaches. There are no diabetic associated symptoms. There are no hypoglycemic complications. There are no diabetic complications. Risk factors for coronary artery disease include sedentary lifestyle, hypertension, diabetes mellitus and dyslipidemia. Current diabetic treatment includes oral agent (dual therapy). She is following a diabetic diet. When asked about meal planning, she reported none. She has not had a previous visit with a dietitian. She participates in exercise every other day. Her home blood glucose trend is decreasing steadily. (Blood sugar 121-155, tolerating medications well) An ACE inhibitor/angiotensin II receptor blocker is being taken. She does not see a podiatrist.Eye exam is not current (She will schedule an appt August 18th).     Past Medical History:  Diagnosis Date   CAP (community acquired  pneumonia)--treated 08/23/2014   Diabetes mellitus without complication (HCC)    DKA (diabetic ketoacidoses) 08/13/2014   Hypertension    Pneumonia 08/13/2014   Thyroid disease      Family History  Problem Relation Age of Onset   Diabetes Mellitus II Mother    Stroke Mother    Diabetes Mellitus II Sister    Hypertension Sister      Current Outpatient Medications:    Accu-Chek FastClix Lancets MISC, AS DIRECTED TWICE A DAY SUBCUTANEOUS 90 DAYS E11.9, Disp: 204 each, Rfl: 4   ACCU-CHEK GUIDE test strip, USE TO CHECK BLOOD SUGARS TWICE DAILY E11.69, Disp: 100 strip, Rfl: 2   Ascorbic Acid (VITAMIN C) 1000 MG tablet, Take 1,000 mg by mouth daily., Disp: , Rfl:    aspirin 81 MG tablet, Take 81 mg by mouth daily., Disp: , Rfl:    atorvastatin (LIPITOR) 10 MG tablet, TAKE 1 TABLET BY MOUTH EVERY DAY, Disp: 90 tablet, Rfl: 0   beta carotene w/minerals (OCUVITE) tablet, Take 1 tablet by mouth daily. (Patient not taking: Reported on 02/25/2023), Disp: , Rfl:    calcium citrate-vitamin D (CITRACAL+D) 315-200 MG-UNIT tablet, Take 1 tablet by mouth 2 (two) times daily., Disp: , Rfl:    Cyanocobalamin (VITAMIN B 12 PO), Take 1,000 mg by mouth daily., Disp: , Rfl:    Dulaglutide (TRULICITY) 3 MG/0.5ML SOPN, Inject 3 mg as directed once a week., Disp: 12 mL, Rfl: 1   FARXIGA 10 MG TABS tablet, TAKE 1 TABLET BY MOUTH DAILY BEFORE BREAKFAST., Disp: 90 tablet, Rfl: 1   ferrous sulfate 325 (65 FE) MG tablet, Take 1 tablet (325 mg total) by mouth 2 (  two) times daily., Disp: 180 tablet, Rfl: 1   hydrocortisone (ANUSOL-HC) 2.5 % rectal cream, INSERT 1 APPLICATION RECTALLY 2 TIMES DAILY., Disp: 30 g, Rfl: 2   levothyroxine (SYNTHROID) 112 MCG tablet, Take Monday through Saturday, Disp: 90 tablet, Rfl: 0   lisinopril (ZESTRIL) 2.5 MG tablet, TAKE 1 TABLET BY MOUTH EVERY DAY, Disp: 90 tablet, Rfl: 1   Multiple Vitamin (MULTIVITAMIN WITH MINERALS) TABS tablet, Take 1 tablet by mouth daily., Disp: , Rfl:     mupirocin ointment (BACTROBAN) 2 %, APPLY TO AFFECTED AREA TWICE A DAY, Disp: 22 g, Rfl: 0   Omega-3 Fatty Acids (FISH OIL) 1000 MG CAPS, Take 1 capsule by mouth daily. , Disp: , Rfl:    Wheat Dextrin (BENEFIBER DRINK MIX PO), Take by mouth., Disp: , Rfl:    No Known Allergies   Review of Systems  Constitutional: Negative.   Respiratory: Negative.    Cardiovascular: Negative.   Neurological: Negative.  Negative for dizziness and headaches.  Psychiatric/Behavioral: Negative.    All other systems reviewed and are negative.    There were no vitals filed for this visit. There is no height or weight on file to calculate BMI.   Objective:  Physical Exam Vitals reviewed.  Constitutional:      General: She is not in acute distress.    Appearance: Normal appearance. She is obese.  Cardiovascular:     Rate and Rhythm: Normal rate and regular rhythm.     Pulses: Normal pulses.     Heart sounds: Normal heart sounds. No murmur heard. Pulmonary:     Effort: Pulmonary effort is normal. No respiratory distress.     Breath sounds: Normal breath sounds. No wheezing.  Skin:    General: Skin is warm and dry.     Capillary Refill: Capillary refill takes less than 2 seconds.  Neurological:     General: No focal deficit present.     Mental Status: She is alert and oriented to person, place, and time.     Cranial Nerves: No cranial nerve deficit.     Motor: No weakness.  Psychiatric:        Mood and Affect: Mood normal.        Behavior: Behavior normal.        Thought Content: Thought content normal.        Judgment: Judgment normal.         Assessment And Plan:     1. Type 2 diabetes mellitus with stage 3a chronic kidney disease, without long-term current use of insulin (HCC) Comments: Hemoglobin A1c is stable at last visit 6.  Blood sugars are improving.  Continue current medications. - Lipid panel - CMP14 + Anion Gap - Hemoglobin A1c  2. Essential hypertension Comments: Blood  pressure is well controlled. Continue current medications - CMP14 + Anion Gap  3. Acquired hypothyroidism Comments: Thyroid levels are controlled, continue current medications - TSH + free T4  4. Need for COVID-19 vaccine Covid 19 vaccine given in office observed for 15 minutes without any adverse reaction - Pfizer Fall 2023 Covid-19 Vaccine 67yrs and older  5. Sickle-cell trait (HCC)  No issues, no longer taking iron supplement. No longer being followed by Hematology   Patient was given opportunity to ask questions. Patient verbalized understanding of the plan and was able to repeat key elements of the plan. All questions were answered to their satisfaction.  [name], FNP    I, [name], FNP, have reviewed all documentation  for this visit. The documentation on 02/25/23 for the exam, diagnosis, procedures, and orders are all accurate and complete.  IF YOU HAVE BEEN REFERRED TO A SPECIALIST, IT MAY TAKE 1-2 WEEKS TO SCHEDULE/PROCESS THE REFERRAL. IF YOU HAVE NOT HEARD FROM US/SPECIALIST IN TWO WEEKS, PLEASE GIVE [place] A CALL AT [phone].   THE PATIENT IS ENCOURAGED TO PRACTICE SOCIAL DISTANCING DUE TO THE COVID-19 PANDEMIC.

## 2023-02-25 NOTE — Patient Instructions (Signed)
Catherine [name] , Thank you for taking time to come for your Medicare Wellness Visit. I appreciate your ongoing commitment to your health goals. Please review the following plan we discussed and let me know if I can assist you in the future.   These are the goals we discussed:  Goals       HEMOGLOBIN A1C < 7.0 (pt-stated)      Manage My Medicine      Timeframe:  Long-Range Goal Priority:  High Start Date:                             Expected End Date:                       Follow Up Date 08/07/2022   In Progress:  - call for medicine refill 2 or 3 days before it runs out - call if I am sick and can't take my medicine - keep a list of all the medicines I take; vitamins and herbals too - use a pillbox to sort medicine    Why is this important?   These steps will help you keep on track with your medicines.       Patient Stated      10/04/2019, wants to stay healthy and avoid the virus      Patient Stated      01/18/2020, wants to maintain health and make own decisions      Patient Stated      01/23/2021, get blood sugars under control      Patient Stated      02/12/2022, not to get on any more medication and stay healthy      Patient Stated      02/25/2023, wants to maintain and get a tighter control on blood sugar      Pharmacy Care Plan      CARE PLAN ENTRY (see longitudinal plan of care for additional care plan information)  Current Barriers:  Chronic Disease Management support, education, and care coordination needs related to Hypertension, Hyperlipidemia, and Diabetes   Hypertension BP Readings from Last 3 Encounters:  11/05/20 124/68  09/13/20 120/74  08/06/20 124/80  Pharmacist Clinical Goal(s): Over the next 90 days, patient will work with PharmD and providers to maintain BP goal <130/80 Current regimen:  Lisinopril 2.5mg  daily Interventions: Reviewed recent home blood pressure readings Commended her on her diet and exercise lifestyle changes Patient self care  activities - Over the next 180 days, patient will: Check BP twice daily, document, and provide at future appointments Ensure daily salt intake < 2300 mg/day Try to exercise for 30 minutes daily 5 times per week (150 minutes per week total)  Hyperlipidemia Lab Results  Component Value Date/Time   LDLCALC 88 11/05/2020 12:51 PM  Pharmacist Clinical Goal(s): Over the next 90 days, patient will work with PharmD and providers to achieve LDL goal < 70 Current regimen:  Atorvastatin 10mg  daily Interventions: Provided dietary and exercise recommendations Discussed appropriate goals for LDL  Patient self care activities - Over the next 90 days, patient will: Try to exercise for 30 minutes daily 5 times per week (150 minutes per week total) Focus on eating a healthy, well-balanced diet   Diabetes Lab Results  Component Value Date/Time   HGBA1C 7.2 (H) 11/05/2020 12:51 PM   HGBA1C 6.2 (H) 08/06/2020 03:02 PM  Pharmacist Clinical Goal(s): Over the next 90 days, patient will work  with PharmD and providers to achieve A1c goal <7% Current regimen:  Trulicity 1.5mg  weekly on Tuesdays  Interventions: Discussed appropriate goals for fasting blood sugar (80-130) and 2 hours after eating (less than 180) Will need to re-enroll patient for [employer] for 2022 Patient self care activities - Over the next 90 days, patient will: Check blood sugar twice daily, document, and provide at future appointments Start limiting True Moo chocolate milk to a small bottle once a month  Contact provider with any episodes of hypoglycemia Try to exercise for 30 minutes daily 5 times per week (150 minutes per week total)  Medication management Pharmacist Clinical Goal(s): Over the next 180 days, patient will work with PharmD and providers to maintain optimal medication adherence Current pharmacy: [facility] Interventions Comprehensive medication review performed. Continue current medication management strategy Patient  self care activities - Over the next 180 days, patient will: Focus on medication adherence by continued use of pill box Take medications as prescribed Report any questions or concerns to PharmD and/or provider(s)  Please see past updates related to this goal by clicking on the "Past Updates" button in the selected goal          This is a list of the screening recommended for you and due dates:  Health Maintenance  Topic Date Due   COVID-19 Vaccine (6 - 2023-24 season) 07/17/2022   Hemoglobin A1C  05/25/2023   Flu Shot  06/17/2023   Eye exam for diabetics  07/04/2023   Yearly kidney health urinalysis for diabetes  08/18/2023   Complete foot exam   08/18/2023   DEXA scan (bone density measurement)  10/17/2023   Mammogram  10/23/2023   Yearly kidney function blood test for diabetes  11/25/2023   Medicare Annual Wellness Visit  02/25/2024   Colon Cancer Screening  02/25/2026   DTaP/Tdap/Td vaccine (2 - Td or Tdap) 09/17/2029   Pneumonia Vaccine  Completed   Hepatitis C Screening: USPSTF Recommendation to screen - Ages 43-79 yo.  Completed   Zoster (Shingles) Vaccine  Completed   HPV Vaccine  Aged Out    Advanced directives: Please bring a copy of your POA (Power of [place]) and/or Living Will to your next appointment.    Conditions/risks identified: none  Next appointment: Follow up in one year for your annual wellness visit    Preventive Care 65 Years and Older, Female Preventive care refers to lifestyle choices and visits with your health care provider that can promote health and wellness. What does preventive care include? A yearly physical exam. This is also called an annual well check. Dental exams once or twice a year. Routine eye exams. Ask your health care provider how often you should have your eyes checked. Personal lifestyle choices, including: Daily care of your teeth and gums. Regular physical activity. Eating a healthy diet. Avoiding tobacco and drug  use. Limiting alcohol use. Practicing safe sex. Taking low-dose aspirin every day. Taking vitamin and mineral supplements as recommended by your health care provider. What happens during an annual well check? The services and screenings done by your health care provider during your annual well check will depend on your age, overall health, lifestyle risk factors, and family history of disease. Counseling  Your health care provider may ask you questions about your: Alcohol use. Tobacco use. Drug use. Emotional well-being. Home and relationship well-being. Sexual activity. Eating habits. History of falls. Memory and ability to understand (cognition). Work and work Astronomer. Reproductive health. Screening  You may have  the following tests or measurements: Height, weight, and BMI. Blood pressure. Lipid and cholesterol levels. These may be checked every 5 years, or more frequently if you are over 76 years old. Skin check. Lung cancer screening. You may have this screening every year starting at age 76 if you have a 30-pack-year history of smoking and currently smoke or have quit within the past 15 years. Fecal occult blood test (FOBT) of the stool. You may have this test every year starting at age 76. Flexible sigmoidoscopy or colonoscopy. You may have a sigmoidoscopy every 5 years or a colonoscopy every 10 years starting at age 76. Hepatitis C blood test. Hepatitis B blood test. Sexually transmitted disease (STD) testing. Diabetes screening. This is done by checking your blood sugar (glucose) after you have not eaten for a while (fasting). You may have this done every 1-3 years. Bone density scan. This is done to screen for osteoporosis. You may have this done starting at age 76. Mammogram. This may be done every 1-2 years. Talk to your health care provider about how often you should have regular mammograms. Talk with your health care provider about your test results, treatment  options, and if necessary, the need for more tests. Vaccines  Your health care provider may recommend certain vaccines, such as: Influenza vaccine. This is recommended every year. Tetanus, diphtheria, and acellular pertussis (Tdap, Td) vaccine. You may need a Td booster every 10 years. Zoster vaccine. You may need this after age 76. Pneumococcal 13-valent conjugate (PCV13) vaccine. One dose is recommended after age 76. Pneumococcal polysaccharide (PPSV23) vaccine. One dose is recommended after age 76. Talk to your health care provider about which screenings and vaccines you need and how often you need them. This information is not intended to replace advice given to you by your health care provider. Make sure you discuss any questions you have with your health care provider. Document Released: 11/29/2015 Document Revised: 07/22/2016 Document Reviewed: 09/03/2015 Elsevier Interactive Patient Education  2017 [employer]  Fall Prevention in the Home Falls can cause injuries. They can happen to people of all ages. There are many things you can do to make your home safe and to help prevent falls. What can I do on the outside of my home? Regularly fix the edges of walkways and driveways and fix any cracks. Remove anything that might make you trip as you walk through a door, such as a raised step or threshold. Trim any bushes or trees on the path to your home. Use bright outdoor lighting. Clear any walking paths of anything that might make someone trip, such as rocks or tools. Regularly check to see if handrails are loose or broken. Make sure that both sides of any steps have handrails. Any raised decks and porches should have guardrails on the edges. Have any leaves, snow, or ice cleared regularly. Use sand or salt on walking paths during winter. Clean up any spills in your garage right away. This includes oil or grease spills. What can I do in the bathroom? Use night lights. Install grab  bars by the toilet and in the tub and shower. Do not use towel bars as grab bars. Use non-skid mats or decals in the tub or shower. If you need to sit down in the shower, use a plastic, non-slip stool. Keep the floor dry. Clean up any water that spills on the floor as soon as it happens. Remove soap buildup in the tub or shower regularly. Attach bath mats  securely with double-sided non-slip rug tape. Do not have throw rugs and other things on the floor that can make you trip. What can I do in the bedroom? Use night lights. Make sure that you have a light by your bed that is easy to reach. Do not use any sheets or blankets that are too big for your bed. They should not hang down onto the floor. Have a firm chair that has side arms. You can use this for support while you get dressed. Do not have throw rugs and other things on the floor that can make you trip. What can I do in the kitchen? Clean up any spills right away. Avoid walking on wet floors. Keep items that you use a lot in easy-to-reach places. If you need to reach something above you, use a strong step stool that has a grab bar. Keep electrical cords out of the way. Do not use floor polish or wax that makes floors slippery. If you must use wax, use non-skid floor wax. Do not have throw rugs and other things on the floor that can make you trip. What can I do with my stairs? Do not leave any items on the stairs. Make sure that there are handrails on both sides of the stairs and use them. Fix handrails that are broken or loose. Make sure that handrails are as long as the stairways. Check any carpeting to make sure that it is firmly attached to the stairs. Fix any carpet that is loose or worn. Avoid having throw rugs at the top or bottom of the stairs. If you do have throw rugs, attach them to the floor with carpet tape. Make sure that you have a light switch at the top of the stairs and the bottom of the stairs. If you do not have them,  ask someone to add them for you. What else can I do to help prevent falls? Wear shoes that: Do not have high heels. Have rubber bottoms. Are comfortable and fit you well. Are closed at the toe. Do not wear sandals. If you use a stepladder: Make sure that it is fully opened. Do not climb a closed stepladder. Make sure that both sides of the stepladder are locked into place. Ask someone to hold it for you, if possible. Clearly mark and make sure that you can see: Any grab bars or handrails. First and last steps. Where the edge of each step is. Use tools that help you move around (mobility aids) if they are needed. These include: Canes. Walkers. Scooters. Crutches. Turn on the lights when you go into a dark area. Replace any light bulbs as soon as they burn out. Set up your furniture so you have a clear path. Avoid moving your furniture around. If any of your floors are uneven, fix them. If there are any pets around you, be aware of where they are. Review your medicines with your doctor. Some medicines can make you feel dizzy. This can increase your chance of falling. Ask your doctor what other things that you can do to help prevent falls. This information is not intended to replace advice given to you by your health care provider. Make sure you discuss any questions you have with your health care provider. Document Released: 08/29/2009 Document Revised: 04/09/2016 Document Reviewed: 12/07/2014 Elsevier Interactive Patient Education  2017 [employer].

## 2023-02-26 LAB — CMP14 + ANION GAP
ALT: 27 IU/L (ref 0–32)
AST: 25 IU/L (ref 0–40)
Albumin/Globulin Ratio: 1.9 (ref 1.2–2.2)
Albumin: 4.5 g/dL (ref 3.8–4.8)
Alkaline Phosphatase: 75 IU/L (ref 44–121)
Anion Gap: 13 mmol/L (ref 10.0–18.0)
BUN/Creatinine Ratio: 15 (ref 12–28)
BUN: 18 mg/dL (ref 8–27)
Bilirubin Total: 0.8 mg/dL (ref 0.0–1.2)
CO2: 22 mmol/L (ref 20–29)
Calcium: 10.1 mg/dL (ref 8.7–10.3)
Chloride: 101 mmol/L (ref 96–106)
Creatinine, Ser: 1.23 mg/dL — ABNORMAL HIGH (ref 0.57–1.00)
Globulin, Total: 2.4 g/dL (ref 1.5–4.5)
Glucose: 117 mg/dL — ABNORMAL HIGH (ref 70–99)
Potassium: 4.4 mmol/L (ref 3.5–5.2)
Sodium: 136 mmol/L (ref 134–144)
Total Protein: 6.9 g/dL (ref 6.0–8.5)
eGFR: 46 mL/min/{1.73_m2} — ABNORMAL LOW (ref >59)

## 2023-02-26 LAB — HEMOGLOBIN A1C
Est. average glucose Bld gHb Est-mCnc: 134 mg/dL
Hgb A1c MFr Bld: 6.3 % — ABNORMAL HIGH (ref 4.8–5.6)

## 2023-02-26 LAB — LIPID PANEL
Chol/HDL Ratio: 2.6 ratio (ref 0.0–4.4)
Cholesterol, Total: 152 mg/dL (ref 100–199)
HDL: 59 mg/dL (ref >39)
LDL Chol Calc (NIH): 79 mg/dL (ref 0–99)
Triglycerides: 74 mg/dL (ref 0–149)
VLDL Cholesterol Cal: 14 mg/dL (ref 5–40)

## 2023-02-26 LAB — TSH+FREE T4
Free T4: 1.87 ng/dL — ABNORMAL HIGH (ref 0.82–1.77)
TSH: 1.5 u[IU]/mL (ref 0.450–4.500)

## 2023-02-28 ENCOUNTER — Other Ambulatory Visit: Payer: Self-pay | Admitting: Nurse Practitioner

## 2023-02-28 DIAGNOSIS — E119 Type 2 diabetes mellitus without complications: Secondary | ICD-10-CM

## 2023-02-28 DIAGNOSIS — L0292 Furuncle, unspecified: Secondary | ICD-10-CM

## 2023-02-28 DIAGNOSIS — E039 Hypothyroidism, unspecified: Secondary | ICD-10-CM

## 2023-03-12 ENCOUNTER — Other Ambulatory Visit: Payer: Self-pay | Admitting: Nurse Practitioner

## 2023-04-19 ENCOUNTER — Other Ambulatory Visit: Payer: Medicare Other

## 2023-04-19 DIAGNOSIS — E039 Hypothyroidism, unspecified: Secondary | ICD-10-CM | POA: Diagnosis not present

## 2023-04-20 LAB — TSH: TSH: 1.31 u[IU]/mL (ref 0.450–4.500)

## 2023-04-25 ENCOUNTER — Other Ambulatory Visit: Payer: Self-pay | Admitting: Nurse Practitioner

## 2023-04-25 DIAGNOSIS — E039 Hypothyroidism, unspecified: Secondary | ICD-10-CM

## 2023-06-19 ENCOUNTER — Other Ambulatory Visit: Payer: Self-pay | Admitting: Nurse Practitioner

## 2023-08-23 ENCOUNTER — Encounter: Payer: Self-pay | Admitting: Nurse Practitioner

## 2023-08-23 ENCOUNTER — Ambulatory Visit (INDEPENDENT_AMBULATORY_CARE_PROVIDER_SITE_OTHER): Payer: Medicare Other | Admitting: Nurse Practitioner

## 2023-08-23 VITALS — BP 122/74 | HR 75 | Temp 98.2°F | Ht 64.4 in | Wt 159.4 lb

## 2023-08-23 DIAGNOSIS — N1831 Chronic kidney disease, stage 3a: Secondary | ICD-10-CM

## 2023-08-23 DIAGNOSIS — I129 Hypertensive chronic kidney disease with stage 1 through stage 4 chronic kidney disease, or unspecified chronic kidney disease: Secondary | ICD-10-CM

## 2023-08-23 DIAGNOSIS — E1122 Type 2 diabetes mellitus with diabetic chronic kidney disease: Secondary | ICD-10-CM

## 2023-08-23 DIAGNOSIS — Z0000 Encounter for general adult medical examination without abnormal findings: Secondary | ICD-10-CM | POA: Diagnosis not present

## 2023-08-23 DIAGNOSIS — I10 Essential (primary) hypertension: Secondary | ICD-10-CM | POA: Diagnosis not present

## 2023-08-23 DIAGNOSIS — E782 Mixed hyperlipidemia: Secondary | ICD-10-CM | POA: Diagnosis not present

## 2023-08-23 DIAGNOSIS — D573 Sickle-cell trait: Secondary | ICD-10-CM

## 2023-08-23 DIAGNOSIS — E039 Hypothyroidism, unspecified: Secondary | ICD-10-CM

## 2023-08-23 DIAGNOSIS — Z23 Encounter for immunization: Secondary | ICD-10-CM | POA: Diagnosis not present

## 2023-08-23 LAB — POCT URINALYSIS DIPSTICK
Bilirubin, UA: NEGATIVE
Blood, UA: NEGATIVE
Glucose, UA: POSITIVE — AB
Ketones, UA: NEGATIVE
Leukocytes, UA: NEGATIVE
Nitrite, UA: NEGATIVE
Protein, UA: NEGATIVE
Spec Grav, UA: 1.01 (ref 1.010–1.025)
Urobilinogen, UA: 0.2 U/dL
pH, UA: 6 (ref 5.0–8.0)

## 2023-08-23 NOTE — Assessment & Plan Note (Addendum)
HgbA1c improved at last visit, continue focusing on healthy diet and important to get more activity.  Diabetic foot exam done.

## 2023-08-23 NOTE — Assessment & Plan Note (Signed)
Influenza vaccine administered Encouraged to take Tylenol as needed for fever or muscle aches.

## 2023-08-23 NOTE — Progress Notes (Addendum)
I,Jameka J Llittleton, CMA,acting as a Neurosurgeon for [employer], FNP.,have documented all relevant documentation on the behalf of [name], FNP,as directed by  [name], FNP while in the presence of [name], FNP.  Subjective:    Patient ID: Catherine [name] , female    DOB: 05-14-1946 , 77 y.o.   MRN: [id number]  Chief Complaint  Patient presents with   Annual Exam    HPI  Patient presents today for a physical. Patient reports compliance with her meds. Patient reported the price on her farxiga has gone up to $47 for a 30 days.   Diabetes She presents for her follow-up diabetic visit. She has type 2 diabetes mellitus. Her disease course has been stable. There are no hypoglycemic associated symptoms. Pertinent negatives for diabetes include no blurred vision and no chest pain. There are no hypoglycemic complications. Symptoms are stable. Diabetic complications include nephropathy. Pertinent negatives for diabetic complications include no CVA. Risk factors for coronary artery disease include diabetes mellitus and sedentary lifestyle. Current diabetic treatment includes oral agent (dual therapy). She is compliant with treatment all of the time. Her weight is decreasing steadily. She is following a diabetic diet. When asked about meal planning, she reported none. She has not had a previous visit with a dietitian. She rarely participates in exercise. (Blood sugars 129-140 - she is on Trulicity 3 mg weekly) An ACE inhibitor/angiotensin II receptor blocker is being taken. She does not see a podiatrist.Eye exam is not current (will request records from Dr. Dione Booze).  Hypertension This is a chronic problem. The current episode started more than 1 year ago. The problem is unchanged. The problem is controlled. Pertinent negatives include no anxiety, blurred vision, chest pain or palpitations. There are no associated agents to hypertension. Risk factors for coronary artery disease include obesity and  sedentary lifestyle. Past treatments include ACE inhibitors. The current treatment provides significant improvement. There are no compliance problems.  Hypertensive end-organ damage includes kidney disease. There is no history of angina or CVA. There is no history of chronic renal disease.     Past Medical History:  Diagnosis Date   CAP (community acquired pneumonia)--treated 08/23/2014   Diabetes mellitus without complication (HCC)    DKA (diabetic ketoacidoses) 08/13/2014   DKA, type 2 (HCC) 08/13/2014   Hypertension    Pneumonia 08/13/2014   Thyroid disease      Family History  Problem Relation Age of Onset   Diabetes Mellitus II Mother    Stroke Mother    Diabetes Mellitus II Sister    Hypertension Sister      Current Outpatient Medications:    Accu-Chek FastClix Lancets MISC, AS DIRECTED TWICE A DAY SUBCUTANEOUS 90 DAYS E11.9, Disp: 204 each, Rfl: 4   ACCU-CHEK GUIDE test strip, USE TO CHECK BLOOD SUGARS TWICE DAILY E11.69, Disp: 100 strip, Rfl: 2   Ascorbic Acid (VITAMIN C) 1000 MG tablet, Take 1,000 mg by mouth daily., Disp: , Rfl:    aspirin 81 MG tablet, Take 81 mg by mouth daily., Disp: , Rfl:    atorvastatin (LIPITOR) 10 MG tablet, TAKE 1 TABLET BY MOUTH EVERY DAY, Disp: 90 tablet, Rfl: 0   calcium citrate-vitamin D (CITRACAL+D) 315-200 MG-UNIT tablet, Take 1 tablet by mouth 2 (two) times daily., Disp: , Rfl:    Cyanocobalamin (VITAMIN B 12 PO), Take 1,000 mg by mouth daily., Disp: , Rfl:    Dulaglutide (TRULICITY) 3 MG/0.5ML SOPN, Inject 3 mg as directed once a week., Disp: 12  mL, Rfl: 1   FARXIGA 10 MG TABS tablet, TAKE 1 TABLET BY MOUTH DAILY BEFORE BREAKFAST., Disp: 90 tablet, Rfl: 1   ferrous sulfate 325 (65 FE) MG tablet, Take 1 tablet (325 mg total) by mouth 2 (two) times daily., Disp: 180 tablet, Rfl: 1   hydrocortisone (ANUSOL-HC) 2.5 % rectal cream, INSERT 1 APPLICATION RECTALLY 2 TIMES DAILY., Disp: 30 g, Rfl: 2   levothyroxine (SYNTHROID) 112 MCG tablet, TAKE  ONE TABLET BY MOUT MONDAY THROUGH SATURDAY, Disp: 90 tablet, Rfl: 0   lisinopril (ZESTRIL) 2.5 MG tablet, TAKE 1 TABLET BY MOUTH EVERY DAY, Disp: 90 tablet, Rfl: 1   Multiple Vitamin (MULTIVITAMIN WITH MINERALS) TABS tablet, Take 1 tablet by mouth daily., Disp: , Rfl:    mupirocin ointment (BACTROBAN) 2 %, APPLY TO AFFECTED AREA TWICE A DAY, Disp: 22 g, Rfl: 0   Omega-3 Fatty Acids (FISH OIL) 1000 MG CAPS, Take 1 capsule by mouth daily. , Disp: , Rfl:    Wheat Dextrin (BENEFIBER DRINK MIX PO), Take by mouth., Disp: , Rfl:    No Known Allergies    The patient states she uses post menopausal status for birth control. No LMP recorded. Patient is postmenopausal.  Negative for: breast discharge, breast lump(s), breast pain and breast self exam. Associated symptoms include abnormal vaginal bleeding. Pertinent negatives include abnormal bleeding (hematology), anxiety, decreased libido, depression, difficulty falling sleep, dyspareunia, history of infertility, nocturia, sexual dysfunction, sleep disturbances, urinary incontinence, urinary urgency, vaginal discharge and vaginal itching. Diet regular.The patient states her exercise level is minimal - she is caring for her husband.  The patient's tobacco use is:  Social History   Tobacco Use  Smoking Status Never  Smokeless Tobacco Never   She has been exposed to passive smoke. The patient's alcohol use is:  Social History   Substance and Sexual Activity  Alcohol Use No   Review of Systems  Constitutional: Negative.   HENT: Negative.    Eyes: Negative.  Negative for blurred vision.  Respiratory: Negative.    Cardiovascular: Negative.  Negative for chest pain, palpitations and leg swelling.  Gastrointestinal: Negative.   Endocrine: Negative.   Genitourinary: Negative.   Musculoskeletal: Negative.   Skin: Negative.   Allergic/Immunologic: Negative.   Neurological: Negative.   Hematological: Negative.   Psychiatric/Behavioral: Negative.        Today's Vitals   08/23/23 0959  BP: 122/74  Pulse: 75  Temp: 98.2 F (36.8 C)  Weight: 159 lb 6.4 oz (72.3 kg)  Height: 5' 4.4" (1.636 m)  PainSc: 0-No pain   Body mass index is 27.02 kg/m.  Wt Readings from Last 3 Encounters:  08/23/23 159 lb 6.4 oz (72.3 kg)  02/25/23 159 lb (72.1 kg)  02/25/23 159 lb 9.6 oz (72.4 kg)     Objective:  Physical Exam Vitals reviewed.  Constitutional:      General: She is not in acute distress.    Appearance: Normal appearance. She is well-developed.  HENT:     Head: Normocephalic and atraumatic.     Right Ear: Hearing, tympanic membrane, ear canal and external ear normal. There is no impacted cerumen.     Left Ear: Hearing, tympanic membrane, ear canal and external ear normal. There is no impacted cerumen.     Nose: Nose normal.     Mouth/Throat:     Mouth: Mucous membranes are moist.  Eyes:     General: Lids are normal.     Extraocular Movements: Extraocular movements intact.  Conjunctiva/sclera: Conjunctivae normal.     Pupils: Pupils are equal, round, and reactive to light.     Funduscopic exam:    Right eye: No papilledema.        Left eye: No papilledema.  Neck:     Thyroid: No thyroid mass.     Vascular: No carotid bruit.  Cardiovascular:     Rate and Rhythm: Normal rate and regular rhythm.     Pulses: Normal pulses.     Heart sounds: Normal heart sounds. No murmur heard. Pulmonary:     Effort: Pulmonary effort is normal. No respiratory distress.     Breath sounds: Normal breath sounds. No wheezing.  Abdominal:     General: Abdomen is flat. Bowel sounds are normal. There is no distension.     Palpations: Abdomen is soft.     Tenderness: There is no abdominal tenderness.  Musculoskeletal:        General: No swelling. Normal range of motion.     Cervical back: Full passive range of motion without pain, normal range of motion and neck supple.     Right lower leg: No edema.     Left lower leg: No edema.  Skin:     General: Skin is warm and dry.     Capillary Refill: Capillary refill takes less than 2 seconds.  Neurological:     General: No focal deficit present.     Mental Status: She is alert and oriented to person, place, and time.     Cranial Nerves: No cranial nerve deficit.     Sensory: No sensory deficit.     Motor: No weakness.  Psychiatric:        Mood and Affect: Mood normal.        Behavior: Behavior normal.        Thought Content: Thought content normal.        Judgment: Judgment normal.      Title   Diabetic Foot Exam - detailed Date & Time: 08/23/2023 10:44 AM Diabetic Foot exam was performed with the following findings: Yes  Visual Foot Exam completed.: Yes  Is there a history of foot ulcer?: No Is there a foot ulcer now?: No Is there swelling?: No Is there elevated skin temperature?: No Is there abnormal foot shape?: No Is there a claw toe deformity?: No Are the toenails long?: No Are the toenails thick?: Yes Are the toenails ingrown?: No Is the skin thin, fragile, shiny and hairless?": No Normal Range of Motion?: Yes Is there foot or ankle muscle weakness?: No Do you have pain in calf while walking?: No Are the shoes appropriate in style and fit?: No Can the patient see the bottom of their feet?: Yes Pulse Foot Exam completed.: Yes   Right Posterior Tibialis: Present Left posterior Tibialis: Present   Right Dorsalis Pedis: Present Left Dorsalis Pedis: Present     Sensory Foot Exam Completed.: Yes Semmes-Weinstein Monofilament Test "+" means "has sensation" and "-" means "no sensation"   R Site 1-Great Toe: Pos L Site 1-Great Toe: Pos   R Site 4: Pos L Site 4: Pos   R Site 6: Pos L Site 6: Pos     Image components are not supported.   Image components are not supported. Image components are not supported.  [place] Comments        Assessment And Plan:     Encounter for annual physical exam Assessment & Plan: Behavior modifications discussed and  diet history reviewed.  Pt will continue to exercise regularly and modify diet with low GI, plant based foods and decrease intake of processed foods.  Recommend intake of daily multivitamin, Vitamin D, and calcium.  Recommend mammogram and colonoscopy for preventive screenings, as well as recommend immunizations that include influenza, TDAP, and Shingles (received flu and covid today)    Type 2 diabetes mellitus with stage 3a chronic kidney disease, without long-term current use of insulin (HCC) Assessment & Plan: HgbA1c improved at last visit, continue focusing on healthy diet and important to get more activity.  Diabetic foot exam done.   Orders: -     POCT urinalysis dipstick -     Microalbumin / creatinine urine ratio -     EKG 12-Lead -     Hemoglobin A1c  Essential hypertension Assessment & Plan: Blood pressure is well controlled, continue current medications. EKG done NSR HR 75  Orders: -     CBC -     CMP14+EGFR  Acquired hypothyroidism Assessment & Plan: She had a change in her levothyroxine dose at last visit due to overproductive. Will recheck levels today  Orders: -     TSH + free T4  Sickle-cell trait (HCC) Assessment & Plan: No issues   Mixed hyperlipidemia Assessment & Plan: Cholesterol levels are stable, continue statin tolerating well  Orders: -     Lipid panel  Need for influenza vaccination Assessment & Plan: Influenza vaccine administered Encouraged to take Tylenol as needed for fever or muscle aches.   Orders: -     Flu Vaccine Trivalent High Dose (Fluad)  Need for COVID-19 vaccine Assessment & Plan: Covid 19 vaccine given in office observed for 15 minutes without any adverse reaction   Orders: -     PFIZER Comirnaty(GRAY TOP)COVID-19 Vaccine   Return for 1 year physical; 3-4 month diabetes f/u.  Patient was given opportunity to ask questions. Patient verbalized understanding of the plan and was able to repeat key elements of the  plan. All questions were answered to their satisfaction.   [name], FNP  I, [name], FNP, have reviewed all documentation for this visit. The documentation on 08/23/23 for the exam, diagnosis, procedures, and orders are all accurate and complete.

## 2023-08-23 NOTE — Assessment & Plan Note (Addendum)
Blood pressure is well controlled, continue current medications. EKG done NSR HR 75

## 2023-08-23 NOTE — Assessment & Plan Note (Signed)
She had a change in her levothyroxine dose at last visit due to overproductive. Will recheck levels today

## 2023-08-23 NOTE — Assessment & Plan Note (Signed)
Covid 19 vaccine given in office observed for 15 minutes without any adverse reaction  

## 2023-08-23 NOTE — Assessment & Plan Note (Signed)
Cholesterol levels are stable, continue statin tolerating well.

## 2023-08-23 NOTE — Assessment & Plan Note (Signed)

## 2023-08-23 NOTE — Assessment & Plan Note (Signed)
No issues

## 2023-08-24 ENCOUNTER — Other Ambulatory Visit: Payer: Self-pay | Admitting: Nurse Practitioner

## 2023-08-24 DIAGNOSIS — I10 Essential (primary) hypertension: Secondary | ICD-10-CM

## 2023-08-24 LAB — LIPID PANEL
Chol/HDL Ratio: 2.4 {ratio} (ref 0.0–4.4)
Cholesterol, Total: 160 mg/dL (ref 100–199)
HDL: 66 mg/dL (ref >39)
LDL Chol Calc (NIH): 77 mg/dL (ref 0–99)
Triglycerides: 93 mg/dL (ref 0–149)
VLDL Cholesterol Cal: 17 mg/dL (ref 5–40)

## 2023-08-24 LAB — CMP14+EGFR
ALT: 30 [IU]/L (ref 0–32)
AST: 21 [IU]/L (ref 0–40)
Albumin: 4.7 g/dL (ref 3.8–4.8)
Alkaline Phosphatase: 82 [IU]/L (ref 44–121)
BUN/Creatinine Ratio: 12 (ref 12–28)
BUN: 17 mg/dL (ref 8–27)
Bilirubin Total: 0.7 mg/dL (ref 0.0–1.2)
CO2: 25 mmol/L (ref 20–29)
Calcium: 10.6 mg/dL — ABNORMAL HIGH (ref 8.7–10.3)
Chloride: 102 mmol/L (ref 96–106)
Creatinine, Ser: 1.43 mg/dL — ABNORMAL HIGH (ref 0.57–1.00)
Globulin, Total: 2.5 g/dL (ref 1.5–4.5)
Glucose: 137 mg/dL — ABNORMAL HIGH (ref 70–99)
Potassium: 4.6 mmol/L (ref 3.5–5.2)
Sodium: 141 mmol/L (ref 134–144)
Total Protein: 7.2 g/dL (ref 6.0–8.5)
eGFR: 38 mL/min/{1.73_m2} — ABNORMAL LOW (ref >59)

## 2023-08-24 LAB — TSH+FREE T4
Free T4: 1.7 ng/dL (ref 0.82–1.77)
TSH: 1.96 u[IU]/mL (ref 0.450–4.500)

## 2023-08-24 LAB — CBC
Hematocrit: 37.8 % (ref 34.0–46.6)
Hemoglobin: 12.8 g/dL (ref 11.1–15.9)
MCH: 31.5 pg (ref 26.6–33.0)
MCHC: 33.9 g/dL (ref 31.5–35.7)
MCV: 93 fL (ref 79–97)
Platelets: 197 10*3/uL (ref 150–450)
RBC: 4.06 x10E6/uL (ref 3.77–5.28)
RDW: 11.9 % (ref 11.7–15.4)
WBC: 3.5 10*3/uL (ref 3.4–10.8)

## 2023-08-24 LAB — MICROALBUMIN / CREATININE URINE RATIO
Creatinine, Urine: 74.7 mg/dL
Microalb/Creat Ratio: 7 mg/g{creat} (ref 0–29)
Microalbumin, Urine: 5.5 ug/mL

## 2023-08-24 LAB — HEMOGLOBIN A1C
Est. average glucose Bld gHb Est-mCnc: 146 mg/dL
Hgb A1c MFr Bld: 6.7 % — ABNORMAL HIGH (ref 4.8–5.6)

## 2023-09-05 ENCOUNTER — Other Ambulatory Visit: Payer: Self-pay | Admitting: Nurse Practitioner

## 2023-09-07 ENCOUNTER — Telehealth: Payer: Self-pay | Admitting: Student-PharmD

## 2023-09-07 NOTE — Progress Notes (Signed)
Added to spreadsheet from [employer]. Pt on patient assistance program for Trulicity by [employer] but also taking [place]. May qualify for Farxiga PAP as well? Forwarding.  Sharee Pimple

## 2023-09-09 ENCOUNTER — Other Ambulatory Visit: Payer: Medicare Other

## 2023-09-09 DIAGNOSIS — E785 Hyperlipidemia, unspecified: Secondary | ICD-10-CM | POA: Diagnosis not present

## 2023-09-09 DIAGNOSIS — N183 Chronic kidney disease, stage 3 unspecified: Secondary | ICD-10-CM | POA: Diagnosis not present

## 2023-09-09 DIAGNOSIS — E1122 Type 2 diabetes mellitus with diabetic chronic kidney disease: Secondary | ICD-10-CM | POA: Diagnosis not present

## 2023-09-09 DIAGNOSIS — I129 Hypertensive chronic kidney disease with stage 1 through stage 4 chronic kidney disease, or unspecified chronic kidney disease: Secondary | ICD-10-CM | POA: Diagnosis not present

## 2023-09-10 ENCOUNTER — Other Ambulatory Visit: Payer: Medicare Other

## 2023-09-10 NOTE — Progress Notes (Unsigned)
09/10/2023  Patient ID: Catherine [name], female   DOB: [birth year], 77 y.o.   MRN: [id number]  Trulicity updated to go to MD office moving forward Checking on Farxiga Monday

## 2023-09-14 ENCOUNTER — Other Ambulatory Visit: Payer: Medicare Other

## 2023-09-14 NOTE — Progress Notes (Unsigned)
09/14/2023  Patient ID: Catherine [name], female   DOB: 02-Mar-1946, 77 y.o.   MRN: [id number]  Telephone visit to follow-up on access/affordability of medications  -Patient currently receives Trulicity through [employer] PAP and would like for this medication to be mailed to PCP office if possible due to recent concerns regarding delivery to her home (package thefts in neighborhood and package being left out for extended periods of time) -Patient also qualifies for Farxiga PAP through [employer] and does endorse affordability concerns with this medication.  She would also like for this medication to be delivered to PCP office due to above concerns -[employer] and [employer], and both programs are able to mail PAP medications to provider's office -Coordinating with the practice to see if they are willing/able to receive these shipments and contact patient upon arrival -Will follow-up with patient and pursue Farxiga PAP if desired based on PAP delivery option  [name], PharmD, DPLA

## 2023-09-23 ENCOUNTER — Telehealth: Payer: Self-pay

## 2023-09-23 NOTE — Progress Notes (Signed)
   09/23/2023  Patient ID: Catherine [name], female   DOB: Jan 11, 1946, 77 y.o.   MRN: [id number]  Patient's PCP office is agreeable to receiving PAP medications and notifying patient upon arrival.  Attempted to call patient to inform and see if she would like to pursue Farxiga PAP, but I was not able to reach her or leave a voicemail.  I will try to contact her again in the next few days.  [name], PharmD, DPLA

## 2023-09-24 ENCOUNTER — Other Ambulatory Visit: Payer: Medicare Other

## 2023-09-24 NOTE — Progress Notes (Signed)
   09/24/2023  Patient ID: Catherine [name], female   DOB: [birth year], 77 y.o.   MRN: [id number]  Outreach attempt to follow-up on delivery of PAP medications and discuss Farxiga PAP enrollment.  I was not able to reach the patient, but I was able to leave HIPAA compliant voicemail with my direct phone number for her to return my call.  [name], PharmD, DPLA

## 2023-09-28 ENCOUNTER — Other Ambulatory Visit: Payer: Medicare Other

## 2023-09-28 NOTE — Progress Notes (Signed)
   09/28/2023  Patient ID: Catherine [name], female   DOB: July 15, 1946, 77 y.o.   MRN: [id number]  Patient outreach to follow-up on Trulicity and Farxiga PAP.  Patient is currently enrolled in [employer] for Trulicity 3mg  PAP.  This medication will now ship to PCP office, and they will contact patient upon arrival.  Coordinating with medication assistance team to initiate application process for Farxiga 10mg  daily.  Patient would also like this to be shipped to PCP office based on porch theft concerns in patient's neighborhood.  PCP office endorsed approval for medications to be shipped to their office.  I will monitor progress of Farxiga PAP and keep patient posted.  [name], PharmD, DPLA

## 2023-10-04 ENCOUNTER — Other Ambulatory Visit: Payer: Self-pay

## 2023-10-04 MED ORDER — HYDROCORTISONE (PERIANAL) 2.5 % EX CREA: TOPICAL_CREAM | Freq: Two times a day (BID) | CUTANEOUS | 2 refills | Status: DC

## 2023-10-11 NOTE — Progress Notes (Signed)
   10/11/2023  Patient ID: Catherine [name], female   DOB: 11/15/1946, 77 y.o.   MRN: [id number]  Completed [employer] PAP application for Farxiga 10mg , and requested on application that this be mailed to provider's office if approved.  Mailing patient portion to patient's home, so she can review, sign, date, and return to medication assistance team.  Faxing provider portion to PCP to review, sign, date, and fax to medication assistance team.  [name], PharmD, DPLA

## 2023-10-14 ENCOUNTER — Other Ambulatory Visit: Payer: Self-pay | Admitting: Nurse Practitioner

## 2023-10-14 DIAGNOSIS — E039 Hypothyroidism, unspecified: Secondary | ICD-10-CM

## 2023-10-15 ENCOUNTER — Other Ambulatory Visit: Payer: Self-pay | Admitting: Nurse Practitioner

## 2023-10-15 DIAGNOSIS — E119 Type 2 diabetes mellitus without complications: Secondary | ICD-10-CM

## 2023-10-25 ENCOUNTER — Other Ambulatory Visit: Payer: Self-pay | Admitting: Nurse Practitioner

## 2023-10-25 ENCOUNTER — Telehealth: Payer: Self-pay

## 2023-10-25 DIAGNOSIS — L0292 Furuncle, unspecified: Secondary | ICD-10-CM

## 2023-10-25 NOTE — Progress Notes (Signed)
   10/25/2023  Patient ID: Catherine [name], female   DOB: July 22, 1946, 77 y.o.   MRN: [id number]  Clinic routed request from PCP office stating patient was trying to reach pharmacist regarding PAP programs.  I have been working with the patient on Farxiga PAP, so I tried to call but was unable to reach her.  There was also no voicemail, so I will try to call again later this week.  [name], PharmD, DPLA

## 2023-10-27 ENCOUNTER — Telehealth: Payer: Self-pay

## 2023-10-27 NOTE — Progress Notes (Signed)
   10/27/2023  Patient ID: Catherine [name], female   DOB: [birth year], 77 y.o.   MRN: [id number]  Second outreach attempt to contact patient in response to message from PCP office that she had called requesting a call back from pharmacist regarding patient assistance programs.  I was not able to reach the patient again today, nor was I able to leave a voicemail.  I will try to contact her again Friday or Monday.  [name], PharmD, DPLA

## 2023-11-04 ENCOUNTER — Other Ambulatory Visit: Payer: Self-pay

## 2023-11-04 NOTE — Progress Notes (Signed)
   11/04/2023  Patient ID: Catherine [name], female   DOB: 1946-06-22, 77 y.o.   MRN: [id number]  Patient outreach to follow-up on [employer] PAP application for Farxiga 10mg .  Catherine [name] received patient portion mailed to her and is waiting on updated social security statement to be able to complete [facility] portion.  I mailed a labeled and addressed envelope to return this to the medication assistance team, but she prefers to bring this into the office once completed.  Provider portion was faxed to PCP's office to complete and fax to medication assistance team.  Will follow-up with them to see if this was received and make clinic pharmacist, [name], aware patient will be bringing her portion into [place]'s office once completed.  [name], PharmD, DPLA

## 2023-11-21 ENCOUNTER — Other Ambulatory Visit: Payer: Self-pay | Admitting: Nurse Practitioner

## 2023-11-21 DIAGNOSIS — E039 Hypothyroidism, unspecified: Secondary | ICD-10-CM

## 2023-11-22 ENCOUNTER — Telehealth: Payer: Self-pay

## 2023-11-22 NOTE — Telephone Encounter (Signed)
 PAP: PAP application for Trulicity , [employer]) has been mailed to pt's home address on file. Will fax provider portion of application to provider's office when pt's portion is received.  Please note I have faxed provider portion of application to [facility]

## 2023-11-24 ENCOUNTER — Telehealth: Payer: Self-pay

## 2023-11-24 ENCOUNTER — Ambulatory Visit (INDEPENDENT_AMBULATORY_CARE_PROVIDER_SITE_OTHER): Payer: BC Managed Care – PPO | Admitting: Nurse Practitioner

## 2023-11-24 ENCOUNTER — Encounter: Payer: Self-pay | Admitting: Nurse Practitioner

## 2023-11-24 VITALS — BP 120/70 | HR 77 | Temp 98.4°F | Ht 64.0 in | Wt 161.6 lb

## 2023-11-24 DIAGNOSIS — I129 Hypertensive chronic kidney disease with stage 1 through stage 4 chronic kidney disease, or unspecified chronic kidney disease: Secondary | ICD-10-CM

## 2023-11-24 DIAGNOSIS — E782 Mixed hyperlipidemia: Secondary | ICD-10-CM | POA: Diagnosis not present

## 2023-11-24 DIAGNOSIS — E663 Overweight: Secondary | ICD-10-CM | POA: Diagnosis not present

## 2023-11-24 DIAGNOSIS — Z6827 Body mass index (BMI) 27.0-27.9, adult: Secondary | ICD-10-CM | POA: Diagnosis not present

## 2023-11-24 DIAGNOSIS — D573 Sickle-cell trait: Secondary | ICD-10-CM | POA: Diagnosis not present

## 2023-11-24 DIAGNOSIS — N183 Chronic kidney disease, stage 3 unspecified: Secondary | ICD-10-CM | POA: Diagnosis not present

## 2023-11-24 DIAGNOSIS — N1831 Chronic kidney disease, stage 3a: Secondary | ICD-10-CM

## 2023-11-24 DIAGNOSIS — E1122 Type 2 diabetes mellitus with diabetic chronic kidney disease: Secondary | ICD-10-CM

## 2023-11-24 DIAGNOSIS — Z2821 Immunization not carried out because of patient refusal: Secondary | ICD-10-CM | POA: Diagnosis not present

## 2023-11-24 DIAGNOSIS — E2839 Other primary ovarian failure: Secondary | ICD-10-CM | POA: Diagnosis not present

## 2023-11-24 DIAGNOSIS — E890 Postprocedural hypothyroidism: Secondary | ICD-10-CM

## 2023-11-24 DIAGNOSIS — E039 Hypothyroidism, unspecified: Secondary | ICD-10-CM

## 2023-11-24 NOTE — Progress Notes (Signed)
   11/24/2023  Patient ID: Heron SHAUNNA Hines, female   DOB: 26-Dec-1945, 78 y.o.   MRN: [id number]  Returning missed call/voicemail from patient regarding Farxiga  PAP paperwork.  Patient presented to PCP office yesterday with paperwork but did not leave at office; because she is needing some assistance with completing.  -Patient needing clarification on some things on application:  number on app is home but marked as mobile, preferred communication selected as text and she does not text, name listed as Kamille Toomey not Heron SHAUNNA Hines -Has not filled out income information but has rec'd SS Benefit statement -Insurance changed to [employer] since application was initially pre-filled -Provider also does not recall if their portion faxed to the office previously has been signed and faxed into company  Patient plans to come to PCP office Monday morning to see Catie Rudy for assistance with completing paperwork.  Channing DELENA Mealing, PharmD, DPLA

## 2023-11-24 NOTE — Assessment & Plan Note (Signed)
 She is taking her levothyroxine Mon - Sat, holds on Sunday. Will recheck thyroid levels today

## 2023-11-24 NOTE — Assessment & Plan Note (Signed)
 Blood pressure is well controlled, continue current medications.

## 2023-11-24 NOTE — Assessment & Plan Note (Signed)
Cholesterol levels are stable, continue statin tolerating well.

## 2023-11-24 NOTE — Progress Notes (Addendum)
 Catherine [name], CMA,acting as a neurosurgeon for Catherine Ada, FNP.,have documented all relevant documentation on the behalf of Catherine Ada, FNP,as directed by  Catherine Ada, FNP while in the presence of Catherine Ada, FNP.  Subjective:  Patient ID: Catherine [name] , female    DOB: 08-09-1946 , 78 y.o.   MRN: [id number]  Chief Complaint  Patient presents with   Hypertension    HPI  Patient presents today for a bp and dm follow up, Patient reports compliance with medication. Patient denies any chest pain, SOB, or headaches. Patient has no concerns today.   Hypertension This is a chronic problem. The current episode started more than 1 year ago. The problem is controlled. Pertinent negatives include no anxiety or headaches. There are no associated agents to hypertension. Risk factors for coronary artery disease include diabetes mellitus, obesity and dyslipidemia. There are no compliance problems.  There is no history of chronic renal disease.  Diabetes She presents for her follow-up diabetic visit. She has type 2 diabetes mellitus. Her disease course has been stable. Pertinent negatives for hypoglycemia include no dizziness or headaches. There are no diabetic associated symptoms. There are no hypoglycemic complications. There are no diabetic complications. Risk factors for coronary artery disease include sedentary lifestyle, hypertension, diabetes mellitus and dyslipidemia. Current diabetic treatment includes oral agent (dual therapy). She is following a diabetic diet. When asked about meal planning, she reported none. She has not had a previous visit with a dietitian. She participates in exercise every other day. Her home blood glucose trend is decreasing steadily. (Blood sugar 121-155, tolerating medications well) An ACE inhibitor/angiotensin II receptor blocker is being taken. She does not see a podiatrist.Eye exam is not current (She will schedule an appt August 18th).     Past Medical History:   Diagnosis Date   CAP (community acquired pneumonia)--treated 08/23/2014   Diabetes mellitus without complication (HCC)    DKA (diabetic ketoacidoses) 08/13/2014   DKA, type 2 (HCC) 08/13/2014   Hordeolum externum of left upper eyelid 12/14/2018   Hypertension    Obesity 03/05/2014   Pneumonia 08/13/2014   Rectal bleeding 02/24/2023   Thyroid  disease      Family History  Problem Relation Age of Onset   Diabetes Mellitus II Mother    Stroke Mother    Diabetes Mellitus II Sister    Hypertension Sister      Current Outpatient Medications:    Accu-Chek FastClix Lancets MISC, AS DIRECTED TWICE A DAY SUBCUTANEOUS 90 DAYS E11.9, Disp: 204 each, Rfl: 4   ACCU-CHEK GUIDE test strip, USE TO CHECK BLOOD SUGARS TWICE DAILY E11.69, Disp: 100 strip, Rfl: 2   Ascorbic Acid (VITAMIN C) 1000 MG tablet, Take 1,000 mg by mouth daily., Disp: , Rfl:    aspirin  81 MG tablet, Take 81 mg by mouth daily., Disp: , Rfl:    atorvastatin  (LIPITOR) 10 MG tablet, TAKE 1 TABLET BY MOUTH EVERY DAY, Disp: 90 tablet, Rfl: 0   calcium  citrate-vitamin D  (CITRACAL+D) 315-200 MG-UNIT tablet, Take 1 tablet by mouth 2 (two) times daily., Disp: , Rfl:    Cyanocobalamin  (VITAMIN B 12 PO), Take 1,000 mg by mouth daily., Disp: , Rfl:    Dulaglutide  (TRULICITY ) 3 MG/0.5ML SOPN, Inject 3 mg as directed once a week., Disp: 12 mL, Rfl: 1   FARXIGA  10 MG TABS tablet, TAKE 1 TABLET BY MOUTH EVERY DAY BEFORE BREAKFAST, Disp: 90 tablet, Rfl: 1   ferrous sulfate  325 (65 FE) MG tablet, Take 1 tablet (  325 mg total) by mouth 2 (two) times daily., Disp: 180 tablet, Rfl: 1   hydrocortisone  (ANUSOL -HC) 2.5 % rectal cream, Place rectally 2 (two) times daily., Disp: 30 g, Rfl: 2   levothyroxine  (SYNTHROID ) 112 MCG tablet, TAKE ONE TABLET MONDAY THROUGH SATURDAY, Disp: 78 tablet, Rfl: 1   lisinopril  (ZESTRIL ) 2.5 MG tablet, TAKE 1 TABLET BY MOUTH EVERY DAY, Disp: 90 tablet, Rfl: 1   Multiple Vitamin (MULTIVITAMIN WITH MINERALS) TABS tablet,  Take 1 tablet by mouth daily., Disp: , Rfl:    mupirocin  ointment (BACTROBAN ) 2 %, APPLY TO AFFECTED AREA TWICE A DAY, Disp: 22 g, Rfl: 0   Omega-3 Fatty Acids (FISH OIL) 1000 MG CAPS, Take 1 capsule by mouth daily. , Disp: , Rfl:    Wheat Dextrin (BENEFIBER DRINK MIX PO), Take by mouth., Disp: , Rfl:    No Known Allergies   Review of Systems  Constitutional: Negative.   Respiratory: Negative.    Cardiovascular: Negative.   Neurological: Negative.  Negative for dizziness and headaches.  Psychiatric/Behavioral: Negative.    All other systems reviewed and are negative.    Today's Vitals   11/24/23 1119  BP: 120/70  Pulse: 77  Temp: 98.4 F (36.9 C)  TempSrc: Oral  Weight: 161 lb 9.6 oz (73.3 kg)  Height: 5' 4 (1.626 m)  PainSc: 0-No pain   Body mass index is 27.74 kg/m.  Wt Readings from Last 3 Encounters:  11/24/23 161 lb 9.6 oz (73.3 kg)  08/23/23 159 lb 6.4 oz (72.3 kg)  02/25/23 159 lb (72.1 kg)     Objective:  Physical Exam Vitals reviewed.  Constitutional:      General: She is not in acute distress.    Appearance: Normal appearance.  Cardiovascular:     Rate and Rhythm: Normal rate and regular rhythm.     Pulses: Normal pulses.     Heart sounds: Normal heart sounds. No murmur heard. Pulmonary:     Effort: Pulmonary effort is normal. No respiratory distress.     Breath sounds: Normal breath sounds. No wheezing.  Skin:    General: Skin is warm and dry.     Capillary Refill: Capillary refill takes less than 2 seconds.  Neurological:     General: No focal deficit present.     Mental Status: She is alert and oriented to person, place, and time.     Cranial Nerves: No cranial nerve deficit.     Motor: No weakness.  Psychiatric:        Mood and Affect: Mood normal.        Behavior: Behavior normal.        Thought Content: Thought content normal.        Judgment: Judgment normal.         Assessment And Plan:  Type 2 diabetes mellitus with stage 3a  chronic kidney disease, without long-term current use of insulin  (HCC) Assessment & Plan: HgbA1c increased slightly at last visit, continue focusing on healthy diet and important to get more activity.  Blood sugars are averaging 124-144. She is in the process of renewing her trulicity  and has her forms will contact pharmacist.   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   Orders: -     BMP8+eGFR -  Microalbumin / creatinine urine ratio  Mixed hyperlipidemia Assessment & Plan: Cholesterol levels are stable, continue statin tolerating well   Hypothyroidism, postablative Assessment & Plan: Will recheck thyroid  levels. She is taking her medications daily except on Sunday  Orders: -     TSH + free T4  COVID-19 vaccination declined Assessment & Plan: Declines covid 19 vaccine. Discussed risk of covid 47 and if she changes her mind about the vaccine to call the office. Education has been provided regarding the importance of this vaccine but patient still declined. Advised may receive this vaccine at local pharmacy or Health Dept.or vaccine clinic. Aware to provide a copy of the vaccination record if obtained from local pharmacy or Health Dept.  Encouraged to take multivitamin, vitamin d , vitamin c and zinc to increase immune system. Aware can call office if would like to have vaccine here at office. Verbalized acceptance and understanding.    Overweight with body mass index (BMI) of 27 to 27.9 in adult  Sickle-cell trait (HCC) Assessment & Plan: No issues   Decreased estrogen level Assessment & Plan: Order for bone density for [facility]  Orders: -     DG Bone Density; Future   Return for controlled DM check 4 months.  Patient was given opportunity to ask questions. Patient  verbalized understanding of the plan and was able to repeat key elements of the plan. All questions were answered to their satisfaction.    Catherine Catherine Ada, FNP, have reviewed all documentation for this visit. The documentation on 12/07/23 for the exam, diagnosis, procedures, and orders are all accurate and complete.   IF YOU HAVE BEEN REFERRED TO A SPECIALIST, IT MAY TAKE 1-2 WEEKS TO SCHEDULE/PROCESS THE REFERRAL. IF YOU HAVE NOT HEARD FROM US /SPECIALIST IN TWO WEEKS, PLEASE GIVE US  A CALL AT [phone].

## 2023-11-24 NOTE — Assessment & Plan Note (Signed)
 Order for bone density for [facility]

## 2023-11-24 NOTE — Assessment & Plan Note (Addendum)
 HgbA1c increased slightly at last visit, continue focusing on healthy diet and important to get more activity.  Blood sugars are averaging 124-144. She is in the process of renewing her trulicity and has her forms will contact pharmacist.

## 2023-11-24 NOTE — Assessment & Plan Note (Signed)
 No issues

## 2023-11-24 NOTE — Assessment & Plan Note (Signed)

## 2023-11-24 NOTE — Assessment & Plan Note (Signed)
 Will recheck thyroid levels. She is taking her medications daily except on Sunday

## 2023-11-25 LAB — BMP8+EGFR
BUN/Creatinine Ratio: 17 (ref 12–28)
BUN: 25 mg/dL (ref 8–27)
CO2: 22 mmol/L (ref 20–29)
Calcium: 9.9 mg/dL (ref 8.7–10.3)
Chloride: 99 mmol/L (ref 96–106)
Creatinine, Ser: 1.48 mg/dL — ABNORMAL HIGH (ref 0.57–1.00)
Glucose: 173 mg/dL — ABNORMAL HIGH (ref 70–99)
Potassium: 4.3 mmol/L (ref 3.5–5.2)
Sodium: 142 mmol/L (ref 134–144)
eGFR: 36 mL/min/{1.73_m2} — ABNORMAL LOW (ref >59)

## 2023-11-25 LAB — TSH+FREE T4
Free T4: 1.72 ng/dL (ref 0.82–1.77)
TSH: 3.9 u[IU]/mL (ref 0.450–4.500)

## 2023-11-25 LAB — HEMOGLOBIN A1C
Est. average glucose Bld gHb Est-mCnc: 183 mg/dL
Hgb A1c MFr Bld: 8 % — ABNORMAL HIGH (ref 4.8–5.6)

## 2023-11-25 LAB — MICROALBUMIN / CREATININE URINE RATIO
Creatinine, Urine: 104.9 mg/dL
Microalb/Creat Ratio: 8 mg/g{creat} (ref 0–29)
Microalbumin, Urine: 8.2 ug/mL

## 2023-12-01 ENCOUNTER — Telehealth: Payer: Self-pay

## 2023-12-01 NOTE — Progress Notes (Signed)
   12/01/2023  Patient ID: Catherine [name], female   DOB: 1945-11-29, 78 y.o.   MRN: [id number]  Received a voicemail from the patient stating she has questions about the other patient assistance application she has recently received in the mail. I had been working to try to assist her with AZ&Me patient assistance application for Farxiga . Patient was going to see [name] the pharmacist with [facility] for further assistance with AZ&Me application.  Forwarding this message to pharmacy technician helping with 2025 r- enrollment for her Trulicity  as well as Catie for further assistance.  [name], PharmD, DPLA

## 2023-12-02 ENCOUNTER — Other Ambulatory Visit: Payer: Self-pay | Admitting: Nurse Practitioner

## 2023-12-02 NOTE — Progress Notes (Signed)
   12/02/2023  Patient ID: Catherine [name], female   DOB: [birth year], 78 y.o.   MRN: [id number]  Received a call from patient stating she was not able to go in to get assistance with patient assistance paperwork on Monday while [name] was in the office.  She is requesting someone to review her patient assistance applications for [employer] cares which she receives Trulicity from as well as AZ&Me for Farxiga 10 mg tablets daily.  Catie will not be back in the office for several weeks.  Contacting a member of the primary care provider's staff to see if they would have time in the next few days to go over the paperwork with the patient and return this in to the medication assistance team.  Will contact the patient once I have a better idea of availability.  [name], PharmD, DPLA

## 2023-12-13 ENCOUNTER — Telehealth: Payer: Self-pay | Admitting: Pharmacist

## 2023-12-13 NOTE — Progress Notes (Signed)
Contacted patient regarding patient assistance. She requested that I call her back around 1 pm to discuss how to fill out [employer] and [employer] patient assistance applications.   Catie Eppie Gibson, PharmD, BCACP, CPP Clinical Pharmacist [facility] [phone]

## 2023-12-13 NOTE — Telephone Encounter (Signed)
Pt. Has appointment with Catie to help her fill out her application 1/27

## 2023-12-13 NOTE — Progress Notes (Signed)
Spoke with patient. Assisted her in completing applications over the phone. She will bring by the front desk of [facility].  Once received, please fax to patient advocate team at [phone].

## 2023-12-20 DIAGNOSIS — M8588 Other specified disorders of bone density and structure, other site: Secondary | ICD-10-CM | POA: Diagnosis not present

## 2023-12-20 DIAGNOSIS — Z1231 Encounter for screening mammogram for malignant neoplasm of breast: Secondary | ICD-10-CM | POA: Diagnosis not present

## 2023-12-20 LAB — HM DEXA SCAN

## 2023-12-20 LAB — HM MAMMOGRAPHY

## 2023-12-21 ENCOUNTER — Other Ambulatory Visit: Payer: Self-pay | Admitting: Nurse Practitioner

## 2023-12-21 DIAGNOSIS — E119 Type 2 diabetes mellitus without complications: Secondary | ICD-10-CM

## 2023-12-21 MED ORDER — TRULICITY 4.5 MG/0.5ML ~~LOC~~ SOAJ: 4.5000 mg | SUBCUTANEOUS | Status: DC

## 2023-12-21 MED ORDER — TRULICITY 4.5 MG/0.5ML ~~LOC~~ SOAJ: 4.5000 mg | SUBCUTANEOUS | 3 refills | Status: AC

## 2023-12-21 NOTE — Progress Notes (Signed)
We will increase her Trulicity, I will send a message to the pharmacist because I believe she gets this through patient assistance.

## 2023-12-22 ENCOUNTER — Telehealth: Payer: Self-pay | Admitting: Pharmacist

## 2023-12-22 ENCOUNTER — Encounter: Payer: Self-pay | Admitting: Nurse Practitioner

## 2023-12-22 DIAGNOSIS — Z5986 Financial insecurity: Secondary | ICD-10-CM

## 2023-12-22 NOTE — Progress Notes (Signed)
   12/22/2023  Patient ID: Catherine [name], female   DOB: 09-09-1946, 78 y.o.   MRN: [id number]  T  Reason for referral: Medication Assistance   Reason for call: Patient called requesting a call back about medication assistance with Trulicity .  Outreach:  Unsuccessful telephone call attempt #1 to patient.   Unable to leave message-called multiple times. It was busy then rang >20 times with no ability to leave a voicemail x3   Plan:  -I will make another outreach attempt to patient within 3-4 business days.   Cassius DOROTHA Brought, PharmD, BCACP Clinical Pharmacist [phone]

## 2023-12-22 NOTE — Telephone Encounter (Signed)
 Received patient application for Farxiga  PAP from office.  Faxed provider portion to Susanna Epley for review

## 2023-12-22 NOTE — Telephone Encounter (Signed)
 PAP: Application for Trulicity has been submitted to [employer], via fax

## 2023-12-23 ENCOUNTER — Encounter: Payer: Self-pay | Admitting: Pharmacist

## 2023-12-23 DIAGNOSIS — Z79899 Other long term (current) drug therapy: Secondary | ICD-10-CM

## 2023-12-23 NOTE — Telephone Encounter (Signed)
 PAP: Patient assistance application for Trulicity  has been approved by PAP Companies: [employer] from 12/22/2023 to 11/15/2024. Medication should be delivered to PAP Delivery: Home. For further shipping updates, please contact [employer] at [phone]. Patient ID is: [id number]

## 2023-12-23 NOTE — Progress Notes (Deleted)
 12/23/2023 Name: Catherine [name] MRN: [id number] DOB: 1946/03/20  No chief complaint on file.   Catherine [name] is a 78 y.o. year old female who presented for a telephone visit.   They were referred to the pharmacist by their PCP for assistance in managing diabetes.   Patient called the office wondering about the status of her Patient Assistance Applications and she wanted to request that her medications be sent to the office versus her home because [place] busy road and people steal packages.   Subjective:  Care Team: Primary Care Provider: Georgina Speaks, FNP ; Next Scheduled Visit: 03/23/2024  Medication Access/Adherence  Current Pharmacy:  [id number] - [place], [place] - [address] AT [facility] [address] [place] [address] Phone: [phone] Fax: [phone]  [facility] - [place], [place] - [address] [place] [address] Phone: [phone] Fax: [phone]   Patient reports affordability concerns with their medications: Yes -receiving patient assistance Patient reports access/transportation concerns to their pharmacy: No  Patient reports adherence concerns with their medications:  No      Diabetes:  Current medications:  Medications tried in the past:   Current glucose readings: 154  Patient denies hypoglycemic or hyperglycemic symptoms.  Current meal patterns:  - Breakfast: cheerios, coffee with hazel nut cream, sausage biscuit, oatmeal,  - Lunch often skips, peanut butter crackers, open faced peanut butter sandwich,  - Supper cooks daily--lots of soup vegetable beef or chicken - Snacks corn chips, potato chips, nilla wafers, banana pudding (home made), cookies,fruit - Drinks lemon water , tea (diet), sodas zero sugar, coffee  Current physical activity: being a care giver  Objective:  Lab Results  Component Value Date   HGBA1C 8.0  (H) 11/24/2023    Lab Results  Component Value Date   CREATININE 1.48 (H) 11/24/2023   BUN 25 11/24/2023   NA 142 11/24/2023   K 4.3 11/24/2023   CL 99 11/24/2023   CO2 22 11/24/2023    Lab Results  Component Value Date   CHOL 160 08/23/2023   HDL 66 08/23/2023   LDLCALC 77 08/23/2023   TRIG 93 08/23/2023   CHOLHDL 2.4 08/23/2023    Medications Reviewed Today     Reviewed by Jolee Cassius PARAS, [facility] (Pharmacist) on 12/23/23 at [phone]  Med List Status: <None>   Medication Order Taking? Sig Documenting Provider Last Dose Status Informant  Accu-Chek FastClix Lancets MISC [id number] Yes AS DIRECTED TWICE A DAY SUBCUTANEOUS 90 DAYS E11.9 Georgina Speaks, FNP Taking Active   ACCU-CHEK GUIDE test strip [id number] Yes USE TO CHECK BLOOD SUGARS TWICE DAILY E11.69 Georgina Speaks, FNP Taking Active   Ascorbic Acid (VITAMIN C) 1000 MG tablet [id number] Yes Take 1,000 mg by mouth daily. [provider] Taking Active   aspirin  81 MG tablet [id number] Yes Take 81 mg by mouth daily. [provider] Taking Active Multiple Informants  atorvastatin  (LIPITOR) 10 MG tablet [id number] Yes TAKE 1 TABLET BY MOUTH EVERY DAY Georgina Speaks, FNP Taking Active   calcium  citrate-vitamin D  (CITRACAL+D) 315-200 MG-UNIT tablet [id number] Yes Take 1 tablet by mouth 2 (two) times daily. [provider] Taking Active   Cyanocobalamin  (VITAMIN B 12 PO) [id number] Yes Take 1,000 mg by mouth daily. [provider] Taking Active Multiple Informants  Dulaglutide  (TRULICITY ) 4.5 MG/0.5ML SOAJ [id number] Yes Inject 4.5 mg as directed once a week. Georgina Speaks, FNP Taking Active  FARXIGA  10 MG TABS tablet [id number] Yes TAKE 1 TABLET BY MOUTH EVERY DAY BEFORE OFILIA Georgina Speaks, FNP Taking Active            Med Note ZENA, CHERYL A   Thu Nov 04, 2023 10:28 AM) Working on PAP  ferrous sulfate  325 (65 FE) MG tablet [id number] Yes Take 1 tablet (325 mg total) by mouth 2 (two) times daily. Georgina Speaks, FNP Taking Active   hydrocortisone  (ANUSOL -HC) 2.5 % rectal cream [id number] Yes Place rectally 2 (two) times daily. Georgina Speaks, FNP Taking Active   levothyroxine  (SYNTHROID ) 112 MCG tablet [id number] Yes TAKE ONE TABLET MONDAY THROUGH [place] Georgina Speaks, FNP Taking Active   lisinopril  (ZESTRIL ) 2.5 MG tablet [id number] Yes TAKE 1 TABLET BY MOUTH EVERY DAY Georgina Speaks, FNP Taking Active   Multiple Vitamin (MULTIVITAMIN WITH MINERALS) TABS tablet [id number] Yes Take 1 tablet by mouth daily. [provider] Taking Active   mupirocin  ointment (BACTROBAN ) 2 % [id number] Yes APPLY TO AFFECTED AREA TWICE A DAY Georgina Speaks, FNP Taking Active   Omega-3 Fatty Acids (FISH OIL) 1000 MG CAPS [id number] Yes Take 1 capsule by mouth daily.  [provider] Taking Active Self  Wheat Dextrin (BENEFIBER DRINK MIX PO) [id number] Yes Take by mouth. [provider] Taking Active               Assessment/Plan:   Diabetes: - Currently uncontrolled A1c 8% - Reviewed long term cardiovascular and renal outcomes of uncontrolled blood sugar - Reviewed goal A1c, goal fasting, and goal 2 hour post prandial glucose - Reviewed dietary modifications including watching carbohydrate consumption - Reviewed lifestyle modifications including: doing more walking  - Meets financial criteria for Farxiga  and Trulicity   patient assistance programs. She has been approved for Trulicity  and is awaiting delivery. Farxiga  forms were sent to the Provider's office for signature  Confirmed with [facility], Trulicity  will be delivered to the patient's home as she requested.  Follow Up Plan: Follow up on med assistance delivery in 2 weeks.  Cassius DOROTHA Brought, PharmD, BCACP Clinical Pharmacist [phone]

## 2023-12-27 ENCOUNTER — Other Ambulatory Visit: Payer: Self-pay | Admitting: Pharmacist

## 2023-12-27 NOTE — Progress Notes (Signed)
 error    This encounter was created in error - please disregard.

## 2023-12-27 NOTE — Progress Notes (Addendum)
 (Reprint of note from 12/23/2023  Patient ID: Catherine [name], female   DOB: [birth year], 78 y.o.   MRN: [id number]  12/23/2023 Name: Catherine [name]    MRN: [id number]       DOB: 1946-07-21   No chief complaint on file.     Catherine [name] is a 78 y.o. year old female who presented for a telephone visit.   They were referred to the pharmacist by their PCP for assistance in managing diabetes.    Patient called the office wondering about the status of her Patient Assistance Applications and she wanted to request that her medications be sent to the office versus her home because [place] busy road and people steal packages.     Subjective:   Care Team: Primary Care Provider: [name], FNP ; Next Scheduled Visit: 03/23/2024   Medication Access/Adherence   Current Pharmacy:  [id number] - [place], [place] - [address] AT [facility] [address] [place] [address] Phone: [phone] Fax: [phone]   [facility] - [place], [place] - [address] [place] [address] Phone: [phone] Fax: [phone]     Patient reports affordability concerns with their medications: Yes -receiving patient assistance Patient reports access/transportation concerns to their pharmacy: No  Patient reports adherence concerns with their medications:  No        Diabetes:   Current medications:  Medications tried in the past:    Current glucose readings: 154   Patient denies hypoglycemic or hyperglycemic symptoms.   Current meal patterns:  - Breakfast: cheerios, coffee with hazel nut cream, sausage biscuit, oatmeal,  - Lunch often skips, peanut butter crackers, open faced peanut butter sandwich,  - Supper cooks daily--lots of soup vegetable beef or chicken - Snacks corn chips, potato chips, nilla wafers, banana pudding (home made), cookies,fruit - Drinks lemon water, tea  (diet), sodas zero sugar, coffee   Current physical activity: being a care giver   Objective:   Recent Labs Lab Results Component Value Date   HGBA1C 8.0 (H) 11/24/2023      Recent Labs Lab Results Component Value Date  CREATININE 1.48 (H) 11/24/2023   BUN 25 11/24/2023   NA 142 11/24/2023   K 4.3 11/24/2023   CL 99 11/24/2023   CO2 22 11/24/2023      Recent Labs Lab Results Component Value Date   CHOL 160 08/23/2023   HDL 66 08/23/2023   LDLCALC 77 08/23/2023   TRIG 93 08/23/2023   CHOLHDL 2.4 08/23/2023      Medications Reviewed Today       Reviewed by Catherine [name], [facility] (Pharmacist) on 12/23/23 at [phone]   Medications Reviewed Today     Reviewed by Catherine [name], RPH (Pharmacist) on 12/27/23 at 1201  Med List Status: <None>   Medication Order Taking? Sig Documenting Provider Last Dose Status Informant  Accu-Chek FastClix Lancets MISC [id number] Yes AS DIRECTED TWICE A DAY SUBCUTANEOUS 90 DAYS E11.9 Catherine Felts, FNP Taking Active   ACCU-CHEK GUIDE test strip [id number] Yes USE TO CHECK BLOOD SUGARS TWICE DAILY E11.69 Catherine Felts, FNP Taking Active   Ascorbic Acid (VITAMIN C) 1000 MG tablet [id number] Yes Take 1,000 mg by mouth daily. [provider] Taking Active   aspirin 81 MG tablet [id number] Yes Take 81 mg by mouth daily. [provider] Taking Active Multiple Informants  atorvastatin (LIPITOR) 10 MG tablet  [id number] Yes TAKE 1 TABLET BY MOUTH EVERY DAY Catherine Felts, FNP Taking Active   calcium citrate-vitamin D (CITRACAL+D) 315-200 MG-UNIT tablet [id number] Yes Take 1 tablet by mouth 2 (two) times daily. [provider] Taking Active   Cyanocobalamin (VITAMIN B 12 PO) [id number] Yes Take 1,000 mg by mouth daily. [provider] Taking Active Multiple Informants  Dulaglutide (TRULICITY) 4.5 MG/0.5ML SOAJ [id number] Yes Inject 4.5 mg as directed once a week. Catherine Felts, FNP Taking Active   FARXIGA 10 MG TABS tablet  [id number] Yes TAKE 1 TABLET BY MOUTH EVERY DAY BEFORE Catherine Hurter, FNP Taking Active            Med Note Littie [name], Catherine A   Thu Nov 04, 2023 10:28 AM) Working on PAP  ferrous sulfate 325 (65 FE) MG tablet [id number] Yes Take 1 tablet (325 mg total) by mouth 2 (two) times daily. Catherine Felts, FNP Taking Active   hydrocortisone (ANUSOL-HC) 2.5 % rectal cream [id number] Yes Place rectally 2 (two) times daily. Catherine Felts, FNP Taking Active   levothyroxine (SYNTHROID) 112 MCG tablet [id number] Yes TAKE ONE TABLET MONDAY THROUGH Charlott Holler, FNP Taking Active   lisinopril (ZESTRIL) 2.5 MG tablet [id number] Yes TAKE 1 TABLET BY MOUTH EVERY DAY Catherine Felts, FNP Taking Active   Multiple Vitamin (MULTIVITAMIN WITH MINERALS) TABS tablet [id number] Yes Take 1 tablet by mouth daily. [provider] Taking Active   mupirocin ointment (BACTROBAN) 2 % [id number] Yes APPLY TO AFFECTED AREA TWICE A DAY Catherine Felts, FNP Taking Active   Omega-3 Fatty Acids (FISH OIL) 1000 MG CAPS [id number] Yes Take 1 capsule by mouth daily.  [provider] Taking Active Self  Wheat Dextrin (BENEFIBER DRINK MIX PO) [id number] Yes Take by mouth. [provider] Taking Active            Diabetes: - Currently uncontrolled A1c 8% - Reviewed long term cardiovascular and renal outcomes of uncontrolled blood sugar - Reviewed goal A1c, goal fasting, and goal 2 hour post prandial glucose - Reviewed dietary modifications including watching carbohydrate consumption - Reviewed lifestyle modifications including: doing more walking  - Meets financial criteria for [place] and Trulicity  patient assistance programs. She has been approved for Trulicity and is awaiting delivery. Catherine [name] forms were sent to the Provider's office for signature   Confirmed with Catherine [name], Trulicity will be delivered to the office as she requested.   Follow Up Plan: Follow up on med assistance delivery in 2 weeks.    Catherine [name], PharmD, BCACP Clinical Pharmacist [phone]

## 2023-12-31 ENCOUNTER — Telehealth: Payer: Self-pay | Admitting: Pharmacist

## 2023-12-31 DIAGNOSIS — N1831 Chronic kidney disease, stage 3a: Secondary | ICD-10-CM

## 2023-12-31 NOTE — Telephone Encounter (Signed)
PAP: Patient assistance application for Marcelline Deist has been approved by PAP Companies: [place] from 12/31/2023 to 11/15/2024. Medication should be delivered to PAP Delivery: Provider's office. For further shipping updates, please contact [employer] ([employer]) at [phone]. Patient ID is: [id number]

## 2023-12-31 NOTE — Progress Notes (Signed)
   12/31/2023  Patient ID: Catherine [name], female   DOB: 02/05/1946, 78 y.o.   MRN: [id number]  Patient called yesterday to inquire about the letter she received from [employer] Patient Assistance Program.  HIPAA identifiers were obtained.  Patient has been approved to receive Trulicity from [employer] and [place] through [employer] until 11/15/2024.  Both companies with ship medications with in the next few weeks.  Patient communicated understanding.  Beecher Mcardle, PharmD, BCACP Clinical Pharmacist [phone]

## 2023-12-31 NOTE — Telephone Encounter (Signed)
PAP: Application for Catherine [name] has been submitted to [employer] (AZ&Me), via fax

## 2024-01-12 ENCOUNTER — Telehealth: Payer: Self-pay | Admitting: Pharmacist

## 2024-01-12 DIAGNOSIS — E1122 Type 2 diabetes mellitus with diabetic chronic kidney disease: Secondary | ICD-10-CM

## 2024-01-12 NOTE — Progress Notes (Signed)
   01/12/2024  Patient ID: Catherine [name], female   DOB: 06-22-1946, 78 y.o.   MRN: [id number]  [facility] Quality Pharmacy Team  [facility]   01/12/2024  Catherine [name] 1946-03-05 [id number]   Reason for referral: Medication Assistance  Referral source:  [name], FNP   Reason for call: Marcelline Deist delivered to the Office  Outreach:  Unsuccessful telephone call attempt #1 to patient.   Unable to leave message  Plan:  -I will make another outreach attempt to patient in 1-2 business days.  Gave medication to the front desk to reach out to the Patient.  Beecher Mcardle, PharmD, BCACP Clinical Pharmacist [phone]

## 2024-02-16 DIAGNOSIS — E663 Overweight: Secondary | ICD-10-CM | POA: Diagnosis not present

## 2024-02-16 DIAGNOSIS — N183 Chronic kidney disease, stage 3 unspecified: Secondary | ICD-10-CM | POA: Diagnosis not present

## 2024-02-16 DIAGNOSIS — E039 Hypothyroidism, unspecified: Secondary | ICD-10-CM | POA: Diagnosis not present

## 2024-02-16 DIAGNOSIS — E785 Hyperlipidemia, unspecified: Secondary | ICD-10-CM | POA: Diagnosis not present

## 2024-02-16 DIAGNOSIS — E1122 Type 2 diabetes mellitus with diabetic chronic kidney disease: Secondary | ICD-10-CM | POA: Diagnosis not present

## 2024-02-16 DIAGNOSIS — K5900 Constipation, unspecified: Secondary | ICD-10-CM | POA: Diagnosis not present

## 2024-02-16 DIAGNOSIS — I129 Hypertensive chronic kidney disease with stage 1 through stage 4 chronic kidney disease, or unspecified chronic kidney disease: Secondary | ICD-10-CM | POA: Diagnosis not present

## 2024-02-16 DIAGNOSIS — D509 Iron deficiency anemia, unspecified: Secondary | ICD-10-CM | POA: Diagnosis not present

## 2024-02-16 DIAGNOSIS — E1165 Type 2 diabetes mellitus with hyperglycemia: Secondary | ICD-10-CM | POA: Diagnosis not present

## 2024-02-16 DIAGNOSIS — E1142 Type 2 diabetes mellitus with diabetic polyneuropathy: Secondary | ICD-10-CM | POA: Diagnosis not present

## 2024-02-16 DIAGNOSIS — R011 Cardiac murmur, unspecified: Secondary | ICD-10-CM | POA: Diagnosis not present

## 2024-02-16 DIAGNOSIS — E1169 Type 2 diabetes mellitus with other specified complication: Secondary | ICD-10-CM | POA: Diagnosis not present

## 2024-02-29 ENCOUNTER — Other Ambulatory Visit: Payer: Self-pay | Admitting: Nurse Practitioner

## 2024-02-29 DIAGNOSIS — I10 Essential (primary) hypertension: Secondary | ICD-10-CM

## 2024-03-02 DIAGNOSIS — E785 Hyperlipidemia, unspecified: Secondary | ICD-10-CM | POA: Diagnosis not present

## 2024-03-02 DIAGNOSIS — N1832 Chronic kidney disease, stage 3b: Secondary | ICD-10-CM | POA: Diagnosis not present

## 2024-03-02 DIAGNOSIS — E1122 Type 2 diabetes mellitus with diabetic chronic kidney disease: Secondary | ICD-10-CM | POA: Diagnosis not present

## 2024-03-02 DIAGNOSIS — I129 Hypertensive chronic kidney disease with stage 1 through stage 4 chronic kidney disease, or unspecified chronic kidney disease: Secondary | ICD-10-CM | POA: Diagnosis not present

## 2024-03-15 ENCOUNTER — Ambulatory Visit

## 2024-03-15 DIAGNOSIS — Z0000 Encounter for general adult medical examination without abnormal findings: Secondary | ICD-10-CM

## 2024-03-15 NOTE — Patient Instructions (Signed)
 Catherine [name] , Thank you for taking time to come for your Medicare Wellness Visit. I appreciate your ongoing commitment to your health goals. Please review the following plan we discussed and let me know if I can assist you in the future.   Referrals/Orders/Follow-Ups/Clinician Recommendations: none  This is a list of the screening recommended for you and due dates:  Health Maintenance  Topic Date Due   Eye exam for diabetics  07/04/2023   COVID-19 Vaccine (8 - 2024-25 season) 10/18/2023   Hemoglobin A1C  05/23/2024   Flu Shot  06/16/2024   Complete foot exam   08/22/2024   Yearly kidney function blood test for diabetes  11/23/2024   Yearly kidney health urinalysis for diabetes  11/23/2024   Mammogram  12/19/2024   Medicare Annual Wellness Visit  03/15/2025   DEXA scan (bone density measurement)  12/19/2025   DTaP/Tdap/Td vaccine (2 - Td or Tdap) 09/17/2029   Pneumonia Vaccine  Completed   Hepatitis C Screening  Completed   Zoster (Shingles) Vaccine  Completed   HPV Vaccine  Aged Out   Meningitis B Vaccine  Aged Out   Colon Cancer Screening  Discontinued    Advanced directives: (ACP Link)Information on Advanced Care Planning can be found at [place]  Secretary of [facility] Advance Health Care Directives Advance Health Care Directives. [email or web address]   Next Medicare Annual Wellness Visit scheduled for next year: No, office will schedule  insert Preventive Care attachment Insert FALL PREVENTION attachment if needed

## 2024-03-15 NOTE — Progress Notes (Signed)
 Subjective:   Catherine [name] is a 78 y.o. who presents for a Medicare Wellness preventive visit.  Visit Complete: Virtual I connected with  Catherine [name] on 03/15/24 by a audio enabled telemedicine application and verified that I am speaking with the correct person using two identifiers.  Patient Location: Home  Provider Location: Office/Clinic  I discussed the limitations of evaluation and management by telemedicine. The patient expressed understanding and agreed to proceed.  Vital Signs: Because this visit was a virtual/telehealth visit, some criteria may be missing or patient reported. Any vitals not documented were not able to be obtained and vitals that have been documented are patient reported.  VideoError- Librarian, academic were attempted between this provider and patient, however failed, due to patient having technical difficulties OR patient did not have access to video capability.  We continued and completed visit with audio only.   Persons Participating in Visit: Patient.  AWV Questionnaire: No: Patient Medicare AWV questionnaire was not completed prior to this visit.  Cardiac Risk Factors include: advanced age (>92men, >16 women);diabetes mellitus;hypertension     Objective:    Today's Vitals   There is no height or weight on file to calculate BMI.     03/15/2024    9:45 AM 02/25/2023   10:36 AM 02/12/2022   11:10 AM 01/23/2021   11:08 AM 01/18/2020   11:54 AM 10/04/2019   12:11 PM 08/17/2018    9:12 AM  Advanced Directives  Does Patient Have a Medical Advance Directive? No Yes Yes Yes Yes Yes Yes  Type of Science writer of [place];Living will Healthcare Power of [place];Living will Healthcare Power of [place];Living will Healthcare Power of [place];Living will   Copy of Healthcare Power of Attorney in Chart?  No - copy requested No - copy requested No - copy requested No - copy  requested No - copy requested     Current Medications (verified) Outpatient Encounter Medications as of 03/15/2024  Medication Sig   Accu-Chek FastClix Lancets MISC AS DIRECTED TWICE A DAY SUBCUTANEOUS 90 DAYS E11.9   ACCU-CHEK GUIDE test strip USE TO CHECK BLOOD SUGARS TWICE DAILY E11.69   Ascorbic Acid (VITAMIN C) 1000 MG tablet Take 1,000 mg by mouth daily.   aspirin  81 MG tablet Take 81 mg by mouth daily.   atorvastatin  (LIPITOR) 10 MG tablet TAKE 1 TABLET BY MOUTH EVERY DAY   calcium  citrate-vitamin D  (CITRACAL+D) 315-200 MG-UNIT tablet Take 1 tablet by mouth 2 (two) times daily.   Cyanocobalamin  (VITAMIN B 12 PO) Take 1,000 mg by mouth daily.   Dulaglutide  (TRULICITY ) 4.5 MG/0.5ML SOAJ Inject 4.5 mg as directed once a week.   FARXIGA  10 MG TABS tablet TAKE 1 TABLET BY MOUTH EVERY DAY BEFORE BREAKFAST   ferrous sulfate  325 (65 FE) MG tablet Take 1 tablet (325 mg total) by mouth 2 (two) times daily.   hydrocortisone  (ANUSOL -HC) 2.5 % rectal cream Place rectally 2 (two) times daily.   levothyroxine  (SYNTHROID ) 112 MCG tablet TAKE ONE TABLET MONDAY THROUGH SATURDAY   lisinopril  (ZESTRIL ) 2.5 MG tablet TAKE 1 TABLET BY MOUTH EVERY DAY   Multiple Vitamin (MULTIVITAMIN WITH MINERALS) TABS tablet Take 1 tablet by mouth daily.   mupirocin  ointment (BACTROBAN ) 2 % APPLY TO AFFECTED AREA TWICE A DAY   Omega-3 Fatty Acids (FISH OIL) 1000 MG CAPS Take 1 capsule by mouth daily.    Wheat Dextrin (BENEFIBER DRINK MIX PO) Take by mouth.  No facility-administered encounter medications on file as of 03/15/2024.    Allergies (verified) Patient has no known allergies.   History: Past Medical History:  Diagnosis Date   CAP (community acquired pneumonia)--treated 08/23/2014   Diabetes mellitus without complication (HCC)    DKA (diabetic ketoacidoses) 08/13/2014   DKA, type 2 (HCC) 08/13/2014   Hordeolum externum of left upper eyelid 12/14/2018   Hypertension    Obesity 03/05/2014   Pneumonia  08/13/2014   Rectal bleeding 02/24/2023   Thyroid  disease    Past Surgical History:  Procedure Laterality Date   CHOLECYSTECTOMY     Family History  Problem Relation Age of Onset   Diabetes Mellitus II Mother    Stroke Mother    Diabetes Mellitus II Sister    Hypertension Sister    Social History   Socioeconomic History   Marital status: Married    Spouse name: Not on file   Number of children: Not on file   Years of education: Not on file   Highest education level: Not on file  Occupational History   Occupation: retired  Tobacco Use   Smoking status: Never   Smokeless tobacco: Never  Vaping Use   Vaping status: Never Used  Substance and Sexual Activity   Alcohol use: No   Drug use: No   Sexual activity: Not Currently  Other Topics Concern   Not on file  Social History Narrative   Not on file   Social Drivers of Health   Financial Resource Strain: Low Risk  (03/15/2024)   Overall Financial Resource Strain (CARDIA)    Difficulty of Paying Living Expenses: Not hard at all  Food Insecurity: No Food Insecurity (03/15/2024)   Hunger Vital Sign    Worried About Running Out of Food in the Last Year: Never true    Ran Out of Food in the Last Year: Never true  Transportation Needs: No Transportation Needs (03/15/2024)   PRAPARE - Administrator, Civil Service (Medical): No    Lack of Transportation (Non-Medical): No  Physical Activity: Sufficiently Active (03/15/2024)   Exercise Vital Sign    Days of Exercise per Week: 7 days    Minutes of Exercise per Session: 30 min  Stress: No Stress Concern Present (03/15/2024)   [employer] of Occupational Health - Occupational Stress Questionnaire    Feeling of Stress : Not at all  Social Connections: Moderately Isolated (03/15/2024)   Social Connection and Isolation Panel [NHANES]    Frequency of Communication with Friends and Family: More than three times a week    Frequency of Social Gatherings with Friends and  Family: More than three times a week    Attends Religious Services: Never    Database administrator or Organizations: No    Attends Engineer, structural: Never    Marital Status: Married    Tobacco Counseling Counseling given: Not Answered    Clinical Intake:  Pre-visit preparation completed: Yes  Pain : No/denies pain     Nutritional Risks: None Diabetes: Yes CBG done?: No Did pt. bring in CBG monitor from home?: No  Lab Results  Component Value Date   HGBA1C 8.0 (H) 11/24/2023   HGBA1C 6.7 (H) 08/23/2023   HGBA1C 6.3 (H) 02/25/2023     How often do you need to have someone help you when you read instructions, pamphlets, or other written materials from your doctor or pharmacy?: 1 - Never  Interpreter Needed?: No  Information entered by ::  NAllen LPN   Activities of Daily Living     03/15/2024    9:37 AM  In your present state of health, do you have any difficulty performing the following activities:  Hearing? 0  Vision? 0  Difficulty concentrating or making decisions? 0  Walking or climbing stairs? 0  Dressing or bathing? 0  Doing errands, shopping? 0  Preparing Food and eating ? N  Using the Toilet? N  In the past six months, have you accidently leaked urine? N  Do you have problems with loss of bowel control? N  Managing your Medications? N  Managing your Finances? N  Housekeeping or managing your Housekeeping? N    Patient Care Team: Susanna Epley, FNP as PCP - General (General Practice) Frankie Israel, MD as Consulting Physician (Hematology) Pearson, Vallie J, [facility] (Inactive) (Pharmacist)  Indicate any recent Medical Services you may have received from other than [facility] providers in the past year (date may be approximate).     Assessment:   This is a routine wellness examination for [place].  Hearing/Vision screen Hearing Screening - Comments:: Denies hearing issues Vision Screening - Comments:: Regular eye exams, [facility]   Goals Addressed             This Visit's Progress    Patient Stated       03/15/2024, wants to get blood sugars under control       Depression Screen     03/15/2024    9:49 AM 08/23/2023   10:01 AM 02/25/2023   10:39 AM 11/24/2022   10:28 AM 02/12/2022   11:11 AM 01/23/2021   11:11 AM 12/10/2020    2:31 PM  PHQ 2/9 Scores  PHQ - 2 Score 0 0 0 0 0 0 2  PHQ- 9 Score 1 2     7     Fall Risk     03/15/2024    9:48 AM 02/25/2023   10:39 AM 11/24/2022   10:28 AM 08/17/2022   10:38 AM 05/18/2022   11:40 AM  Fall Risk   Falls in the past year? 0 0 0 0 0  Number falls in past yr: 0 0 0 0 0  Injury with Fall? 0 0 0 0 0  Risk for fall due to : Medication side effect Medication side effect No Fall Risks No Fall Risks No Fall Risks  Follow up Falls prevention discussed;Falls evaluation completed Falls prevention discussed;Education provided;Falls evaluation completed Falls evaluation completed Falls evaluation completed Falls evaluation completed    MEDICARE RISK AT HOME:  Medicare Risk at Home Any stairs in or around the home?: Yes If so, are there any without handrails?: Yes Home free of loose throw rugs in walkways, pet beds, electrical cords, etc?: Yes Adequate lighting in your home to reduce risk of falls?: Yes Life alert?: No Use of a cane, walker or w/c?: No Grab bars in the bathroom?: Yes Shower chair or bench in shower?: No Elevated toilet seat or a handicapped toilet?: No  TIMED UP AND GO:  Was the test performed?  No  Cognitive Function: 6CIT completed        03/15/2024    9:52 AM 02/25/2023   10:40 AM 02/12/2022   11:14 AM 01/23/2021   11:12 AM 01/18/2020   12:02 PM  6CIT Screen  What Year? 0 points 0 points 0 points 0 points 0 points  What month? 0 points 0 points 0 points 0 points 0 points  What time? 0  points 0 points 0 points 0 points 0 points  Count back from 20 0 points 0 points 0 points 0 points 0 points  Months in reverse 0 points 0 points 0 points 0  points 0 points  Repeat phrase 0 points 2 points 4 points 0 points 2 points  Total Score 0 points 2 points 4 points 0 points 2 points    Immunizations Immunization History  Administered Date(s) Administered   Fluad Quad(high Dose 65+) 08/11/2019, 09/13/2020, 08/12/2021, 09/03/2022   Fluad Trivalent(High Dose 65+) 08/23/2023   Influenza, High Dose Seasonal PF 08/08/2018   Influenza,inj,Quad PF,6+ Mos 08/16/2014   Influenza-Unspecified 08/08/2018, 08/11/2019   PFIZER Comirnaty (Gray Top)Covid-19 Tri-Sucrose Vaccine 06/27/2021, 08/23/2023   PFIZER(Purple Top)SARS-COV-2 Vaccination 12/21/2019, 01/11/2020, 10/23/2020, 06/27/2021, 09/18/2021   PNEUMOCOCCAL CONJUGATE-20 09/18/2021   Pfizer(Comirnaty )Fall Seasonal Vaccine 12 years and older 02/25/2023   Pneumococcal Conjugate-13 10/02/2020   Pneumococcal Polysaccharide-23 08/24/2019   Tdap 09/18/2019   Zoster Recombinant(Shingrix ) 05/18/2022, 08/17/2022    Screening Tests Health Maintenance  Topic Date Due   OPHTHALMOLOGY EXAM  07/04/2023   COVID-19 Vaccine (8 - 2024-25 season) 10/18/2023   HEMOGLOBIN A1C  05/23/2024   INFLUENZA VACCINE  06/16/2024   FOOT EXAM  08/22/2024   Diabetic kidney evaluation - eGFR measurement  11/23/2024   Diabetic kidney evaluation - Urine ACR  11/23/2024   MAMMOGRAM  12/19/2024   Medicare Annual Wellness (AWV)  03/15/2025   DEXA SCAN  12/19/2025   DTaP/Tdap/Td (2 - Td or Tdap) 09/17/2029   Pneumonia Vaccine 75+ Years old  Completed   Hepatitis C Screening  Completed   Zoster Vaccines- Shingrix   Completed   HPV VACCINES  Aged Out   Meningococcal B Vaccine  Aged Out   Colonoscopy  Discontinued    Health Maintenance  Health Maintenance Due  Topic Date Due   OPHTHALMOLOGY EXAM  07/04/2023   COVID-19 Vaccine (8 - 2024-25 season) 10/18/2023   Health Maintenance Items Addressed: Requesting eye report from [facility].  Additional Screening:  Vision Screening: Recommended annual ophthalmology  exams for early detection of glaucoma and other disorders of the eye.  Dental Screening: Recommended annual dental exams for proper oral hygiene  Community Resource Referral / Chronic Care Management: CRR required this visit?  No   CCM required this visit?  No     Plan:     I have personally reviewed and noted the following in the patient's chart:   Medical and social history Use of alcohol, tobacco or illicit drugs  Current medications and supplements including opioid prescriptions. Patient is not currently taking opioid prescriptions. Functional ability and status Nutritional status Physical activity Advanced directives List of other physicians Hospitalizations, surgeries, and ER visits in previous 12 months Vitals Screenings to include cognitive, depression, and falls Referrals and appointments  In addition, I have reviewed and discussed with patient certain preventive protocols, quality metrics, and best practice recommendations. A written personalized care plan for preventive services as well as general preventive health recommendations were provided to patient.     Areatha Beecham, LPN   [id number]/[id number]   After Visit Summary: (Pick Up) Due to this being a telephonic visit, with patients personalized plan was offered to patient and patient has requested to Pick up at office.  Notes: Nothing significant to report at this time.

## 2024-03-22 ENCOUNTER — Telehealth: Payer: Self-pay

## 2024-03-22 NOTE — Telephone Encounter (Signed)
 Called patient to inform their  farxiga - patient assistance  is ready for pick up, patient aware.

## 2024-03-23 ENCOUNTER — Encounter: Payer: Self-pay | Admitting: Nurse Practitioner

## 2024-03-23 ENCOUNTER — Ambulatory Visit: Payer: BC Managed Care – PPO | Admitting: Nurse Practitioner

## 2024-03-23 VITALS — BP 120/60 | HR 73 | Temp 98.4°F | Ht 64.0 in | Wt 159.2 lb

## 2024-03-23 DIAGNOSIS — E119 Type 2 diabetes mellitus without complications: Secondary | ICD-10-CM | POA: Diagnosis not present

## 2024-03-23 DIAGNOSIS — E782 Mixed hyperlipidemia: Secondary | ICD-10-CM

## 2024-03-23 DIAGNOSIS — E663 Overweight: Secondary | ICD-10-CM | POA: Diagnosis not present

## 2024-03-23 DIAGNOSIS — N1831 Chronic kidney disease, stage 3a: Secondary | ICD-10-CM

## 2024-03-23 DIAGNOSIS — E039 Hypothyroidism, unspecified: Secondary | ICD-10-CM | POA: Diagnosis not present

## 2024-03-23 DIAGNOSIS — E1122 Type 2 diabetes mellitus with diabetic chronic kidney disease: Secondary | ICD-10-CM | POA: Diagnosis not present

## 2024-03-23 DIAGNOSIS — Z6827 Body mass index (BMI) 27.0-27.9, adult: Secondary | ICD-10-CM | POA: Diagnosis not present

## 2024-03-23 DIAGNOSIS — Z2821 Immunization not carried out because of patient refusal: Secondary | ICD-10-CM

## 2024-03-23 DIAGNOSIS — I129 Hypertensive chronic kidney disease with stage 1 through stage 4 chronic kidney disease, or unspecified chronic kidney disease: Secondary | ICD-10-CM | POA: Diagnosis not present

## 2024-03-23 NOTE — Assessment & Plan Note (Signed)
Cholesterol levels are stable, continue statin tolerating well.

## 2024-03-23 NOTE — Assessment & Plan Note (Signed)
 Blood pressure is well controlled, continue current medications.

## 2024-03-23 NOTE — Assessment & Plan Note (Signed)
 She is taking her levothyroxine Mon - Sat, holds on Sunday. Will recheck thyroid levels today

## 2024-03-23 NOTE — Assessment & Plan Note (Signed)

## 2024-03-23 NOTE — Assessment & Plan Note (Signed)
 HgbA1c increased to 8 at last visit.  Continue focusing on healthy diet and important to get more activity.  Blood sugars are averaging 125-147, she has not increased her Trulicity  to 4.5 mg at this time, I would anticipate not much of an improvement with her A1c

## 2024-03-23 NOTE — Progress Notes (Addendum)
 Del Favia, CMA,acting as a Neurosurgeon for Susanna Epley, FNP.,have documented all relevant documentation on the behalf of Susanna Epley, FNP,as directed by  Susanna Epley, FNP while in the presence of Susanna Epley, FNP.  Subjective:  Patient ID: Catherine [name] , female    DOB: 1946/01/11 , 78 y.o.   MRN: [id number]  Chief Complaint  Patient presents with   Hypertension    Patient presents today for a bp and dm follow up, Patient reports compliance with medication. Patient denies any chest pain, SOB, or headaches. Patient has no concerns today.     HPI  Patient presents today for a bp and dm follow up, Patient reports compliance with medication. Patient denies any chest pain, SOB, or headaches. Patient has no concerns today. She is currently taking 3 mg of trulicity  and has not increased the dose to 4.5 mg weekly.She does not have to go back to Nephrology for one year and would like to have her kidney functions sent to them once we do today. She is taking her levothyroxine  M-Sat and holding on Sunday.  She had a visit by [employer].   patient presents for a follow-up visit for diabetes and hypertension management. She reports that her A1c recently increased at her previous visit to 8, prompting a dosage increase in her Trulicity  medication. The patient is hoping for her A1c to come down with this adjustment. She has been monitoring her blood sugars regularly and provided a log of her recent readings.  Regarding her hypertension, the patient states that her blood pressure is "pretty controlled," with the lowest reading being 128/129. She mentions a recent visit to Dr. Duwayne Ginsberg at the [facility], who did not change any of her medications and scheduled a follow-up at the end of the year.  The patient expresses concerns about her diet, particularly regarding the appropriateness of certain foods for her conditions. She has been eating various vegetables including broccoli, cauliflower, string  beans, peas, fennel beans, bell peppers, and onions. She also mentions occasionally eating potatoes, noting that she prepares them in healthier ways such as mashed or baked without excessive toppings. The patient acknowledges the need to include meats in her diet despite not caring for them much.  The patient reports adherence to her thyroid  medication, taking it Monday through Saturday but not on Sundays. She is awaiting lab results to determine if any adjustments are needed for this medication. Overall, the patient demonstrates an increased awareness of her dietary choices and their potential impact on her health conditions, expressing a commitment to "tweak" her meal plan accordingly.  Hypertension This is a chronic problem. The current episode started more than 1 year ago. The problem is controlled. Pertinent negatives include no anxiety or headaches. There are no associated agents to hypertension. Risk factors for coronary artery disease include diabetes mellitus, obesity and dyslipidemia. There are no compliance problems.  There is no history of chronic renal disease.  Diabetes She presents for her follow-up diabetic visit. She has type 2 diabetes mellitus. Her disease course has been stable. Pertinent negatives for hypoglycemia include no dizziness or headaches. There are no diabetic associated symptoms. There are no hypoglycemic complications. 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 Risk factors for coronary artery disease include sedentary lifestyle, hypertension, diabetes mellitus and dyslipidemia. Current diabetic treatment includes oral agent (dual therapy). She is compliant with treatment all of the time. She is following a diabetic diet. When asked about meal planning, she reported none. She  has not had a previous visit with a dietitian. She participates in exercise every other day. Her home blood glucose trend is decreasing steadily. (Blood sugar 128-147,  tolerating medications well however has not started the higher dose of trulicity ) An ACE inhibitor/angiotensin II receptor blocker is being taken. She does not see a podiatrist.Eye exam is not current (She will schedule an appt August 18th).     Past Medical History:  Diagnosis Date   CAP (community acquired pneumonia)--treated 08/23/2014   Diabetes mellitus without complication (HCC)    DKA (diabetic ketoacidoses) 08/13/2014   DKA, type 2 (HCC) 08/13/2014   Hordeolum externum of left upper eyelid 12/14/2018   Hypertension    Obesity 03/05/2014   Pneumonia 08/13/2014   Rectal bleeding 02/24/2023   Thyroid  disease      Family History  Problem Relation Age of Onset   Diabetes Mellitus II Mother    Stroke Mother    Diabetes Mellitus II Sister    Hypertension Sister      Current Outpatient Medications:    Accu-Chek FastClix Lancets MISC, AS DIRECTED TWICE A DAY SUBCUTANEOUS 90 DAYS E11.9, Disp: 204 each, Rfl: 4   ACCU-CHEK GUIDE test strip, USE TO CHECK BLOOD SUGARS TWICE DAILY E11.69, Disp: 100 strip, Rfl: 2   Ascorbic Acid (VITAMIN C) 1000 MG tablet, Take 1,000 mg by mouth daily., Disp: , Rfl:    aspirin  81 MG tablet, Take 81 mg by mouth daily., Disp: , Rfl:    atorvastatin  (LIPITOR) 10 MG tablet, TAKE 1 TABLET BY MOUTH EVERY DAY, Disp: 90 tablet, Rfl: 2   calcium  citrate-vitamin D  (CITRACAL+D) 315-200 MG-UNIT tablet, Take 1 tablet by mouth 2 (two) times daily., Disp: , Rfl:    Cyanocobalamin  (VITAMIN B 12 PO), Take 1,000 mg by mouth daily., Disp: , Rfl:    Dulaglutide  (TRULICITY ) 4.5 MG/0.5ML SOAJ, Inject 4.5 mg as directed once a week., Disp: 6 mL, Rfl: 3   FARXIGA  10 MG TABS tablet, TAKE 1 TABLET BY MOUTH EVERY DAY BEFORE BREAKFAST, Disp: 90 tablet, Rfl: 1   ferrous sulfate  325 (65 FE) MG tablet, Take 1 tablet (325 mg total) by mouth 2 (two) times daily., Disp: 180 tablet, Rfl: 1   hydrocortisone  (ANUSOL -HC) 2.5 % rectal cream, Place rectally 2 (two) times daily., Disp: 30 g,  Rfl: 2   levothyroxine  (SYNTHROID ) 112 MCG tablet, TAKE ONE TABLET MONDAY THROUGH SATURDAY, Disp: 78 tablet, Rfl: 1   lisinopril  (ZESTRIL ) 2.5 MG tablet, TAKE 1 TABLET BY MOUTH EVERY DAY, Disp: 90 tablet, Rfl: 1   Multiple Vitamin (MULTIVITAMIN WITH MINERALS) TABS tablet, Take 1 tablet by mouth daily., Disp: , Rfl:    mupirocin  ointment (BACTROBAN ) 2 %, APPLY TO AFFECTED AREA TWICE A DAY, Disp: 22 g, Rfl: 0   Omega-3 Fatty Acids (FISH OIL) 1000 MG CAPS, Take 1 capsule by mouth daily. , Disp: , Rfl:    Wheat Dextrin (BENEFIBER DRINK MIX PO), Take by mouth., Disp: , Rfl:    No Known Allergies   Review of Systems  Constitutional: Negative.   Respiratory: Negative.    Cardiovascular: Negative.   Neurological: Negative.  Negative for dizziness and headaches.  Psychiatric/Behavioral: Negative.    All other systems reviewed and are negative.    Today's Vitals   03/23/24 1126  BP: 120/60  Pulse: 73  Temp: 98.4 F (36.9 C)  TempSrc: Oral  Weight: 159 lb 3.2 oz (72.2 kg)  Height: 5\' 4"  (1.626 m)  PainSc: 0-No pain   Body mass  index is 27.33 kg/m.  Wt Readings from Last 3 Encounters:  03/23/24 159 lb 3.2 oz (72.2 kg)  11/24/23 161 lb 9.6 oz (73.3 kg)  08/23/23 159 lb 6.4 oz (72.3 kg)    The 10-year ASCVD risk score (Arnett DK, et al., 2019) is: 26.8%   Values used to calculate the score:     Age: 22 years     Sex: Female     Is Non-Hispanic African American: Yes     Diabetic: Yes     Tobacco smoker: No     Systolic Blood Pressure: 120 mmHg     Is BP treated: Yes     HDL Cholesterol: 64 mg/dL     Total Cholesterol: 150 mg/dL  Objective:  Physical Exam Vitals and nursing note reviewed.  Constitutional:      General: She is not in acute distress.    Appearance: Normal appearance.  Cardiovascular:     Rate and Rhythm: Normal rate and regular rhythm.     Pulses: Normal pulses.     Heart sounds: Normal heart sounds. No murmur heard. Pulmonary:     Effort: Pulmonary effort  is normal. No respiratory distress.     Breath sounds: Normal breath sounds. No wheezing.  Skin:    General: Skin is warm and dry.     Capillary Refill: Capillary refill takes less than 2 seconds.  Neurological:     General: No focal deficit present.     Mental Status: She is alert and oriented to person, place, and time.     Cranial Nerves: No cranial nerve deficit.     Motor: No weakness.  Psychiatric:        Mood and Affect: Mood normal.        Behavior: Behavior normal.        Thought Content: Thought content normal.        Judgment: Judgment normal.      Assessment And Plan:  Mixed hyperlipidemia Assessment & Plan: Cholesterol levels are stable, continue statin tolerating well  Orders: -     Lipid panel  Type 2 diabetes mellitus with stage 3a chronic kidney disease, without long-term current use of insulin  (HCC) Assessment & Plan: HgbA1c increased to 8 at last visit.  Continue focusing on healthy diet and important to get more activity.  Blood sugars are averaging 125-147, she has not increased her Trulicity  to 4.5 mg at this time, I would anticipate not much of an improvement with her A1c  Orders: -     Hemoglobin A1c -     BMP8+eGFR  Benign hypertension with stage 3a chronic kidney disease (HCC) Assessment & Plan: Blood pressure is well controlled, continue current medications.    Acquired hypothyroidism Assessment & Plan: She is taking her levothyroxine  Mon - Sat, holds on Sunday. Will recheck thyroid  levels today   COVID-19 vaccination declined Assessment & Plan: Declines covid 19 vaccine. Discussed risk of covid 38 and if she changes her mind about the vaccine to call the office. Education has been provided regarding the importance of this vaccine but patient still declined. Advised may receive this vaccine at local pharmacy or Health Dept.or vaccine clinic. Aware to provide a copy of the vaccination record if obtained from local pharmacy or Health Dept.   Encouraged to take multivitamin, vitamin d , vitamin c and zinc to increase immune system. Aware can call office if would like to have vaccine here at office. Verbalized acceptance and understanding.    Overweight with  body mass index (BMI) of 27 to 27.9 in adult    Return for controlled DM check 4 months.  Patient was given opportunity to ask questions. Patient verbalized understanding of the plan and was able to repeat key elements of the plan. All questions were answered to their satisfaction.    Inge Mangle, FNP, have reviewed all documentation for this visit. The documentation on 03/23/24 for the exam, diagnosis, procedures, and orders are all accurate and complete.   IF YOU HAVE BEEN REFERRED TO A SPECIALIST, IT MAY TAKE 1-2 WEEKS TO SCHEDULE/PROCESS THE REFERRAL. IF YOU HAVE NOT HEARD FROM US /SPECIALIST IN TWO WEEKS, PLEASE GIVE US  A CALL AT [phone] X [phone].

## 2024-03-24 LAB — LIPID PANEL
Chol/HDL Ratio: 2.3 ratio (ref 0.0–4.4)
Cholesterol, Total: 150 mg/dL (ref 100–199)
HDL: 64 mg/dL (ref >39)
LDL Chol Calc (NIH): 73 mg/dL (ref 0–99)
Triglycerides: 64 mg/dL (ref 0–149)
VLDL Cholesterol Cal: 13 mg/dL (ref 5–40)

## 2024-03-24 LAB — BMP8+EGFR
BUN/Creatinine Ratio: 14 (ref 12–28)
BUN: 22 mg/dL (ref 8–27)
CO2: 23 mmol/L (ref 20–29)
Calcium: 10.3 mg/dL (ref 8.7–10.3)
Chloride: 100 mmol/L (ref 96–106)
Creatinine, Ser: 1.54 mg/dL — ABNORMAL HIGH (ref 0.57–1.00)
Glucose: 126 mg/dL — ABNORMAL HIGH (ref 70–99)
Potassium: 4.2 mmol/L (ref 3.5–5.2)
Sodium: 139 mmol/L (ref 134–144)
eGFR: 35 mL/min/{1.73_m2} — ABNORMAL LOW (ref >59)

## 2024-03-24 LAB — HEMOGLOBIN A1C
Est. average glucose Bld gHb Est-mCnc: 157 mg/dL
Hgb A1c MFr Bld: 7.1 % — ABNORMAL HIGH (ref 4.8–5.6)

## 2024-04-02 ENCOUNTER — Ambulatory Visit: Payer: Self-pay | Admitting: Nurse Practitioner

## 2024-04-05 NOTE — Progress Notes (Signed)
 done

## 2024-05-25 ENCOUNTER — Telehealth: Payer: Self-pay

## 2024-05-25 NOTE — Telephone Encounter (Signed)
 Called patient to inform their  farxiga  and trulicity  patient assistance  is ready for pick up, patient aware, LVM. Patient made aware they have 1 week to pick up patient assistance.

## 2024-07-24 NOTE — Progress Notes (Addendum)
 [name], CMA,acting as a neurosurgeon for Gaines Ada, FNP.,have documented all relevant documentation on the behalf of Gaines Ada, FNP,as directed by  Gaines Ada, FNP while in the presence of Gaines Ada, FNP.  Subjective:  Patient ID: Catherine [name] , female    DOB: [birth year] , 78 y.o.   MRN: [id number]  Chief Complaint  Patient presents with   Hypertension    Patient presents today for a bp and dm follow up, Patient reports compliance with medication. Patient denies any chest pain, SOB, or headaches. Patient has no concerns today.     Discussed the use of AI scribe software for clinical note transcription with the patient, who gave verbal consent to proceed.  History of Present Illness Catherine [name] is a 77 year old female with hypertension and diabetes who presents for a follow-up visit.  Her blood pressure readings at home have varied, with the lowest being 95/51 and the highest 122/59. Blood pressure tends to stabilize when she sits still and does not engage in physical activity. She is seeking refill authorizations for her medications.  Her blood sugar levels have been higher than desired, with lows around 118 and highs up to 167. She monitors her blood sugar levels regularly, especially after meals, and notes that her morning blood sugar levels are higher if she has a snack before bed. Her typical snack is a tablespoon of peanut butter with water . She reports that she is drinking more water  and is mindful of her diet.  She reports a weight of 146 pounds this morning, down from 151 pounds previously. She has not yet visited the eye doctor or dentist but plans to do so soon. She received her flu and COVID vaccines today and reports that with many home care people coming in and out, she wanted to protect herself.  Her husband has been discharged home after refusing further rehabilitation and is now unable to walk, requiring home care assistance. She is managing the increased  number of people coming into her home for his care. No dizziness or swelling in her legs, but she reports occasional leg cramps that improve with hydration.   Hypertension This is a chronic problem. The current episode started more than 1 year ago. The problem has been gradually improving since onset. The problem is controlled. Pertinent negatives include no anxiety or headaches. There are no associated agents to hypertension. Risk factors for coronary artery disease include diabetes mellitus, obesity and dyslipidemia. The current treatment provides no improvement. There are no compliance problems.  Hypertensive end-organ damage includes kidney disease. There is no history of chronic renal disease.  Diabetes She presents for her follow-up diabetic visit. She has type 2 diabetes mellitus. Her disease course has been stable. Pertinent negatives for hypoglycemia include no dizziness or headaches. There are no diabetic associated symptoms. There are no hypoglycemic complications. Diabetic complications include heart disease and nephropathy. Risk factors for coronary artery disease include sedentary lifestyle, hypertension, diabetes mellitus and dyslipidemia. Current diabetic treatment includes oral agent (dual therapy). She is following a diabetic diet. When asked about meal planning, she reported none. She has not had a previous visit with a dietitian. She participates in exercise every other day. Her home blood glucose trend is decreasing steadily. (Blood sugar 118-167, tolerating medications well - currently on Trulicity  4 mg weekly) An ACE inhibitor/angiotensin II receptor blocker is being taken. She does not see a podiatrist.Eye exam is not current (She will schedule an appt August 18th).  Past Medical History:  Diagnosis Date   CAP (community acquired pneumonia)--treated 08/23/2014   Constipation 02/24/2023   Diabetes mellitus without complication (HCC)    DKA (diabetic ketoacidoses) 08/13/2014    DKA, type 2 (HCC) 08/13/2014   Hordeolum externum of left upper eyelid 12/14/2018   Hypertension    Obesity 03/05/2014   Pneumonia 08/13/2014   Rectal bleeding 02/24/2023   Thyroid  disease      Family History  Problem Relation Age of Onset   Diabetes Mellitus II Mother    Stroke Mother    Diabetes Mellitus II Sister    Hypertension Sister      Current Outpatient Medications:    Accu-Chek FastClix Lancets MISC, AS DIRECTED TWICE A DAY SUBCUTANEOUS 90 DAYS E11.9, Disp: 204 each, Rfl: 4   ACCU-CHEK GUIDE test strip, USE TO CHECK BLOOD SUGARS TWICE DAILY E11.69, Disp: 100 strip, Rfl: 2   Ascorbic Acid (VITAMIN C) 1000 MG tablet, Take 1,000 mg by mouth daily., Disp: , Rfl:    aspirin  81 MG tablet, Take 81 mg by mouth daily., Disp: , Rfl:    atorvastatin  (LIPITOR) 10 MG tablet, TAKE 1 TABLET BY MOUTH EVERY DAY, Disp: 90 tablet, Rfl: 2   calcium  citrate-vitamin D  (CITRACAL+D) 315-200 MG-UNIT tablet, Take 1 tablet by mouth 2 (two) times daily., Disp: , Rfl:    Cyanocobalamin  (VITAMIN B 12 PO), Take 1,000 mg by mouth daily., Disp: , Rfl:    Dulaglutide  (TRULICITY ) 4.5 MG/0.5ML SOAJ, Inject 4.5 mg as directed once a week., Disp: 6 mL, Rfl: 3   FARXIGA  10 MG TABS tablet, TAKE 1 TABLET BY MOUTH EVERY DAY BEFORE BREAKFAST, Disp: 90 tablet, Rfl: 1   ferrous sulfate  325 (65 FE) MG tablet, Take 1 tablet (325 mg total) by mouth 2 (two) times daily., Disp: 180 tablet, Rfl: 1   levothyroxine  (SYNTHROID ) 112 MCG tablet, TAKE ONE TABLET MONDAY THROUGH SATURDAY, Disp: 78 tablet, Rfl: 1   lisinopril  (ZESTRIL ) 2.5 MG tablet, TAKE 1 TABLET BY MOUTH EVERY DAY, Disp: 90 tablet, Rfl: 1   Multiple Vitamin (MULTIVITAMIN WITH MINERALS) TABS tablet, Take 1 tablet by mouth daily., Disp: , Rfl:    mupirocin  ointment (BACTROBAN ) 2 %, APPLY TO AFFECTED AREA TWICE A DAY, Disp: 22 g, Rfl: 0   Omega-3 Fatty Acids (FISH OIL) 1000 MG CAPS, Take 1 capsule by mouth daily. , Disp: , Rfl:    Wheat Dextrin (BENEFIBER DRINK MIX  PO), Take by mouth., Disp: , Rfl:    hydrocortisone  (ANUSOL -HC) 2.5 % rectal cream, Place rectally 2 (two) times daily., Disp: 30 g, Rfl: 2   No Known Allergies   Review of Systems  Constitutional: Negative.   Respiratory: Negative.    Cardiovascular: Negative.   Neurological: Negative.  Negative for dizziness and headaches.  Psychiatric/Behavioral: Negative.    All other systems reviewed and are negative.    Today's Vitals   07/25/24 1129  BP: 120/80  Pulse: 68  Temp: 98.5 F (36.9 C)  TempSrc: Oral  Weight: 151 lb 9.6 oz (68.8 kg)  Height: 5' 4 (1.626 m)  PainSc: 0-No pain   Body mass index is 26.02 kg/m.  Wt Readings from Last 3 Encounters:  07/25/24 151 lb 9.6 oz (68.8 kg)  03/23/24 159 lb 3.2 oz (72.2 kg)  11/24/23 161 lb 9.6 oz (73.3 kg)    Objective:  Physical Exam Vitals and nursing note reviewed.  Constitutional:      General: She is not in acute distress.    Appearance:  Normal appearance.  Cardiovascular:     Rate and Rhythm: Normal rate and regular rhythm.     Pulses: Normal pulses.     Heart sounds: Normal heart sounds. No murmur heard. Pulmonary:     Effort: Pulmonary effort is normal. No respiratory distress.     Breath sounds: Normal breath sounds. No wheezing.  Skin:    General: Skin is warm and dry.     Capillary Refill: Capillary refill takes less than 2 seconds.  Neurological:     General: No focal deficit present.     Mental Status: She is alert and oriented to person, place, and time.     Cranial Nerves: No cranial nerve deficit.     Motor: No weakness.  Psychiatric:        Mood and Affect: Mood normal.        Behavior: Behavior normal.        Thought Content: Thought content normal.        Judgment: Judgment normal.        Assessment And Plan:  Benign hypertension with stage 3a chronic kidney disease (HCC) Assessment & Plan: Blood pressure well-controlled. Home readings 95/51 to 122/59 mmHg. Office reading 120/80 mmHg.   Type  2 diabetes mellitus with stage 3a chronic kidney disease, without long-term current use of insulin  (HCC) Assessment & Plan: Blood glucose 118-167 mg/dL. Improved A1c. Bedtime snack may elevate morning glucose. Weight 146 lbs, BMI 26.  - Check A1c today. - Advise reducing bedtime snack to lower morning glucose levels. - Continue current diabetes management plan unless A1c indicates need for adjustment.  Orders: -     Hemoglobin A1c  Mixed hyperlipidemia Assessment & Plan: Cholesterol levels are stable, continue statin tolerating well  Orders: -     Lipid panel -     BMP8+eGFR  COVID-19 vaccine administered -     Pfizer Comirnaty  Covid-19 Vaccine 66yrs & older  Need for influenza vaccination Assessment & Plan: Influenza vaccine administered Encouraged to take Tylenol  as needed for fever or muscle aches.   Orders: -     Flu vaccine HIGH DOSE PF(Fluzone Trivalent)  Acquired hypothyroidism Assessment & Plan: She is taking her levothyroxine  Mon - Sat, holds on Sunday. Will recheck thyroid  levels today  Orders: -     TSH + free T4  Other orders -     Hydrocortisone  (Perianal); Place rectally 2 (two) times daily.  Dispense: 30 g; Refill: 2     No follow-ups on file.  Patient was given opportunity to ask questions. Patient verbalized understanding of the plan and was able to repeat key elements of the plan. All questions were answered to their satisfaction.   LILLETTE Gaines Ada, FNP, have reviewed all documentation for this visit. The documentation on 07/25/24 for the exam, diagnosis, procedures, and orders are all accurate and complete.   IF YOU HAVE BEEN REFERRED TO A SPECIALIST, IT MAY TAKE 1-2 WEEKS TO SCHEDULE/PROCESS THE REFERRAL. IF YOU HAVE NOT HEARD FROM US /SPECIALIST IN TWO WEEKS, PLEASE GIVE US  A CALL AT [phone].

## 2024-07-25 ENCOUNTER — Ambulatory Visit (INDEPENDENT_AMBULATORY_CARE_PROVIDER_SITE_OTHER): Admitting: Nurse Practitioner

## 2024-07-25 ENCOUNTER — Encounter: Payer: Self-pay | Admitting: Nurse Practitioner

## 2024-07-25 VITALS — BP 120/80 | HR 68 | Temp 98.5°F | Ht 64.0 in | Wt 151.6 lb

## 2024-07-25 DIAGNOSIS — E1122 Type 2 diabetes mellitus with diabetic chronic kidney disease: Secondary | ICD-10-CM | POA: Diagnosis not present

## 2024-07-25 DIAGNOSIS — E039 Hypothyroidism, unspecified: Secondary | ICD-10-CM | POA: Diagnosis not present

## 2024-07-25 DIAGNOSIS — E782 Mixed hyperlipidemia: Secondary | ICD-10-CM | POA: Diagnosis not present

## 2024-07-25 DIAGNOSIS — I129 Hypertensive chronic kidney disease with stage 1 through stage 4 chronic kidney disease, or unspecified chronic kidney disease: Secondary | ICD-10-CM

## 2024-07-25 DIAGNOSIS — N1831 Chronic kidney disease, stage 3a: Secondary | ICD-10-CM | POA: Diagnosis not present

## 2024-07-25 DIAGNOSIS — Z23 Encounter for immunization: Secondary | ICD-10-CM

## 2024-07-25 MED ORDER — HYDROCORTISONE (PERIANAL) 2.5 % EX CREA: TOPICAL_CREAM | Freq: Two times a day (BID) | CUTANEOUS | 2 refills | Status: AC

## 2024-07-26 LAB — LIPID PANEL
Chol/HDL Ratio: 2.4 ratio (ref 0.0–4.4)
Cholesterol, Total: 150 mg/dL (ref 100–199)
HDL: 62 mg/dL (ref >39)
LDL Chol Calc (NIH): 72 mg/dL (ref 0–99)
Triglycerides: 88 mg/dL (ref 0–149)
VLDL Cholesterol Cal: 16 mg/dL (ref 5–40)

## 2024-07-26 LAB — BMP8+EGFR
BUN/Creatinine Ratio: 14 (ref 12–28)
BUN: 18 mg/dL (ref 8–27)
CO2: 22 mmol/L (ref 20–29)
Calcium: 9.9 mg/dL (ref 8.7–10.3)
Chloride: 101 mmol/L (ref 96–106)
Creatinine, Ser: 1.26 mg/dL — ABNORMAL HIGH (ref 0.57–1.00)
Glucose: 98 mg/dL (ref 70–99)
Potassium: 4.5 mmol/L (ref 3.5–5.2)
Sodium: 140 mmol/L (ref 134–144)
eGFR: 44 mL/min/1.73 — ABNORMAL LOW (ref >59)

## 2024-07-26 LAB — HEMOGLOBIN A1C
Est. average glucose Bld gHb Est-mCnc: 140 mg/dL
Hgb A1c MFr Bld: 6.5 % — ABNORMAL HIGH (ref 4.8–5.6)

## 2024-07-26 LAB — TSH+FREE T4
Free T4: 1.55 ng/dL (ref 0.82–1.77)
TSH: 1.3 u[IU]/mL (ref 0.450–4.500)

## 2024-08-06 ENCOUNTER — Ambulatory Visit: Payer: Self-pay | Admitting: Nurse Practitioner

## 2024-08-06 NOTE — Assessment & Plan Note (Signed)
Cholesterol levels are stable, continue statin tolerating well.

## 2024-08-06 NOTE — Assessment & Plan Note (Signed)
 She is taking her levothyroxine Mon - Sat, holds on Sunday. Will recheck thyroid levels today

## 2024-08-06 NOTE — Assessment & Plan Note (Signed)
 Blood glucose 118-167 mg/dL. Improved A1c. Bedtime snack may elevate morning glucose. Weight 146 lbs, BMI 26.  - Check A1c today. - Advise reducing bedtime snack to lower morning glucose levels. - Continue current diabetes management plan unless A1c indicates need for adjustment.

## 2024-08-06 NOTE — Assessment & Plan Note (Signed)
 Blood pressure well-controlled. Home readings 95/51 to 122/59 mmHg. Office reading 120/80 mmHg.

## 2024-08-06 NOTE — Assessment & Plan Note (Signed)
 Influenza vaccine administered Encouraged to take Tylenol as needed for fever or muscle aches.

## 2024-08-22 ENCOUNTER — Other Ambulatory Visit: Payer: Self-pay | Admitting: Nurse Practitioner

## 2024-08-22 DIAGNOSIS — I10 Essential (primary) hypertension: Secondary | ICD-10-CM

## 2024-08-23 ENCOUNTER — Telehealth: Payer: Self-pay

## 2024-08-23 NOTE — Telephone Encounter (Signed)
 PAP: Patient assistance application for Farxiga  through [employer] (AZ&Me) has been mailed to pt's home address on file. Provider portion of application will be faxed to provider's office.For renewal 2026.

## 2024-08-24 NOTE — Telephone Encounter (Signed)
 Received Prov. Portion Pap application for Farxiga  (AZ&ME)

## 2024-09-13 ENCOUNTER — Other Ambulatory Visit: Payer: Self-pay | Admitting: Nurse Practitioner

## 2024-09-13 DIAGNOSIS — E039 Hypothyroidism, unspecified: Secondary | ICD-10-CM

## 2024-10-11 ENCOUNTER — Telehealth: Payer: Self-pay

## 2024-10-11 NOTE — Telephone Encounter (Signed)
 PAP: Patient assistance application for Trulicity through [employer] has been mailed to pt's home address on file. Provider portion of application will be faxed to provider's office.

## 2024-12-20 ENCOUNTER — Encounter: Payer: Self-pay | Admitting: Pharmacist

## 2024-12-20 NOTE — Progress Notes (Signed)
" ° °  12/20/2024 Name: Catherine [name] MRN: [id number] DOB: 08-25-1946  Chief Complaint  Patient presents with   Medication Assistance    Trulicity   Provider patient assistance forms were completed and the Provider signature obtained for [employer] through [employer]. Forms were faxed back to [name], CPhT for processing and scanning.   Lab Results  Component Value Date   HGBA1C 6.5 (H) 07/25/2024   HGBA1C 7.1 (H) 03/23/2024   HGBA1C 8.0 (H) 11/24/2023    Cassius DOROTHA Brought, PharmD, BCACP Clinical Pharmacist [phone]  "

## 2025-04-18 ENCOUNTER — Ambulatory Visit: Payer: Self-pay
